# Patient Record
Sex: Female | Born: 1960 | Race: White | Hispanic: No | Marital: Married | Smoking: Never smoker
Health system: Southern US, Community
[De-identification: ages and names within clinical notes are randomized; demographics above are authoritative.]

## PROBLEM LIST (undated history)

## (undated) DIAGNOSIS — G43909 Migraine, unspecified, not intractable, without status migrainosus: Secondary | ICD-10-CM

## (undated) DIAGNOSIS — M1990 Unspecified osteoarthritis, unspecified site: Secondary | ICD-10-CM

## (undated) DIAGNOSIS — T8859XA Other complications of anesthesia, initial encounter: Secondary | ICD-10-CM

## (undated) DIAGNOSIS — E669 Obesity, unspecified: Secondary | ICD-10-CM

## (undated) DIAGNOSIS — Z8489 Family history of other specified conditions: Secondary | ICD-10-CM

## (undated) DIAGNOSIS — D649 Anemia, unspecified: Secondary | ICD-10-CM

## (undated) DIAGNOSIS — J40 Bronchitis, not specified as acute or chronic: Secondary | ICD-10-CM

## (undated) DIAGNOSIS — N83209 Unspecified ovarian cyst, unspecified side: Secondary | ICD-10-CM

## (undated) DIAGNOSIS — K449 Diaphragmatic hernia without obstruction or gangrene: Secondary | ICD-10-CM

## (undated) DIAGNOSIS — T4145XA Adverse effect of unspecified anesthetic, initial encounter: Secondary | ICD-10-CM

## (undated) HISTORY — PX: OTHER SURGICAL HISTORY: SHX169

## (undated) HISTORY — DX: Anemia, unspecified: D64.9

## (undated) HISTORY — DX: Diaphragmatic hernia without obstruction or gangrene: K44.9

## (undated) HISTORY — DX: Migraine, unspecified, not intractable, without status migrainosus: G43.909

## (undated) HISTORY — PX: ABDOMINOPLASTY/PANNICULECTOMY: SHX5578

## (undated) HISTORY — DX: Bronchitis, not specified as acute or chronic: J40

## (undated) HISTORY — DX: Unspecified osteoarthritis, unspecified site: M19.90

## (undated) HISTORY — DX: Unspecified ovarian cyst, unspecified side: N83.209

## (undated) HISTORY — DX: Obesity, unspecified: E66.9

---

## 1996-10-18 HISTORY — PX: BREAST SURGERY: SHX581

## 1996-10-18 HISTORY — PX: REDUCTION MAMMAPLASTY: SUR839

## 1997-10-18 HISTORY — PX: NASAL SINUS SURGERY: SHX719

## 2007-08-25 ENCOUNTER — Ambulatory Visit: Payer: Self-pay | Admitting: Obstetrics and Gynecology

## 2007-08-25 ENCOUNTER — Encounter: Payer: Self-pay | Admitting: Obstetrics and Gynecology

## 2007-08-31 ENCOUNTER — Encounter: Admission: RE | Admit: 2007-08-31 | Discharge: 2007-08-31 | Payer: Self-pay | Admitting: Obstetrics & Gynecology

## 2008-08-15 ENCOUNTER — Ambulatory Visit: Payer: Self-pay | Admitting: Family Medicine

## 2008-08-15 DIAGNOSIS — M069 Rheumatoid arthritis, unspecified: Secondary | ICD-10-CM | POA: Insufficient documentation

## 2009-01-20 ENCOUNTER — Ambulatory Visit: Payer: Self-pay | Admitting: Family Medicine

## 2009-01-20 LAB — CONVERTED CEMR LAB: Rapid Strep: POSITIVE

## 2009-06-27 ENCOUNTER — Ambulatory Visit: Payer: Self-pay | Admitting: Family Medicine

## 2009-06-27 DIAGNOSIS — J069 Acute upper respiratory infection, unspecified: Secondary | ICD-10-CM | POA: Insufficient documentation

## 2009-10-05 ENCOUNTER — Ambulatory Visit: Payer: Self-pay | Admitting: Internal Medicine

## 2009-10-05 DIAGNOSIS — H6690 Otitis media, unspecified, unspecified ear: Secondary | ICD-10-CM | POA: Insufficient documentation

## 2009-11-21 ENCOUNTER — Ambulatory Visit (HOSPITAL_COMMUNITY): Admission: RE | Admit: 2009-11-21 | Discharge: 2009-11-21 | Payer: Self-pay | Admitting: Surgery

## 2009-12-05 ENCOUNTER — Ambulatory Visit (HOSPITAL_COMMUNITY): Admission: RE | Admit: 2009-12-05 | Discharge: 2009-12-05 | Payer: Self-pay | Admitting: Surgery

## 2009-12-15 ENCOUNTER — Encounter: Admission: RE | Admit: 2009-12-15 | Discharge: 2010-03-15 | Payer: Self-pay | Admitting: Surgery

## 2010-02-15 HISTORY — PX: ROUX-EN-Y PROCEDURE: SUR1287

## 2010-02-16 ENCOUNTER — Inpatient Hospital Stay (HOSPITAL_COMMUNITY): Admission: RE | Admit: 2010-02-16 | Discharge: 2010-02-19 | Payer: Self-pay | Admitting: Surgery

## 2010-02-17 ENCOUNTER — Ambulatory Visit: Payer: Self-pay | Admitting: Vascular Surgery

## 2010-02-17 ENCOUNTER — Encounter (INDEPENDENT_AMBULATORY_CARE_PROVIDER_SITE_OTHER): Payer: Self-pay | Admitting: Surgery

## 2010-03-31 ENCOUNTER — Encounter: Admission: RE | Admit: 2010-03-31 | Discharge: 2010-06-29 | Payer: Self-pay | Admitting: Surgery

## 2010-08-13 ENCOUNTER — Encounter
Admission: RE | Admit: 2010-08-13 | Discharge: 2010-08-13 | Payer: Self-pay | Source: Home / Self Care | Attending: Surgery | Admitting: Surgery

## 2010-10-18 HISTORY — PX: OTHER SURGICAL HISTORY: SHX169

## 2011-01-05 LAB — DIFFERENTIAL
Basophils Absolute: 0 10*3/uL (ref 0.0–0.1)
Basophils Relative: 0 % (ref 0–1)
Basophils Relative: 0 % (ref 0–1)
Basophils Relative: 1 % (ref 0–1)
Eosinophils Absolute: 0 10*3/uL (ref 0.0–0.7)
Eosinophils Absolute: 0.1 10*3/uL (ref 0.0–0.7)
Eosinophils Relative: 0 % (ref 0–5)
Eosinophils Relative: 0 % (ref 0–5)
Lymphs Abs: 1.5 10*3/uL (ref 0.7–4.0)
Lymphs Abs: 2.5 10*3/uL (ref 0.7–4.0)
Monocytes Absolute: 0.9 10*3/uL (ref 0.1–1.0)
Monocytes Absolute: 0.5 10*3/uL (ref 0.1–1.0)
Monocytes Relative: 7 % (ref 3–12)
Neutrophils Relative %: 76 % (ref 43–77)

## 2011-01-05 LAB — CBC
HCT: 37 % (ref 36.0–46.0)
HCT: 37.7 % (ref 36.0–46.0)
Hemoglobin: 11.9 g/dL — ABNORMAL LOW (ref 12.0–15.0)
Hemoglobin: 12.3 g/dL (ref 12.0–15.0)
MCHC: 32.2 g/dL (ref 30.0–36.0)
MCV: 84.4 fL (ref 78.0–100.0)
Platelets: 275 10*3/uL (ref 150–400)
Platelets: 295 10*3/uL (ref 150–400)
RBC: 4.39 MIL/uL (ref 3.87–5.11)
RDW: 16.6 % — ABNORMAL HIGH (ref 11.5–15.5)
RDW: 17.5 % — ABNORMAL HIGH (ref 11.5–15.5)
WBC: 10.1 10*3/uL (ref 4.0–10.5)
WBC: 9.4 10*3/uL (ref 4.0–10.5)
WBC: 7.9 10*3/uL (ref 4.0–10.5)

## 2011-01-05 LAB — COMPREHENSIVE METABOLIC PANEL
ALT: 22 U/L (ref 0–35)
CO2: 30 mEq/L (ref 19–32)
Calcium: 9.2 mg/dL (ref 8.4–10.5)
Chloride: 104 mEq/L (ref 96–112)
Creatinine, Ser: 0.72 mg/dL (ref 0.4–1.2)
Glucose, Bld: 93 mg/dL (ref 70–99)
Potassium: 4.2 mEq/L (ref 3.5–5.1)
Total Bilirubin: 0.5 mg/dL (ref 0.3–1.2)

## 2011-01-05 LAB — PREGNANCY, URINE: Preg Test, Ur: NEGATIVE

## 2011-01-05 LAB — HEMOGLOBIN AND HEMATOCRIT, BLOOD
HCT: 37.6 % (ref 36.0–46.0)
Hemoglobin: 11.9 g/dL — ABNORMAL LOW (ref 12.0–15.0)

## 2011-01-05 LAB — BASIC METABOLIC PANEL
Calcium: 8.4 mg/dL (ref 8.4–10.5)
Sodium: 138 mEq/L (ref 135–145)

## 2011-02-23 ENCOUNTER — Other Ambulatory Visit: Payer: Self-pay | Admitting: Obstetrics & Gynecology

## 2011-02-23 ENCOUNTER — Ambulatory Visit (INDEPENDENT_AMBULATORY_CARE_PROVIDER_SITE_OTHER): Payer: 59 | Admitting: Obstetrics & Gynecology

## 2011-02-23 DIAGNOSIS — N949 Unspecified condition associated with female genital organs and menstrual cycle: Secondary | ICD-10-CM

## 2011-02-23 DIAGNOSIS — N920 Excessive and frequent menstruation with regular cycle: Secondary | ICD-10-CM

## 2011-02-23 DIAGNOSIS — N938 Other specified abnormal uterine and vaginal bleeding: Secondary | ICD-10-CM

## 2011-02-26 ENCOUNTER — Ambulatory Visit
Admission: RE | Admit: 2011-02-26 | Discharge: 2011-02-26 | Disposition: A | Discharge status: Home / Self Care | Payer: 59 | Source: Ambulatory Visit | Attending: Obstetrics & Gynecology | Admitting: Obstetrics & Gynecology

## 2011-02-26 ENCOUNTER — Other Ambulatory Visit: Payer: Self-pay | Admitting: Obstetrics & Gynecology

## 2011-02-26 DIAGNOSIS — N6320 Unspecified lump in the left breast, unspecified quadrant: Secondary | ICD-10-CM

## 2011-02-26 DIAGNOSIS — Z1231 Encounter for screening mammogram for malignant neoplasm of breast: Secondary | ICD-10-CM

## 2011-02-26 DIAGNOSIS — N920 Excessive and frequent menstruation with regular cycle: Secondary | ICD-10-CM

## 2011-02-26 DIAGNOSIS — N938 Other specified abnormal uterine and vaginal bleeding: Secondary | ICD-10-CM

## 2011-02-26 NOTE — Assessment & Plan Note (Signed)
NAME:  Rose Long, Rose Long NO.:  [id number]  MEDICAL RECORD NO.:  [id number]           PATIENT TYPE:  LOCATION:  [facility] [place]           FACILITY:  PHYSICIAN:  Allie Bossier, MD        DATE OF BIRTH:  [birth year]  DATE OF SERVICE:  02/23/2011                                 CLINIC NOTE  [name] is a 50 year old single white gravida 0 who comes here with the complaint of dysfunctional uterine bleeding/menorrhagia.  She says that in general her periods last 7 days.  They are so heavy that she has to wear a pad and tampon at the same time and has to change her pad and her tampon every 1 hour.  Her hemoglobin has consistently been low, most recently it was 8.8.  She says that for the last 6 weeks, however, her period has been nonstop.  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  She has a history of osteoarthritis and anemia as mentioned above.  REVIEW OF SYSTEMS:  She has had "night sweats" for years.  She is dating a man for the last 4 months that has had a vasectomy and they are certainly sexually active.  Her Pap smear was last done in November 2008 and was negative.  She has never had any abnormal Pap smears.  Her last mammogram was in 2008.  FAMILY HISTORY:  Negative for breast, GYN, and colon malignancies.  PAST SURGICAL HISTORY:  As above plus nasal surgery and bilateral breast reduction.  SOCIAL HISTORY:  She drinks alcohol on a very rare occasion.  ALLERGIES:  No latex allergies.  No drug allergies.  MEDICATIONS: 1. Iron 325 mg b.i.d. 2. Multivitamin with vitamin D. 3. Calcium citrate. 4. Vitamin B12. 5. Tylenol on a p.r.n. basis.  PHYSICAL EXAMINATION:  HEART:  Regular rate and rhythm. LUNGS:  Clear to auscultation bilaterally. ABDOMEN:  Benign. GENITOURINARY:  External genitalia, no lesions.  Cervix nulliparous. She is having her period now.  Bimanual exam  reveals an 8-week size mobile uterus consistent with fibroids.  Her adnexa are nontender and nonenlarged.  An endometrial biopsy was obtained and there was a large amount of tissue.  ASSESSMENT/PLAN:  Dysfunctional uterine bleeding and hot flashes.  I am going to check an [facility].  Please note, a TSH was recently normal.  I am going to order a GYN ultrasound to determine the position of the fibroids.  After a long discussion of options including Mirena as well as Depo-Provera, she would like the Essure tubal sterilization along with a NovaSure ablation.  In the interim before this can be done, I will give her Depo-Provera shot, schedule mammogram and GYN ultrasound and a Pap smear.     Allie Bossier, MD    MCD/MEDQ  D:  02/23/2011  T:  02/24/2011  Job:  [id number]

## 2011-03-01 ENCOUNTER — Encounter: Payer: 59 | Attending: Surgery | Admitting: *Deleted

## 2011-03-01 DIAGNOSIS — Z09 Encounter for follow-up examination after completed treatment for conditions other than malignant neoplasm: Secondary | ICD-10-CM | POA: Insufficient documentation

## 2011-03-01 DIAGNOSIS — Z9884 Bariatric surgery status: Secondary | ICD-10-CM | POA: Insufficient documentation

## 2011-03-01 DIAGNOSIS — Z713 Dietary counseling and surveillance: Secondary | ICD-10-CM | POA: Insufficient documentation

## 2011-03-02 NOTE — Assessment & Plan Note (Signed)
NAME:  Rose Long, Rose Long NO.:  [id number]   MEDICAL RECORD NO.:  [id number]          PATIENT TYPE:  POB   LOCATION:  [facility] [place]         FACILITY:  [facility]   PHYSICIAN:  Caren Griffins, CNM       DATE OF BIRTH:  Jun 21, 1961   DATE OF SERVICE:                                  CLINIC NOTE   REASON FOR VISIT:  Annual exam and Pap smear.   HISTORY:  This is a 50 year old nulliparous white female who has not had  a Pap smear for 10 years and would like one.  She has never had an  abnormal Pap. She is a self referral and does have a primary M.D. who is  Merrilyn Puma, family practice.  Her biggest health concern is her  obesity for which she has struggled with since childhood.  Has been to  different weight clinics and has used appetite suppressants and other  strategies for weight loss without much success.  She does not do  regular exercising.  She also is symptomatic especially in the lower  extremities with arthritis, stating that she was diagnosed with  rheumatoid arthritis in childhood.  However, copes with joint pain using  no medications.  She has never had a mammogram.   ALLERGIES:  None.   IMMUNIZATIONS:  The usual childhood, including influenza which she gets  at her work place.   MENSTRUAL HISTORY:  10 x 28 x 7, LMP was 10/19 and was normal, no  intermenstrual bleeding, states her flow is heavy and she has moderate  dysmenorrhea.   CONTRACEPTIVE HISTORY:  None. She is virginal.   GYN HISTORY:  As above. No STDs.  Does state she had a negative occult  blood test for the stool 1-2 years ago.   SURGERIES:  Significant for bilateral breast reduction in 1990.  Sinus  surgery in 1992.  Furuncles removed from left axilla in 2002.   FAMILY HISTORY:  Significant for both parents with hypertension.  MGF  heart attack.  PGM lung cancer.   PAST MEDICAL HISTORY:  Arthritis as above.  Denies other significant  health problems.   SOCIAL HISTORY:  Lives  alone.  Works full time in this bleeding with  [employer].  Non smoker. Drinks rarely.  Caffeine is also  occasional use.  Negative substance abuse or physical abuse.   REVIEW OF SYSTEMS:  Is currently positive for occasional hot flashes at  night which do not interfere much with her sleep.  She has had a recent  weight gain and some tension headaches recently.  Otherwise in the past  she feels like she has had easy bruising, she has been anemic and  fatigued in the past. Has lower extremity muscle aching and joint pains  which are chronic.  She has had vaginal itching, none at present.   PHYSICAL EXAMINATION:  GENERAL:  A pleasant obese white female in no  acute distress.  HEENT:  Good dentition.  NECK:  Thyroid smooth, not enlarged.  LUNGS:  Clear to auscultation bilaterally.  HEART:  RRR without murmur.  BREASTS:  Scars present from breast reduction surgery. Breasts  symmetric, no discreet masses. No lymphadenopathy. Nipples erect with  no  discharge.  ABDOMEN:  Protuberant, no organomegaly, masses or tenderness.  PELVIC:  NEFG. Vagina pink with diminished ruga. Cervix slightly  everted, no lesions, mucoid discharge.  Bimanual  - uterus NSP, adnexa  without tenderness or masses, ovaries non palpable.  RECTAL:  Tiny hemorrhoidal tag at 12 o'clock.  EXTREMITIES:  Significant for 1+ bilateral pitting edema.   ASSESSMENT:  1. Normal GYN exam.  2. Obesity.  3. Arthritis.  4. Probable anemia.   PLAN:  Health care maintenance - discussed vitamins including calcium  and D3.  Will hold off on iron supplementation as this has caused  constipation in the past.  Pap smear is sent. She is set up for her  first mammogram.  She will return fasting for blood work including lipid  panel, CBC, TSH. She will be getting a fasting blood sugar through the  [facility] health program.  Discussed at length strategies for weight  loss and urged to increase exercise, continue using seat  belt.           ______________________________  Caren Griffins, CNM     DP/MEDQ  D:  08/25/2007  T:  08/25/2007  Job:  [id number]

## 2011-03-09 ENCOUNTER — Ambulatory Visit: Payer: 59 | Admitting: Obstetrics & Gynecology

## 2011-03-16 ENCOUNTER — Ambulatory Visit (INDEPENDENT_AMBULATORY_CARE_PROVIDER_SITE_OTHER): Payer: 59 | Admitting: Obstetrics & Gynecology

## 2011-03-16 ENCOUNTER — Other Ambulatory Visit: Payer: Self-pay | Admitting: Obstetrics & Gynecology

## 2011-03-16 DIAGNOSIS — Z01419 Encounter for gynecological examination (general) (routine) without abnormal findings: Secondary | ICD-10-CM

## 2011-03-16 DIAGNOSIS — N841 Polyp of cervix uteri: Secondary | ICD-10-CM

## 2011-03-16 DIAGNOSIS — Z113 Encounter for screening for infections with a predominantly sexual mode of transmission: Secondary | ICD-10-CM

## 2011-03-16 DIAGNOSIS — Z1272 Encounter for screening for malignant neoplasm of vagina: Secondary | ICD-10-CM

## 2011-03-17 NOTE — Assessment & Plan Note (Signed)
NAME:  Rose Long, Rose Long NO.:  [id number]  MEDICAL RECORD NO.:  [id number]           PATIENT TYPE:  LOCATION:  [facility] [place]           FACILITY:  PHYSICIAN:  Elsie Lincoln, MD      DATE OF BIRTH:  06/15/1961  DATE OF SERVICE:  03/16/2011                                 CLINIC NOTE  The patient is a 50 year old nulliparous female, who presents for surgical assessment for bleeding.  The patient had been seen earlier and started on Depo and this has helped her bleeding, necessitated bridge to the permanent solution.  The patient also has history of severe iron deficiency anemia.  She is unable to absorb iron secondary to her gastric bypass.  The patient considered doing a tubal ligation; however, upon reconsidering, she would rather not do that at this time.  She is in a monogamous relationship with a man who has had a vasectomy and she is going to stick with condoms as she changes partners.  We did discuss the Essure.  The patient did have a transvaginal ultrasound recently, it showed a uterus that measures approximately 10 cm.  There was intramural fibroid in the right fundal region measuring 3.2 x 2.9 x 2.9 and a second intramural fibroid in the right posterior uterine body measuring 1 cm.  Her endometrium was normal.  Her ovaries were normal.  The right ovary had a simple ovarian cyst measuring 3.7    3.4.  The radiologist called the cyst likely physiologic, we can follow this up at a later ultrasound.  PAST MEDICAL HISTORY: 1. Gastric bypass for obesity. 2. Osteoarthritis. 3. Anemia.  PAST SURGICAL HISTORY: 1. Nasal surgery. 2. Bilateral breast reduction. 3. A gastric bypass.  FAMILY HISTORY:  Negative for breast, GYN, or colon malignancy.  SOCIAL HISTORY:  Rare alcoholic beverage.  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  Her sister is Dicky Doe.  ALLERGIES:  No latex and no drug allergies.  MEDICATIONS: 1. Iron  325 mg twice a day. 2. Multivitamin with D. 3. Calcium citrate. 4. B12. 5. Tylenol p.r.n. 6. Depo-Provera.  REVIEW OF SYSTEMS:  Negative.  PHYSICAL EXAMINATION:  VITAL SIGNS:  Pulse 64, blood pressure 119/69, weight 191, height 66.5 Inches. GENERAL:  Well nourished, well developed, apparent distress. HEENT:  Normocephalic, atraumatic. NECK:  No masses, no thyromegaly. LUNGS:  Clear to auscultation bilaterally. HEART:  Regular rate and rhythm. BREASTS:  There is a thickening of tissue about the left areola.  There is a similar on the right, but the left is more pronounced.  This could be to the hidradenitis suppurativa or a scar tissue from breast reduction.  However, the left being larger than the right and this is a new finding for the patient as she does breast exams and will get a diagnostic mammogram ultrasound of the left breast.  There is no nipple discharge, no lymphadenopathy.  There is evidence of hidradenitis suppurativa in the axilla. ABDOMEN:  Soft, nontender.  No organomegaly.  No hernia. GENITALIA:  Tanner V.  Vagina pink, normal rugae.  Cervix severely deviated to the left, very difficult to visualize the whole cervix. There is an endocervical polyp extruding from the os.  Uterus;  no masses, nontender.  Adnexa; no masses, nontender; however, I cannot feel the ovarian cyst that was seen on the right.  ASSESSMENT AND PLAN:  A 50 year old female for a well-woman examination to prepare for surgery. 1. Pap smear plus human papilloma virus. 2. Diagnostic mammogram of the left breast. 3. Endocervical polyp removed with the second procedure.  After     informed consent was obtained, the patient was placed in dorsal     lithotomy position.  Speculum was placed into the vagina.  The     cervix was brought into view with the help of the tenaculum.  90%     of the polyp was removed.  Due to the patient's discomfort, we did     not continue on with this procedure given that  we are going to the     operating room in less than a month.  The patient understands that     we will also remove this polyp at the time of the surgery. 4. The patient consented for hydrothermal ablation and a polypectomy.     Risks include, but not limited to bleeding, infection, damage to     the back of the uterus, and a burn to the vagina. 5. Tubal ligation will not be done at this time due to the patient's     choice. 6. Endometrial biopsy was negative. 7. The patient needs an iron transfusion.  We will schedule this at     [facility].  Her hematologist was at [facility], so she would like to     switch over to a hematologist at our hospital due to benefits.  We     will try to schedule this Thursday after 2 p.m. or next Friday. 8. Her surgery be scheduled at the end of a work week in the next     month.          ______________________________ Elsie Lincoln, MD    KL/MEDQ  D:  03/16/2011  T:  03/17/2011  Job:  [id number]

## 2011-03-18 ENCOUNTER — Encounter: Payer: 59 | Admitting: Oncology

## 2011-03-23 ENCOUNTER — Ambulatory Visit: Payer: 59

## 2011-03-24 ENCOUNTER — Other Ambulatory Visit: Payer: Self-pay | Admitting: Oncology

## 2011-03-24 ENCOUNTER — Encounter (HOSPITAL_BASED_OUTPATIENT_CLINIC_OR_DEPARTMENT_OTHER): Payer: 59 | Admitting: Oncology

## 2011-03-24 DIAGNOSIS — D509 Iron deficiency anemia, unspecified: Secondary | ICD-10-CM

## 2011-03-24 LAB — CBC WITH DIFFERENTIAL/PLATELET
BASO%: 0.9 % (ref 0.0–2.0)
Eosinophils Absolute: 0.1 10*3/uL (ref 0.0–0.5)
HCT: 31.9 % — ABNORMAL LOW (ref 34.8–46.6)
LYMPH%: 33.6 % (ref 14.0–49.7)
MCHC: 30.1 g/dL — ABNORMAL LOW (ref 31.5–36.0)
MONO#: 0.5 10*3/uL (ref 0.1–0.9)
NEUT#: 4.1 10*3/uL (ref 1.5–6.5)
Platelets: 371 10*3/uL (ref 145–400)
RBC: 4.71 10*6/uL (ref 3.70–5.45)
WBC: 7 10*3/uL (ref 3.9–10.3)
lymph#: 2.4 10*3/uL (ref 0.9–3.3)
nRBC: 0 % (ref 0–0)

## 2011-03-26 ENCOUNTER — Encounter (HOSPITAL_BASED_OUTPATIENT_CLINIC_OR_DEPARTMENT_OTHER): Payer: 59 | Admitting: Oncology

## 2011-03-26 ENCOUNTER — Ambulatory Visit
Admission: RE | Admit: 2011-03-26 | Discharge: 2011-03-26 | Disposition: A | Discharge status: Home / Self Care | Payer: 59 | Source: Ambulatory Visit | Attending: Obstetrics & Gynecology | Admitting: Obstetrics & Gynecology

## 2011-03-26 ENCOUNTER — Other Ambulatory Visit: Payer: Self-pay | Admitting: Obstetrics & Gynecology

## 2011-03-26 DIAGNOSIS — N6320 Unspecified lump in the left breast, unspecified quadrant: Secondary | ICD-10-CM

## 2011-03-26 DIAGNOSIS — D509 Iron deficiency anemia, unspecified: Secondary | ICD-10-CM

## 2011-03-26 LAB — IRON AND TIBC

## 2011-03-26 LAB — FERRITIN: Ferritin: 3 ng/mL — ABNORMAL LOW (ref 10–291)

## 2011-03-26 LAB — COMPREHENSIVE METABOLIC PANEL
ALT: 12 U/L (ref 0–35)
AST: 14 U/L (ref 0–37)
Alkaline Phosphatase: 57 U/L (ref 39–117)
Creatinine, Ser: 0.7 mg/dL (ref 0.50–1.10)
Sodium: 140 mEq/L (ref 135–145)
Total Bilirubin: 0.2 mg/dL — ABNORMAL LOW (ref 0.3–1.2)
Total Protein: 6.9 g/dL (ref 6.0–8.3)

## 2011-03-26 LAB — HEMOGLOBINOPATHY EVALUATION: Hgb A: 97.9 % — ABNORMAL HIGH (ref 96.8–97.8)

## 2011-04-02 ENCOUNTER — Encounter (HOSPITAL_BASED_OUTPATIENT_CLINIC_OR_DEPARTMENT_OTHER): Payer: 59 | Admitting: Oncology

## 2011-04-02 DIAGNOSIS — D509 Iron deficiency anemia, unspecified: Secondary | ICD-10-CM

## 2011-04-08 ENCOUNTER — Ambulatory Visit (HOSPITAL_COMMUNITY)
Admission: EM | Admit: 2011-04-08 | Discharge: 2011-04-08 | Disposition: A | Discharge status: Home / Self Care | Payer: 59 | Source: Ambulatory Visit | Attending: Obstetrics & Gynecology | Admitting: Obstetrics & Gynecology

## 2011-04-08 ENCOUNTER — Other Ambulatory Visit: Payer: Self-pay | Admitting: Obstetrics & Gynecology

## 2011-04-08 DIAGNOSIS — N840 Polyp of corpus uteri: Secondary | ICD-10-CM

## 2011-04-08 DIAGNOSIS — N841 Polyp of cervix uteri: Secondary | ICD-10-CM

## 2011-04-08 DIAGNOSIS — N920 Excessive and frequent menstruation with regular cycle: Secondary | ICD-10-CM

## 2011-04-08 LAB — TYPE AND SCREEN: Antibody Screen: NEGATIVE

## 2011-04-08 LAB — CBC
HCT: 34.2 % — ABNORMAL LOW (ref 36.0–46.0)
Hemoglobin: 10.4 g/dL — ABNORMAL LOW (ref 12.0–15.0)
MCH: 22.3 pg — ABNORMAL LOW (ref 26.0–34.0)
MCHC: 30.4 g/dL (ref 30.0–36.0)
MCV: 73.4 fL — ABNORMAL LOW (ref 78.0–100.0)
Platelets: 307 K/uL (ref 150–400)
RBC: 4.66 MIL/uL (ref 3.87–5.11)
RDW: 23.7 % — ABNORMAL HIGH (ref 11.5–15.5)
WBC: 6.4 K/uL (ref 4.0–10.5)

## 2011-04-08 LAB — PREGNANCY, URINE: Preg Test, Ur: NEGATIVE

## 2011-04-08 LAB — ABO/RH: ABO/RH(D): O POS

## 2011-04-25 NOTE — Op Note (Signed)
  NAME:  Rose Long, KROEGER NO.:  [id number]  MEDICAL RECORD NO.:  [id number]  LOCATION:  [facility]                          FACILITY:  [facility]  PHYSICIAN:  Lesly Dukes, M.D. DATE OF BIRTH:  Dec 12, 1960  DATE OF PROCEDURE:  04/08/2011 DATE OF DISCHARGE:                              OPERATIVE REPORT   PREOPERATIVE DIAGNOSIS:  A 50 year old female with menorrhagia and endocervical polyp.  POSTOPERATIVE DIAGNOSIS:  A 50 year old female with menorrhagia, hysteroscopic-confirmed endometrial polyp and endocervical polyp.  PROCEDURES: 1. D and C. 2. Polypectomy of the endometrium. 3. Hydrothermal ablation. 4. Endocervical polypectomy.  SURGEON:  Lesly Dukes, MD  ANESTHESIA:  General.  FINDINGS:  Normal-sized uterus, 2 endometrial polyps, 1 endocervical polyp.  SPECIMEN:  Endometrial curettings, endometrial polyps, and cervical polyp to Pathology.  COMPLICATIONS:  None.  DESCRIPTION OF PROCEDURE:  After informed consent was obtained, the patient was taken to the operating room where general anesthesia was induced.  The patient was placed in the dorsal lithotomy position and prepared with a normal sterile fashion.  A time-out was performed.  Her bladder was emptied with a catheter.  A bimanual was performed with the above findings.  A bivalve speculum was placed into the patient's vagina and the cervix brought into view.  The anterior lip of the cervix was grasped with single-tooth tenaculum.  The cervical os was gently dilated to 8.5 mm with half-size Hegar dilators.  The hysteroscope was introduced.  Hysteroscopy was performed with the above findings. Hydrothermal ablation was conducted.  The uterine integrity check was normal. There was no extravasation of fluid from the cervix.  The water was gently heated to 80 degrees Celsius and we proceeded with a 10-minute burn, there was a 1-minute cool down.  At the end of procedure, hysteroscope was removed.  D and  C was performed and all polyps were removed.  The hysteroscope was reintroduced and noted that all polyps had been successfully removed from the uterus.  All instruments were removed from the patient's vagina.  There was good hemostasis from the cervix.  The patient tolerated the procedure well. Sponge, lap, instrument, and needle counts were correct x2, and the patient went to recovery room in stable condition.     Lesly Dukes, M.D.     [name]  D:  04/08/2011  T:  04/09/2011  Job:  [id number]  Electronically Signed by Elsie Lincoln M.D. on 04/25/2011 09:06:14 PM

## 2011-04-27 ENCOUNTER — Encounter: Payer: Self-pay | Admitting: Obstetrics & Gynecology

## 2011-04-27 ENCOUNTER — Ambulatory Visit (INDEPENDENT_AMBULATORY_CARE_PROVIDER_SITE_OTHER): Payer: 59 | Admitting: Obstetrics & Gynecology

## 2011-04-27 DIAGNOSIS — N83209 Unspecified ovarian cyst, unspecified side: Secondary | ICD-10-CM

## 2011-04-27 DIAGNOSIS — L0390 Cellulitis, unspecified: Secondary | ICD-10-CM

## 2011-04-27 MED ORDER — SULFAMETHOXAZOLE-TRIMETHOPRIM 800-160 MG PO TABS: 1.0000 | ORAL_TABLET | Freq: Two times a day (BID) | ORAL | Status: AC

## 2011-04-27 NOTE — Progress Notes (Signed)
  S;  Pt presents for follow up 3 weeks after endometrial ablation.  Having mild vaginal d/c.  Pt also has some "bumps" on abdomen to be evaluated.  O: AFVSS Heent:  NCAT Chest:  nml mvmt Abd:  Soft, NT, ND;  Mild caruncle, resolving cellulitis just under umbilicus.  Lesion above pubic bone and hair line--?sebaceous cyst vs. Old scarred cellulitis. Vulvar:  Tanner 3 no lesion Vagin:  Pink, nml rugaue, minimal d/c Cervix:  Closed, no polyp seen  A/p:   1.  S/p ablation.  No problems.   2.  R/u rt ovarian cyst later this month with [place] 3.  Bactrim DS for cellulitis inferior to umbilicus. 4.  RTC prn

## 2011-04-27 NOTE — Patient Instructions (Signed)
Vaginal discharge would decrease over next few weeks.  You may have a period.  However, the ablation may cause you to not have monthly menses in the future and this is normal.  Need f/u US for the ovarian cyst.  Take antibiotics as prescribed.

## 2011-05-03 ENCOUNTER — Inpatient Hospital Stay (INDEPENDENT_AMBULATORY_CARE_PROVIDER_SITE_OTHER)
Admission: RE | Admit: 2011-05-03 | Discharge: 2011-05-03 | Disposition: A | Discharge status: Home / Self Care | Payer: 59 | Source: Ambulatory Visit | Attending: Emergency Medicine | Admitting: Emergency Medicine

## 2011-05-03 ENCOUNTER — Encounter: Payer: Self-pay | Admitting: Emergency Medicine

## 2011-05-03 DIAGNOSIS — R070 Pain in throat: Secondary | ICD-10-CM | POA: Insufficient documentation

## 2011-05-03 DIAGNOSIS — L259 Unspecified contact dermatitis, unspecified cause: Secondary | ICD-10-CM | POA: Insufficient documentation

## 2011-05-04 ENCOUNTER — Telehealth (INDEPENDENT_AMBULATORY_CARE_PROVIDER_SITE_OTHER): Payer: Self-pay | Admitting: Emergency Medicine

## 2011-05-04 ENCOUNTER — Encounter: Payer: Self-pay | Admitting: Emergency Medicine

## 2011-05-04 DIAGNOSIS — L259 Unspecified contact dermatitis, unspecified cause: Secondary | ICD-10-CM

## 2011-05-05 ENCOUNTER — Telehealth (INDEPENDENT_AMBULATORY_CARE_PROVIDER_SITE_OTHER): Payer: Self-pay | Admitting: Emergency Medicine

## 2011-05-21 ENCOUNTER — Other Ambulatory Visit: Payer: Self-pay | Admitting: Oncology

## 2011-05-21 ENCOUNTER — Encounter (HOSPITAL_BASED_OUTPATIENT_CLINIC_OR_DEPARTMENT_OTHER): Payer: 59 | Admitting: Oncology

## 2011-05-21 DIAGNOSIS — D509 Iron deficiency anemia, unspecified: Secondary | ICD-10-CM

## 2011-05-21 LAB — CBC WITH DIFFERENTIAL/PLATELET
BASO%: 0.7 % (ref 0.0–2.0)
EOS%: 1.5 % (ref 0.0–7.0)
Eosinophils Absolute: 0.2 10*3/uL (ref 0.0–0.5)
MCH: 26.7 pg (ref 25.1–34.0)
MCV: 79.8 fL (ref 79.5–101.0)
MONO%: 5.6 % (ref 0.0–14.0)
NEUT#: 7.8 10*3/uL — ABNORMAL HIGH (ref 1.5–6.5)
RBC: 4.58 10*6/uL (ref 3.70–5.45)
RDW: 28.8 % — ABNORMAL HIGH (ref 11.2–14.5)

## 2011-05-21 LAB — IRON AND TIBC: Iron: 24 ug/dL — ABNORMAL LOW (ref 42–145)

## 2011-05-21 LAB — FERRITIN: Ferritin: 73 ng/mL (ref 10–291)

## 2011-08-06 DIAGNOSIS — G4700 Insomnia, unspecified: Secondary | ICD-10-CM | POA: Insufficient documentation

## 2011-08-06 DIAGNOSIS — E669 Obesity, unspecified: Secondary | ICD-10-CM | POA: Insufficient documentation

## 2011-08-24 ENCOUNTER — Telehealth (INDEPENDENT_AMBULATORY_CARE_PROVIDER_SITE_OTHER): Payer: Self-pay | Admitting: Surgery

## 2011-08-24 NOTE — Telephone Encounter (Signed)
08/24/11 mailed recall notice to patient for bariatric surgery follow-up. Adv pt to call [facility] to schedule an appt. cef °

## 2011-09-20 NOTE — Progress Notes (Signed)
Vital Signs:  Patient profile:   50 year old female Temp:     100.3 degrees F oral  Vitals Entered By: Betti Cruz RN (May 04, 2011 9:29 AM) History of Present Illness History from: patient History of Present Illness: She returns today for worsening rash.  It has spread further onto her legs and face.  She is still itching a lot.  She has tried [employer] and started her Prednisone Rx this morning.  She says that she forgot to tell us yesterday that she is currently on an antibiotic (Bactrim?) for suspected MRSA abscess on abdomen.  She was also in a hot tub last week that she had to get out of because the chlorine was so strong.  No other sick contacts.    Plan New Medications/Changes: HYDROXYZINE HCL 25 MG TABS (HYDROXYZINE HCL) 1 by mouth q6 hrs as needed for itching  #24 x 0, 05/04/2011, Fidela Salisbury MD  New Orders: Solumedrol up to 125mg  [J2930] Admin of Therapeutic Inj  intramuscular or subcutaneous [96372] No Charge Patient Arrived (NCPA0) [NCPA0] Planning Comments:   Rx for Atarax.  Repeat the Solumedrol IM today and continue meds as directed.  Stop the Bactrim (allergic reaction is currently near the top of the differential) since the abscess site looks pretty good with no signs of active infection.  Also need to consider the hot tub chemicals as cause.  Cool compresses, cool showers, air conditioning.  Avoid any irritating substances.  Will call patient later today to see how she's doing.  If not improving, will need to get her to see dermatology.  Rapid strep yesterday was negative but can consider PCN-VK if new symptoms.  This doesn't appear to be erythema multiforme or erythema nodosum, SJS, TEN, or TSS.  However if continuing to worsen, need to consider a hypersensitivity syndrome.   The patient and/or caregiver has been counseled thoroughly with regard to medications prescribed including dosage, schedule, interactions, rationale for use, and possible side effects and  they verbalize understanding.  Diagnoses and expected course of recovery discussed and will return if not improved as expected or if the condition worsens. Patient and/or caregiver verbalized understanding.   Physical Exam General appearance: Mild distress from itching.  healthy-appearing and cooperative to examination.   Oral/Pharynx: no mucous membrane involvement Skin: Scattered red splotchy rash over her face (spares the eyes), abdomen (trunk and back), arms (spares the palms), and upper thighs.  So far has not reached the lower legs or feet.  No blisters or bullae, non palpable lesions, it is symmetric   Allergies: 1)  ! * Bee Stings  Physical Exam  General:  Mild distress from itching.  healthy-appearing and cooperative to examination.   Lungs:  Normal respiratory effort, chest expands symmetrically. Lungs are clear to auscultation, no crackles or wheezes. Heart:  Normal rate and regular rhythm. S1 and S2 normal without gallop, murmur, click, rub or other extra sounds. Skin:  Scattered red splotchy rash over her face (spares the eyes), abdomen (trunk and back), arms (spares the palms), and upper thighs.  So far has not reached the lower legs or feet. Psych:  Cognition and judgment appear intact. Alert and cooperative with normal attention span and concentration. No apparent delusions, illusions, hallucinations   Complete Medication List: 1)  Centrum Tabs (Multiple vitamins-minerals) 2)  Calcium Citrate 950 Mg Tabs (Calcium citrate) 3)  Vitamin B-12 100 Mcg Tabs (Cyanocobalamin) 4)  Calcium-vitamin D 250-125 Mg-unit Tabs (Calcium carbonate-vitamin d) 5)  Ambien 10  Mg Tabs (Zolpidem tartrate) 6)  Prednisone (pak) 10 Mg Tabs (Prednisone) .... Use as directed, 6 day pack, start on 04/04/11 7)  Hydroxyzine Hcl 25 Mg Tabs (Hydroxyzine hcl) .Marland Kitchen.. 1 by mouth q6 hrs as needed for itching  Other Orders: Solumedrol up to 125mg  (T5176) Admin of Therapeutic Inj  intramuscular or subcutaneous  (16073) No Charge Patient Arrived (NCPA0) (NCPA0) Prescriptions: HYDROXYZINE HCL 25 MG TABS (HYDROXYZINE HCL) 1 by mouth q6 hrs as needed for itching  #24 x 0   Entered and Authorized by:   Fidela Salisbury MD   Signed by:   Fidela Salisbury MD on 05/04/2011   Method used:   Print then Give to Patient   RxID:   [id number]    Medication Administration  Injection # 1:    Medication: Solumedrol up to 125mg     Diagnosis: CONTACT DERMATITIS&OTHER ECZEMA DUE UNSPEC CAUSE (ICD-692.9)    Route: IM    Site: LUOQ gluteus    Exp Date: 10/18/2013    Lot #: [id number]    Mfr: Pfizer    Given by: Georgiann Mccoy (May 04, 2011 9:40 AM)  Orders Added: 1)  Solumedrol up to 125mg  [J2930] 2)  Admin of Therapeutic Inj  intramuscular or subcutaneous [96372] 3)  No Charge Patient Arrived (NCPA0) [NCPA0]     Medication Administration  Injection # 1:    Medication: Solumedrol up to 125mg     Diagnosis: CONTACT DERMATITIS&OTHER ECZEMA DUE UNSPEC CAUSE (ICD-692.9)    Route: IM    Site: LUOQ gluteus    Exp Date: 10/18/2013    Lot #: [id number]    Mfr: Pfizer    Given by: Georgiann Mccoy (May 04, 2011 9:40 AM)  Orders Added: 1)  Solumedrol up to 125mg  [J2930] 2)  Admin of Therapeutic Inj  intramuscular or subcutaneous [96372] 3)  No Charge Patient Arrived (NCPA0) [NCPA0]

## 2011-09-20 NOTE — Progress Notes (Signed)
Summary: itching rm 2   Vital Signs:  Patient Profile:   50 Years Old Female CC:      itching x this AM Height:     66.5 inches Weight:      186 pounds O2 Sat:      100 % O2 treatment:    Room Air Temp:     99.3 degrees F oral Pulse rate:   72 / minute Resp:     18 per minute BP sitting:   127 / 74  (left arm) Cuff size:   regular  Vitals Entered By: Clemens Catholic LPN (May 03, 2011 1:24 PM)                  Updated Prior Medication List: CENTRUM  TABS (MULTIPLE VITAMINS-MINERALS)  CALCIUM CITRATE 950 MG TABS (CALCIUM CITRATE)  VITAMIN B-12 100 MCG TABS (CYANOCOBALAMIN)  CALCIUM-VITAMIN D 250-125 MG-UNIT TABS (CALCIUM CARBONATE-VITAMIN D)  AMBIEN 10 MG TABS (ZOLPIDEM TARTRATE)   Current Allergies (reviewed today): ! * BEE STINGSHistory of Present Illness History from: patient Chief Complaint: itching x this AM History of Present Illness: Itching since this morning and with a light rash on her upper extremities, face, and torso.  Lower body doesn't seem to be affected.  No new soaps, shampoos, detergents, clothes, pets, meds, strange foods.  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  She did have scabies in the past.  No F/C/N/V.  She does also c/o bilateral ear discomfort and mild URI symptoms this past weekend.    REVIEW OF SYSTEMS Constitutional Symptoms      Denies fever, chills, night sweats, weight loss, weight gain, and fatigue.  Eyes       Denies change in vision, eye pain, eye discharge, glasses, contact lenses, and eye surgery. Ear/Nose/Throat/Mouth       Denies hearing loss/aids, change in hearing, ear pain, ear discharge, dizziness, frequent runny nose, frequent nose bleeds, sinus problems, sore throat, hoarseness, and tooth pain or bleeding.  Respiratory       Denies dry cough, productive cough, wheezing, shortness of breath, asthma, bronchitis, and emphysema/COPD.  Cardiovascular       Denies murmurs, chest pain, and tires easily with exhertion.     Gastrointestinal       Denies stomach pain, nausea/vomiting, diarrhea, constipation, blood in bowel movements, and indigestion. Genitourniary       Denies painful urination, kidney stones, and loss of urinary control. Neurological       Denies paralysis, seizures, and fainting/blackouts. Musculoskeletal       Denies muscle pain, joint pain, joint stiffness, decreased range of motion, redness, swelling, muscle weakness, and gout.  Skin       Denies bruising, unusual mles/lumps or sores, and hair/skin or nail changes.  Psych       Denies mood changes, temper/anger issues, anxiety/stress, speech problems, depression, and sleep problems. Other Comments: pt c/o itching from her waist up x this AM. she also c/o having bumps in front of her ears bilaterally.   Past History:  Past Medical History: Rheumatoid arthritis chronic back problems Malabsorption problem  Past Surgical History: Bilateral Breast Reduction ablation rovxen inj  Family History: Reviewed history from 08/15/2008 and no changes required. Family History Hypertension -Mother Father Brother Family History of Cardiovascular disorder-Heart murmur sister Family History High cholesterol-Mother  Social History: Reviewed history from 08/15/2008 and no changes required. Single Never Smoked Alcohol use-yes Drug use-no Regular exercise-yes Physical Exam General appearance: well developed, well nourished,  she is scratching herself Ears: normal, no lesions or deformities, she may have very slight preauricular LAD Nasal: mucosa pink, nonedematous, no septal deviation, turbinates normal Oral/Pharynx: pharyngeal mild erythema with 1 left sided exudate, uvula midline without deviation, no swelling Chest/Lungs: no rales, wheezes, or rhonchi bilateral, breath sounds equal without effort Heart: regular rate and  rhythm, no murmur MSE: oriented to time, place, and person Scattered light excoritations and light rash on upper ext  and torso and face.   Assessment New Problems: THROAT PAIN (ICD-784.1) CONTACT DERMATITIS&OTHER ECZEMA DUE UNSPEC CAUSE (ICD-692.9)   Plan New Medications/Changes: PREDNISONE (PAK) 10 MG TABS (PREDNISONE) use as directed, 6 day pack, start on 04/04/11  #1 x 0, 05/03/2011, Hoyt Koch MD  New Orders: Admin of Therapeutic Inj  intramuscular or subcutaneous [96372] Solumedrol up to 125mg  [J2930] Est. Patient Level III [16109] Rapid Strep [60454] Planning Comments:   1) Likely is an atopic dermatitis from unknown irritant.  Solumedrol IM given and then to start prednisone taper tomorrow.  Mild non-scented hypoallergenic soaps, etc.  Cooler showers.  If worsening or new symptoms, should seek medical attention. 2) I don't see anything concerning with the ears.  LIkely is a viral  pharyngitis that is leading to some ear discomfort / eustachian tube dysfx.  Rapid strep is negative.  No further treatment unless worsening or LAD not improving.   The patient and/or caregiver has been counseled thoroughly with regard to medications prescribed including dosage, schedule, interactions, rationale for use, and possible side effects and they verbalize understanding.  Diagnoses and expected course of recovery discussed and will return if not improved as expected or if the condition worsens. Patient and/or caregiver verbalized understanding.  Prescriptions: PREDNISONE (PAK) 10 MG TABS (PREDNISONE) use as directed, 6 day pack, start on 04/04/11  #1 x 0   Entered and Authorized by:   Hoyt Koch MD   Signed by:   Hoyt Koch MD on 05/03/2011   Method used:   Print then Give to Patient   RxID:   [id number]   Medication Administration  Injection # 1:    Medication: Solumedrol up to 125mg     Diagnosis: CONTACT DERMATITIS&OTHER ECZEMA DUE UNSPEC CAUSE (ICD-692.9)    Route: IM    Site: RUOQ gluteus    Exp Date: 10/18/2013    Lot #: [id number]    Mfr: Pharmacia    Comments: 125mg   given    Patient tolerated injection without complications    Given by: Clemens Catholic LPN (May 03, 2011 2:00 PM)  Orders Added: 1)  Admin of Therapeutic Inj  intramuscular or subcutaneous [96372] 2)  Solumedrol up to 125mg  [J2930] 3)  Est. Patient Level III [30865] 4)  Rapid Strep [78469]     Laboratory Results  Date/Time Received: May 03, 2011 2:02 PM  Date/Time Reported: May 03, 2011 2:02 PM   Other Tests  Rapid Strep: negative  Kit Test Internal QC: Negative   (Normal Range: Negative)

## 2011-09-20 NOTE — Telephone Encounter (Signed)
  Phone Note Outgoing Call Call back at [facility] Phone [phone]   Call placed by: Lavell Islam RN,  May 05, 2011 6:15 PM Call placed to: Patient Action Taken: Phone Call Completed Summary of Call: Left message on voice mail requesting patient call and update [place] on how she is feeling/doing. Initial call taken by: Lavell Islam RN,  May 05, 2011 6:15 PM

## 2011-09-20 NOTE — Telephone Encounter (Signed)
  Phone Note Outgoing Call   Call placed by: Emilio Math,  May 04, 2011 6:27 PM Call placed to: Patient Summary of Call: Rayna Sexton she's itching less, but still has sore throat, neck hurts.  As per Dr Orson Aloe call in to [facility] on [address]: Amoxicillin 875mg s one tab by mouth two times a day X 1 week disp #14 no refills.

## 2011-09-29 ENCOUNTER — Encounter: Payer: Self-pay | Admitting: *Deleted

## 2011-10-01 ENCOUNTER — Other Ambulatory Visit: Payer: 59 | Admitting: Lab

## 2011-10-01 ENCOUNTER — Ambulatory Visit: Payer: 59 | Admitting: Oncology

## 2011-11-23 ENCOUNTER — Encounter: Payer: Self-pay | Admitting: Nurse Practitioner

## 2011-11-23 ENCOUNTER — Ambulatory Visit (INDEPENDENT_AMBULATORY_CARE_PROVIDER_SITE_OTHER): Payer: 59 | Admitting: Nurse Practitioner

## 2011-11-23 DIAGNOSIS — F32A Depression, unspecified: Secondary | ICD-10-CM

## 2011-11-23 DIAGNOSIS — G4700 Insomnia, unspecified: Secondary | ICD-10-CM

## 2011-11-23 DIAGNOSIS — R519 Headache, unspecified: Secondary | ICD-10-CM

## 2011-11-23 DIAGNOSIS — M62838 Other muscle spasm: Secondary | ICD-10-CM

## 2011-11-23 DIAGNOSIS — F329 Major depressive disorder, single episode, unspecified: Secondary | ICD-10-CM

## 2011-11-23 DIAGNOSIS — G43009 Migraine without aura, not intractable, without status migrainosus: Secondary | ICD-10-CM

## 2011-11-23 DIAGNOSIS — R51 Headache: Secondary | ICD-10-CM

## 2011-11-23 DIAGNOSIS — G43909 Migraine, unspecified, not intractable, without status migrainosus: Secondary | ICD-10-CM | POA: Insufficient documentation

## 2011-11-23 MED ORDER — CYCLOBENZAPRINE HCL 10 MG PO TABS: 10.0000 mg | ORAL_TABLET | Freq: Three times a day (TID) | ORAL | Status: DC | PRN

## 2011-11-23 MED ORDER — SUMATRIPTAN SUCCINATE 100 MG PO TABS: 100.0000 mg | ORAL_TABLET | Freq: Once | ORAL | Status: DC | PRN

## 2011-11-23 MED ORDER — VENLAFAXINE HCL ER 75 MG PO CP24: 75.0000 mg | ORAL_CAPSULE | Freq: Every day | ORAL | Status: DC

## 2011-11-23 NOTE — Progress Notes (Signed)
Diagnosis and History: Pt has had headache of some type since she was 51 years old.She has been seen by neurologist and been placed on topamax, imitrex and fiorcet.  In the past 2 years her migraines have lessened and her last one was in December. She has taken Imitrex in past with good relief. She now has a chronic daily headache. She used to take OTC NSAIDS and now takes tylenol several times daily. She had gastric bypass surgery and was told to avoid NSAIDS. Her pain is located in neck regions and she has been on muscle relaxer's in past. She has history of depression and insomnia. She takes Lunesta to sleep but wakes at 4:30 each morning and stays awake for 1 hour, falls back to sleep for several hours and awakes feeling unrefreshed. She feels sleepy most of day. She denies stress. Hx of anemia with 2 transfusions, well controlled now. Denies any cardiac history.    Location:neck  Number of Headache days/month: Severe:0 Moderate:30 Mild:0  Current Outpatient Prescriptions on File Prior to Visit  Medication Sig Dispense Refill  . calcium citrate-vitamin D 200-200 MG-UNIT TABS Take 1 tablet by mouth daily.        . Cholecalciferol (VITAMIN D3) 5000 UNITS TABS Take 1 tablet by mouth 2 (two) times daily.        . citalopram (CELEXA) 10 MG tablet Take 20 mg by mouth daily. Patient takes 5 mg      . Cyanocobalamin (VITAMIN B-12) 500 MCG SUBL Place 1 tablet under the tongue daily.        . Multiple Vitamin (MULTIVITAMIN) tablet Take 1 tablet by mouth daily.        . eszopiclone (LUNESTA) 2 MG TABS Take 2 mg by mouth at bedtime. Take immediately before bedtime        Acute prevention:tylenol  Past Medical History  Diagnosis Date  . Arthritis   . Obesity   . Anemia   . Simple ovarian cyst     Right   Past Surgical History  Procedure Date  . 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 Nose surgery   . Breast surgery     reduction   Family History  Problem Relation Age of Onset  . Hypertension Mother    . Hypertension Father   . Heart disease Maternal Grandfather   . Cancer Paternal Grandmother     lung   Social History:  reports that she has never smoked. She does not have any smokeless tobacco history on file. She reports that she drinks alcohol. She reports that she does not use illicit drugs. Allergies:  Allergies  Allergen Reactions  . Sulfa Antibiotics     Triggers: depression, insomnia  Birth control: ablation  ROS: chronic pain several sites, muscle spasm, insomnia, headache, depression  Exam:Well developed well nourished   General: 51 yo Caucasian female HEENT: negative Cardiac: negative Lungs:negative Neuro: negative Skin:negative  Impression: Chronic daily headache with occasional migraine   Plan We will switch her from Celexa to Effexor 75mg  daily. We will plan to go up to 150mg  after a titration phase. For her sleep will order a sleep study. She will continue Lunesta as needed. We will refer her to Physical Therapy for neck spasm and pain. In addition will start flexeril 10mg  as needed. She will be given Imitrex to take as needed for migraine. RTC 1 month  Time Spent: 

## 2011-11-23 NOTE — Patient Instructions (Addendum)
Migraine Headache A migraine headache is an intense, throbbing pain on one or both sides of your head. The exact cause of a migraine headache is not always known. A migraine may be caused when nerves in the brain become irritated and release chemicals that cause swelling within blood vessels, causing pain. Many migraine sufferers have a family history of migraines. Before you get a migraine you may or may not get an aura. An aura is a group of symptoms that can predict the beginning of a migraine. An aura may include:  Visual changes such as:   Flashing lights.   Bright spots or zig-zag lines.   Tunnel vision.   Feelings of numbness.   Trouble talking.   Muscle weakness.  SYMPTOMS  Pain on one or both sides of your head.   Pain that is pulsating or throbbing in nature.   Pain that is severe enough to prevent daily activities.   Pain that is aggravated by any daily physical activity.   Nausea (feeling sick to your stomach), vomiting, or both.   Pain with exposure to bright lights, loud noises, or activity.   General sensitivity to bright lights or loud noises.  MIGRAINE TRIGGERS Examples of triggers of migraine headaches include:   Alcohol.   Smoking.   Stress.   It may be related to menses (female menstruation).   Aged cheeses.   Foods or drinks that contain nitrates, glutamate, aspartame, or tyramine.   Lack of sleep.   Chocolate.   Caffeine.   Hunger.   Medications such as nitroglycerine (used to treat chest pain), birth control pills, estrogen, and some blood pressure medications.  DIAGNOSIS  A migraine headache is often diagnosed based on:  Symptoms.   Physical examination.   A computerized X-ray scan (computed tomography, CT) of your head.  TREATMENT  Medications can help prevent migraines if they are recurrent or should they become recurrent. Your caregiver can help you with a medication or treatment program that will be helpful to you.   Lying  down in a dark, quiet room may be helpful.   Keeping a headache diary may help you find a trend as to what may be triggering your headaches.  SEEK IMMEDIATE MEDICAL CARE IF:   You have confusion, personality changes or seizures.   You have headaches that wake you from sleep.   You have an increased frequency in your headaches.   You have a stiff neck.   You have a loss of vision.   You have muscle weakness.   You start losing your balance or have trouble walking.   You feel faint or pass out.  MAKE SURE YOU:   Understand these instructions.   Will watch your condition.   Will get help right away if you are not doing well or get worse.  Document Released: 10/04/2005 Document Revised: 06/16/2011 Document Reviewed: 05/20/2009 [facility] Patient Information 2012 [place], [place].Migraine Headache A migraine headache is an intense, throbbing pain on one or both sides of your head. The exact cause of a migraine headache is not always known. A migraine may be caused when nerves in the brain become irritated and release chemicals that cause swelling within blood vessels, causing pain. Many migraine sufferers have a family history of migraines. Before you get a migraine you may or may not get an aura. An aura is a group of symptoms that can predict the beginning of a migraine. An aura may include:  Visual changes such as:   Flashing  lights.   Bright spots or zig-zag lines.   Tunnel vision.   Feelings of numbness.   Trouble talking.   Muscle weakness.  SYMPTOMS  Pain on one or both sides of your head.   Pain that is pulsating or throbbing in nature.   Pain that is severe enough to prevent daily activities.   Pain that is aggravated by any daily physical activity.   Nausea (feeling sick to your stomach), vomiting, or both.   Pain with exposure to bright lights, loud noises, or activity.   General sensitivity to bright lights or loud noises.  MIGRAINE TRIGGERS Examples of  triggers of migraine headaches include:   Alcohol.   Smoking.   Stress.   It may be related to menses (female menstruation).   Aged cheeses.   Foods or drinks that contain nitrates, glutamate, aspartame, or tyramine.   Lack of sleep.   Chocolate.   Caffeine.   Hunger.   Medications such as nitroglycerine (used to treat chest pain), birth control pills, estrogen, and some blood pressure medications.  DIAGNOSIS  A migraine headache is often diagnosed based on:  Symptoms.   Physical examination.   A computerized X-ray scan (computed tomography, CT) of your head.  TREATMENT  Medications can help prevent migraines if they are recurrent or should they become recurrent. Your caregiver can help you with a medication or treatment program that will be helpful to you.   Lying down in a dark, quiet room may be helpful.   Keeping a headache diary may help you find a trend as to what may be triggering your headaches.  SEEK IMMEDIATE MEDICAL CARE IF:   You have confusion, personality changes or seizures.   You have headaches that wake you from sleep.   You have an increased frequency in your headaches.   You have a stiff neck.   You have a loss of vision.   You have muscle weakness.   You start losing your balance or have trouble walking.   You feel faint or pass out.  MAKE SURE YOU:   Understand these instructions.   Will watch your condition.   Will get help right away if you are not doing well or get worse.  Document Released: 10/04/2005 Document Revised: 06/16/2011 Document Reviewed: 05/20/2009 [facility] Patient Information 2012 [place], [place].

## 2011-11-30 ENCOUNTER — Institutional Professional Consult (permissible substitution): Payer: 59 | Admitting: Nurse Practitioner

## 2011-12-08 ENCOUNTER — Ambulatory Visit: Payer: 59 | Attending: Nurse Practitioner | Admitting: Physical Therapy

## 2011-12-08 DIAGNOSIS — M6281 Muscle weakness (generalized): Secondary | ICD-10-CM | POA: Insufficient documentation

## 2011-12-08 DIAGNOSIS — M542 Cervicalgia: Secondary | ICD-10-CM | POA: Insufficient documentation

## 2011-12-08 DIAGNOSIS — IMO0001 Reserved for inherently not codable concepts without codable children: Secondary | ICD-10-CM | POA: Insufficient documentation

## 2011-12-10 ENCOUNTER — Ambulatory Visit: Payer: 59 | Admitting: Physical Therapy

## 2011-12-13 ENCOUNTER — Ambulatory Visit: Payer: 59 | Admitting: Physical Therapy

## 2011-12-17 ENCOUNTER — Ambulatory Visit: Payer: 59 | Attending: Nurse Practitioner | Admitting: Physical Therapy

## 2011-12-17 DIAGNOSIS — M542 Cervicalgia: Secondary | ICD-10-CM | POA: Insufficient documentation

## 2011-12-17 DIAGNOSIS — IMO0001 Reserved for inherently not codable concepts without codable children: Secondary | ICD-10-CM | POA: Insufficient documentation

## 2011-12-17 DIAGNOSIS — M6281 Muscle weakness (generalized): Secondary | ICD-10-CM | POA: Insufficient documentation

## 2011-12-19 ENCOUNTER — Ambulatory Visit (HOSPITAL_BASED_OUTPATIENT_CLINIC_OR_DEPARTMENT_OTHER): Payer: 59 | Attending: Nurse Practitioner

## 2011-12-19 VITALS — Ht 66.5 in | Wt 190.0 lb

## 2011-12-19 DIAGNOSIS — G4700 Insomnia, unspecified: Secondary | ICD-10-CM | POA: Insufficient documentation

## 2011-12-19 DIAGNOSIS — G4733 Obstructive sleep apnea (adult) (pediatric): Secondary | ICD-10-CM

## 2011-12-19 DIAGNOSIS — G4730 Sleep apnea, unspecified: Secondary | ICD-10-CM | POA: Insufficient documentation

## 2011-12-21 ENCOUNTER — Ambulatory Visit: Payer: 59 | Admitting: Physical Therapy

## 2011-12-21 ENCOUNTER — Institutional Professional Consult (permissible substitution): Payer: 59 | Admitting: Nurse Practitioner

## 2011-12-24 ENCOUNTER — Ambulatory Visit: Payer: 59 | Admitting: Physical Therapy

## 2011-12-25 DIAGNOSIS — G4700 Insomnia, unspecified: Secondary | ICD-10-CM

## 2011-12-25 DIAGNOSIS — G4730 Sleep apnea, unspecified: Secondary | ICD-10-CM

## 2011-12-25 NOTE — Procedures (Signed)
NAME:  Rose Long, Rose Long NO.:  [id number]  MEDICAL RECORD NO.:  [id number]          PATIENT TYPE:  OUT  LOCATION:  SLEEP CENTER                 FACILITY:  [facility]  PHYSICIAN:  Alynn Ellithorpe D. Maple Hudson, MD, FCCP, FACPDATE OF BIRTH:  24-Jul-1962  DATE OF STUDY:  12/19/2011                           NOCTURNAL POLYSOMNOGRAM  REFERRING PHYSICIAN:  LINDA MILLER BAREFOOT  REFERRING PHYSICIAN:  Dr. Jannifer Rodney.  INDICATION FOR STUDY:  Insomnia with sleep apnea.  EPWORTH SLEEPINESS SCORE:  17/24.  BMI 30.2, weight 190 pounds, height 66.5 inches, neck 14 inches.  MEDICATIONS:  Home medications were charted and reviewed.  SLEEP ARCHITECTURE:  Total sleep time 342.5 minutes with sleep efficiency 93.3%.  Stage I was 5.4%, stage II 84.4%, stage III absent, REM 10.2% of total sleep time.  Sleep latency 9 minutes, REM latency 212 minutes, awake after sleep onset 15.5 minutes.  Arousal index of 4.  BEDTIME MEDICATION:  Lunesta 3 mg.  RESPIRATORY DATA:  Apnea-hypopnea index (AHI) 0.7 per hour.  A total of 4 events was scored including 2 central apneas and 2 hypopneas, all associated with nonsupine sleep position.  OXYGEN DATA:  Mild snoring with oxygen desaturation to a nadir of 91% and mean oxygen saturation through the study of 94.8% on room air.  CARDIAC DATA:  Sinus rhythm, occasional PAC and PVC.  MOVEMENT-PARASOMNIA:  No significant movement disturbance.  No bathroom trips.  IMPRESSIONS-RECOMMENDATIONS: 1. Sleep architecture was significant for a majority of time spent in     stage II reflecting the use of a hypnotic medication, Lunesta 3 mg.     There was insignificant waking during sleep. 2. Rare respiratory events with sleep disturbance, within normal     limits.  AHI 0.7 per hour (the normal range for adults is from 0-5     events per hour).  Mild snoring with nonsupine events and normal     oxygenation, nadir 91% on room air, and mean oxygen saturation     through  the study 94.8%.      Mick Tanguma D. Maple Hudson, MD, [facility], FACP Diplomate, American Board of Sleep Medicine    CDY/MEDQ  D:  12/25/2011 10:22:47  T:  12/25/2011 23:43:42  Job:  [id number]

## 2011-12-27 ENCOUNTER — Ambulatory Visit: Payer: 59 | Admitting: Physical Therapy

## 2011-12-28 ENCOUNTER — Ambulatory Visit: Payer: 59 | Admitting: Nurse Practitioner

## 2011-12-28 ENCOUNTER — Institutional Professional Consult (permissible substitution): Payer: 59 | Admitting: Nurse Practitioner

## 2011-12-29 ENCOUNTER — Other Ambulatory Visit: Payer: Self-pay | Admitting: Emergency Medicine

## 2011-12-29 MED ORDER — VENLAFAXINE HCL ER 150 MG PO CP24: 150.0000 mg | ORAL_CAPSULE | Freq: Every day | ORAL | Status: DC

## 2011-12-29 NOTE — Telephone Encounter (Signed)
Retail medication per VO Jannifer Rodney, NP

## 2011-12-30 ENCOUNTER — Ambulatory Visit: Payer: 59 | Admitting: Physical Therapy

## 2012-01-03 ENCOUNTER — Encounter: Payer: 59 | Admitting: Physical Therapy

## 2012-01-07 ENCOUNTER — Ambulatory Visit: Payer: 59 | Admitting: Physical Therapy

## 2012-01-10 ENCOUNTER — Ambulatory Visit: Payer: 59 | Admitting: Physical Therapy

## 2012-01-11 ENCOUNTER — Ambulatory Visit: Payer: 59 | Admitting: Physical Therapy

## 2012-01-18 ENCOUNTER — Ambulatory Visit (INDEPENDENT_AMBULATORY_CARE_PROVIDER_SITE_OTHER): Payer: 59 | Admitting: Nurse Practitioner

## 2012-01-18 ENCOUNTER — Encounter: Payer: Self-pay | Admitting: Nurse Practitioner

## 2012-01-18 VITALS — BP 111/72 | HR 70 | Resp 16 | Ht 66.5 in | Wt 191.0 lb

## 2012-01-18 DIAGNOSIS — M542 Cervicalgia: Secondary | ICD-10-CM

## 2012-01-18 DIAGNOSIS — M62838 Other muscle spasm: Secondary | ICD-10-CM

## 2012-01-18 DIAGNOSIS — M25569 Pain in unspecified knee: Secondary | ICD-10-CM

## 2012-01-18 DIAGNOSIS — M25562 Pain in left knee: Secondary | ICD-10-CM

## 2012-01-18 MED ORDER — LIDOCAINE 5 % EX PTCH: 1.0000 | MEDICATED_PATCH | CUTANEOUS | Status: AC

## 2012-01-18 MED ORDER — CYCLOBENZAPRINE HCL 10 MG PO TABS: 10.0000 mg | ORAL_TABLET | Freq: Three times a day (TID) | ORAL | Status: DC | PRN

## 2012-01-18 NOTE — Patient Instructions (Signed)

## 2012-01-18 NOTE — Progress Notes (Signed)
S: Pt returns today for follow up visit. She has gone up on Effexor to 150mg  following our telephone conversation. She feels that has been helpful in addition to the Physical Therapy. The traction on the physical therapy has been most helpful. She has never had an MRI of her neck and would like to do so.  She is having some issues with left knee and asks for physical therapy for left knee as well. She feels the flexeril helpful and would like to continue that medication. Her headaches have gone from daily to 2-3 per week. In fact she has gone for up to 5 days without a headache.   O: Well developed well nourished caucasian female in NAD. Neuro negative. Ortho left knee pain  A: Chronic daily headache improving.  Neck pain Left knee pain  P: Continue physical therapy on neck and add evaluation and treatment of left knee Schedule MRI of neck Refill  Flexeril

## 2012-01-22 ENCOUNTER — Ambulatory Visit (HOSPITAL_BASED_OUTPATIENT_CLINIC_OR_DEPARTMENT_OTHER)
Admission: RE | Admit: 2012-01-22 | Discharge: 2012-01-22 | Disposition: A | Discharge status: Home / Self Care | Payer: 59 | Source: Ambulatory Visit | Attending: Nurse Practitioner | Admitting: Nurse Practitioner

## 2012-01-22 DIAGNOSIS — R209 Unspecified disturbances of skin sensation: Secondary | ICD-10-CM

## 2012-01-22 DIAGNOSIS — M542 Cervicalgia: Secondary | ICD-10-CM | POA: Insufficient documentation

## 2012-01-22 DIAGNOSIS — M62838 Other muscle spasm: Secondary | ICD-10-CM

## 2012-01-22 DIAGNOSIS — R51 Headache: Secondary | ICD-10-CM | POA: Insufficient documentation

## 2012-02-22 ENCOUNTER — Encounter: Payer: Self-pay | Admitting: Nurse Practitioner

## 2012-02-22 ENCOUNTER — Ambulatory Visit (INDEPENDENT_AMBULATORY_CARE_PROVIDER_SITE_OTHER): Payer: 59 | Admitting: Nurse Practitioner

## 2012-02-22 VITALS — BP 130/81 | HR 76 | Ht 66.0 in | Wt 191.1 lb

## 2012-02-22 DIAGNOSIS — E669 Obesity, unspecified: Secondary | ICD-10-CM

## 2012-02-22 DIAGNOSIS — G43109 Migraine with aura, not intractable, without status migrainosus: Secondary | ICD-10-CM

## 2012-02-22 NOTE — Patient Instructions (Signed)

## 2012-02-22 NOTE — Progress Notes (Signed)
S: Follow up on migraine headaches. MRI of neck was negative other than some DDD. She has not done Physical Therapy yet due to her busy schedule. Her plan is to make those appointments now. She has been taking her flexeril daily and that helps. Her headaches have gone from daily to 2x week and she feels they are less intense. She has not taken Imitrex. She took it in the past  And felt it made her sleepy. She mostly takes tylenol for headache.  O: Alert, oriented, NAD Neuro: Negative Skin: warm and dry  A: Migraine  Muscle Spasm Neck Pain  P: We discussed pathophysiology of migraine and how Imitrex works. She is willing to take 1/2 tab and add an aleve. Tylenol is not very helpful in migraine. She will see if that helps to lessen migraines and if not will consider adding another preventive. RTC 3 months or prn

## 2012-02-28 ENCOUNTER — Ambulatory Visit: Payer: 59 | Attending: Nurse Practitioner | Admitting: Physical Therapy

## 2012-02-28 DIAGNOSIS — IMO0001 Reserved for inherently not codable concepts without codable children: Secondary | ICD-10-CM | POA: Insufficient documentation

## 2012-02-28 DIAGNOSIS — M542 Cervicalgia: Secondary | ICD-10-CM | POA: Insufficient documentation

## 2012-02-28 DIAGNOSIS — M6281 Muscle weakness (generalized): Secondary | ICD-10-CM | POA: Insufficient documentation

## 2012-03-03 ENCOUNTER — Ambulatory Visit: Payer: 59 | Admitting: Physical Therapy

## 2012-03-07 ENCOUNTER — Ambulatory Visit: Payer: 59 | Admitting: Physical Therapy

## 2012-03-14 ENCOUNTER — Encounter: Payer: 59 | Admitting: Physical Therapy

## 2012-03-20 ENCOUNTER — Encounter: Payer: 59 | Admitting: Physical Therapy

## 2012-03-23 ENCOUNTER — Ambulatory Visit: Payer: 59 | Attending: Nurse Practitioner | Admitting: Physical Therapy

## 2012-03-23 DIAGNOSIS — IMO0001 Reserved for inherently not codable concepts without codable children: Secondary | ICD-10-CM | POA: Insufficient documentation

## 2012-03-23 DIAGNOSIS — M542 Cervicalgia: Secondary | ICD-10-CM | POA: Insufficient documentation

## 2012-03-23 DIAGNOSIS — M6281 Muscle weakness (generalized): Secondary | ICD-10-CM | POA: Insufficient documentation

## 2012-07-12 ENCOUNTER — Other Ambulatory Visit: Payer: Self-pay | Admitting: *Deleted

## 2012-07-12 DIAGNOSIS — G4700 Insomnia, unspecified: Secondary | ICD-10-CM

## 2012-07-12 MED ORDER — ESZOPICLONE 3 MG PO TABS: 3.0000 mg | ORAL_TABLET | Freq: Every day | ORAL | Status: DC

## 2012-07-12 NOTE — Telephone Encounter (Signed)
Pt called requesting a RF on Lunesta 3mg .  She usually gets this from her PMD but since she is seeing Bonita Quin now for her migraines, she is requesting that [place] manage this.  RX sent to [facility].

## 2012-07-18 ENCOUNTER — Encounter: Payer: Self-pay | Admitting: Nurse Practitioner

## 2012-07-18 ENCOUNTER — Ambulatory Visit (INDEPENDENT_AMBULATORY_CARE_PROVIDER_SITE_OTHER): Payer: 59 | Admitting: Nurse Practitioner

## 2012-07-18 VITALS — BP 117/72 | HR 74 | Temp 97.7°F | Resp 16 | Ht 66.5 in | Wt 209.0 lb

## 2012-07-18 DIAGNOSIS — G43909 Migraine, unspecified, not intractable, without status migrainosus: Secondary | ICD-10-CM

## 2012-07-18 DIAGNOSIS — M62838 Other muscle spasm: Secondary | ICD-10-CM

## 2012-07-18 MED ORDER — SUMATRIPTAN SUCCINATE 100 MG PO TABS: 100.0000 mg | ORAL_TABLET | Freq: Once | ORAL | Status: DC | PRN

## 2012-07-18 MED ORDER — VENLAFAXINE HCL ER 150 MG PO CP24: 150.0000 mg | ORAL_CAPSULE | Freq: Every day | ORAL | Status: DC

## 2012-07-18 MED ORDER — TIZANIDINE HCL 4 MG PO TABS: 4.0000 mg | ORAL_TABLET | Freq: Every day | ORAL | Status: DC

## 2012-07-18 NOTE — Progress Notes (Signed)
S: Pt is doing well with her migraines. She is having about 2-3 per week and is now treating with Imitrex and Motrin. She is sleeping well with Lunesta. She is concerned today about the weight gain from Flexeril. She has been getting massage regularly and that has helped. She has also started a regular exercise program.   O: Alert,oriented, NAD Neuro: Negative Skin: Warm and dry  A: Migraine  Muscle spasm Insomnia Weight gain  P: Her weight gain is likely related to the Flexeril.We will switch that over to Zanaflex. Otherwise she is doing great. Will se her back in 4 weeks

## 2012-07-18 NOTE — Patient Instructions (Signed)
Migraine Headache A migraine headache is an intense, throbbing pain on one or both sides of your head. A migraine can last for 30 minutes to several hours. CAUSES  The exact cause of a migraine headache is not always known. However, a migraine may be caused when nerves in the brain become irritated and release chemicals that cause inflammation. This causes pain. SYMPTOMS  Pain on one or both sides of your head.  Pulsating or throbbing pain.  Severe pain that prevents daily activities.  Pain that is aggravated by any physical activity.  Nausea, vomiting, or both.  Dizziness.  Pain with exposure to bright lights, loud noises, or activity.  General sensitivity to bright lights, loud noises, or smells. Before you get a migraine, you may get warning signs that a migraine is coming (aura). An aura may include:  Seeing flashing lights.  Seeing bright spots, halos, or zig-zag lines.  Having tunnel vision or blurred vision.  Having feelings of numbness or tingling.  Having trouble talking.  Having muscle weakness. MIGRAINE TRIGGERS  Alcohol.  Smoking.  Stress.  Menstruation.  Aged cheeses.  Foods or drinks that contain nitrates, glutamate, aspartame, or tyramine.  Lack of sleep.  Chocolate.  Caffeine.  Hunger.  Physical exertion.  Fatigue.  Medicines used to treat chest pain (nitroglycerine), birth control pills, estrogen, and some blood pressure medicines. DIAGNOSIS  A migraine headache is often diagnosed based on:  Symptoms.  Physical examination.  A CT scan or MRI of your head. TREATMENT Medicines may be given for pain and nausea. Medicines can also be given to help prevent recurrent migraines.  HOME CARE INSTRUCTIONS  Only take over-the-counter or prescription medicines for pain or discomfort as directed by your caregiver. The use of long-term narcotics is not recommended.  Lie down in a dark, quiet room when you have a migraine.  Keep a journal  to find out what may trigger your migraine headaches. For example, write down:  What you eat and drink.  How much sleep you get.  Any change to your diet or medicines.  Limit alcohol consumption.  Quit smoking if you smoke.  Get 7 to 9 hours of sleep, or as recommended by your caregiver.  Limit stress.  Keep lights dim if bright lights bother you and make your migraines worse. SEEK IMMEDIATE MEDICAL CARE IF:   Your migraine becomes severe.  You have a fever.  You have a stiff neck.  You have vision loss.  You have muscular weakness or loss of muscle control.  You start losing your balance or have trouble walking.  You feel faint or pass out.  You have severe symptoms that are different from your first symptoms. MAKE SURE YOU:   Understand these instructions.  Will watch your condition.  Will get help right away if you are not doing well or get worse. Document Released: 10/04/2005 Document Revised: 12/27/2011 Document Reviewed: 09/24/2011 [employer] Patient Information 2013 [employer].  

## 2012-07-28 ENCOUNTER — Ambulatory Visit: Payer: 59 | Admitting: Family Medicine

## 2012-08-04 ENCOUNTER — Ambulatory Visit (INDEPENDENT_AMBULATORY_CARE_PROVIDER_SITE_OTHER): Payer: 59 | Admitting: Family Medicine

## 2012-08-04 ENCOUNTER — Ambulatory Visit (HOSPITAL_BASED_OUTPATIENT_CLINIC_OR_DEPARTMENT_OTHER)
Admission: RE | Admit: 2012-08-04 | Discharge: 2012-08-04 | Disposition: A | Discharge status: Home / Self Care | Payer: 59 | Source: Ambulatory Visit | Attending: Family Medicine | Admitting: Family Medicine

## 2012-08-04 ENCOUNTER — Encounter: Payer: Self-pay | Admitting: Family Medicine

## 2012-08-04 VITALS — BP 167/90 | HR 78 | Resp 16 | Ht 66.75 in | Wt 212.0 lb

## 2012-08-04 DIAGNOSIS — G43909 Migraine, unspecified, not intractable, without status migrainosus: Secondary | ICD-10-CM

## 2012-08-04 DIAGNOSIS — R110 Nausea: Secondary | ICD-10-CM

## 2012-08-04 DIAGNOSIS — R1011 Right upper quadrant pain: Secondary | ICD-10-CM | POA: Insufficient documentation

## 2012-08-04 DIAGNOSIS — R635 Abnormal weight gain: Secondary | ICD-10-CM

## 2012-08-04 LAB — COMPLETE METABOLIC PANEL WITH GFR
BUN: 8 mg/dL (ref 6–23)
CO2: 27 mEq/L (ref 19–32)
Calcium: 9.9 mg/dL (ref 8.4–10.5)
Chloride: 104 mEq/L (ref 96–112)
Creat: 0.67 mg/dL (ref 0.50–1.10)
GFR, Est African American: >89 mL/min
Glucose, Bld: 85 mg/dL (ref 70–99)

## 2012-08-04 LAB — CBC WITH DIFFERENTIAL/PLATELET
Basophils Absolute: 0 10*3/uL (ref 0.0–0.1)
Eosinophils Absolute: 0.2 10*3/uL (ref 0.0–0.7)
Lymphocytes Relative: 31 % (ref 12–46)
Lymphs Abs: 2.5 10*3/uL (ref 0.7–4.0)
MCH: 29.2 pg (ref 26.0–34.0)
Neutrophils Relative %: 60 % (ref 43–77)
Platelets: 297 10*3/uL (ref 150–400)
RBC: 4.73 MIL/uL (ref 3.87–5.11)
WBC: 8 10*3/uL (ref 4.0–10.5)

## 2012-08-04 LAB — AMYLASE: Amylase: 48 U/L (ref 0–105)

## 2012-08-04 MED ORDER — VENLAFAXINE HCL ER 37.5 MG PO CP24: ORAL_CAPSULE | ORAL | Status: DC

## 2012-08-04 MED ORDER — TOPIRAMATE 25 MG PO TABS: ORAL_TABLET | ORAL | Status: DC

## 2012-08-04 NOTE — Patient Instructions (Addendum)
Keep follow-up with Jannifer Rodney in one week.

## 2012-08-04 NOTE — Progress Notes (Signed)
Subjective:    Patient ID: Rose Long, female    DOB: 01-21-1961, 51 y.o.   MRN: [id number]  HPI  She's here to establish care today. She has a history of migraines and insomnia. She has an acute concern that of abdominal pain. Says has has epigastric pain for 6 weeks. Her pain is mostly in the epigastrium and radiates towards the right and left sides of her abdomen. Says has some chornic nausea, but does feel it has been worse lately.  No vomiting. She says her pain is worse with fatty foods.  No real alleviating symptoms. When asked if she had diarrhea she reported that she has a hx of explosive diarrhea since her bypass surgery.  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  She is somewhat frustrated because recently, she has gained some weight in the last 7 months.  Has been on Effexor since Feb for her migraines and feels that it may be causing some of the weight gain. She currently sees [facility] for her migraines. Effexor has been extremely helpful but she is worried about continuing it and continuing to gain weight.. She is currently working out 5 days per week for approximately an hour. Never had thyroid issues.  Has used topamax in the past, approximately 13 years ago. She wasn't sure if it worked well or not. She is really wanting to come off the Effexor. She has been under some more stress recently as her parents moved into her home in June. She does report that she had thyroid labs done not that long ago that were normal.   Review of Systems  Constitutional: Positive for fever, diaphoresis and unexpected weight change.  HENT: Negative for hearing loss, rhinorrhea and tinnitus.   Eyes: Positive for visual disturbance.  Respiratory: Negative for cough and wheezing.   Cardiovascular: Negative for chest pain and palpitations.  Gastrointestinal: Positive for nausea, diarrhea and blood in stool. Negative for vomiting.  Genitourinary: Negative for vaginal bleeding, vaginal  discharge and difficulty urinating.  Musculoskeletal: Positive for myalgias and arthralgias.  Skin: Negative for rash.  Neurological: Positive for headaches.  Hematological: Negative for adenopathy. Bruises/bleeds easily.  Psychiatric/Behavioral: Positive for disturbed wake/sleep cycle and dysphoric mood. The patient is nervous/anxious.        Objective:   Physical Exam  Constitutional: She is oriented to person, place, and time. She appears well-developed and well-nourished.  HENT:  Head: Normocephalic and atraumatic.  Neck: Neck supple. No thyromegaly present.  Cardiovascular: Normal rate, regular rhythm and normal heart sounds.   Pulmonary/Chest: Effort normal and breath sounds normal.  Abdominal: Soft. Bowel sounds are normal. She exhibits no distension and no mass. There is tenderness. There is no rebound and no guarding.       Tender in the RUQ and mildly in the epigastrum.   Lymphadenopathy:    She has no cervical adenopathy.  Neurological: She is alert and oriented to person, place, and time.  Skin: Skin is warm and dry.  Psychiatric: She has a normal mood and affect. Her behavior is normal.          Assessment & Plan:  Right upper quadrant pain-very suspicious for cholecystitis. Would like to get some blood work including a CBC to rule out acute infection. We will get her scheduled for an ultrasound this evening at all possible. In the meantime make sure eating a very plain low-fat diet. We will also check her electrolytes and she has been having a lot of  diarrhea. We'll also check liver enzymes as well as her pancreas. Certainly she is at risk for inflammation of these organs.  Obesity-status post gastric bypass. She follows with her surgeon as needed. She does currently take a multivitamin, vitamin D, calcium and iron and B12.  Migraines-we discussed different options. Amitriptyline nortriptyline can potentially cause weight gain so recommend staying away from that. We  could consider beta blocker such as propranolol. Normally her blood pressure looks fantastic. When I look back in the system this is really the only high blood pressure she's had. We certainly could consider this. She reports taking this before but couldn't remember it has worked or not. She's never had a diagnosis of high blood pressure. And she somewhat stressed and anxious as she was late today. After discussing options she opted to wean off her Effexor, especially since she's concerned about the weight gain especially with working out 5 days per week. Instructions given to taper off. Then she can start Topamax at 25 mg at bedtime for one week and then increase to twice a day with a goal of getting to 50 mg twice a day. I discussed that Topamax would be a good choice because it can help suppress her appetite and help with weight in addition to helping to control her migraines. She can followup with Bonita Quin barefoot to see how she's doing on the Topamax and to titrate her dose.

## 2012-08-10 ENCOUNTER — Other Ambulatory Visit: Payer: Self-pay | Admitting: Nurse Practitioner

## 2012-08-15 ENCOUNTER — Other Ambulatory Visit: Payer: Self-pay | Admitting: Family Medicine

## 2012-08-15 DIAGNOSIS — G4700 Insomnia, unspecified: Secondary | ICD-10-CM

## 2012-08-15 MED ORDER — ESZOPICLONE 3 MG PO TABS: 3.0000 mg | ORAL_TABLET | Freq: Every day | ORAL | Status: DC

## 2012-08-16 ENCOUNTER — Telehealth: Payer: Self-pay | Admitting: *Deleted

## 2012-08-16 NOTE — Telephone Encounter (Signed)
This prescription was sent yesterday with 4 refills. We may need to call the pharmacy to verify if they received it or not. Please also let patient know that we did take care of this yesterday.

## 2012-08-16 NOTE — Telephone Encounter (Signed)
Patient requesting Alfonso Patten and to be called to pharm

## 2012-08-17 ENCOUNTER — Telehealth: Payer: Self-pay | Admitting: *Deleted

## 2012-08-17 DIAGNOSIS — R1011 Right upper quadrant pain: Secondary | ICD-10-CM

## 2012-08-17 NOTE — Telephone Encounter (Signed)
Patient wants a referral to GI for abd pain and she will check with GYN also.

## 2012-08-17 NOTE — Telephone Encounter (Signed)
Referral placed.

## 2012-08-22 ENCOUNTER — Ambulatory Visit: Payer: 59 | Admitting: Nurse Practitioner

## 2012-08-29 ENCOUNTER — Telehealth: Payer: Self-pay | Admitting: *Deleted

## 2012-08-29 NOTE — Telephone Encounter (Signed)
Patient called asking for Estrogen- having Hot flashes or does she need an appointment

## 2012-08-29 NOTE — Telephone Encounter (Signed)
Has she been on tx before?  Really probably needs appt so we can discuss risk and benefitso f hormone replacement therapy.

## 2012-08-30 NOTE — Telephone Encounter (Signed)
Left message for patient to return call. Office visit

## 2012-08-31 ENCOUNTER — Other Ambulatory Visit: Payer: Self-pay | Admitting: Family Medicine

## 2012-09-19 ENCOUNTER — Encounter: Payer: Self-pay | Admitting: Nurse Practitioner

## 2012-09-19 ENCOUNTER — Ambulatory Visit (INDEPENDENT_AMBULATORY_CARE_PROVIDER_SITE_OTHER): Payer: 59 | Admitting: Nurse Practitioner

## 2012-09-19 VITALS — BP 122/79 | HR 97 | Temp 98.0°F | Resp 17 | Ht 66.5 in | Wt 215.0 lb

## 2012-09-19 DIAGNOSIS — G43909 Migraine, unspecified, not intractable, without status migrainosus: Secondary | ICD-10-CM

## 2012-09-19 MED ORDER — SUMATRIPTAN SUCCINATE 100 MG PO TABS: 100.0000 mg | ORAL_TABLET | Freq: Once | ORAL | Status: DC | PRN

## 2012-09-19 MED ORDER — ESZOPICLONE 3 MG PO TABS: 3.0000 mg | ORAL_TABLET | Freq: Every day | ORAL | Status: DC

## 2012-09-19 MED ORDER — TOPIRAMATE 50 MG PO TABS: 50.0000 mg | ORAL_TABLET | Freq: Two times a day (BID) | ORAL | Status: DC

## 2012-09-19 NOTE — Progress Notes (Signed)
S: Pt is here today to follow upon migraines. She was not happy with her weight gain on Effexor and went to see her PCP who tapered her off that and started her on Topamax. She has noticed less weight gain, more physical complaints, more irritability.She has been less able to exercise.  The dose now is 50 mg. She would like to go up and see if she feels better at a higher dose. She does have depression. She also needs Lunesta refilled.  A: Migraine Insomnia  P: OK to increase dose of Topamax to 50 mg BID Refill Lunesta 3mg  # 30 / 3 refills RTC PRN

## 2012-09-19 NOTE — Patient Instructions (Signed)
Migraine Headache A migraine headache is an intense, throbbing pain on one or both sides of your head. A migraine can last for 30 minutes to several hours. CAUSES  The exact cause of a migraine headache is not always known. However, a migraine may be caused when nerves in the brain become irritated and release chemicals that cause inflammation. This causes pain. SYMPTOMS  Pain on one or both sides of your head.  Pulsating or throbbing pain.  Severe pain that prevents daily activities.  Pain that is aggravated by any physical activity.  Nausea, vomiting, or both.  Dizziness.  Pain with exposure to bright lights, loud noises, or activity.  General sensitivity to bright lights, loud noises, or smells. Before you get a migraine, you may get warning signs that a migraine is coming (aura). An aura may include:  Seeing flashing lights.  Seeing bright spots, halos, or zig-zag lines.  Having tunnel vision or blurred vision.  Having feelings of numbness or tingling.  Having trouble talking.  Having muscle weakness. MIGRAINE TRIGGERS  Alcohol.  Smoking.  Stress.  Menstruation.  Aged cheeses.  Foods or drinks that contain nitrates, glutamate, aspartame, or tyramine.  Lack of sleep.  Chocolate.  Caffeine.  Hunger.  Physical exertion.  Fatigue.  Medicines used to treat chest pain (nitroglycerine), birth control pills, estrogen, and some blood pressure medicines. DIAGNOSIS  A migraine headache is often diagnosed based on:  Symptoms.  Physical examination.  A CT scan or MRI of your head. TREATMENT Medicines may be given for pain and nausea. Medicines can also be given to help prevent recurrent migraines.  HOME CARE INSTRUCTIONS  Only take over-the-counter or prescription medicines for pain or discomfort as directed by your caregiver. The use of long-term narcotics is not recommended.  Lie down in a dark, quiet room when you have a migraine.  Keep a journal  to find out what may trigger your migraine headaches. For example, write down:  What you eat and drink.  How much sleep you get.  Any change to your diet or medicines.  Limit alcohol consumption.  Quit smoking if you smoke.  Get 7 to 9 hours of sleep, or as recommended by your caregiver.  Limit stress.  Keep lights dim if bright lights bother you and make your migraines worse. SEEK IMMEDIATE MEDICAL CARE IF:   Your migraine becomes severe.  You have a fever.  You have a stiff neck.  You have vision loss.  You have muscular weakness or loss of muscle control.  You start losing your balance or have trouble walking.  You feel faint or pass out.  You have severe symptoms that are different from your first symptoms. MAKE SURE YOU:   Understand these instructions.  Will watch your condition.  Will get help right away if you are not doing well or get worse. Document Released: 10/04/2005 Document Revised: 12/27/2011 Document Reviewed: 09/24/2011 [employer] Patient Information 2013 [employer].  

## 2012-09-19 NOTE — Addendum Note (Signed)
Addended by: Delbert Phenix on: 09/19/2012 03:25 PM   Modules accepted: Orders

## 2012-09-27 IMAGING — US US TRANSVAGINAL NON-OB
1 series · 13 of 25 positions shown · non-contrast
Comparison: None.

CLINICAL DATA: Dysfunctional uterine bleeding.  Menometrorrhagia.
Dyspareunia.  LMP 01/10/2011.

TRANSABDOMINAL AND TRANSVAGINAL ULTRASOUND OF PELVIS
TECHNIQUE: Both transabdominal and transvaginal ultrasound
examinations of the pelvis were performed.  Transabdominal
technique was performed for global imaging of the pelvis including
uterus, ovaries, adnexal regions, and pelvic cul-de-sac.
It was necessary to proceed with endovaginal exam following the
transabdominal exam to visualize the individual fibroids,
endometrium, and ovaries.

[Series 1: us transvaginal non-ob · 0.26mm/px · 13 of 81 slices shown]
[im 1/81]
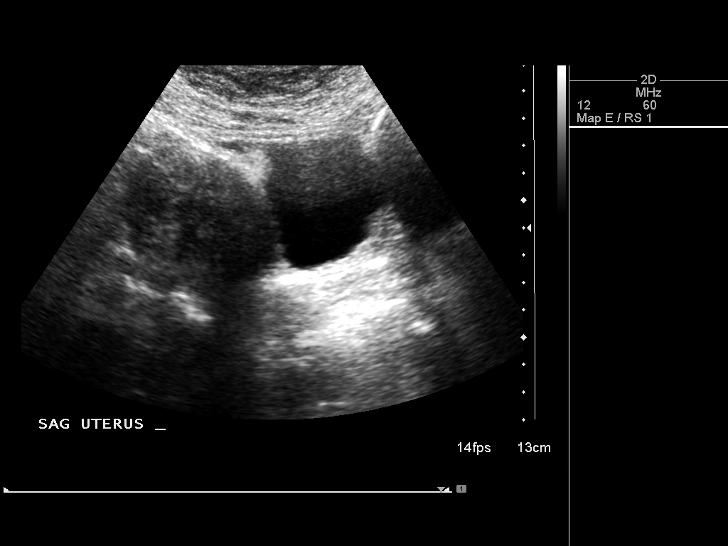
[im 7/81]
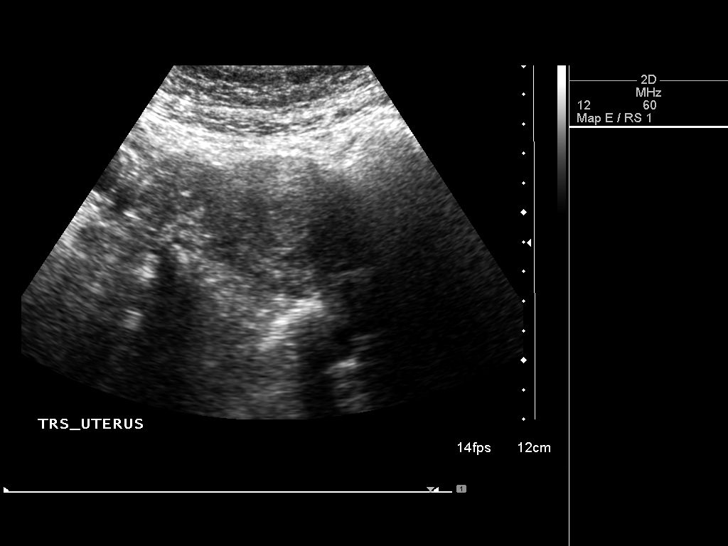
[im 14/81]
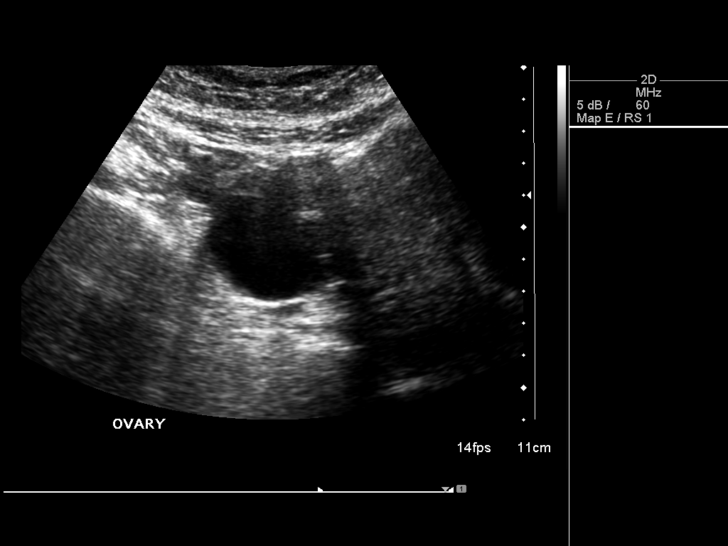
[im 21/81]
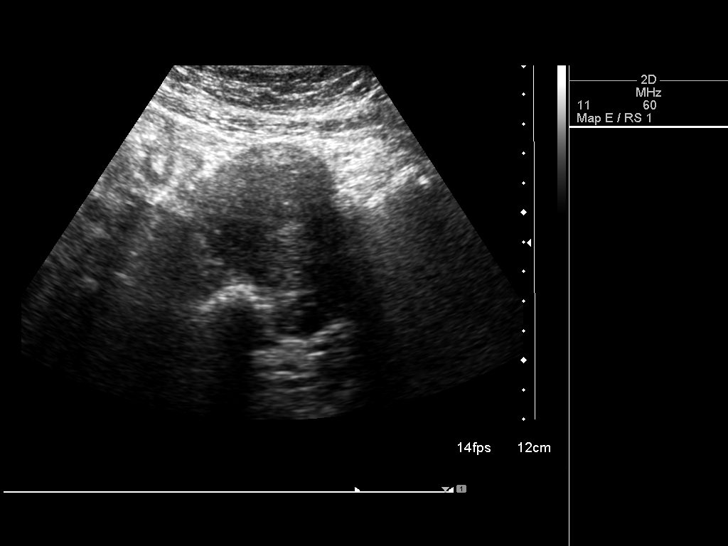
[im 27/81]
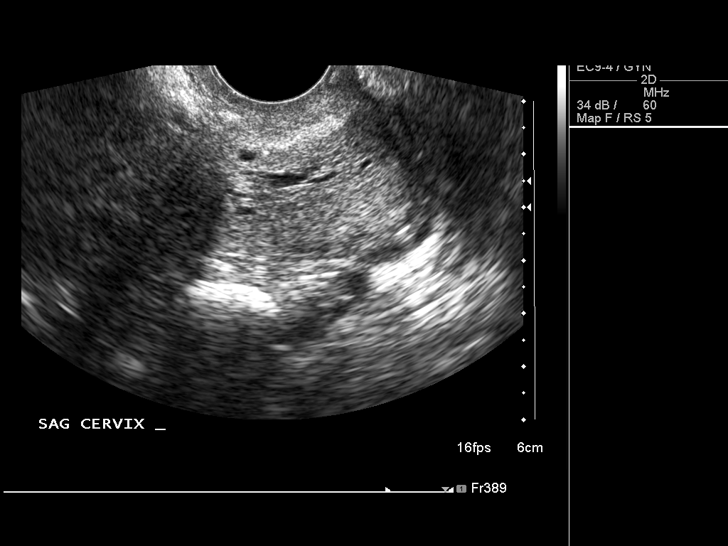
[im 34/81]
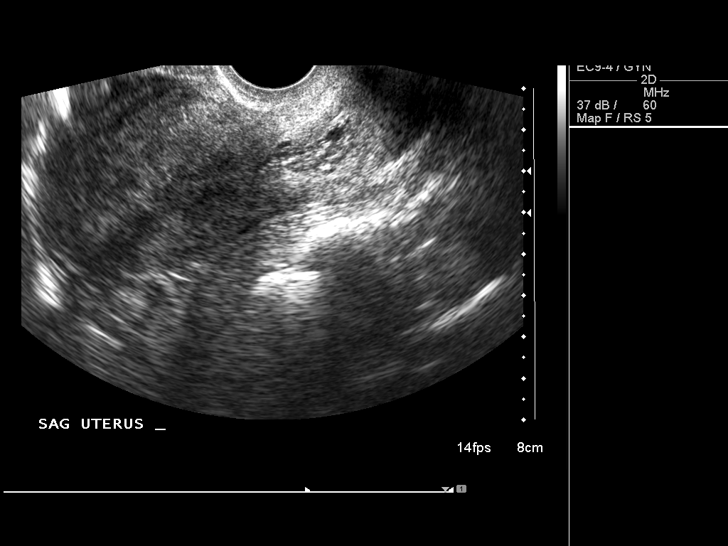
[im 41/81]
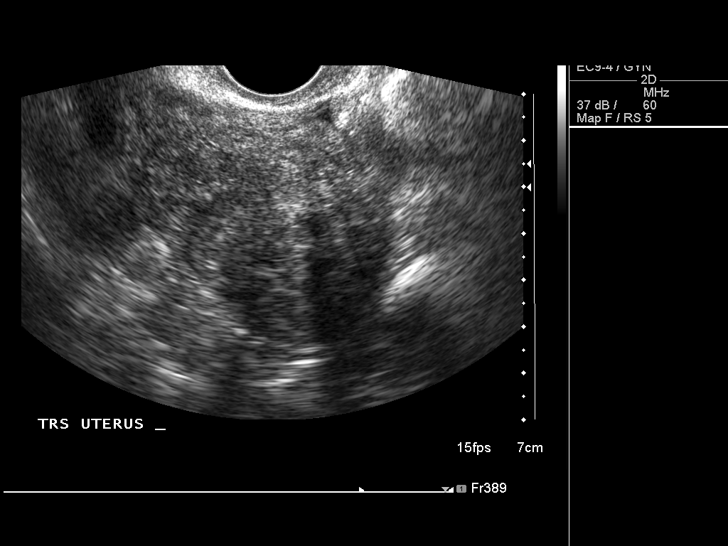
[im 47/81]
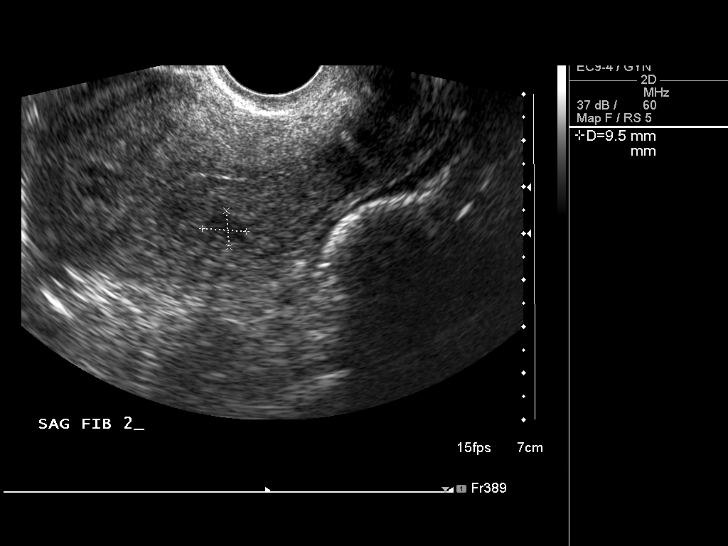
[im 54/81]
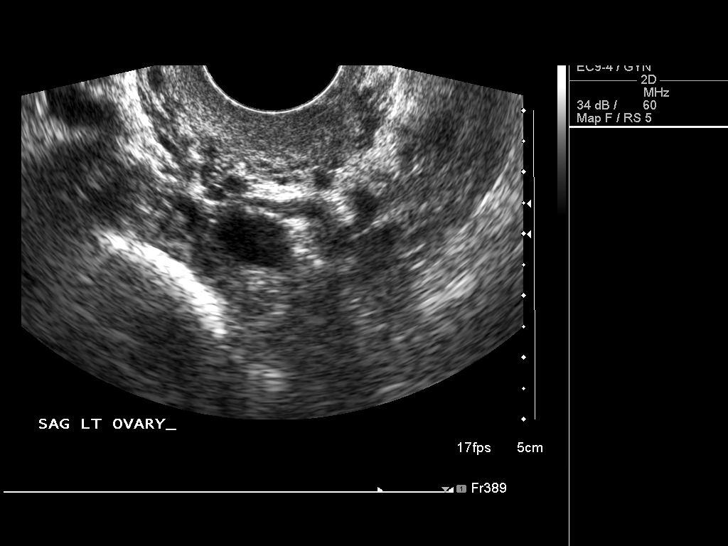
[im 61/81]
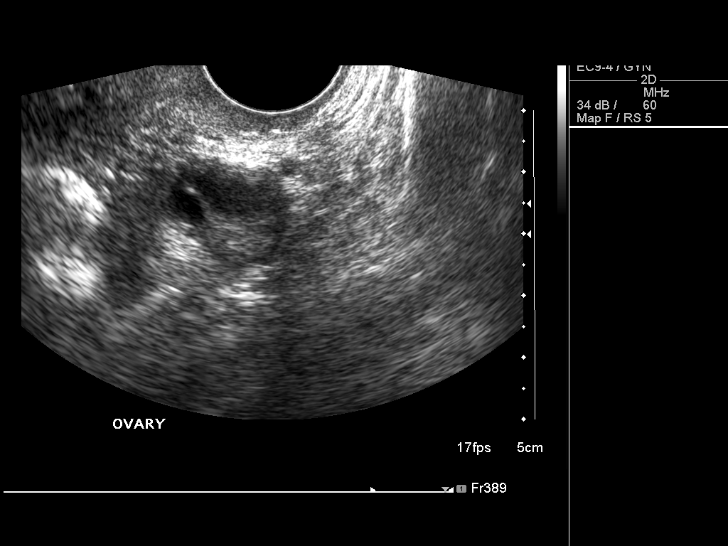
[im 67/81]
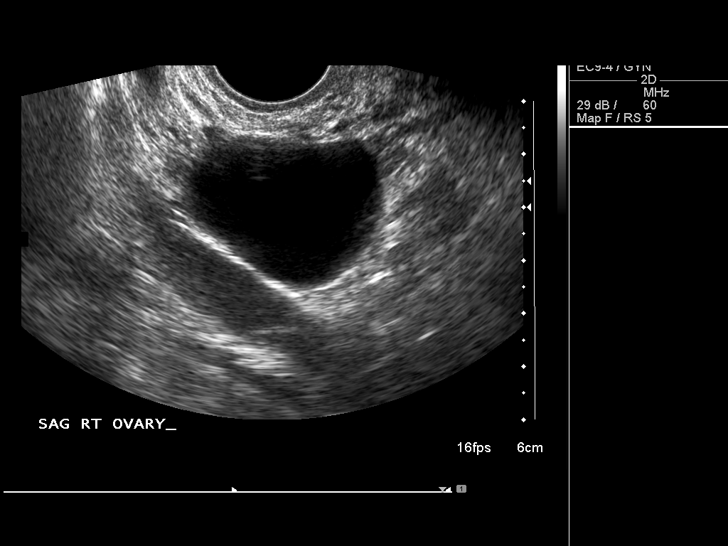
[im 74/81]
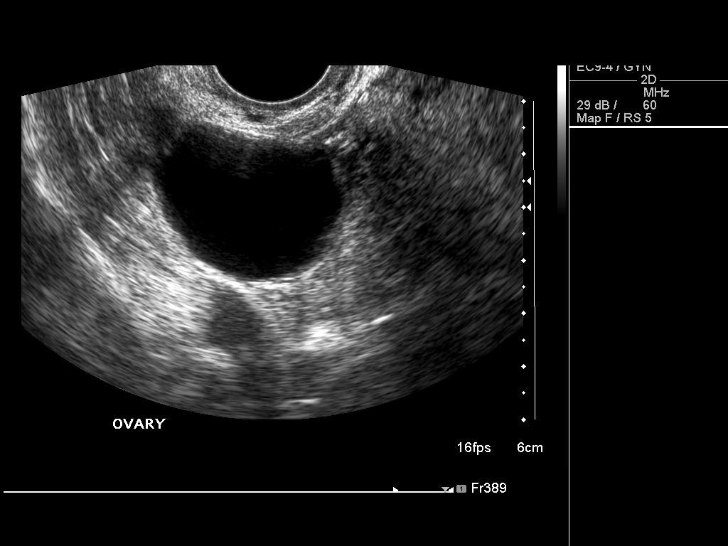
[im 81/81]
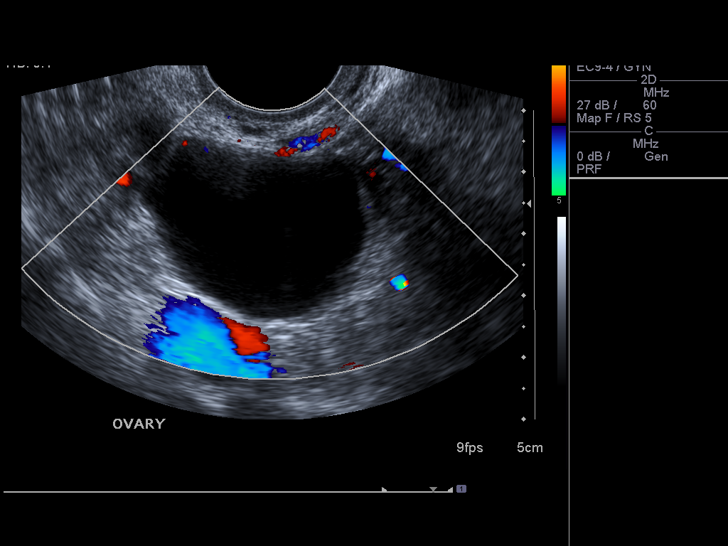

[13 of 25 positions shown; findings below may reference images not displayed]

FINDINGS: Uterus:  10.1 x 4.7 x 5.7 cm.  An intramural fibroid is seen in the
right fundal region which measures 3.2 x 2.9 x 2.9 cm.  A second
intramural fibroid is seen in the right posterior uterine body
measuring 1.0 cm in maximum diameter.

Endometrium:  4 mm double layer thickness measured transvaginally.

Right ovary:  4.6 x 3.5 x 3.8 cm.  Simple cyst measuring 3.7 x
x 3.4 cm, which has benign characteristics.

Left ovary:  2.6 x 2.6 x 2.1 cm.  Normal appearance/no adnexal
mass.

Other findings:  No free fluid.
IMPRESSION: 1.  Two uterine fibroids, largest measuring 3.2 cm.  Normal
endometrial thickness.
2.  3.7 cm simple right ovarian cyst, which has benign features and
is likely physiologic.
3.  Normal left ovary.

## 2012-10-09 ENCOUNTER — Telehealth (INDEPENDENT_AMBULATORY_CARE_PROVIDER_SITE_OTHER): Payer: Self-pay | Admitting: Surgery

## 2012-10-09 NOTE — Telephone Encounter (Signed)
I mailed 09/13/12 a recall letter for bariatric surgery annual follow-up to patient. I advised the patient to call [facility] @ [phone] to schedule an appointment. [facility]

## 2012-10-19 ENCOUNTER — Telehealth: Payer: Self-pay | Admitting: *Deleted

## 2012-10-19 MED ORDER — NYSTATIN 100000 UNIT/ML MT SUSP: 500000.0000 [IU] | Freq: Four times a day (QID) | OROMUCOSAL | Status: DC

## 2012-10-19 NOTE — Telephone Encounter (Signed)
Prescription over time as her [place]. Please call me if symptoms are not improving over the next week.

## 2012-10-19 NOTE — Telephone Encounter (Signed)
Has thrush in mouth and wants to know if can get a mouthwash for it. Used to use mouthwash in past when she would get thrush but she doesn't remember the name- states she used to swish it around and it would make mouth feel better.

## 2012-10-20 ENCOUNTER — Ambulatory Visit: Payer: 59 | Admitting: Sports Medicine

## 2012-10-31 ENCOUNTER — Telehealth: Payer: Self-pay | Admitting: *Deleted

## 2012-10-31 DIAGNOSIS — G43909 Migraine, unspecified, not intractable, without status migrainosus: Secondary | ICD-10-CM

## 2012-10-31 MED ORDER — TOPIRAMATE 25 MG PO TABS: 25.0000 mg | ORAL_TABLET | Freq: Three times a day (TID) | ORAL | Status: DC

## 2012-10-31 MED ORDER — TOPIRAMATE 50 MG PO TABS: 50.0000 mg | ORAL_TABLET | Freq: Three times a day (TID) | ORAL | Status: DC

## 2012-10-31 NOTE — Telephone Encounter (Signed)
Pt called wanting to see if her Topamax could be increase due to a daily headache and she is taking her Imitrex also.  Spoke with Remonia Richter, NP who ok'd  The RX to be changed to 25mg  TID.

## 2012-10-31 NOTE — Telephone Encounter (Signed)
Pt is to increase her Topamax to 50mg  TID instead of 25mg  TID.

## 2012-11-17 ENCOUNTER — Encounter: Payer: Self-pay | Admitting: Family Medicine

## 2012-11-17 ENCOUNTER — Ambulatory Visit (INDEPENDENT_AMBULATORY_CARE_PROVIDER_SITE_OTHER): Payer: 59 | Admitting: Family Medicine

## 2012-11-17 VITALS — BP 124/80 | HR 62 | Temp 97.7°F | Ht 66.6 in | Wt 206.0 lb

## 2012-11-17 DIAGNOSIS — Z0000 Encounter for general adult medical examination without abnormal findings: Secondary | ICD-10-CM

## 2012-11-17 DIAGNOSIS — R928 Other abnormal and inconclusive findings on diagnostic imaging of breast: Secondary | ICD-10-CM

## 2012-11-17 DIAGNOSIS — R202 Paresthesia of skin: Secondary | ICD-10-CM

## 2012-11-17 DIAGNOSIS — R209 Unspecified disturbances of skin sensation: Secondary | ICD-10-CM

## 2012-11-17 DIAGNOSIS — M2550 Pain in unspecified joint: Secondary | ICD-10-CM

## 2012-11-17 DIAGNOSIS — Z23 Encounter for immunization: Secondary | ICD-10-CM

## 2012-11-17 NOTE — Progress Notes (Signed)
Subjective:    Patient ID: Rose Long, female    DOB: [birth year], 52 y.o.   MRN: [id number]  HPI   Review of Systems     Objective:   Physical Exam        Assessment & Plan:   Subjective:     Rose Long is a 52 y.o. female and is here for a comprehensive physical exam. The patient reports no problems.  No regular exercise right now.   Has lost  9lbs in the last 6 weeks.    Also having burning in her feet. Has been coming and going on and off for several months. No known triggers. Doesn't seem to happen just at night or with exercise. She's never had problems with this before. No known injuries to her legs or her back. She does take a B12 supplement. No prior history of diabetes.  Rheumatoid arthritis-she says she was diagnosed with rheumatoid arthritis as a child. Didn't years ago she was told by her primary care physician that she didn't have it. She's not currently seeing a rheumatologist or specialist for this. She does have diffuse joint pain meds. She says they do swell at times. She is particularly having problems with her left knee. 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 She has already seeing the orthopedist a couple years ago and they had recommended possible knee replacement some point in time because of the valgus deformity.  History   Social History  . Marital Status: Single    Spouse Name: N/A    Number of Children: N/A  . Years of Education: Masters   Occupational History  . 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     Social History Main Topics  . Smoking status: Never Smoker   . Smokeless tobacco: Never Used  . Alcohol Use: 1.0 oz/week    2 drink(s) per week     Comment: socially  . Drug Use: No  . Sexually Active: Not Currently   Other Topics Concern  . Not on file   Social History Narrative   Parents moved into her home on June 2013.  2 caffeine drinks per day.  Works out 1 hour 5-6 days per week.    Health Maintenance   Topic Date Due  . Tetanus/tdap  06/19/1981  . Mammogram  06/19/2012  . Colonoscopy  06/19/2012  . Influenza Vaccine  06/18/2013  . Pap Smear  03/15/2014    The following portions of the patient's history were reviewed and updated as appropriate: allergies, current medications, past family history, past medical history, past social history, past surgical history and problem list.  Review of Systems A comprehensive review of systems was negative.   Objective:    BP 124/80  Pulse 62  Temp 97.7 F (36.5 C) (Oral)  Ht 5' 6.6" (1.692 m)  Wt 206 lb (93.441 kg)  BMI 32.65 kg/m2 General appearance: alert, cooperative, appears stated age and moderately obese Head: Normocephalic, without obvious abnormality, atraumatic Eyes: conj clear, EOMi, PEERLA Ears: normal TM's and external ear canals both ears Nose: Nares normal. Septum midline. Mucosa normal. No drainage or sinus tenderness. Throat: lips, mucosa, and tongue normal; teeth and gums normal Neck: no adenopathy, no carotid bruit, no JVD, supple, symmetrical, trachea midline and thyroid not enlarged, symmetric, no tenderness/mass/nodules Back: symmetric, no curvature. ROM normal. No CVA tenderness. Lungs: clear to auscultation bilaterally Breasts: Inspection negative, No nipple retraction or dimpling, No axillary or supraclavicular adenopathy, positive findings: approx 1 cm irregular  lump at the  10 o'clock position along the edge of the nipple.  Heart: regular rate and rhythm, S1, S2 normal, no murmur, click, rub or gallop Abdomen: soft, non-tender; bowel sounds normal; no masses,  no organomegaly Extremities: extremities normal, atraumatic, no cyanosis or edema Pulses: 2+ and symmetric Skin: Skin color, texture, turgor normal. No rashes or lesions Lymph nodes: Cervical, supraclavicular, and axillary nodes normal. Neurologic: Alert and oriented X 3, normal strength and tone. Normal symmetric reflexes. Normal coordination and gait     Assessment:    Healthy female exam.      Plan:     See After Visit Summary for Counseling Recommendations  Keep up a regular exercise program and make sure you are eating a healthy diet Try to eat 4 servings of dairy a day, or if you are lactose intolerant take a calcium with vitamin D daily.  Your vaccines are up to date.   Breast Lump - Will schedule for diagnostic mammogram.  She is changing her insurance tomorrow. She will come in to see who is in her network. She has undergone imaging location right across from her new workplace.  Tdap updated today.  Discussed need for screening colonoscopy. She would definitely like to avoid that but wants to find out who is covered in her network with her new insurance once the changes of her. She will call her office back.  Recommend followup with her OB/GYN for repeat Pap smear seen.  Possible rheumatoid arthritis - Will order CCP. If + then I would really like her to see a rheumatologist to prevent progression of disease.

## 2012-11-17 NOTE — Patient Instructions (Addendum)
Keep up a regular exercise program and make sure you are eating a healthy diet Try to eat 4 servings of dairy a day, or if you are lactose intolerant take a calcium with vitamin D daily.  Your vaccines are up to date.  Please come in when she's been noticing a network so we can schedule a diagnostic mammogram. Would like to better evaluate the area in your left breast. We will call you with your lab results. If you don't here from [place] in about a week then please give [place] a call at [phone].

## 2012-11-21 NOTE — Progress Notes (Signed)
Pt scheduled for Diagnostic mammogram with [facility] on 11/29/2012 @ 915. Pt notified and agreed to appt time and date order faxed to [phone].Laureen Ochs, Viann Shove

## 2012-11-21 NOTE — Addendum Note (Signed)
Addended by: Deno Etienne on: 11/21/2012 04:46 PM   Modules accepted: Orders

## 2012-11-29 ENCOUNTER — Telehealth: Payer: Self-pay | Admitting: Family Medicine

## 2012-11-29 ENCOUNTER — Other Ambulatory Visit: Payer: Self-pay | Admitting: *Deleted

## 2012-11-29 DIAGNOSIS — R928 Other abnormal and inconclusive findings on diagnostic imaging of breast: Secondary | ICD-10-CM

## 2012-11-29 LAB — CBC WITH DIFFERENTIAL/PLATELET
Basophils Absolute: 0 10*3/uL (ref 0.0–0.1)
Basophils Relative: 1 % (ref 0–1)
Eosinophils Absolute: 0.1 10*3/uL (ref 0.0–0.7)
Eosinophils Relative: 2 % (ref 0–5)
HCT: 40.3 % (ref 36.0–46.0)
MCHC: 33 g/dL (ref 30.0–36.0)
MCV: 87.2 fL (ref 78.0–100.0)
Monocytes Absolute: 0.3 10*3/uL (ref 0.1–1.0)
Neutro Abs: 3.6 10*3/uL (ref 1.7–7.7)
RDW: 13.7 % (ref 11.5–15.5)

## 2012-11-29 LAB — FOLATE: Folate: >20 ng/mL

## 2012-11-29 LAB — COMPLETE METABOLIC PANEL WITH GFR
Albumin: 4.2 g/dL (ref 3.5–5.2)
Alkaline Phosphatase: 56 U/L (ref 39–117)
CO2: 26 mEq/L (ref 19–32)
Chloride: 107 mEq/L (ref 96–112)
GFR, Est Non African American: >89 mL/min
Glucose, Bld: 85 mg/dL (ref 70–99)
Potassium: 4.5 mEq/L (ref 3.5–5.3)
Sodium: 140 mEq/L (ref 135–145)
Total Protein: 7 g/dL (ref 6.0–8.3)

## 2012-11-29 LAB — LIPID PANEL
HDL: 53 mg/dL (ref >39)
LDL Cholesterol: 125 mg/dL — ABNORMAL HIGH (ref 0–99)
Triglycerides: 71 mg/dL (ref <150)

## 2012-11-29 NOTE — Telephone Encounter (Signed)
Rose Long called from [facility] and states that we sent this pt for disgnostics and the Dr. There is recommending a U/S be faxed to their dept at [facility] [phone]. Thanks

## 2012-12-01 ENCOUNTER — Other Ambulatory Visit: Payer: Self-pay | Admitting: Family Medicine

## 2012-12-01 LAB — CYCLIC CITRUL PEPTIDE ANTIBODY, IGG: Cyclic Citrullin Peptide Ab: <2 U/mL (ref 0.0–5.0)

## 2012-12-15 ENCOUNTER — Telehealth: Payer: Self-pay | Admitting: *Deleted

## 2012-12-15 NOTE — Telephone Encounter (Signed)
Pt called in to get lab results.  We sent her a letter telling her that we were unable to reach her so she called in & is now notified of results & suggestions

## 2013-02-16 ENCOUNTER — Other Ambulatory Visit: Payer: Self-pay | Admitting: Family Medicine

## 2013-04-26 ENCOUNTER — Encounter: Payer: Self-pay | Admitting: *Deleted

## 2013-05-04 ENCOUNTER — Ambulatory Visit: Payer: Managed Care, Other (non HMO) | Admitting: Sports Medicine

## 2013-05-04 ENCOUNTER — Ambulatory Visit (INDEPENDENT_AMBULATORY_CARE_PROVIDER_SITE_OTHER): Payer: Managed Care, Other (non HMO) | Admitting: Sports Medicine

## 2013-05-04 ENCOUNTER — Encounter: Payer: Self-pay | Admitting: Sports Medicine

## 2013-05-04 VITALS — BP 124/70 | HR 67 | Wt 208.0 lb

## 2013-05-04 DIAGNOSIS — M1712 Unilateral primary osteoarthritis, left knee: Secondary | ICD-10-CM

## 2013-05-04 DIAGNOSIS — M7750 Other enthesopathy of unspecified foot: Secondary | ICD-10-CM

## 2013-05-04 DIAGNOSIS — M1711 Unilateral primary osteoarthritis, right knee: Secondary | ICD-10-CM | POA: Insufficient documentation

## 2013-05-04 DIAGNOSIS — M7742 Metatarsalgia, left foot: Secondary | ICD-10-CM | POA: Insufficient documentation

## 2013-05-04 DIAGNOSIS — M1710 Unilateral primary osteoarthritis, unspecified knee: Secondary | ICD-10-CM

## 2013-05-04 DIAGNOSIS — IMO0002 Reserved for concepts with insufficient information to code with codable children: Secondary | ICD-10-CM

## 2013-05-04 MED ORDER — MELOXICAM 15 MG PO TABS: ORAL_TABLET | ORAL | Status: DC

## 2013-05-04 NOTE — Progress Notes (Signed)
   Subjective:    I'm seeing this patient as a consultation for:  Dr. Linford Arnold  CC: Left knee pain  HPI: This is a very pleasant 52 year old female with what sounds to be seronegative rheumatoid arthritis, who comes in with a long history of left knee and left foot pain. 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 She has had oral medications, but no therapy, and no injections. Multiple serologies have been negative for rheumatoid arthritis. Pain is localized, doesn't radiate, moderate. She also has pain she localizes over the metatarsal head of her left foot. It's worse with ambulation, better with rest.  Past medical history, Surgical history, Family history not pertinant except as noted below, Social history, Allergies, and medications have been entered into the medical record, reviewed, and no changes needed.   Review of Systems: No headache, visual changes, nausea, vomiting, diarrhea, constipation, dizziness, abdominal pain, skin rash, fevers, chills, night sweats, weight loss, swollen lymph nodes, body aches, joint swelling, muscle aches, chest pain, shortness of breath, mood changes, visual or auditory hallucinations.   Objective:   General: Well Developed, well nourished, and in no acute distress.  Neuro/Psych: Alert and oriented x3, extra-ocular muscles intact, able to move all 4 extremities, sensation grossly intact. Skin: Warm and dry, no rashes noted.  Respiratory: Not using accessory muscles, speaking in full sentences, trachea midline.  Cardiovascular: Pulses palpable, no extremity edema. Abdomen: Does not appear distended. Left Knee: Valgus deformity, tender to palpation at the lateral joint line. ROM full in flexion and extension and lower leg rotation. Ligaments with solid consistent endpoints including ACL, PCL, LCL, MCL. Negative Mcmurray's, Apley's, and Thessalonian tests. Non painful patellar  compression. Patellar glide without crepitus. Patellar and quadriceps tendons unremarkable. Hamstring and quadriceps strength is normal.  Left Foot: No visible erythema or swelling. Range of motion is full in all directions. Strength is 5/5 in all directions. No hallux valgus. Abnormal callus noted under the third metatarsal head. Breakdown of the transverse arch was seen, she does have bilateral pes cavus. No pain over the navicular prominence, or base of fifth metatarsal. No tenderness to palpation of the calcaneal insertion of plantar fascia. No pain at the Achilles insertion. No pain over the calcaneal bursa. No pain of the retrocalcaneal bursa. No tenderness to palpation over the tarsals, metatarsals, or phalanges. No hallux rigidus or limitus. No tenderness palpation over interphalangeal joints. No pain with compression of the metatarsal heads. Neurovascularly intact distally.  Impression and Recommendations:   This case required medical decision making of moderate complexity.

## 2013-05-04 NOTE — Assessment & Plan Note (Signed)
Come by for custom orthotics likely with metatarsal pads.

## 2013-05-04 NOTE — Assessment & Plan Note (Signed)
Start conservatively with home exercises, meloxicam. Return in 4 weeks for this, if no better will consider intra-articular treatment.

## 2013-05-21 ENCOUNTER — Other Ambulatory Visit: Payer: Self-pay | Admitting: Nurse Practitioner

## 2013-05-21 ENCOUNTER — Encounter: Payer: Self-pay | Admitting: Sports Medicine

## 2013-05-21 ENCOUNTER — Ambulatory Visit (INDEPENDENT_AMBULATORY_CARE_PROVIDER_SITE_OTHER): Payer: Managed Care, Other (non HMO) | Admitting: Sports Medicine

## 2013-05-21 VITALS — BP 118/78 | HR 76 | Wt 204.0 lb

## 2013-05-21 DIAGNOSIS — M545 Low back pain, unspecified: Secondary | ICD-10-CM

## 2013-05-21 DIAGNOSIS — M1710 Unilateral primary osteoarthritis, unspecified knee: Secondary | ICD-10-CM

## 2013-05-21 DIAGNOSIS — M1712 Unilateral primary osteoarthritis, left knee: Secondary | ICD-10-CM

## 2013-05-21 DIAGNOSIS — M7750 Other enthesopathy of unspecified foot: Secondary | ICD-10-CM

## 2013-05-21 DIAGNOSIS — IMO0002 Reserved for concepts with insufficient information to code with codable children: Secondary | ICD-10-CM

## 2013-05-21 DIAGNOSIS — M7742 Metatarsalgia, left foot: Secondary | ICD-10-CM

## 2013-05-21 MED ORDER — PREDNISONE 50 MG PO TABS: ORAL_TABLET | ORAL | Status: DC

## 2013-05-21 NOTE — Assessment & Plan Note (Signed)
Rose Long has not yet gotten her Mobic. Like to see her back in approximately 4 weeks, we can consider an injection if no better.

## 2013-05-21 NOTE — Assessment & Plan Note (Signed)
Symptoms likely represent classic lumbar spinal stenosis. Formal physical therapy, Mobic, prednisone. She will get x-rays from her chiropractor, but these are not useful I can get actual x-rays.

## 2013-05-21 NOTE — Progress Notes (Signed)
   Patient was fitted for a : standard, cushioned, semi-rigid orthotic. The orthotic was heated and afterward the patient stood on the orthotic blank positioned on the orthotic stand. The patient was positioned in subtalar neutral position and 10 degrees of ankle dorsiflexion in a weight bearing stance. After completion of molding, a stable base was applied to the orthotic blank. The blank was ground to a stable position for weight bearing. Size: 8 Base: Blue EVA Additional Posting and Padding: left sided Metatarsal pad. The patient ambulated these, and they were very comfortable.  I spent 40 minutes with this patient, greater than 50% was face-to-face time counseling regarding the diagnosis of metatarsalgia.  Impression and Recommendations:

## 2013-05-21 NOTE — Assessment & Plan Note (Signed)
Custom orthotics as above. Metatarsal pad placed. Return as needed for this.

## 2013-05-24 ENCOUNTER — Ambulatory Visit (INDEPENDENT_AMBULATORY_CARE_PROVIDER_SITE_OTHER): Payer: Managed Care, Other (non HMO) | Admitting: Family Medicine

## 2013-05-24 ENCOUNTER — Encounter: Payer: Self-pay | Admitting: Family Medicine

## 2013-05-24 VITALS — BP 124/82 | HR 67 | Ht 66.6 in | Wt 203.0 lb

## 2013-05-24 DIAGNOSIS — L0292 Furuncle, unspecified: Secondary | ICD-10-CM

## 2013-05-24 DIAGNOSIS — G4700 Insomnia, unspecified: Secondary | ICD-10-CM

## 2013-05-24 DIAGNOSIS — Z113 Encounter for screening for infections with a predominantly sexual mode of transmission: Secondary | ICD-10-CM

## 2013-05-24 DIAGNOSIS — L0293 Carbuncle, unspecified: Secondary | ICD-10-CM

## 2013-05-24 DIAGNOSIS — N951 Menopausal and female climacteric states: Secondary | ICD-10-CM

## 2013-05-24 DIAGNOSIS — R232 Flushing: Secondary | ICD-10-CM

## 2013-05-24 DIAGNOSIS — L680 Hirsutism: Secondary | ICD-10-CM

## 2013-05-24 MED ORDER — ZOLPIDEM TARTRATE 5 MG PO TABS: 5.0000 mg | ORAL_TABLET | Freq: Every evening | ORAL | Status: DC | PRN

## 2013-05-24 MED ORDER — SPIRONOLACTONE 50 MG PO TABS: ORAL_TABLET | ORAL | Status: DC

## 2013-05-24 NOTE — Progress Notes (Addendum)
Subjective:    Patient ID: Rose Long, female    DOB: 07/10/1961, 52 y.o.   MRN: [id number]  HPI 1) wants STd testing. Had unprotected intercourse. Noticed some bumps about a week after interncourse.  She denies any pelvic pain or discharge. The bumps have resolved.  2) Wans a recommendation for someone to do a tummy tuck?  Blepharoplasty?   3)Chronic insomnia - Now on lunesta 3mg . she has tried to avoid addictive medications for sleep. But reports that she has had problems with insomnia since being a child. Using tizanidine at bedtime as well for her migraines. If she takes a muscle relaxer and the Lunesta together she only gets about 3 solid hours.  If she takes Lunesta by itself, she only gets about 2 hours.  4) Hyradenitis suppuritiva - will get boils in underarms and groin area. Wants to know what new tx on the market. She is this on and off for years.  5)Hot flashes - have been getting wrose. What are options for tx?  Hasn't tried anything yet. Constantly feels hot.    6) Hair growth on her chin. She says she has been several thousand dollars on electrolytes is in laser hair therapy with only minimal results. She wants and if they're any other possible treatments.   Review of Systems     Objective:   Physical Exam  Constitutional: She is oriented to person, place, and time. She appears well-developed and well-nourished.  HENT:  Head: Normocephalic and atraumatic.  Neurological: She is alert and oriented to person, place, and time.  Skin: Skin is warm and dry.  Psychiatric: She has a normal mood and affect. Her behavior is normal.          Assessment & Plan:  1-STD screen. No active lesions ot eval today. If any new lesions occur then please come in and we can evaluate them. We'll check her HIV, syphilis, acute hepatitis and herpes simplex especially since she noticed lesions after intercourse about a week later. Certainly if she develops any new lesions she is to come  in and have been examined and possibly culture.  2-Recommendation for plastic surgery.   3-Chronic insomnia- .the Alfonso Patten has not been affected. She's had chronic insomnia since she was a child. She has never tried Ambien. She is headed to stay away from medications are habit-forming. Discussed trial of ambein. I do think it can be helpful for her but can definitely cause dependency especially after taking it consistently for a month. Has never tried it.  Will start with 5g based on new dosing guidelines for females.. Can call if need to inc to 10mg  or wants to try the CR.  4- hiradenitis suppurativa - I will look into the most up-to-date treatments include her back with additional information.  5-Hotflashes - we discussed different treatment options. Over-the-counter she could certainly start with black cohosh her evening primrose oil. We could also consider hormone replacement therapy. She has not had a hysterectomy so would need to be on combination therapy. We did discuss increased risk of breast cancer and heart disease on medication and that we need to be used for a fairly short period of time. We also discussed Effexor. Actually hasn't been evidence for treating hot flashes. She said she was on this years ago and actually gained about 30 pounds on it and so is not interested in taking it again.  6- Hirsutism - we can certainly opt to treat with spironolactone. Explained her that  may take several months to see results. He can also lower blood pressure we will need to monitor her electrolytes.  Time spent 30 min, >50% spent counselng about STD screening, chronic insomnia, hiradenitis suppurativa, hot flashes, hirsutism

## 2013-05-24 NOTE — Patient Instructions (Signed)
Can try Rose Long

## 2013-05-25 ENCOUNTER — Encounter: Payer: Self-pay | Admitting: Family Medicine

## 2013-05-25 LAB — HSV(HERPES SIMPLEX VRS) I + II AB-IGG
HSV 1 Glycoprotein G Ab, IgG: 0.11 IV
HSV 2 Glycoprotein G Ab, IgG: <0.1 IV

## 2013-05-25 LAB — HEPATITIS PANEL, ACUTE
HCV Ab: NEGATIVE
Hep A IgM: NEGATIVE
Hep B C IgM: NEGATIVE

## 2013-05-28 ENCOUNTER — Other Ambulatory Visit: Payer: Self-pay | Admitting: *Deleted

## 2013-05-28 DIAGNOSIS — M62838 Other muscle spasm: Secondary | ICD-10-CM

## 2013-05-28 DIAGNOSIS — G43909 Migraine, unspecified, not intractable, without status migrainosus: Secondary | ICD-10-CM

## 2013-05-28 MED ORDER — TIZANIDINE HCL 4 MG PO TABS: 4.0000 mg | ORAL_TABLET | Freq: Every day | ORAL | Status: DC

## 2013-05-28 NOTE — Telephone Encounter (Signed)
Refill request authorized for Zanaflex 4 mg sent to [facility] per Jannifer Rodney NP

## 2013-05-30 ENCOUNTER — Ambulatory Visit: Payer: Managed Care, Other (non HMO) | Admitting: Physical Therapy

## 2013-06-01 ENCOUNTER — Telehealth: Payer: Self-pay | Admitting: Family Medicine

## 2013-06-01 NOTE — Telephone Encounter (Signed)
Please call patient cell phone. I did check into treatment for her hidradenitis suppurativa.  Currently main treatment are topical clindamycin. This is actually a topical antibiotic. It is helpful for mild inflammatory lesions. Also sometimes systemic antibiotics such as doxycycline or minocycline can be used. This is similar to what we use and some acne patients. They can be used for brief period, such as 7-10 days a time to quiet acute flares in more mild disease. Sometime steroid injections into the lesions and surgery can be done as well for more severe disease.

## 2013-06-04 NOTE — Telephone Encounter (Signed)
Pt informed about treatments. she wanted to know if you were going to call this in for her, or what should now be done Also pt wanted to let you know the Ambien is not currently working well for her and would like to be switch to the Ambien CR.

## 2013-06-05 MED ORDER — ZOLPIDEM TARTRATE ER 12.5 MG PO TBCR: 12.5000 mg | EXTENDED_RELEASE_TABLET | Freq: Every evening | ORAL | Status: DC | PRN

## 2013-06-05 MED ORDER — CLINDAMYCIN PHOSPHATE 1 % EX GEL: CUTANEOUS | Status: AC

## 2013-06-05 NOTE — Telephone Encounter (Signed)
Pt informed.Rose Long  

## 2013-06-05 NOTE — Telephone Encounter (Signed)
Will send in topical clndamycin since she is ok with it and will fax over [place] [facility]

## 2013-06-12 ENCOUNTER — Encounter: Payer: Self-pay | Admitting: Sports Medicine

## 2013-06-12 ENCOUNTER — Ambulatory Visit (INDEPENDENT_AMBULATORY_CARE_PROVIDER_SITE_OTHER): Payer: Managed Care, Other (non HMO) | Admitting: Sports Medicine

## 2013-06-12 VITALS — BP 114/76 | HR 63 | Wt 202.0 lb

## 2013-06-12 DIAGNOSIS — M1710 Unilateral primary osteoarthritis, unspecified knee: Secondary | ICD-10-CM

## 2013-06-12 DIAGNOSIS — M7061 Trochanteric bursitis, right hip: Secondary | ICD-10-CM | POA: Insufficient documentation

## 2013-06-12 DIAGNOSIS — IMO0002 Reserved for concepts with insufficient information to code with codable children: Secondary | ICD-10-CM

## 2013-06-12 DIAGNOSIS — M76899 Other specified enthesopathies of unspecified lower limb, excluding foot: Secondary | ICD-10-CM

## 2013-06-12 DIAGNOSIS — M1712 Unilateral primary osteoarthritis, left knee: Secondary | ICD-10-CM

## 2013-06-12 NOTE — Assessment & Plan Note (Signed)
Continue NSAIDs, home exercises, injection if no better.

## 2013-06-12 NOTE — Progress Notes (Signed)
  Subjective:    CC: Followup  HPI: Left knee pain: Osteoarthritis, we have tried custom orthotics, oral medications, and rehabilitation, none none have helped.  She still has pain at the joint lines, worse with trying to extend the knee, persistent, moderate.  Bilateral hip pain: Localized over the greater trochanters.  Past medical history, Surgical history, Family history not pertinant except as noted below, Social history, Allergies, and medications have been entered into the medical record, reviewed, and no changes needed.   Review of Systems: No fevers, chills, night sweats, weight loss, chest pain, or shortness of breath.   Objective:    General: Well Developed, well nourished, and in no acute distress.  Neuro: Alert and oriented x3, extra-ocular muscles intact, sensation grossly intact.  HEENT: Normocephalic, atraumatic, pupils equal round reactive to light, neck supple, no masses, no lymphadenopathy, thyroid nonpalpable.  Skin: Warm and dry, no rashes. Cardiac: Regular rate and rhythm, no murmurs rubs or gallops, no lower extremity edema.  Respiratory: Clear to auscultation bilaterally. Not using accessory muscles, speaking in full sentences.  Procedure: Real-time Ultrasound Guided Injection of left knee Device: GE Logiq E  Verbal informed consent obtained.  Time-out conducted.  Noted no overlying erythema, induration, or other signs of local infection.  Skin prepped in a sterile fashion.  Local anesthesia: Topical Ethyl chloride.  With sterile technique and under real time ultrasound guidance:  25-gauge needle advanced into the suprapatellar recess, 2 cc Kenalog 40, 4 cc lidocaine injected easily. Completed without difficulty  Pain immediately resolved suggesting accurate placement of the medication.  Advised to call if fevers/chills, erythema, induration, drainage, or persistent bleeding.  Images permanently stored and available for review in the ultrasound unit.    Impression: Technically successful ultrasound guided injection.  Impression and Recommendations:

## 2013-06-12 NOTE — Assessment & Plan Note (Signed)
No improvement with NSAIDs or custom orthotics. Ultrasound guided injection as above. Return in 4 weeks to see how things are going.

## 2013-06-20 ENCOUNTER — Ambulatory Visit (INDEPENDENT_AMBULATORY_CARE_PROVIDER_SITE_OTHER): Payer: Managed Care, Other (non HMO) | Admitting: Physical Therapy

## 2013-06-20 DIAGNOSIS — M545 Low back pain: Secondary | ICD-10-CM

## 2013-06-20 DIAGNOSIS — M2550 Pain in unspecified joint: Secondary | ICD-10-CM

## 2013-06-20 DIAGNOSIS — M6281 Muscle weakness (generalized): Secondary | ICD-10-CM

## 2013-06-20 DIAGNOSIS — M2560 Stiffness of unspecified joint, not elsewhere classified: Secondary | ICD-10-CM

## 2013-06-20 DIAGNOSIS — M1710 Unilateral primary osteoarthritis, unspecified knee: Secondary | ICD-10-CM

## 2013-07-02 ENCOUNTER — Encounter: Payer: Managed Care, Other (non HMO) | Admitting: Physical Therapy

## 2013-07-02 DIAGNOSIS — M1710 Unilateral primary osteoarthritis, unspecified knee: Secondary | ICD-10-CM

## 2013-07-02 DIAGNOSIS — M6281 Muscle weakness (generalized): Secondary | ICD-10-CM

## 2013-07-02 DIAGNOSIS — M2560 Stiffness of unspecified joint, not elsewhere classified: Secondary | ICD-10-CM

## 2013-07-02 DIAGNOSIS — M545 Low back pain: Secondary | ICD-10-CM

## 2013-07-02 DIAGNOSIS — M2550 Pain in unspecified joint: Secondary | ICD-10-CM

## 2013-07-05 ENCOUNTER — Encounter: Payer: Managed Care, Other (non HMO) | Admitting: Physical Therapy

## 2013-07-09 ENCOUNTER — Encounter (INDEPENDENT_AMBULATORY_CARE_PROVIDER_SITE_OTHER): Payer: Managed Care, Other (non HMO) | Admitting: Physical Therapy

## 2013-07-09 ENCOUNTER — Other Ambulatory Visit: Payer: Self-pay | Admitting: Family Medicine

## 2013-07-09 DIAGNOSIS — M1710 Unilateral primary osteoarthritis, unspecified knee: Secondary | ICD-10-CM

## 2013-07-09 DIAGNOSIS — M2560 Stiffness of unspecified joint, not elsewhere classified: Secondary | ICD-10-CM

## 2013-07-09 DIAGNOSIS — M2550 Pain in unspecified joint: Secondary | ICD-10-CM

## 2013-07-09 DIAGNOSIS — M545 Low back pain: Secondary | ICD-10-CM

## 2013-07-09 DIAGNOSIS — M6281 Muscle weakness (generalized): Secondary | ICD-10-CM

## 2013-07-10 ENCOUNTER — Ambulatory Visit (INDEPENDENT_AMBULATORY_CARE_PROVIDER_SITE_OTHER): Payer: Managed Care, Other (non HMO) | Admitting: Sports Medicine

## 2013-07-10 ENCOUNTER — Encounter: Payer: Self-pay | Admitting: Sports Medicine

## 2013-07-10 VITALS — BP 132/80 | HR 84 | Wt 198.0 lb

## 2013-07-10 DIAGNOSIS — M7061 Trochanteric bursitis, right hip: Secondary | ICD-10-CM

## 2013-07-10 DIAGNOSIS — M76899 Other specified enthesopathies of unspecified lower limb, excluding foot: Secondary | ICD-10-CM

## 2013-07-10 DIAGNOSIS — M1712 Unilateral primary osteoarthritis, left knee: Secondary | ICD-10-CM

## 2013-07-10 DIAGNOSIS — M1710 Unilateral primary osteoarthritis, unspecified knee: Secondary | ICD-10-CM

## 2013-07-10 DIAGNOSIS — IMO0002 Reserved for concepts with insufficient information to code with codable children: Secondary | ICD-10-CM

## 2013-07-10 NOTE — Assessment & Plan Note (Signed)
Knee pain is now completely resolved one month after injection. Continue therapy.

## 2013-07-10 NOTE — Assessment & Plan Note (Signed)
Still has bilateral trochanteric bursitis. Unfortunately has not been working on this with physical therapy, tweaked the PT orders to include aggressive hip abductor rehabilitation, and topical modalities as needed for her trochanteric bursitis. Return to see me, after PT if no better weakness or consider bilateral trochanteric bursa injections.

## 2013-07-10 NOTE — Progress Notes (Signed)
  Subjective:    CC: Followup  HPI: Left knee osteoarthritis: Pain is completely resolved after injection and with physical therapy.  Bilateral trochanteric bursitis: PT has not been working on her hip abductors, as such pain is the same.  Past medical history, Surgical history, Family history not pertinant except as noted below, Social history, Allergies, and medications have been entered into the medical record, reviewed, and no changes needed.   Review of Systems: No fevers, chills, night sweats, weight loss, chest pain, or shortness of breath.   Objective:    General: Well Developed, well nourished, and in no acute distress.  Neuro: Alert and oriented x3, extra-ocular muscles intact, sensation grossly intact.  HEENT: Normocephalic, atraumatic, pupils equal round reactive to light, neck supple, no masses, no lymphadenopathy, thyroid nonpalpable.  Skin: Warm and dry, no rashes. Cardiac: Regular rate and rhythm, no murmurs rubs or gallops, no lower extremity edema.  Respiratory: Clear to auscultation bilaterally. Not using accessory muscles, speaking in full sentences. Impression and Recommendations:

## 2013-07-12 ENCOUNTER — Encounter: Payer: Managed Care, Other (non HMO) | Admitting: Physical Therapy

## 2013-07-16 ENCOUNTER — Encounter (INDEPENDENT_AMBULATORY_CARE_PROVIDER_SITE_OTHER): Payer: Managed Care, Other (non HMO) | Admitting: Physical Therapy

## 2013-07-16 DIAGNOSIS — M2550 Pain in unspecified joint: Secondary | ICD-10-CM

## 2013-07-16 DIAGNOSIS — M1710 Unilateral primary osteoarthritis, unspecified knee: Secondary | ICD-10-CM

## 2013-07-16 DIAGNOSIS — M2560 Stiffness of unspecified joint, not elsewhere classified: Secondary | ICD-10-CM

## 2013-07-16 DIAGNOSIS — M545 Low back pain: Secondary | ICD-10-CM

## 2013-07-16 DIAGNOSIS — M6281 Muscle weakness (generalized): Secondary | ICD-10-CM

## 2013-07-19 ENCOUNTER — Encounter: Payer: Managed Care, Other (non HMO) | Admitting: Physical Therapy

## 2013-07-20 ENCOUNTER — Encounter (INDEPENDENT_AMBULATORY_CARE_PROVIDER_SITE_OTHER): Payer: Managed Care, Other (non HMO) | Admitting: Physical Therapy

## 2013-07-20 DIAGNOSIS — M6281 Muscle weakness (generalized): Secondary | ICD-10-CM

## 2013-07-20 DIAGNOSIS — M545 Low back pain: Secondary | ICD-10-CM

## 2013-07-20 DIAGNOSIS — M1710 Unilateral primary osteoarthritis, unspecified knee: Secondary | ICD-10-CM

## 2013-07-20 DIAGNOSIS — M2560 Stiffness of unspecified joint, not elsewhere classified: Secondary | ICD-10-CM

## 2013-07-20 DIAGNOSIS — M2550 Pain in unspecified joint: Secondary | ICD-10-CM

## 2013-07-23 ENCOUNTER — Other Ambulatory Visit: Payer: Self-pay | Admitting: Family Medicine

## 2013-07-23 ENCOUNTER — Encounter: Payer: Managed Care, Other (non HMO) | Admitting: Physical Therapy

## 2013-07-23 DIAGNOSIS — M545 Low back pain, unspecified: Secondary | ICD-10-CM

## 2013-07-23 DIAGNOSIS — M6281 Muscle weakness (generalized): Secondary | ICD-10-CM

## 2013-07-23 DIAGNOSIS — M2560 Stiffness of unspecified joint, not elsewhere classified: Secondary | ICD-10-CM

## 2013-07-23 DIAGNOSIS — IMO0002 Reserved for concepts with insufficient information to code with codable children: Secondary | ICD-10-CM

## 2013-07-23 DIAGNOSIS — M2550 Pain in unspecified joint: Secondary | ICD-10-CM

## 2013-07-23 DIAGNOSIS — M1710 Unilateral primary osteoarthritis, unspecified knee: Secondary | ICD-10-CM

## 2013-07-26 ENCOUNTER — Encounter: Payer: Managed Care, Other (non HMO) | Admitting: Physical Therapy

## 2013-07-26 DIAGNOSIS — M2560 Stiffness of unspecified joint, not elsewhere classified: Secondary | ICD-10-CM

## 2013-07-26 DIAGNOSIS — M1710 Unilateral primary osteoarthritis, unspecified knee: Secondary | ICD-10-CM

## 2013-07-26 DIAGNOSIS — M6281 Muscle weakness (generalized): Secondary | ICD-10-CM

## 2013-07-26 DIAGNOSIS — M545 Low back pain: Secondary | ICD-10-CM

## 2013-07-26 DIAGNOSIS — M2550 Pain in unspecified joint: Secondary | ICD-10-CM

## 2013-07-27 ENCOUNTER — Encounter: Payer: Managed Care, Other (non HMO) | Admitting: Family Medicine

## 2013-07-30 ENCOUNTER — Encounter: Payer: Managed Care, Other (non HMO) | Admitting: Physical Therapy

## 2013-07-31 ENCOUNTER — Other Ambulatory Visit: Payer: Self-pay | Admitting: Family Medicine

## 2013-07-31 MED ORDER — ZOLPIDEM TARTRATE ER 12.5 MG PO TBCR: EXTENDED_RELEASE_TABLET | ORAL | Status: DC

## 2013-08-01 ENCOUNTER — Encounter: Payer: Managed Care, Other (non HMO) | Admitting: Physical Therapy

## 2013-08-03 ENCOUNTER — Ambulatory Visit (INDEPENDENT_AMBULATORY_CARE_PROVIDER_SITE_OTHER): Payer: Managed Care, Other (non HMO) | Admitting: Family Medicine

## 2013-08-03 ENCOUNTER — Encounter: Payer: Self-pay | Admitting: Family Medicine

## 2013-08-03 ENCOUNTER — Encounter (INDEPENDENT_AMBULATORY_CARE_PROVIDER_SITE_OTHER): Payer: Managed Care, Other (non HMO) | Admitting: Physical Therapy

## 2013-08-03 VITALS — BP 121/74 | HR 73 | Ht 66.6 in | Wt 200.0 lb

## 2013-08-03 DIAGNOSIS — M2550 Pain in unspecified joint: Secondary | ICD-10-CM

## 2013-08-03 DIAGNOSIS — Z0000 Encounter for general adult medical examination without abnormal findings: Secondary | ICD-10-CM

## 2013-08-03 DIAGNOSIS — Z1211 Encounter for screening for malignant neoplasm of colon: Secondary | ICD-10-CM

## 2013-08-03 DIAGNOSIS — M545 Low back pain: Secondary | ICD-10-CM

## 2013-08-03 DIAGNOSIS — L680 Hirsutism: Secondary | ICD-10-CM

## 2013-08-03 DIAGNOSIS — M1710 Unilateral primary osteoarthritis, unspecified knee: Secondary | ICD-10-CM

## 2013-08-03 DIAGNOSIS — M6281 Muscle weakness (generalized): Secondary | ICD-10-CM

## 2013-08-03 DIAGNOSIS — M2560 Stiffness of unspecified joint, not elsewhere classified: Secondary | ICD-10-CM

## 2013-08-03 DIAGNOSIS — L678 Other hair color and hair shaft abnormalities: Secondary | ICD-10-CM

## 2013-08-03 MED ORDER — ZOLPIDEM TARTRATE ER 12.5 MG PO TBCR: EXTENDED_RELEASE_TABLET | ORAL | Status: DC

## 2013-08-03 MED ORDER — SPIRONOLACTONE 100 MG PO TABS: 100.0000 mg | ORAL_TABLET | Freq: Every day | ORAL | Status: DC

## 2013-08-03 NOTE — Progress Notes (Signed)
  Subjective:     Rose Long is a 52 y.o. female and is here for a comprehensive physical exam. The patient reports no problems.  History   Social History  . Marital Status: Single    Spouse Name: N/A    Number of Children: N/A  . Years of Education: Masters   Occupational History  . 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     Social History Main Topics  . Smoking status: Never Smoker   . Smokeless tobacco: Never Used  . Alcohol Use: 1.0 oz/week    2 drink(s) per week     Comment: socially  . Drug Use: No  . Sexual Activity: Not Currently   Other Topics Concern  . Not on file   Social History Narrative   Parents moved into her home on June 2013.  2 caffeine drinks per day.  Works out 1 hour 5-6 days per week.    Health Maintenance  Topic Date Due  . Colonoscopy  06/19/2012  . Influenza Vaccine  05/18/2013  . Pap Smear  03/15/2014  . Mammogram  11/29/2014  . Tetanus/tdap  11/17/2022    The following portions of the patient's history were reviewed and updated as appropriate: allergies, current medications, past medical history, past social history, past surgical history and problem list.  Review of Systems A comprehensive review of systems was negative.   Objective:    BP 121/74  Pulse 73  Ht 5' 6.6" (1.692 m)  Wt 200 lb (90.719 kg)  BMI 31.69 kg/m2 General appearance: alert, cooperative and appears stated age Head: Normocephalic, without obvious abnormality, atraumatic Eyes: conj clear, EOMi, PEERLA Ears: normal TM's and external ear canals both ears Nose: Nares normal. Septum midline. Mucosa normal. No drainage or sinus tenderness. Throat: lips, mucosa, and tongue normal; teeth and gums normal Neck: no adenopathy, no carotid bruit, no JVD, supple, symmetrical, trachea midline and thyroid not enlarged, symmetric, no tenderness/mass/nodules Back: symmetric, no curvature. ROM normal. No CVA tenderness. Lungs: clear to auscultation  bilaterally Breasts: normal appearance, no masses or tenderness Heart: regular rate and rhythm, S1, S2 normal, no murmur, click, rub or gallop Abdomen: soft, non-tender; bowel sounds normal; no masses,  no organomegaly Extremities: extremities normal, atraumatic, no cyanosis or edema Pulses: 2+ and symmetric Skin: Skin color, texture, turgor normal. No rashes or lesions Lymph nodes: Cervical, supraclavicular, and axillary nodes normal. Neurologic: Alert and oriented X 3, normal strength and tone. Normal symmetric reflexes. Normal coordination and gait    Assessment:    Healthy female exam.     Plan:     See After Visit Summary for Counseling Recommendations  Keep up a regular exercise program and make sure you are eating a healthy diet Try to eat 4 servings of dairy a day, or if you are lactose intolerant take a calcium with vitamin D daily.  Your vaccines are up to date.   Abnormal facial hair-she's on 59 g his prolactin has been for about 2 and half months. She has not noticed a significant response. She was like to try higher dose her last couple months to see if she is a better response. Will increase her 100 mg. Will monitor carefully for signs of dehydration since it is a diuretic. We will also check her potassium today.  Due for colonoscopy. Will schedule.  She plans to get her flu shot at work next week.

## 2013-08-03 NOTE — Patient Instructions (Signed)
complete physical examination  

## 2013-08-06 ENCOUNTER — Encounter: Payer: Managed Care, Other (non HMO) | Admitting: Physical Therapy

## 2013-08-06 DIAGNOSIS — M2560 Stiffness of unspecified joint, not elsewhere classified: Secondary | ICD-10-CM

## 2013-08-06 DIAGNOSIS — M6281 Muscle weakness (generalized): Secondary | ICD-10-CM

## 2013-08-06 DIAGNOSIS — M545 Low back pain: Secondary | ICD-10-CM

## 2013-08-06 DIAGNOSIS — M1710 Unilateral primary osteoarthritis, unspecified knee: Secondary | ICD-10-CM

## 2013-08-06 DIAGNOSIS — M2550 Pain in unspecified joint: Secondary | ICD-10-CM

## 2013-08-06 LAB — COMPLETE METABOLIC PANEL WITH GFR
AST: 15 U/L (ref 0–37)
Alkaline Phosphatase: 55 U/L (ref 39–117)
BUN: 9 mg/dL (ref 6–23)
Calcium: 9.8 mg/dL (ref 8.4–10.5)
Chloride: 105 mEq/L (ref 96–112)
Creat: 0.85 mg/dL (ref 0.50–1.10)
GFR, Est African American: >89 mL/min
GFR, Est Non African American: 80 mL/min
Glucose, Bld: 76 mg/dL (ref 70–99)

## 2013-08-06 LAB — LIPID PANEL
Cholesterol: 177 mg/dL (ref 0–200)
HDL: 56 mg/dL (ref >39)
Triglycerides: 71 mg/dL (ref <150)

## 2013-08-06 LAB — CBC
Hemoglobin: 13.9 g/dL (ref 12.0–15.0)
MCV: 83.3 fL (ref 78.0–100.0)
Platelets: 303 10*3/uL (ref 150–400)
RBC: 4.85 MIL/uL (ref 3.87–5.11)
WBC: 6.1 10*3/uL (ref 4.0–10.5)

## 2013-08-06 LAB — FERRITIN: Ferritin: 10 ng/mL (ref 10–291)

## 2013-08-07 ENCOUNTER — Other Ambulatory Visit: Payer: Self-pay | Admitting: *Deleted

## 2013-08-07 DIAGNOSIS — R790 Abnormal level of blood mineral: Secondary | ICD-10-CM

## 2013-09-18 ENCOUNTER — Other Ambulatory Visit: Payer: Self-pay | Admitting: Nurse Practitioner

## 2013-09-18 DIAGNOSIS — G43909 Migraine, unspecified, not intractable, without status migrainosus: Secondary | ICD-10-CM

## 2013-09-21 NOTE — Telephone Encounter (Signed)
Rcvd fax from pharm for refill of Sumatriptan - will refill only once and adv pt to make follow up appt with Jannifer Rodney before any further refills can be authorized.  Sent e-script with refills of 11 but called pharm and spoke to [facility] and she changed refills to only 1 and will adv pt to make appt before any more refills can be complete.

## 2013-10-10 ENCOUNTER — Encounter: Payer: Self-pay | Admitting: Family Medicine

## 2013-10-16 ENCOUNTER — Ambulatory Visit (INDEPENDENT_AMBULATORY_CARE_PROVIDER_SITE_OTHER): Payer: Managed Care, Other (non HMO) | Admitting: Family Medicine

## 2013-10-16 ENCOUNTER — Encounter: Payer: Self-pay | Admitting: *Deleted

## 2013-10-16 VITALS — BP 105/64 | HR 66 | Wt 198.0 lb

## 2013-10-16 DIAGNOSIS — Z01818 Encounter for other preprocedural examination: Secondary | ICD-10-CM

## 2013-10-16 NOTE — Progress Notes (Signed)
Patient was in office to get EKG per Dr. Linford Arnold for pre op. Allaina Brotzman,CMA

## 2013-10-17 NOTE — Progress Notes (Signed)
   Subjective:    Patient ID: Rose Long, female    DOB: [birth year], 52 y.o.   MRN: [id number]  HPI    Review of Systems     Objective:   Physical Exam        Assessment & Plan:  EKG shows rate of 52 beats per minute, sinus rhythm, bradycardia, no acute ST-T wave changes. No Q waves.  Copy faxed to plastic surgeons office.

## 2013-10-18 HISTORY — PX: BREAST SURGERY: SHX581

## 2013-10-23 ENCOUNTER — Other Ambulatory Visit: Payer: Self-pay | Admitting: Nurse Practitioner

## 2013-10-24 ENCOUNTER — Encounter: Payer: Self-pay | Admitting: *Deleted

## 2013-10-25 ENCOUNTER — Other Ambulatory Visit: Payer: Self-pay | Admitting: *Deleted

## 2013-10-25 DIAGNOSIS — G43909 Migraine, unspecified, not intractable, without status migrainosus: Secondary | ICD-10-CM

## 2013-11-07 ENCOUNTER — Ambulatory Visit (INDEPENDENT_AMBULATORY_CARE_PROVIDER_SITE_OTHER): Payer: Managed Care, Other (non HMO)

## 2013-11-07 ENCOUNTER — Ambulatory Visit (INDEPENDENT_AMBULATORY_CARE_PROVIDER_SITE_OTHER): Payer: Managed Care, Other (non HMO) | Admitting: Family Medicine

## 2013-11-07 ENCOUNTER — Encounter: Payer: Self-pay | Admitting: Family Medicine

## 2013-11-07 VITALS — BP 115/56 | HR 55 | Temp 98.1°F | Ht 66.6 in | Wt 196.0 lb

## 2013-11-07 DIAGNOSIS — G5600 Carpal tunnel syndrome, unspecified upper limb: Secondary | ICD-10-CM

## 2013-11-07 DIAGNOSIS — Z8739 Personal history of other diseases of the musculoskeletal system and connective tissue: Secondary | ICD-10-CM

## 2013-11-07 DIAGNOSIS — M545 Low back pain, unspecified: Secondary | ICD-10-CM

## 2013-11-07 DIAGNOSIS — M2550 Pain in unspecified joint: Secondary | ICD-10-CM

## 2013-11-07 DIAGNOSIS — R5381 Other malaise: Secondary | ICD-10-CM

## 2013-11-07 DIAGNOSIS — R5383 Other fatigue: Secondary | ICD-10-CM

## 2013-11-07 DIAGNOSIS — M47817 Spondylosis without myelopathy or radiculopathy, lumbosacral region: Secondary | ICD-10-CM

## 2013-11-07 DIAGNOSIS — M79609 Pain in unspecified limb: Secondary | ICD-10-CM

## 2013-11-07 DIAGNOSIS — M5137 Other intervertebral disc degeneration, lumbosacral region: Secondary | ICD-10-CM

## 2013-11-07 LAB — CBC
HCT: 39.7 % (ref 36.0–46.0)
Hemoglobin: 13.2 g/dL (ref 12.0–15.0)
MCH: 27.9 pg (ref 26.0–34.0)
MCHC: 33.2 g/dL (ref 30.0–36.0)
MCV: 83.9 fL (ref 78.0–100.0)
Platelets: 358 10*3/uL (ref 150–400)
RBC: 4.73 MIL/uL (ref 3.87–5.11)
RDW: 13.9 % (ref 11.5–15.5)
WBC: 6.5 10*3/uL (ref 4.0–10.5)

## 2013-11-07 LAB — RHEUMATOID FACTOR: Rhuematoid fact SerPl-aCnc: 12 IU/mL (ref <=14)

## 2013-11-07 LAB — FERRITIN: Ferritin: 5 ng/mL — ABNORMAL LOW (ref 10–291)

## 2013-11-07 LAB — C-REACTIVE PROTEIN: CRP: <0.5 mg/dL (ref <0.60)

## 2013-11-07 LAB — URIC ACID: URIC ACID, SERUM: 6 mg/dL (ref 2.4–7.0)

## 2013-11-07 NOTE — Progress Notes (Signed)
   Subjective:    Patient ID: Rose Long, female    DOB: 1961-03-22, 53 y.o.   MRN: [id number]  HPI hx of arthritis pt reports that the pain has gotten worse over the past few weeks it has gotten worse over the weekend and she has also experiencing fatigue. Pain mostly in knees, low back and hands.  Feels fatigued all the time.  Sees her chiropractor once a week for maintenance.  Has constant mid back pain and occ radiates sometimes when she takes a deep breath. Gets numbness and tingling in her hands and fingers.  She has also been experiencing a burning sensation on the top of her left foot that started a few weeks ago. It comes and goes. No known triggers.  Gets a pressure feeling in her arms.  No sig sig choulder pain.  Feet painful as well. Getting burning sensation in her left foot. Comes and goes.  No sig joint swelling.  Hx of childhood rheumatoid.  Father has gout.  Mother has OA.      Review of Systems     Objective:   Physical Exam  Musculoskeletal:  No significant deformity or swelling over the joints in either hand. Radial pulses 2+ bilaterally. Mildly tender over the wrists. Neck with normal flexion extension rotation right and left and side bending. Positive Tinel's and Phalen's sign in the right and left hands. Left foot is nontender. Good capillary refill. Dorsal pedal pulse 2+. Ankle strength is 5 out of 5.   Skin:  No erythema or rashes over the joints.          Assessment & Plan:  Polyarthralgia-with a prior history of juvenile rheumatoid arthritis I think it's important to evaluate for autoimmune disorder. She says she was tested and was 20 years ago and was seronegative. She does not have significant inflammation of the joints which makes me think this could be more osteoarthritis. I would like to get plain film x-rays of the hand in the lumbar spine today. We will also do additional blood work.  Carpal tunnel syndrome, bilateral-treat with nighttime splints.  Handout provided. If not improving over 3-4 weeks and please let me know. Can use an anti-inflammatory as needed for pain or discomfort. Consider that this could be coming from her neck as she has had prior history of neck problems but right now she's not having any significant neck discomfort.  Left foot paresthesia-she's been experiencing a burning sensation on the top of her left foot. Could be related to nerve compression at the lumbar spine (?L5) versus an actual peripheral neuropathy. It is asymmetric. She takes B12 a regular basis and has had normal B12 levels in the past. She has had low iron stores in the past we could certainly recheck that and make sure that it's normal. No history of diabetes.

## 2013-11-07 NOTE — Patient Instructions (Signed)
Carpal Tunnel Syndrome The carpal tunnel is an area under the skin of the palm of your hand. Nerves, blood vessels, and strong tissues (tendons) pass through the tunnel. The tunnel can become puffy (swollen). If this happens, a nerve can be pinched in the wrist. This causes carpal tunnel syndrome.  HOME CARE  Take all medicine as told by your doctor.  If you were given a splint, wear it as told. Wear it at night or at times when your doctor told you to.  Rest your wrist from the activity that causes your pain.  Put ice on your wrist after long periods of wrist activity.  Put ice in a plastic bag.  Place a towel between your skin and the bag.  Leave the ice on for 15-20 minutes, 03-04 times a day.  Keep all doctor visits as told. GET HELP RIGHT AWAY IF:  You have new problems you cannot explain.  Your problems get worse and medicine does not help. MAKE SURE YOU:   Understand these instructions.  Will watch your condition.  Will get help right away if you are not doing well or get worse. Document Released: 09/23/2011 Document Revised: 12/27/2011 Document Reviewed: 09/23/2011 [facility] Patient Information 2014 [place], [place]. Carpal Tunnel Syndrome Carpal tunnel syndrome is a disorder of the nervous system in the wrist that causes pain, hand weakness, and/or loss of feeling. Carpal tunnel syndrome is caused by the compression, stretching, or irritation of the median nerve at the wrist joint. Athletes who experience carpal tunnel syndrome may notice a decrease in their performance to the condition, especially for sports that require strong hand or wrist action.  SYMPTOMS   Tingling, numbness, or burning pain in the hand or fingers.  Inability to sleep due to pain in the hand.  Sharp pains that shoot from the wrist up the arm or to the fingers, especially at night.  Morning stiffness or cramping of the hand.  Thumb weakness, resulting in difficulty holding objects or making a  fist.  Shiny, dry skin on the hand.  Reduced performance in any sport requiring a strong grip. CAUSES   Median nerve damage at the wrist is caused by pressure due to swelling, inflammation, or scarred tissue.  Sources of pressure include:  Repetitive gripping or squeezing that causes inflammation of the tendon sheaths.  Scarring or shortening of the ligament that covers the median nerve.  Traumatic injury to the wrist or forearm such as fracture, sprain, or dislocation.  Prolonged hyperextension (wrist bent backward) or hyperflexion (wrist bent downward) of the wrist. RISK INCREASES WITH:  Diabetes mellitus.  Menopause or amenorrhea.  Rheumatoid arthritis.  Raynaud's disease.  Pregnancy.  Gout.  Kidney disease.  Ganglion cyst.  Repetitive hand or wrist action.  Hypothyroidism (underactive thyroid gland).  Repetitive jolting or shaking of the hands or wrist.  Prolonged forceful weight-bearing on the hands. PREVENTION  Bracing the hand and wrist straight during activities that involve repetitive grasping.  For activities that require prolonged extension of the wrist (bending towards the top of the forearm) periodically change the position of your wrists.  Learn and use proper technique in activities that result in the wrist position in neutral to slight extension.  Avoid bending the wrist into full extension or flexion (up or down)  Keep the wrist in a straight (neutral) position. To keep the wrist in this position, wear a splint.  Avoid repetitive hand and wrist motions.  When possible avoid prolonged grasping of items (steering wheel of  a car, a pen, a vacuum cleaner, or a rake).  Loosen your grip for activities that require prolonged grasping of items.  Place keyboards and writing surfaces at the correct height as to decrease strain on the wrist and hand.  Alternate work tasks to avoid prolonged wrist flexion.  Avoid pinching activities (needlework  and writing) as they may irritate your carpal tunnel syndrome.  If these activities are necessary, complete them for shorter periods of time.  When writing, use a felt tip or roller ball pen and/or build up the grip on a pen to decrease the forces required for writing. PROGNOSIS  Carpal tunnel syndrome is usually curable with appropriate conservative treatment and sometimes resolves spontaneously. For some cases, surgery is necessary, especially if muscle wasting or nerve changes have developed.  RELATED COMPLICATIONS   Permanent numbness and a weak thumb or fingers in the affected hand.  Permanent paralysis of a portion of the hand and fingers. TREATMENT  Treatment initially consists of stopping activities that aggravate the symptoms as well as medication and ice to reduce inflammation. A wrist splint is often recommended for wear during activities of repetitive motion as well as at night. It is also important to learn and use proper technique when performing activities that typically cause pain. On occasion, a corticosteroid injection may be given. If symptoms persist despite conservative treatment, surgery may be an option. Surgical techniques free the pinched or compressed nerve. Carpal tunnel surgery is usually performed on an outpatient basis, meaning you go home the same day as surgery. These procedures provide almost complete relief of all symptoms in 95% of patients. Expect at least 2 weeks for healing after surgery. For cases that are the result of repeated jolting or shaking of the hand or wrist or prolonged hyperextension, surgery is not usually recommended, because stretching of the median nerve and not compression are usually the cause of carpal tunnel syndrome in these cases. MEDICATION   If pain medication is necessary, nonsteroidal anti-inflammatory medications, such as aspirin and ibuprofen, or other minor pain relievers, such as acetaminophen, are often recommended.  Do not take  pain medication for 7 days before surgery.  Prescription pain relievers are usually only prescribed after surgery. Use only as directed and only as much as you need.  Corticosteroid injections may be given to reduce inflammation. However, they are not always recommended.  Vitamin B6 (pyridoxine) may reduce symptoms; use only if prescribed for your disorder. SEEK MEDICAL CARE IF:   Symptoms get worse or do not improve in 2 weeks despite treatment.  You also have a current or recent history of neck or shoulder injury that has resulted in pain or tingling elsewhere in your arm. Document Released: 10/04/2005 Document Revised: 01/29/2013 Document Reviewed: 01/16/2009 [facility] Patient Information 2014 [place], [place].

## 2013-11-08 ENCOUNTER — Encounter: Payer: Self-pay | Admitting: Family Medicine

## 2013-11-08 LAB — CYCLIC CITRUL PEPTIDE ANTIBODY, IGG: Cyclic Citrullin Peptide Ab: <2 U/mL (ref 0.0–5.0)

## 2013-11-08 LAB — ANA: Anti Nuclear Antibody(ANA): NEGATIVE

## 2013-11-10 LAB — VITAMIN B1: VITAMIN B1 (THIAMINE): 13 nmol/L (ref 8–30)

## 2013-11-20 ENCOUNTER — Telehealth: Payer: Self-pay | Admitting: *Deleted

## 2013-11-20 ENCOUNTER — Other Ambulatory Visit: Payer: Self-pay | Admitting: *Deleted

## 2013-11-20 DIAGNOSIS — G43909 Migraine, unspecified, not intractable, without status migrainosus: Secondary | ICD-10-CM

## 2013-11-20 DIAGNOSIS — M62838 Other muscle spasm: Secondary | ICD-10-CM

## 2013-11-20 MED ORDER — SPIRONOLACTONE 100 MG PO TABS: 100.0000 mg | ORAL_TABLET | Freq: Every day | ORAL | Status: DC

## 2013-11-20 MED ORDER — TIZANIDINE HCL 4 MG PO TABS: 4.0000 mg | ORAL_TABLET | Freq: Every day | ORAL | Status: DC

## 2013-11-20 MED ORDER — SUMATRIPTAN SUCCINATE 100 MG PO TABS: ORAL_TABLET | ORAL | Status: DC

## 2013-11-20 NOTE — Telephone Encounter (Signed)
Pt called requesting RF on Imitrex and Tizanidine.  Per Monna Fam, NP may Rf x one but pt must make appt before any further RF's.  LM on pt's voicemail with those instructions.

## 2013-11-20 NOTE — Telephone Encounter (Signed)
Pt would like to [facility] [place] and she needed a refill to be sent.Rose Long [employer]

## 2013-12-18 ENCOUNTER — Other Ambulatory Visit: Payer: Self-pay | Admitting: *Deleted

## 2013-12-18 DIAGNOSIS — G43909 Migraine, unspecified, not intractable, without status migrainosus: Secondary | ICD-10-CM

## 2013-12-18 MED ORDER — SUMATRIPTAN SUCCINATE 100 MG PO TABS: ORAL_TABLET | ORAL | Status: DC

## 2013-12-18 NOTE — Telephone Encounter (Signed)
Pt called and requested a Rf on Imitrex.  She does have a scheduled appt with Monna Fam, NP 01/22/14.  RF granted and sent to [facility] per [place], NP

## 2014-01-14 ENCOUNTER — Other Ambulatory Visit: Payer: Self-pay | Admitting: Family Medicine

## 2014-01-14 ENCOUNTER — Telehealth: Payer: Self-pay | Admitting: *Deleted

## 2014-01-14 NOTE — Telephone Encounter (Signed)
lvm for return call.Rose Long Rose Long  

## 2014-01-15 ENCOUNTER — Other Ambulatory Visit: Payer: Self-pay | Admitting: Family Medicine

## 2014-01-15 NOTE — Telephone Encounter (Signed)
Questions about taking something for irregularity. She is currently taking dulcolax, and miralax increased her water intake and is still struggling with BM. I told her to increase her fiber in her diet and I will speak with Dr. Madilyn Fireman about the Iron supplement also.Marland Kitchen Marland KitchenTeddy Long

## 2014-01-22 ENCOUNTER — Ambulatory Visit (INDEPENDENT_AMBULATORY_CARE_PROVIDER_SITE_OTHER): Payer: Managed Care, Other (non HMO) | Admitting: Nurse Practitioner

## 2014-01-22 ENCOUNTER — Encounter: Payer: Self-pay | Admitting: Nurse Practitioner

## 2014-01-22 VITALS — BP 108/77 | HR 69 | Resp 16 | Ht 67.0 in | Wt 192.0 lb

## 2014-01-22 DIAGNOSIS — G4700 Insomnia, unspecified: Secondary | ICD-10-CM

## 2014-01-22 DIAGNOSIS — G43909 Migraine, unspecified, not intractable, without status migrainosus: Secondary | ICD-10-CM

## 2014-01-22 DIAGNOSIS — G43009 Migraine without aura, not intractable, without status migrainosus: Secondary | ICD-10-CM

## 2014-01-22 DIAGNOSIS — M62838 Other muscle spasm: Secondary | ICD-10-CM

## 2014-01-22 DIAGNOSIS — L680 Hirsutism: Secondary | ICD-10-CM

## 2014-01-22 DIAGNOSIS — L678 Other hair color and hair shaft abnormalities: Secondary | ICD-10-CM

## 2014-01-22 MED ORDER — TIZANIDINE HCL 6 MG PO CAPS: 6.0000 mg | ORAL_CAPSULE | Freq: Three times a day (TID) | ORAL | Status: DC

## 2014-01-22 MED ORDER — ESZOPICLONE 3 MG PO TABS: 3.0000 mg | ORAL_TABLET | Freq: Every day | ORAL | Status: DC

## 2014-01-22 NOTE — Progress Notes (Signed)
History:  Rose Long is a 53 y.o. G0P0000 who presents to [facility] clinic today for follow up on migraines. She was last seen 2 years ago. Since our last visit she has gotten off Effexor and Flexeril. She is taking Ambien CR at bedtime and worries about it causing early morning headaches. She is willing to switch over to [place]. She mostly has daily tension and mild migraine headache with neck and shoulder spasm. She takes 1/2 Imitrex and it gives her relief. She has been seeing the Chiropractor until her surgery. 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 She has changed job and feels less stressed.  The following portions of the patient's history were reviewed and updated as appropriate: allergies, current medications, past family history, past medical history, past social history, past surgical history and problem list.  Review of Systems:  Pertinent items are noted in HPI.  Objective:  Physical Exam BP 108/77  Pulse 69  Resp 16  Ht 5\' 7"  (1.702 m)  Wt 192 lb (87.091 kg)  BMI 30.06 kg/m2 GENERAL: Well-developed, well-nourished female in no acute distress.  HEENT: Normocephalic, atraumatic.  NECK: Supple. Normal thyroid.  EXTREMITIES: No cyanosis, clubbing, or edema, 2+ distal pulses.   Labs and Imaging   Assessment & Plan:  Assessment:  Migraine Headache Chronic tension headaches Insomnia Muscle spasm  Plans:  We discussed Botox for muscle spasm and migraine/ we will seek approval She will switch over to Lunesta 3 mg q hs She will add Zanaflex as needed for muscle spasm Total time 30 minutes   Olegario Messier, NP 01/22/2014 4:04 PM

## 2014-01-22 NOTE — Patient Instructions (Signed)
Migraine Headache A migraine headache is an intense, throbbing pain on one or both sides of your head. A migraine can last for 30 minutes to several hours. CAUSES  The exact cause of a migraine headache is not always known. However, a migraine may be caused when nerves in the brain become irritated and release chemicals that cause inflammation. This causes pain. Certain things may also trigger migraines, such as:  Alcohol.  Smoking.  Stress.  Menstruation.  Aged cheeses.  Foods or drinks that contain nitrates, glutamate, aspartame, or tyramine.  Lack of sleep.  Chocolate.  Caffeine.  Hunger.  Physical exertion.  Fatigue.  Medicines used to treat chest pain (nitroglycerine), birth control pills, estrogen, and some blood pressure medicines. SIGNS AND SYMPTOMS  Pain on one or both sides of your head.  Pulsating or throbbing pain.  Severe pain that prevents daily activities.  Pain that is aggravated by any physical activity.  Nausea, vomiting, or both.  Dizziness.  Pain with exposure to bright lights, loud noises, or activity.  General sensitivity to bright lights, loud noises, or smells. Before you get a migraine, you may get warning signs that a migraine is coming (aura). An aura may include:  Seeing flashing lights.  Seeing bright spots, halos, or zig-zag lines.  Having tunnel vision or blurred vision.  Having feelings of numbness or tingling.  Having trouble talking.  Having muscle weakness. DIAGNOSIS  A migraine headache is often diagnosed based on:  Symptoms.  Physical exam.  A CT scan or MRI of your head. These imaging tests cannot diagnose migraines, but they can help rule out other causes of headaches. TREATMENT Medicines may be given for pain and nausea. Medicines can also be given to help prevent recurrent migraines.  HOME CARE INSTRUCTIONS  Only take over-the-counter or prescription medicines for pain or discomfort as directed by your  health care provider. The use of long-term narcotics is not recommended.  Lie down in a dark, quiet room when you have a migraine.  Keep a journal to find out what may trigger your migraine headaches. For example, write down:  What you eat and drink.  How much sleep you get.  Any change to your diet or medicines.  Limit alcohol consumption.  Quit smoking if you smoke.  Get 7 9 hours of sleep, or as recommended by your health care provider.  Limit stress.  Keep lights dim if bright lights bother you and make your migraines worse. SEEK IMMEDIATE MEDICAL CARE IF:   Your migraine becomes severe.  You have a fever.  You have a stiff neck.  You have vision loss.  You have muscular weakness or loss of muscle control.  You start losing your balance or have trouble walking.  You feel faint or pass out.  You have severe symptoms that are different from your first symptoms. MAKE SURE YOU:   Understand these instructions.  Will watch your condition.  Will get help right away if you are not doing well or get worse. Document Released: 10/04/2005 Document Revised: 07/25/2013 Document Reviewed: 06/11/2013 ExitCare Patient Information 2014 [employer].  

## 2014-01-23 ENCOUNTER — Encounter: Payer: Self-pay | Admitting: *Deleted

## 2014-01-23 ENCOUNTER — Telehealth: Payer: Self-pay | Admitting: *Deleted

## 2014-01-23 NOTE — Telephone Encounter (Signed)
Initial prior authorization started with Botox Reimbursement.  Forms filled out and waiting authorization notification.

## 2014-01-28 ENCOUNTER — Other Ambulatory Visit: Payer: Self-pay | Admitting: Nurse Practitioner

## 2014-01-30 ENCOUNTER — Other Ambulatory Visit: Payer: Self-pay | Admitting: *Deleted

## 2014-01-30 DIAGNOSIS — G43909 Migraine, unspecified, not intractable, without status migrainosus: Secondary | ICD-10-CM

## 2014-01-30 MED ORDER — SUMATRIPTAN SUCCINATE 100 MG PO TABS: ORAL_TABLET | ORAL | Status: DC

## 2014-01-30 NOTE — Telephone Encounter (Signed)
RF request for Imitrex sent to [place] on [facility]

## 2014-02-15 ENCOUNTER — Other Ambulatory Visit: Payer: Self-pay | Admitting: *Deleted

## 2014-02-15 MED ORDER — ZOLPIDEM TARTRATE ER 12.5 MG PO TBCR: EXTENDED_RELEASE_TABLET | ORAL | Status: DC

## 2014-03-16 ENCOUNTER — Other Ambulatory Visit: Payer: Self-pay | Admitting: Family Medicine

## 2014-04-16 ENCOUNTER — Other Ambulatory Visit: Payer: Self-pay | Admitting: Family Medicine

## 2014-05-10 ENCOUNTER — Telehealth: Payer: Self-pay | Admitting: *Deleted

## 2014-05-10 ENCOUNTER — Encounter: Payer: Self-pay | Admitting: *Deleted

## 2014-05-10 NOTE — Telephone Encounter (Signed)
Pt called and wanted to know if Dr.Metheny would write letter stating that she requires the following OTC medications so that her flexible spending account will pay for them.Rose Long, [name]   Powder or liquid protein Calcium citrate Vitamin D B-12 Multivitamin Iron supplement

## 2014-05-10 NOTE — Telephone Encounter (Signed)
[email or web address] .Audelia Hives [place]

## 2014-05-10 NOTE — Telephone Encounter (Signed)
Yes, ok for these.

## 2014-05-15 ENCOUNTER — Other Ambulatory Visit: Payer: Self-pay | Admitting: Physician Assistant

## 2014-05-16 ENCOUNTER — Other Ambulatory Visit (INDEPENDENT_AMBULATORY_CARE_PROVIDER_SITE_OTHER): Payer: 59

## 2014-05-16 VITALS — Ht 67.0 in

## 2014-05-16 DIAGNOSIS — R319 Hematuria, unspecified: Secondary | ICD-10-CM

## 2014-05-16 LAB — POCT URINALYSIS DIPSTICK
Bilirubin, UA: NEGATIVE
Glucose, UA: NEGATIVE
KETONES UA: NEGATIVE
LEUKOCYTES UA: NEGATIVE
Nitrite, UA: NEGATIVE
PH UA: 7
Protein, UA: NEGATIVE
Spec Grav, UA: >=1.03
UROBILINOGEN UA: NEGATIVE

## 2014-05-16 NOTE — Progress Notes (Signed)
Pt called stating that she had endometrial ablation 2012.  She has not had any bleeding since but is noticing some light bleeding when she wipes.  She is unable to tell if it is from urine or vaginal.  She denies any cramping or pain.  She is here for urine and C&S. Pt will make appt with Dr Gala Romney

## 2014-05-18 LAB — CULTURE, URINE COMPREHENSIVE
Colony Count: NO GROWTH
ORGANISM ID, BACTERIA: NO GROWTH

## 2014-05-23 ENCOUNTER — Telehealth: Payer: Self-pay | Admitting: *Deleted

## 2014-05-23 NOTE — Telephone Encounter (Signed)
PA approved for Ambien CR through [facility].  Approved until 05/24/15. Pharmacy notified.

## 2014-06-04 ENCOUNTER — Telehealth: Payer: Self-pay | Admitting: *Deleted

## 2014-06-11 ENCOUNTER — Encounter: Payer: Self-pay | Admitting: Obstetrics & Gynecology

## 2014-06-11 ENCOUNTER — Ambulatory Visit (INDEPENDENT_AMBULATORY_CARE_PROVIDER_SITE_OTHER): Payer: 59 | Admitting: Obstetrics & Gynecology

## 2014-06-11 VITALS — BP 142/79 | HR 80 | Resp 16 | Ht 66.5 in | Wt 174.0 lb

## 2014-06-11 DIAGNOSIS — Z1151 Encounter for screening for human papillomavirus (HPV): Secondary | ICD-10-CM

## 2014-06-11 DIAGNOSIS — Z01419 Encounter for gynecological examination (general) (routine) without abnormal findings: Secondary | ICD-10-CM

## 2014-06-11 DIAGNOSIS — Z124 Encounter for screening for malignant neoplasm of cervix: Secondary | ICD-10-CM

## 2014-06-12 ENCOUNTER — Telehealth: Payer: Self-pay | Admitting: *Deleted

## 2014-06-12 DIAGNOSIS — K649 Unspecified hemorrhoids: Secondary | ICD-10-CM | POA: Insufficient documentation

## 2014-06-12 DIAGNOSIS — D649 Anemia, unspecified: Secondary | ICD-10-CM | POA: Insufficient documentation

## 2014-06-12 LAB — FOLLICLE STIMULATING HORMONE: FSH: 60.3 m[IU]/mL

## 2014-06-12 MED ORDER — HYDROCORTISONE ACE-PRAMOXINE 1-1 % RE FOAM: 1.0000 | Freq: Two times a day (BID) | RECTAL | Status: DC

## 2014-06-12 NOTE — Progress Notes (Signed)
  Subjective:     Rose Long is a 53 y.o. female here for a routine exam.  Current complaints: Pt had ablation 3 years ago and removal of endometrial and cervical polyp.  Pt had amenorrhea until 3 days in July 2015 and had some spotting.  Unsure if pt is in menopause.  Pt also c/o hemorrhoid pain for past week.  Slightly better now.  Personal health questionnaire reviewed: yes.   Gynecologic History No LMP recorded. Patient has had an ablation. Contraception: abstinence Last Pap: 2012. Results were: normal Last mammogram: 2013. Results were: normal per patient--done at [facility]  Obstetric History OB History  Gravida Para Term Preterm AB SAB TAB Ectopic Multiple Living  0 0 0 0 0 0 0 0 0 0          The following portions of the patient's history were reviewed and updated as appropriate: allergies, current medications, past family history, past medical history, past social history, past surgical history and problem list.  Review of Systems Pertinent items are noted in HPI.    Objective:      Filed Vitals:   06/11/14 1435  BP: 142/79  Pulse: 80  Resp: 16  Height: 5' 6.5" (1.689 m)  Weight: 174 lb (78.926 kg)   Vitals:  WNL General appearance: alert, cooperative and no distress Head: Normocephalic, without obvious abnormality, atraumatic Eyes: negative Throat: lips, mucosa, and tongue normal; teeth and gums normal Lungs: clear to auscultation bilaterally Breasts: normal appearance, no masses or tenderness, No nipple retraction or dimpling, No nipple discharge or bleeding Heart: regular rate and rhythm Abdomen: soft, non-tender; bowel sounds normal; no masses,  no organomegaly.  Recent scars from abdominoplasty.  Well Healed  Pelvic:  External Genitalia:  Tanner V, no lesion Urethra:  No prolapse Vagina:  Pale pink, normal rugae, no blood or discharge Cervix:  No CMT, no lesion; cervix friable with pap smear Uterus:  Normal size and contour, non tender Adnexa:   Normal, no masses, non tender Rectovaginal Septum:  Non tender, no masses;  2 moderate size hemorrhoids with questionable small clot in anterior hemorrhoid.   Extremities: no edema, redness or tenderness in the calves or thighs Skin: no lesions or rash Lymph nodes: Axillary adenopathy: none        Assessment:    Healthy female exam.  Spotting x1 s/p ablation Hemorroids   Plan:    Education reviewed: self breast exams. Contraception: abstinence. Mammogram ordered. Follow up in: 1 year. Pap with cotesting.  Next pap is due in 5 years [place] to see if patient is in menopause TVUS---Patient has a history of 3 1/2 cm ovarian cyst that she did not follow up 2 years ago.   Topical medicine for hemorrhoids.  If not better will refer to general surgeon.  Should not use longer than 1 week for risk of mucosal thinning. F/U with PCP for anemia and other medical problems.

## 2014-06-12 NOTE — Telephone Encounter (Signed)
Pt notified of [place] results and aware that her RX should be at her pharmacy for hemorrhoids

## 2014-06-13 LAB — CYTOLOGY - PAP

## 2014-06-19 ENCOUNTER — Other Ambulatory Visit: Payer: Self-pay | Admitting: Family Medicine

## 2014-06-21 LAB — HM MAMMOGRAPHY

## 2014-07-02 ENCOUNTER — Encounter: Payer: Self-pay | Admitting: Obstetrics & Gynecology

## 2014-07-09 ENCOUNTER — Ambulatory Visit (INDEPENDENT_AMBULATORY_CARE_PROVIDER_SITE_OTHER): Payer: 59 | Admitting: Sports Medicine

## 2014-07-09 ENCOUNTER — Encounter: Payer: Self-pay | Admitting: Sports Medicine

## 2014-07-09 VITALS — BP 118/68 | HR 78 | Ht 66.0 in | Wt 182.0 lb

## 2014-07-09 DIAGNOSIS — M1712 Unilateral primary osteoarthritis, left knee: Secondary | ICD-10-CM

## 2014-07-09 DIAGNOSIS — M1710 Unilateral primary osteoarthritis, unspecified knee: Secondary | ICD-10-CM

## 2014-07-09 NOTE — Assessment & Plan Note (Signed)
Newly one year response to the last injection. Repeat injection. Return as needed. Information given on Visco supplementation.

## 2014-07-09 NOTE — Progress Notes (Signed)
  Subjective:    CC: Followup  HPI: Left knee osteoarthritis: One-year response to the previous injection, desires repeat, pain is moderate, persistent, localized at the joint lines.  Past medical history, Surgical history, Family history not pertinant except as noted below, Social history, Allergies, and medications have been entered into the medical record, reviewed, and no changes needed.   Review of Systems: No fevers, chills, night sweats, weight loss, chest pain, or shortness of breath.   Objective:    General: Well Developed, well nourished, and in no acute distress.  Neuro: Alert and oriented x3, extra-ocular muscles intact, sensation grossly intact.  HEENT: Normocephalic, atraumatic, pupils equal round reactive to light, neck supple, no masses, no lymphadenopathy, thyroid nonpalpable.  Skin: Warm and dry, no rashes. Cardiac: Regular rate and rhythm, no murmurs rubs or gallops, no lower extremity edema.  Respiratory: Clear to auscultation bilaterally. Not using accessory muscles, speaking in full sentences.  Procedure: Real-time Ultrasound Guided Injection of left knee Device: GE Logiq E  Verbal informed consent obtained.  Time-out conducted.  Noted no overlying erythema, induration, or other signs of local infection.  Skin prepped in a sterile fashion.  Local anesthesia: Topical Ethyl chloride.  With sterile technique and under real time ultrasound guidance:  2 cc Kenalog 40, 4 cc lidocaine injected easily. Completed without difficulty  Pain immediately resolved suggesting accurate placement of the medication.  Advised to call if fevers/chills, erythema, induration, drainage, or persistent bleeding.  Images permanently stored and available for review in the ultrasound unit.  Impression: Technically successful ultrasound guided injection.  Impression and Recommendations:

## 2014-07-15 ENCOUNTER — Other Ambulatory Visit: Payer: Self-pay | Admitting: Family Medicine

## 2014-08-05 ENCOUNTER — Encounter: Payer: Self-pay | Admitting: Family Medicine

## 2014-08-05 ENCOUNTER — Ambulatory Visit (INDEPENDENT_AMBULATORY_CARE_PROVIDER_SITE_OTHER): Payer: 59 | Admitting: Family Medicine

## 2014-08-05 VITALS — BP 109/69 | HR 86 | Temp 98.6°F | Wt 181.0 lb

## 2014-08-05 DIAGNOSIS — Z0000 Encounter for general adult medical examination without abnormal findings: Secondary | ICD-10-CM

## 2014-08-05 DIAGNOSIS — Z0189 Encounter for other specified special examinations: Secondary | ICD-10-CM

## 2014-08-05 DIAGNOSIS — D509 Iron deficiency anemia, unspecified: Secondary | ICD-10-CM

## 2014-08-05 MED ORDER — ZOLPIDEM TARTRATE ER 12.5 MG PO TBCR: EXTENDED_RELEASE_TABLET | ORAL | Status: DC

## 2014-08-05 MED ORDER — ZOLPIDEM TARTRATE ER 6.25 MG PO TBCR: EXTENDED_RELEASE_TABLET | ORAL | Status: DC

## 2014-08-05 MED ORDER — PHENTERMINE HCL 37.5 MG PO CAPS: 37.5000 mg | ORAL_CAPSULE | ORAL | Status: DC

## 2014-08-05 NOTE — Progress Notes (Signed)
Subjective:    Patient ID: Rose Long, female    DOB: 02/06/1961, 53 y.o.   MRN: [id number]  HPI 5 day started with ST.  She has mild fever, congestion, cough.  She is feeling some better today. Getting flu shot at work.   She hasn't seen Dr. Dianah Field for her left knee. She did have her OB/GYN visit in August and is up-to-date. She does have a form for work that needs to be completed. She will fax it to our office.  She has been doing [employer] with hCG injections and has done well and continued to lose weight. Though has been quite expensive.She would like to discuss other options.  She was hoping that we'll be able to prescribe hCG shots .  She is using magnesium citrate for constipatoin.  6 tabs per night.    Review of Systems Comprehensive ROS is negative.    BP 109/69  Pulse 86  Temp(Src) 98.6 F (37 C)  Wt 181 lb (82.101 kg)    Allergies  Allergen Reactions  . Sulfa Antibiotics     Past Medical History  Diagnosis Date  . Arthritis   . Obesity   . Anemia   . Simple ovarian cyst     Right    Past Surgical History  Procedure Laterality Date  . 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 Nasal sinus surgery  1999  . Breast surgery  1998    reduction  . Uterine ablation  2012  . Under arm skin removed    . Breast surgery  10/2013    Lift  . Abdominoplasty/panniculectomy      History   Social History  . Marital Status: Single    Spouse Name: N/A    Number of Children: N/A  . Years of Education: Masters   Occupational History  . 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     Social History Main Topics  . Smoking status: Never Smoker   . Smokeless tobacco: Never Used  . Alcohol Use: 1.0 oz/week    2 drink(s) per week     Comment: socially  . Drug Use: No  . Sexual Activity: Not Currently   Other Topics Concern  . Not on file   Social History Narrative   Parents moved into her home on June 2013.  2 caffeine drinks per day.  Works out 1 hour 5-6  days per week.     Family History  Problem Relation Age of Onset  . Hypertension Mother   . Hypertension Father   . Heart disease Maternal Grandfather   . Lung cancer Paternal Grandmother     lung  . Alcohol abuse Paternal Uncle   . Diabetes Cousin   . Bladder Cancer Mother   . Hyperlipidemia Mother     Outpatient Encounter Prescriptions as of 08/05/2014  Medication Sig  . AMBULATORY NON FORMULARY MEDICATION Medication Name:  hcg/methyl/inositol  . AMBULATORY NON FORMULARY MEDICATION Medication Name: vitamin B3 complex  . calcium citrate-vitamin D 200-200 MG-UNIT TABS Take 1 tablet by mouth daily.    . Cholecalciferol (VITAMIN D3) 5000 UNITS TABS Take 1 tablet by mouth 2 (two) times daily.    . Cyanocobalamin (VITAMIN B-12) 500 MCG SUBL Place 1 tablet under the tongue daily.    . hydrocortisone-pramoxine (PROCTOFOAM HC) rectal foam Place 1 applicator rectally 2 (two) times daily.  Marland Kitchen MAGNESIUM CITRATE PO Take by mouth.  . Multiple Vitamin (MULTIVITAMIN) tablet Take 1 tablet by mouth daily.    Marland Kitchen  SUMAtriptan (IMITREX) 100 MG tablet TAKE 1 TABLET (100 MG TOTAL) BY MOUTH ONCE AS NEEDED FOR MIGRAINE.  Marland Kitchen tizanidine (ZANAFLEX) 6 MG capsule Take 1 capsule (6 mg total) by mouth 3 (three) times daily.  Marland Kitchen zolpidem (AMBIEN CR) 6.25 MG CR tablet TAKE ONE TABLET BY MOUTH AT BEDTIME AS NEEDED FOR SLEEP  . [DISCONTINUED] zolpidem (AMBIEN CR) 12.5 MG CR tablet TAKE ONE TABLET BY MOUTH AT BEDTIME AS NEEDED FOR SLEEP  . [DISCONTINUED] zolpidem (AMBIEN CR) 12.5 MG CR tablet TAKE ONE TABLET BY MOUTH AT BEDTIME AS NEEDED FOR SLEEP  . phentermine 37.5 MG capsule Take 1 capsule (37.5 mg total) by mouth every morning.          Objective:   Physical Exam  Constitutional: She is oriented to person, place, and time. She appears well-developed and well-nourished.  HENT:  Head: Normocephalic and atraumatic.  Cardiovascular: Normal rate, regular rhythm and normal heart sounds.   Pulmonary/Chest: Effort  normal and breath sounds normal.  Abdominal: Soft. Bowel sounds are normal. She exhibits no distension and no mass. There is no tenderness. There is no rebound and no guarding.  Musculoskeletal: She exhibits no edema.  Neurological: She is alert and oriented to person, place, and time. She has normal reflexes. No cranial nerve deficit.  Skin: Skin is warm and dry.  Psychiatric: She has a normal mood and affect. Her behavior is normal.        Assessment & Plan:  CPE- Keep up a regular exercise program and make sure you are eating a healthy diet Try to eat 4 servings of dairy a day, or if you are lactose intolerant take a calcium with vitamin D daily.  Your vaccines are up to date.   Abnormal weight gain - She has really done well overall.  Her highest weight was 215 pounds. She's down to 181 pounds. Has more than a 30 pound weight loss. We discussed phentermine as an option. She has no prior problems with cardiac issues. No chest pain or short of breath. Discussed the stimulant medication and the potential side effects of the medication.  Followup in one month for blood pressure weight check with the nurse.  Fatigue-she has been more fatigued than usual. The she has been sick. Would like to be tested for iron deficiency anemia again. She has been off of her IM while on current diet. Next paragraph upper respiratory infection-likely viral. If not better in the next 2-3 days then please call the office back and I will call an antibiotic for acute sinusitis. Chest is clear on exam today

## 2014-08-05 NOTE — Patient Instructions (Signed)
Keep up a regular exercise program and make sure you are eating a healthy diet Try to eat 4 servings of dairy a day, or if you are lactose intolerant take a calcium with vitamin D daily.  Your vaccines are up to date.   

## 2014-08-07 LAB — CBC WITH DIFFERENTIAL/PLATELET
BASOS ABS: 0.1 10*3/uL (ref 0.0–0.1)
BASOS PCT: 1 % (ref 0–1)
Eosinophils Absolute: 0.2 10*3/uL (ref 0.0–0.7)
Eosinophils Relative: 4 % (ref 0–5)
HCT: 33 % — ABNORMAL LOW (ref 36.0–46.0)
Hemoglobin: 10.3 g/dL — ABNORMAL LOW (ref 12.0–15.0)
LYMPHS PCT: 46 % (ref 12–46)
Lymphs Abs: 2.5 10*3/uL (ref 0.7–4.0)
MCH: 22.5 pg — ABNORMAL LOW (ref 26.0–34.0)
MCHC: 31.2 g/dL (ref 30.0–36.0)
MCV: 72.2 fL — ABNORMAL LOW (ref 78.0–100.0)
Monocytes Absolute: 0.6 10*3/uL (ref 0.1–1.0)
Monocytes Relative: 11 % (ref 3–12)
NEUTROS ABS: 2.1 10*3/uL (ref 1.7–7.7)
Neutrophils Relative %: 38 % — ABNORMAL LOW (ref 43–77)
PLATELETS: 398 10*3/uL (ref 150–400)
RBC: 4.57 MIL/uL (ref 3.87–5.11)
RDW: 18.4 % — AB (ref 11.5–15.5)
WBC: 5.5 10*3/uL (ref 4.0–10.5)

## 2014-08-08 ENCOUNTER — Other Ambulatory Visit: Payer: Self-pay | Admitting: *Deleted

## 2014-08-08 DIAGNOSIS — D649 Anemia, unspecified: Secondary | ICD-10-CM

## 2014-08-08 LAB — FERRITIN: Ferritin: 7 ng/mL — ABNORMAL LOW (ref 10–291)

## 2014-08-08 LAB — COMPLETE METABOLIC PANEL WITH GFR
ALK PHOS: 65 U/L (ref 39–117)
ALT: 12 U/L (ref 0–35)
AST: 14 U/L (ref 0–37)
Albumin: 3.4 g/dL — ABNORMAL LOW (ref 3.5–5.2)
BILIRUBIN TOTAL: 0.3 mg/dL (ref 0.2–1.2)
BUN: 12 mg/dL (ref 6–23)
CO2: 28 meq/L (ref 19–32)
CREATININE: 0.64 mg/dL (ref 0.50–1.10)
Calcium: 9.3 mg/dL (ref 8.4–10.5)
Chloride: 106 mEq/L (ref 96–112)
GFR, Est African American: >89 mL/min
GFR, Est Non African American: >89 mL/min
Glucose, Bld: 82 mg/dL (ref 70–99)
Potassium: 4.3 mEq/L (ref 3.5–5.3)
SODIUM: 142 meq/L (ref 135–145)
TOTAL PROTEIN: 6.5 g/dL (ref 6.0–8.3)

## 2014-08-08 LAB — LIPID PANEL
CHOL/HDL RATIO: 2.7 ratio
Cholesterol: 161 mg/dL (ref 0–200)
HDL: 60 mg/dL (ref >39)
LDL CALC: 86 mg/dL (ref 0–99)
TRIGLYCERIDES: 76 mg/dL (ref <150)
VLDL: 15 mg/dL (ref 0–40)

## 2014-08-12 ENCOUNTER — Other Ambulatory Visit: Payer: Self-pay | Admitting: Family Medicine

## 2014-08-12 ENCOUNTER — Telehealth: Payer: Self-pay | Admitting: *Deleted

## 2014-08-12 MED ORDER — FUSION PLUS PO CAPS: 1.0000 | ORAL_CAPSULE | Freq: Every day | ORAL | Status: DC

## 2014-08-12 NOTE — Telephone Encounter (Signed)
rx sent to [facility]

## 2014-08-12 NOTE — Telephone Encounter (Signed)
Pt stated that she would like Dr. Madilyn Fireman to send in Rx for iron supplement for her to take.Rose Long

## 2014-08-14 NOTE — Telephone Encounter (Signed)
Error

## 2014-08-15 ENCOUNTER — Other Ambulatory Visit: Payer: Self-pay | Admitting: Obstetrics & Gynecology

## 2014-08-15 ENCOUNTER — Telehealth: Payer: Self-pay | Admitting: *Deleted

## 2014-08-15 ENCOUNTER — Ambulatory Visit (INDEPENDENT_AMBULATORY_CARE_PROVIDER_SITE_OTHER): Payer: 59 | Admitting: Family Medicine

## 2014-08-15 DIAGNOSIS — N83209 Unspecified ovarian cyst, unspecified side: Secondary | ICD-10-CM

## 2014-08-15 DIAGNOSIS — Z23 Encounter for immunization: Secondary | ICD-10-CM

## 2014-08-15 NOTE — Progress Notes (Signed)
   Subjective:    Patient ID: Rose Long, female    DOB: May 08, 1961, 53 y.o.   MRN: [id number]  HPI   Pt here for a flu shot  Review of Systems     Objective:   Physical Exam        Assessment & Plan:

## 2014-08-15 NOTE — Telephone Encounter (Signed)
Pt states her insurance will not cover the fusion. She would like to know if there is anything comparable.

## 2014-08-15 NOTE — Telephone Encounter (Signed)
[place] Statistician and see if anything similar thea tmight be covered by insurance.

## 2014-08-16 ENCOUNTER — Ambulatory Visit (INDEPENDENT_AMBULATORY_CARE_PROVIDER_SITE_OTHER): Payer: 59

## 2014-08-16 DIAGNOSIS — N83209 Unspecified ovarian cyst, unspecified side: Secondary | ICD-10-CM

## 2014-08-16 DIAGNOSIS — D251 Intramural leiomyoma of uterus: Secondary | ICD-10-CM

## 2014-08-19 ENCOUNTER — Telehealth: Payer: Self-pay | Admitting: *Deleted

## 2014-08-19 NOTE — Telephone Encounter (Signed)
LM on voicemail of normal U/S  Cyst has resolved per Dr Gala Romney.

## 2014-08-19 NOTE — Telephone Encounter (Signed)
-----   Message from Guss Bunde, MD sent at 08/19/2014  9:56 AM EST ----- Nml pelvic US.  No cyst.  RN to call.

## 2014-08-21 NOTE — Telephone Encounter (Signed)
Called pharm in there is nothing comparable that will be covered by insurance. Called and let pt know. Her out of pocket expense with fusion is about $30

## 2014-09-02 ENCOUNTER — Ambulatory Visit: Payer: 59

## 2014-09-03 ENCOUNTER — Ambulatory Visit (INDEPENDENT_AMBULATORY_CARE_PROVIDER_SITE_OTHER): Payer: 59 | Admitting: Sports Medicine

## 2014-09-03 ENCOUNTER — Encounter: Payer: Self-pay | Admitting: Sports Medicine

## 2014-09-03 DIAGNOSIS — M170 Bilateral primary osteoarthritis of knee: Secondary | ICD-10-CM

## 2014-09-03 MED ORDER — TRAMADOL HCL 50 MG PO TABS: ORAL_TABLET | ORAL | Status: DC

## 2014-09-03 NOTE — Progress Notes (Signed)
  Subjective:    CC: left knee pain  HPI: This is a pleasant 53 year old female, she has x-ray confirmed bilateral knee osteoarthritis, pain is moderate, persistent, her most recent injection did not provide much of a response. At this point she is amenable to proceeding to the next step which would be Visco supplementation. Oral analgesics are also ineffective. Pain is not radiating.  Past medical history, Surgical history, Family history not pertinant except as noted below, Social history, Allergies, and medications have been entered into the medical record, reviewed, and no changes needed.   Review of Systems: No fevers, chills, night sweats, weight loss, chest pain, or shortness of breath.   Objective:    General: Well Developed, well nourished, and in no acute distress.  Neuro: Alert and oriented x3, extra-ocular muscles intact, sensation grossly intact.  HEENT: Normocephalic, atraumatic, pupils equal round reactive to light, neck supple, no masses, no lymphadenopathy, thyroid nonpalpable.  Skin: Warm and dry, no rashes. Cardiac: Regular rate and rhythm, no murmurs rubs or gallops, no lower extremity edema.  Respiratory: Clear to auscultation bilaterally. Not using accessory muscles, speaking in full sentences. Bilateral Knee: Left knee is more swollen than the right, there is medial joint line pain on both knees. ROM normal in flexion and extension and lower leg rotation. Ligaments with solid consistent endpoints including ACL, PCL, LCL, MCL. Negative Mcmurray's and provocative meniscal tests. Non painful patellar compression. Patellar and quadriceps tendons unremarkable. Hamstring and quadriceps strength is normal.  Impression and Recommendations:

## 2014-09-03 NOTE — Assessment & Plan Note (Signed)
Failure of NSAIDs and steroid injection. Knee brace on the left knee. We will get Visco supplementation/Orthovisc approved, she will can return to see me for her first injection. Okay to double book for injection if needed.

## 2014-09-06 ENCOUNTER — Telehealth: Payer: Self-pay | Admitting: *Deleted

## 2014-09-06 NOTE — Telephone Encounter (Signed)
Cannot do Buy&Bill for Orthovisc and has to be ordered thru specialty pharmacy. I sent this on to [facility]. Jeralynn will be notified and when we receive med she will be scheduled for injection. Lulamae verbalized understanding and would contact us once she hears from specialty pharmacy. Margette Fast, CMA

## 2014-09-06 NOTE — Telephone Encounter (Signed)
Yes, the order was for bilateral injections. It was forwarded to [facility] today.

## 2014-09-06 NOTE — Telephone Encounter (Signed)
Thank you, just to check you did send for 8 syringes so that we can do both knees correct?

## 2014-09-07 ENCOUNTER — Other Ambulatory Visit: Payer: Self-pay | Admitting: Nurse Practitioner

## 2014-09-30 ENCOUNTER — Telehealth: Payer: Self-pay

## 2014-09-30 NOTE — Telephone Encounter (Signed)
Patient called stated that she needs a plan of care with her knees. Rhonda Cunningham,CMA

## 2014-09-30 NOTE — Telephone Encounter (Signed)
Plan is the same as we discussed before in the office visit, Orthovisc injections, and if no response then likely surgery.

## 2014-10-01 NOTE — Telephone Encounter (Signed)
Left detailed message on patient mvm with instructions as noted below. Anysia Choi,CMA  

## 2014-10-23 ENCOUNTER — Other Ambulatory Visit: Payer: Self-pay | Admitting: *Deleted

## 2014-10-23 DIAGNOSIS — G43011 Migraine without aura, intractable, with status migrainosus: Secondary | ICD-10-CM

## 2014-10-23 MED ORDER — SUMATRIPTAN SUCCINATE 100 MG PO TABS: ORAL_TABLET | ORAL | Status: DC

## 2014-10-23 NOTE — Telephone Encounter (Signed)
RX sent to [facility] for RF on Imitrex per Macon Large, NP

## 2014-11-28 ENCOUNTER — Encounter (INDEPENDENT_AMBULATORY_CARE_PROVIDER_SITE_OTHER): Payer: Self-pay

## 2014-11-28 ENCOUNTER — Ambulatory Visit (INDEPENDENT_AMBULATORY_CARE_PROVIDER_SITE_OTHER): Payer: 59 | Admitting: Family Medicine

## 2014-11-28 ENCOUNTER — Encounter: Payer: Self-pay | Admitting: Family Medicine

## 2014-11-28 VITALS — BP 126/73 | HR 78 | Temp 98.5°F | Wt 190.0 lb

## 2014-11-28 DIAGNOSIS — J0100 Acute maxillary sinusitis, unspecified: Secondary | ICD-10-CM

## 2014-11-28 DIAGNOSIS — H9201 Otalgia, right ear: Secondary | ICD-10-CM

## 2014-11-28 DIAGNOSIS — D509 Iron deficiency anemia, unspecified: Secondary | ICD-10-CM

## 2014-11-28 MED ORDER — AMOXICILLIN-POT CLAVULANATE 875-125 MG PO TABS: 1.0000 | ORAL_TABLET | Freq: Two times a day (BID) | ORAL | Status: DC

## 2014-11-28 MED ORDER — PREDNISONE 20 MG PO TABS: 20.0000 mg | ORAL_TABLET | Freq: Every day | ORAL | Status: DC

## 2014-11-28 NOTE — Progress Notes (Signed)
   Subjective:    Patient ID: Rose Long, female    DOB: 10-Oct-1961, 54 y.o.   MRN: [id number]  HPI right ear and R jaw pain x 4 days she ahs been taking OTC meds not helping. yas really feels like the whole side of right face hurts.   Has had nasal congestion + HA.  Tender along the neck bilat as well.  No fever.    Review of Systems     Objective:   Physical Exam  Constitutional: She is oriented to person, place, and time. She appears well-developed and well-nourished.  HENT:  Head: Normocephalic and atraumatic.  Right Ear: External ear normal.  Left Ear: External ear normal.  Nose: Nose normal.  Mouth/Throat: Oropharynx is clear and moist.  TMs and canals are clear.   Eyes: Conjunctivae and EOM are normal. Pupils are equal, round, and reactive to light.  Neck: Neck supple. No thyromegaly present.  Cardiovascular: Normal rate, regular rhythm and normal heart sounds.   Pulmonary/Chest: Effort normal and breath sounds normal. She has no wheezes.  Lymphadenopathy:    She has no cervical adenopathy.  Neurological: She is alert and oriented to person, place, and time.  Skin: Skin is warm and dry.  Psychiatric: She has a normal mood and affect.          Assessment & Plan:  Acute right maxillary sinusitis - will tx with augmentin. Call if not better in one week She will call if not better or if worse.

## 2014-11-28 NOTE — Patient Instructions (Signed)

## 2014-11-29 LAB — IRON AND TIBC
%SAT: 7 % — ABNORMAL LOW (ref 20–55)
IRON: 33 ug/dL — AB (ref 42–145)
TIBC: 489 ug/dL — ABNORMAL HIGH (ref 250–470)
UIBC: 456 ug/dL — ABNORMAL HIGH (ref 125–400)

## 2014-11-29 LAB — FERRITIN: FERRITIN: 10 ng/mL (ref 10–291)

## 2014-11-29 LAB — HEMOGLOBIN: Hemoglobin: 12.3 g/dL (ref 12.0–15.0)

## 2014-12-09 ENCOUNTER — Other Ambulatory Visit: Payer: Self-pay | Admitting: Family Medicine

## 2014-12-10 ENCOUNTER — Telehealth: Payer: Self-pay | Admitting: *Deleted

## 2014-12-10 MED ORDER — AZITHROMYCIN 250 MG PO TABS: ORAL_TABLET | ORAL | Status: DC

## 2014-12-10 MED ORDER — SUVOREXANT 10 MG PO TABS: 1.0000 | ORAL_TABLET | Freq: Every evening | ORAL | Status: DC | PRN

## 2014-12-10 NOTE — Telephone Encounter (Signed)
Pt called and lvm stating that she is not feeling any better and would like a z pack to be sent to her pharmacy. I called her back and informed her that we would send rx for abx and she is also asking for stronger sleep medication to be sent. Pt informed that she can either take 5 mg of melatonin or we could send in new rx for belsomra for her to try. She stated that she would like to try new med. Left savings card up front for her to p/u .Audelia Hives [place]

## 2015-01-21 ENCOUNTER — Other Ambulatory Visit: Payer: Self-pay | Admitting: Family Medicine

## 2015-01-21 NOTE — Telephone Encounter (Signed)
Refilled the Belsomra. She states this medication is helping her sleep for 4-5 hours when she takes the medication.

## 2015-01-22 MED ORDER — SUVOREXANT 15 MG PO TABS: 15.0000 mg | ORAL_TABLET | Freq: Every day | ORAL | Status: DC

## 2015-01-22 NOTE — Telephone Encounter (Signed)
Let try bumping to 15mg . i will send a new rx.

## 2015-01-22 NOTE — Addendum Note (Signed)
Addended by: Beatrice Lecher D on: 01/22/2015 12:12 PM   Modules accepted: Orders

## 2015-01-23 ENCOUNTER — Telehealth: Payer: Self-pay | Admitting: Family Medicine

## 2015-01-23 NOTE — Telephone Encounter (Signed)
Received fax from [place] has been approved from 01/23/2015 - 01/23/2016 auth # [id number] - CF

## 2015-01-23 NOTE — Telephone Encounter (Signed)
Received fax on pa for belsomra sent through cover my meds waiting on auth. - CF

## 2015-05-21 ENCOUNTER — Encounter: Payer: Self-pay | Admitting: *Deleted

## 2015-05-23 ENCOUNTER — Telehealth: Payer: Self-pay | Admitting: *Deleted

## 2015-05-23 NOTE — Telephone Encounter (Signed)
Called and lvm informing pt that we received a letter from her Insurance company that [place] is not covered. I asked that if her insurance has changed that she either fax a copy of her card to [place] or bring it by to be scanned in so that we could process this medication PA for her. Advised to rtn call if any questions.Audelia Hives [place]

## 2015-05-26 ENCOUNTER — Telehealth: Payer: Self-pay | Admitting: Family Medicine

## 2015-05-26 NOTE — Telephone Encounter (Signed)
Received fax for prior authorization on Belsomra filled out form and faxed back to [facility] waiting on authorization. - CF

## 2015-06-02 ENCOUNTER — Encounter: Payer: Self-pay | Admitting: Family Medicine

## 2015-06-02 ENCOUNTER — Other Ambulatory Visit: Payer: Self-pay | Admitting: Family Medicine

## 2015-06-02 ENCOUNTER — Ambulatory Visit (INDEPENDENT_AMBULATORY_CARE_PROVIDER_SITE_OTHER): Payer: Managed Care, Other (non HMO) | Admitting: Family Medicine

## 2015-06-02 VITALS — BP 125/80 | HR 58 | Wt 205.0 lb

## 2015-06-02 DIAGNOSIS — G4700 Insomnia, unspecified: Secondary | ICD-10-CM

## 2015-06-02 DIAGNOSIS — R197 Diarrhea, unspecified: Secondary | ICD-10-CM | POA: Diagnosis not present

## 2015-06-02 LAB — LIPASE: Lipase: 32 U/L (ref 7–60)

## 2015-06-02 LAB — COMPLETE METABOLIC PANEL WITH GFR
ALT: 24 U/L (ref 6–29)
AST: 23 U/L (ref 10–35)
Albumin: 3.6 g/dL (ref 3.6–5.1)
Alkaline Phosphatase: 70 U/L (ref 33–130)
BILIRUBIN TOTAL: 0.4 mg/dL (ref 0.2–1.2)
BUN: 6 mg/dL — AB (ref 7–25)
CO2: 28 mmol/L (ref 20–31)
CREATININE: 0.67 mg/dL (ref 0.50–1.05)
Calcium: 9.3 mg/dL (ref 8.6–10.4)
Chloride: 105 mmol/L (ref 98–110)
GFR, Est African American: >89 mL/min (ref >=60)
GLUCOSE: 87 mg/dL (ref 65–99)
Potassium: 4.1 mmol/L (ref 3.5–5.3)
SODIUM: 142 mmol/L (ref 135–146)
TOTAL PROTEIN: 6.7 g/dL (ref 6.1–8.1)

## 2015-06-02 LAB — CBC WITH DIFFERENTIAL/PLATELET
Basophils Absolute: 0 10*3/uL (ref 0.0–0.1)
Basophils Relative: 1 % (ref 0–1)
Eosinophils Absolute: 0.1 10*3/uL (ref 0.0–0.7)
Eosinophils Relative: 2 % (ref 0–5)
HCT: 36.4 % (ref 36.0–46.0)
HEMOGLOBIN: 11.9 g/dL — AB (ref 12.0–15.0)
Lymphocytes Relative: 35 % (ref 12–46)
Lymphs Abs: 1.7 10*3/uL (ref 0.7–4.0)
MCH: 25.6 pg — ABNORMAL LOW (ref 26.0–34.0)
MCHC: 32.7 g/dL (ref 30.0–36.0)
MCV: 78.3 fL (ref 78.0–100.0)
MONO ABS: 0.4 10*3/uL (ref 0.1–1.0)
MPV: 10.4 fL (ref 8.6–12.4)
Monocytes Relative: 8 % (ref 3–12)
NEUTROS PCT: 54 % (ref 43–77)
Neutro Abs: 2.6 10*3/uL (ref 1.7–7.7)
Platelets: 332 10*3/uL (ref 150–400)
RBC: 4.65 MIL/uL (ref 3.87–5.11)
RDW: 15.9 % — ABNORMAL HIGH (ref 11.5–15.5)
WBC: 4.9 10*3/uL (ref 4.0–10.5)

## 2015-06-02 MED ORDER — CIPROFLOXACIN HCL 500 MG PO TABS: 500.0000 mg | ORAL_TABLET | Freq: Two times a day (BID) | ORAL | Status: AC

## 2015-06-02 MED ORDER — SUVOREXANT 15 MG PO TABS: 15.0000 mg | ORAL_TABLET | Freq: Every day | ORAL | Status: DC

## 2015-06-02 NOTE — Progress Notes (Signed)
   Subjective:    Patient ID: Rose Long, female    DOB: [birth year], 54 y.o.   MRN: [id number]  HPI 3 weeks of abdominal pain after eating and drinking. Getting some diarrhea with more mucous.  More gas and urgency.  Foul smelling stool.  No fever, N/V.  Says the discomfort is mild. No travel outside the country.  No known exposure to someone with c diff.  Father was in hospital in May for CHF.  Trace amount of blood in teh stool. Having 4 BMs per day.    Insomnia- needs refill on Belsomra.  She wants to know if she can use Benadryl until gets a refill on medication   Review of Systems     Objective:   Physical Exam  Constitutional: She is oriented to person, place, and time. She appears well-developed and well-nourished.  HENT:  Head: Normocephalic and atraumatic.  Cardiovascular: Normal rate, regular rhythm and normal heart sounds.   Pulmonary/Chest: Effort normal and breath sounds normal.  Abdominal: Soft. Bowel sounds are normal. She exhibits no distension and no mass. There is tenderness. There is no rebound and no guarding.  Tender to tap palpation around the umbilicus.  Neurological: She is alert and oriented to person, place, and time.  Skin: Skin is warm and dry.  Psychiatric: She has a normal mood and affect. Her behavior is normal.          Assessment & Plan:  Diarrhea-possibly viral or bacterial. Will do a stool culture as well as check for C. difficile. She has been visiting people including her father and her friend out of a in the hospital over the last several months. She has not been on any recent antibioti. After she gets blood work and symmetric the stool culture then can fill the prescription for Cipro.  Insomnia-we'll refill the Belsomra. Now that she has new insurance she will likely need a new prior authorization. Okay to use up to 50 mg of Benadryl at bedtime. This is safe to use on a daily basis if needed.

## 2015-06-03 LAB — FERRITIN: FERRITIN: 8 ng/mL — AB (ref 10–291)

## 2015-06-03 LAB — VITAMIN B12: Vitamin B-12: 481 pg/mL (ref 211–911)

## 2015-06-05 ENCOUNTER — Telehealth: Payer: Self-pay | Admitting: *Deleted

## 2015-06-05 LAB — CLOSTRIDIUM DIFFICILE BY PCR

## 2015-06-05 NOTE — Telephone Encounter (Signed)
[facility] called to let us know that the pt's c diff culture has been discontinued due to formed stools.

## 2015-06-05 NOTE — Telephone Encounter (Signed)
OK, that's fine. Please call pt and let her know too. Thank you.

## 2015-06-06 NOTE — Telephone Encounter (Signed)
LMOM notifying pt.

## 2015-06-07 LAB — STOOL CULTURE

## 2015-06-13 ENCOUNTER — Ambulatory Visit: Payer: Self-pay | Admitting: Sports Medicine

## 2015-06-16 ENCOUNTER — Ambulatory Visit (INDEPENDENT_AMBULATORY_CARE_PROVIDER_SITE_OTHER): Payer: Managed Care, Other (non HMO) | Admitting: Sports Medicine

## 2015-06-16 ENCOUNTER — Ambulatory Visit (INDEPENDENT_AMBULATORY_CARE_PROVIDER_SITE_OTHER): Payer: Managed Care, Other (non HMO)

## 2015-06-16 ENCOUNTER — Encounter: Payer: Self-pay | Admitting: Sports Medicine

## 2015-06-16 ENCOUNTER — Telehealth: Payer: Self-pay | Admitting: Family Medicine

## 2015-06-16 VITALS — BP 124/69 | HR 58 | Wt 205.0 lb

## 2015-06-16 DIAGNOSIS — M170 Bilateral primary osteoarthritis of knee: Secondary | ICD-10-CM

## 2015-06-16 DIAGNOSIS — M19071 Primary osteoarthritis, right ankle and foot: Secondary | ICD-10-CM | POA: Diagnosis not present

## 2015-06-16 DIAGNOSIS — M19072 Primary osteoarthritis, left ankle and foot: Secondary | ICD-10-CM | POA: Diagnosis not present

## 2015-06-16 DIAGNOSIS — M7021 Olecranon bursitis, right elbow: Secondary | ICD-10-CM

## 2015-06-16 NOTE — Assessment & Plan Note (Signed)
This appears to be simple traumatic olecranon bursitis. Elbow sleeve, return to see me in one month for this, if still present and still symptomatically we'll consider an aspiration and injection procedure.

## 2015-06-16 NOTE — Progress Notes (Signed)
  Subjective:    CC: left knee pain  HPI: This is a pleasant 54 year old female with known osteoarthritis of the knee,we had done an injection in the past that provided a moderate response, unfortunately she was unable to afford viscous supplementation. At this point she is ready to try again, pain is moderate, persistent, localized at the joint lines on both knees, no radiation. No mechanical symptoms.  Right elbow swelling: After bumping it on an object, swelling is over the olecranon, nontender, minimal, no erythema, no constitutional symptoms.  Past medical history, Surgical history, Family history not pertinant except as noted below, Social history, Allergies, and medications have been entered into the medical record, reviewed, and no changes needed.   Review of Systems: No fevers, chills, night sweats, weight loss, chest pain, or shortness of breath.   Objective:    General: Well Developed, well nourished, and in no acute distress.  Neuro: Alert and oriented x3, extra-ocular muscles intact, sensation grossly intact.  HEENT: Normocephalic, atraumatic, pupils equal round reactive to light, neck supple, no masses, no lymphadenopathy, thyroid nonpalpable.  Skin: Warm and dry, no rashes. Cardiac: Regular rate and rhythm, no murmurs rubs or gallops, no lower extremity edema.  Respiratory: Clear to auscultation bilaterally. Not using accessory muscles, speaking in full sentences. Left Knee: Normal to inspection with no erythema or effusion or obvious bony abnormalities. Tender to palpation at the joint lines ROM normal in flexion and extension and lower leg rotation. Ligaments with solid consistent endpoints including ACL, PCL, LCL, MCL. Negative Mcmurray's and provocative meniscal tests. Non painful patellar compression. Patellar and quadriceps tendons unremarkable. Hamstring and quadriceps strength is normal. Right Elbow: Minimal olecranon bursitis without erythema, or  tenderness. Range of motion full pronation, supination, flexion, extension. Strength is full to all of the above directions Stable to varus, valgus stress. Negative moving valgus stress test. No discrete areas of tenderness to palpation. Ulnar nerve does not sublux. Negative cubital tunnel Tinel's.  Impression and Recommendations:

## 2015-06-16 NOTE — Telephone Encounter (Signed)
Submitted for approval on Orthovisc. Awaiting confirmation.  

## 2015-06-16 NOTE — Assessment & Plan Note (Signed)
Bilateral knee osteoarthritis, failed steroids injection. We are going to start viscous supplementation, we do need x-rays. Return to start viscous supplementation once syringes are obtained.

## 2015-06-16 NOTE — Telephone Encounter (Signed)
-----   Message from Silverio Decamp, MD sent at 06/16/2015 10:37 AM EDT ----- Reubin Milan, this is x-ray confirmed bilateral osteoarthritis that has failed steroid injection last year as well as oral medications, lets get her approved for Visco supplementation. ___________________________________________ Gwen Her. Dianah Field, M.D., ABFM., CAQSM. Primary Care and [place] [employer] of [place] [facility]  PS Fayrene Fearing Boogs

## 2015-06-19 NOTE — Telephone Encounter (Signed)
I called [employer] to check on status of Belsomra they stated they did not received the prior authorization request so I spoke with [facility] and did the authorization over the phone. Belsomra 15 mg is approved indefinitely reference #[id number]

## 2015-06-27 NOTE — Telephone Encounter (Signed)
Received information from OV line:  This is an open access plus plan. The effective date is 04/18/2015. Patient has an Hamler! Do not collect until claim has been processed. No PCP referral required. Precertification is required and can be obtained by calling [phone]. B-8485 is covered at 80% of the contracted rate when administered in an office setting. 20610 is covered at 80% of the contracted rate when performed in an office setting. The reference number is [id number]  Spoke with Pt, would like to proceed with Pre-cert. Will begin and notify Pt when we get information back.

## 2015-06-30 NOTE — Telephone Encounter (Signed)
[facility], spoke with Seth Bake. Submitted information for pre-certification. Confirmation #: [id number]. Will get decision via fax in 5-7 business days.

## 2015-07-08 NOTE — Telephone Encounter (Signed)
[facility], spoke with Cher Nakai, was informed OV is authorized. Auth #: [id number]. Valid dates: 07/02/15-08/18/15.  Called Pt, states she has been in communication with insurance company regarding setting up a payment plan. When the Pt gave me the information (estimated OOP of around $3000) this did not include the injection administration fee. Gave Pt proper CPT and J-Codes and advised to contact her insurance again to make sure her payment plan would not change. Verbalized understanding. Pt will contact me when ready to schedule and if she is going to get the injections from [place] or the speciality pharmacy.

## 2015-07-14 ENCOUNTER — Encounter: Payer: Self-pay | Admitting: Sports Medicine

## 2015-07-14 ENCOUNTER — Ambulatory Visit (INDEPENDENT_AMBULATORY_CARE_PROVIDER_SITE_OTHER): Payer: Managed Care, Other (non HMO) | Admitting: Sports Medicine

## 2015-07-14 VITALS — BP 121/70 | HR 60 | Ht 66.5 in | Wt 205.0 lb

## 2015-07-14 DIAGNOSIS — M7061 Trochanteric bursitis, right hip: Secondary | ICD-10-CM

## 2015-07-14 DIAGNOSIS — M7021 Olecranon bursitis, right elbow: Secondary | ICD-10-CM | POA: Diagnosis not present

## 2015-07-14 DIAGNOSIS — M7062 Trochanteric bursitis, left hip: Secondary | ICD-10-CM | POA: Diagnosis not present

## 2015-07-14 DIAGNOSIS — M170 Bilateral primary osteoarthritis of knee: Secondary | ICD-10-CM

## 2015-07-14 NOTE — Assessment & Plan Note (Signed)
There is a slight prominence of the olecranon bursa on the right side however this is asymptomatic, no further treatment needed.

## 2015-07-14 NOTE — Assessment & Plan Note (Addendum)
Patient will return start Orthovisc, she does desire that we do the left knee, and hold onto the right knee Orthovisc syringes until she is ready. Supposedly these will be mailed to [place].

## 2015-07-14 NOTE — Progress Notes (Signed)
  Subjective:    CC: Follow-up  HPI:  Right for medical echo bursitis: Resolved.  Bilateral knee osteoarthritis: Orthovisc is on its way, she simply wants to do the left knee first and save vials for later to the right knee.  Past medical history, Surgical history, Family history not pertinant except as noted below, Social history, Allergies, and medications have been entered into the medical record, reviewed, and no changes needed.   Review of Systems: No fevers, chills, night sweats, weight loss, chest pain, or shortness of breath.   Objective:    General: Well Developed, well nourished, and in no acute distress.  Neuro: Alert and oriented x3, extra-ocular muscles intact, sensation grossly intact.  HEENT: Normocephalic, atraumatic, pupils equal round reactive to light, neck supple, no masses, no lymphadenopathy, thyroid nonpalpable.  Skin: Warm and dry, no rashes. Cardiac: Regular rate and rhythm, no murmurs rubs or gallops, no lower extremity edema.  Respiratory: Clear to auscultation bilaterally. Not using accessory muscles, speaking in full sentences.  Impression and Recommendations:   I spent 25 minutes with this patient, greater than 50% was face-to-face time counseling regarding the above diagnoses

## 2015-07-14 NOTE — Assessment & Plan Note (Signed)
Rehabilitation exercises given, having a recurrence of left-sided trochanteric bursitis.

## 2015-07-18 ENCOUNTER — Telehealth: Payer: Self-pay | Admitting: Family Medicine

## 2015-07-18 NOTE — Telephone Encounter (Signed)
Will need to come in for office visits so that we can document that area of concern and then sent for a diagnostic mammogram which is a little bit different than a routine screening mammogram.

## 2015-07-18 NOTE — Telephone Encounter (Signed)
Pt called to state she noticed a lump on her left breast about two weeks ago and wants to know if she needs to come in for a visit or if an order for a mammogram can just be placed. Will route to PCP for review.

## 2015-07-21 ENCOUNTER — Ambulatory Visit: Payer: Managed Care, Other (non HMO) | Admitting: Sports Medicine

## 2015-07-21 ENCOUNTER — Encounter: Payer: Self-pay | Admitting: Sports Medicine

## 2015-07-21 ENCOUNTER — Ambulatory Visit (INDEPENDENT_AMBULATORY_CARE_PROVIDER_SITE_OTHER): Payer: Managed Care, Other (non HMO) | Admitting: Sports Medicine

## 2015-07-21 VITALS — BP 147/75 | HR 70 | Ht 66.5 in | Wt 207.0 lb

## 2015-07-21 DIAGNOSIS — M545 Low back pain, unspecified: Secondary | ICD-10-CM

## 2015-07-21 DIAGNOSIS — M170 Bilateral primary osteoarthritis of knee: Secondary | ICD-10-CM | POA: Diagnosis not present

## 2015-07-21 MED ORDER — CYCLOBENZAPRINE HCL 10 MG PO TABS: ORAL_TABLET | ORAL | Status: DC

## 2015-07-21 MED ORDER — PREDNISONE 50 MG PO TABS: ORAL_TABLET | ORAL | Status: DC

## 2015-07-21 NOTE — Assessment & Plan Note (Signed)
X-rays in the past did show moderate L4-L5 degenerative changes, we are going to start formal physical therapy, prednisone, Flexeril at bedtime. Neck slight return to see me in one month, MRI for interventional injection planning if no better.

## 2015-07-21 NOTE — Telephone Encounter (Signed)
Pt has been scheduled for in office follow up.

## 2015-07-21 NOTE — Progress Notes (Signed)
  Subjective:    CC: Start viscous supplementation  HPI: Bilateral knee arthritis: Here for Orthovisc injection #1 of 4  Back pain: With radiation to the left lateral hip, worse with sitting, flexion, driving, moderate, persistent, no bowel or bladder dysfunction or saddle numbness.  Past medical history, Surgical history, Family history not pertinant except as noted below, Social history, Allergies, and medications have been entered into the medical record, reviewed, and no changes needed.   Review of Systems: No fevers, chills, night sweats, weight loss, chest pain, or shortness of breath.   Objective:    General: Well Developed, well nourished, and in no acute distress.  Neuro: Alert and oriented x3, extra-ocular muscles intact, sensation grossly intact.  HEENT: Normocephalic, atraumatic, pupils equal round reactive to light, neck supple, no masses, no lymphadenopathy, thyroid nonpalpable.  Skin: Warm and dry, no rashes. Cardiac: Regular rate and rhythm, no murmurs rubs or gallops, no lower extremity edema.  Respiratory: Clear to auscultation bilaterally. Not using accessory muscles, speaking in full sentences. Back Exam:  Inspection: Unremarkable  Motion: Flexion 45 deg, Extension 45 deg, Side Bending to 45 deg bilaterally,  Rotation to 45 deg bilaterally  SLR laying: Negative  XSLR laying: Negative  Palpable tenderness: Left quadratus lumborum. FABER: negative. Sensory change: Gross sensation intact to all lumbar and sacral dermatomes.  Reflexes: 2+ at both patellar tendons, 2+ at achilles tendons, Babinski's downgoing.  Strength at foot  Plantar-flexion: 5/5 Dorsi-flexion: 5/5 Eversion: 5/5 Inversion: 5/5  Leg strength  Quad: 5/5 Hamstring: 5/5 Hip flexor: 5/5 Hip abductors: 5/5  Gait unremarkable.  Procedure: Real-time Ultrasound Guided Injection of left knee Device: GE Logiq E  Verbal informed consent obtained.  Time-out conducted.  Noted no overlying erythema,  induration, or other signs of local infection.  Skin prepped in a sterile fashion.  Local anesthesia: Topical Ethyl chloride.  With sterile technique and under real time ultrasound guidance:   30 mg/2 mL of OrthoVisc (sodium hyaluronate) in a prefilled syringe was injected easily into the knee through a 22-gauge needle. Completed without difficulty  Pain immediately resolved suggesting accurate placement of the medication.  Advised to call if fevers/chills, erythema, induration, drainage, or persistent bleeding.  Images permanently stored and available for review in the ultrasound unit.  Impression: Technically successful ultrasound guided injection.  Procedure: Real-time Ultrasound Guided Injection of right knee Device: GE Logiq E  Verbal informed consent obtained.  Time-out conducted.  Noted no overlying erythema, induration, or other signs of local infection.  Skin prepped in a sterile fashion.  Local anesthesia: Topical Ethyl chloride.  With sterile technique and under real time ultrasound guidance:   30 mg/2 mL of OrthoVisc (sodium hyaluronate) in a prefilled syringe was injected easily into the knee through a 22-gauge needle. Completed without difficulty  Pain immediately resolved suggesting accurate placement of the medication.  Advised to call if fevers/chills, erythema, induration, drainage, or persistent bleeding.  Images permanently stored and available for review in the ultrasound unit.  Impression: Technically successful ultrasound guided injection.  Impression and Recommendations:

## 2015-07-21 NOTE — Assessment & Plan Note (Signed)
Orthovisc injection #1 of 4 into both knees, return in one week for #2

## 2015-07-22 ENCOUNTER — Ambulatory Visit: Payer: Managed Care, Other (non HMO) | Admitting: Sports Medicine

## 2015-07-22 ENCOUNTER — Encounter: Payer: Self-pay | Admitting: Family Medicine

## 2015-07-22 ENCOUNTER — Ambulatory Visit (INDEPENDENT_AMBULATORY_CARE_PROVIDER_SITE_OTHER): Payer: Managed Care, Other (non HMO) | Admitting: Family Medicine

## 2015-07-22 VITALS — BP 123/59 | HR 84 | Wt 205.0 lb

## 2015-07-22 DIAGNOSIS — N6311 Unspecified lump in the right breast, upper outer quadrant: Secondary | ICD-10-CM

## 2015-07-22 DIAGNOSIS — N63 Unspecified lump in breast: Secondary | ICD-10-CM | POA: Diagnosis not present

## 2015-07-22 NOTE — Progress Notes (Signed)
   Subjective:    Patient ID: Rose Long, female    DOB: [birth year], 54 y.o.   MRN: [id number]  HPI   Patient came in today as she had noticed a lump on her left breast. It was along the medial border of the areola. She just found it about a week ago. She does remember it being present before that. It is not bothersome. Not painful or itchy or irritated. She denies any trauma to the area. Her last mammogram was about a year and half ago and was normal. The she did have one in 2014 require diagnostic. It was normal though. Prior history of breast reduction in 1998 and then a breast lift in January 2010.   Review of Systems     Objective:   Physical Exam  Constitutional: She is oriented to person, place, and time. She appears well-developed and well-nourished.  HENT:  Head: Normocephalic and atraumatic.  Eyes: Conjunctivae and EOM are normal.  Cardiovascular: Normal rate.   Pulmonary/Chest: Effort normal.    Neurological: She is alert and oriented to person, place, and time.  Skin: Skin is dry. No pallor.  Psychiatric: She has a normal mood and affect. Her behavior is normal.          Assessment & Plan:   left breast lump-recommend referral for diagnostic mammogram and ultrasound. We'll call with results once available. Consider that the lesion could also be an epidermal cyst.

## 2015-07-28 ENCOUNTER — Encounter: Payer: Self-pay | Admitting: Rehabilitative and Restorative Service Providers"

## 2015-07-28 ENCOUNTER — Encounter: Payer: Self-pay | Admitting: Sports Medicine

## 2015-07-28 ENCOUNTER — Ambulatory Visit (INDEPENDENT_AMBULATORY_CARE_PROVIDER_SITE_OTHER): Payer: Managed Care, Other (non HMO) | Admitting: Sports Medicine

## 2015-07-28 ENCOUNTER — Ambulatory Visit (INDEPENDENT_AMBULATORY_CARE_PROVIDER_SITE_OTHER): Payer: Managed Care, Other (non HMO) | Admitting: Rehabilitative and Restorative Service Providers"

## 2015-07-28 VITALS — BP 130/79 | HR 67

## 2015-07-28 DIAGNOSIS — R29898 Other symptoms and signs involving the musculoskeletal system: Secondary | ICD-10-CM | POA: Diagnosis not present

## 2015-07-28 DIAGNOSIS — M623 Immobility syndrome (paraplegic): Secondary | ICD-10-CM | POA: Diagnosis not present

## 2015-07-28 DIAGNOSIS — M19032 Primary osteoarthritis, left wrist: Secondary | ICD-10-CM | POA: Diagnosis not present

## 2015-07-28 DIAGNOSIS — R531 Weakness: Secondary | ICD-10-CM

## 2015-07-28 DIAGNOSIS — Z7409 Other reduced mobility: Secondary | ICD-10-CM

## 2015-07-28 DIAGNOSIS — M2560 Stiffness of unspecified joint, not elsewhere classified: Secondary | ICD-10-CM

## 2015-07-28 DIAGNOSIS — R6889 Other general symptoms and signs: Secondary | ICD-10-CM

## 2015-07-28 DIAGNOSIS — M170 Bilateral primary osteoarthritis of knee: Secondary | ICD-10-CM | POA: Diagnosis not present

## 2015-07-28 DIAGNOSIS — M545 Low back pain, unspecified: Secondary | ICD-10-CM

## 2015-07-28 DIAGNOSIS — R293 Abnormal posture: Secondary | ICD-10-CM | POA: Diagnosis not present

## 2015-07-28 NOTE — Therapy (Signed)
[place], [place], [address] Phone: [phone]   Fax:  [phone]  Physical Therapy Evaluation  Patient Details  Name: Rose Long MRN: [id number] Date of Birth: 03/09/1961 Referring Provider:  Silverio Decamp,*  Encounter Date: 07/28/2015      PT End of Session - 07/28/15 1652    Visit Number 1   Number of Visits 12   Date for PT Re-Evaluation 09/08/15   PT Start Time 1700   PT Stop Time 1703   PT Time Calculation (min) 58 min      Past Medical History  Diagnosis Date  . Arthritis   . Obesity   . Anemia   . Simple ovarian cyst     Right    Past Surgical History  Procedure Laterality Date  . 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 Nasal sinus surgery  1999  . Breast surgery  1998    reduction  . Uterine ablation  2012  . Under arm skin removed    . Breast surgery  10/2013    Lift  . Abdominoplasty/panniculectomy      There were no vitals filed for this visit.  Visit Diagnosis:  Left-sided low back pain without sciatica - Plan: PT plan of care cert/re-cert  Abnormal posture - Plan: PT plan of care cert/re-cert  Stiffness due to immobility - Plan: PT plan of care cert/re-cert  Weakness of left leg - Plan: PT plan of care cert/re-cert  Decreased strength, endurance, and mobility - Plan: PT plan of care cert/re-cert      Subjective Assessment - 07/28/15 1609    Subjective [name] reports that she has chronic orthopedic conditions including knee/hip/back/neck problems. She c/o  pain into the Lt pelvis. Lt knee has been more painful in past 18 months. Lt pelvic pain has been present for the past 2 months.    Pertinent History Hip dysplasia; valgus knee deformity; morbid obesity; chronic knee sprains/ankle spraind/LBP over the course of the past 30 years; breast reduction. follow up for brest lump scheduled next week   How long can you sit comfortably? 30 min    How long can  you stand comfortably? 30 min   How long can you walk comfortably? 30 min    Diagnostic tests xray/MRI ~ 18 months ago   Patient Stated Goals pain reduction - wants to be able to exercise with less pain    Currently in Pain? Yes   Pain Score 4    Pain Location Back   Pain Orientation Left   Pain Descriptors / Indicators Nagging  pinching   Pain Radiating Towards along the Lt pelvic crest    Pain Onset More than a month ago   Pain Frequency Intermittent   Aggravating Factors  sitting > 30 min; lying down on either side Lt > Rt; ascending/descending stairs   Pain Relieving Factors shifting; moving; meds   Effect of Pain on Daily Activities limited ADL's            [facility] PT Assessment - 07/28/15 0001    Assessment   Medical Diagnosis Lt LBP   Onset Date/Surgical Date 06/03/15   Hand Dominance Left   Next MD Visit 08/04/15   Prior Therapy not for back - some for neck and knee; chriopractic care for LB   Precautions   Precautions None   Balance Screen   Has the patient fallen in the past 6 months No   Has the patient  had a decrease in activity level because of a fear of falling?  No   Is the patient reluctant to leave their home because of a fear of falling?  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    Leisure caring for aging parents; household chores; exercise 3-4 x/wk - walking 35 min neighborhood and zumba 1 x wk    Observation/Other Assessments   Focus on Therapeutic Outcomes (FOTO)  44% limitation    Sensation   Additional Comments --   Posture/Postural Control   Posture Comments head forward; shoulders rounded; increaed thoracic kyphosis; decresed lumbar lordosis; flexed  forward at hips; valgus knees - Lt >> Rt with Lt hip anteriorly rotated and internally rotated; decreased wt bearing Lt LE; shortened through Lt trunk   AROM   Right Hip Extension 10   Right Hip Flexion 95   Left Hip Extension -10   Left Hip Flexion 80   Left Knee Extension -8   Left Knee Flexion 87   Lumbar Flexion 70%  pulling and painful Lt LB   Lumbar Extension 50%  pain Lt LB   Lumbar - Right Side Bend 60%   Lumbar - Left Side Bend 65%  painful Lt LB   Lumbar - Right Rotation 30%  pulling LB   Lumbar - Left Rotation 25%  pulling LB   Strength   Right/Left Hip --  Rt hip 5-/5    Left Hip Flexion 4+/5   Left Hip Extension 4/5   Left Hip ABduction 4/5   Left Hip ADduction 4+/5   Right/Left Knee --  Rt knee 5-/5   Left Knee Flexion 4+/5   Left Knee Extension 4/5   Flexibility   Hamstrings Rt 65 deg; Lt 62 deg   Quadriceps Rt 88 deg; Lt 84 deg    ITB tight bilat   Piriformis tight Rt > Lt w/ pulling into the Lt LB   Palpation   Spinal mobility pain with spring testing L5 - L1    SI assessment  tender and tight Lt > Rt   Palpation comment muscular tightness through the hip flexors; abs; piriformis and hip abductors; QL; lats Lt > Rt    Ambulation/Gait   Gait Comments ambulates with posture as noted above with decreased wt bearing Lt LE weight shifted to the Rt                    [facility] Adult PT Treatment/Exercise - 07/28/15 0001    Self-Care   Self-Care Posture   Posture Reviewed supine to/from sit via log roll.  Educated pt to engage core with sit to/from stand. Pt returned demo 2 x.    Exercises   Exercises Knee/Hip;Lumbar   Lumbar Exercises: Supine   Ab Set 5 reps;5 seconds  pt educated on 3 part core   AB Set Limitations required multiple cues to engage proper muscles    Clam 5 reps;1 second  each leg, with ab set   Bent Knee Raise 15 reps  with abd set   Knee/Hip Exercises: Stretches   Quad Stretch Left;3 reps;30 seconds   Quad Stretch  Limitations supine with strap   Other Knee/Hip Stretches  seated lateral trunk flexion - changed to modified childs pose with lateral trunk flexion (seated in chair/ arms on counter) x 30 sec x 2 reps)                PT Education - 07/28/15 1702    Education provided Yes   Education Details HEP; postural infulences on musculoskeletal conditions; need for modification of walking program to decrese stress through LE chain and LB    Person(s) Educated Patient   Methods Explanation;Handout;Verbal cues   Comprehension Verbalized understanding;Returned demonstration;Verbal cues required;Tactile cues required             PT Long Term Goals - 07/28/15 1705    PT LONG TERM GOAL #1   Title Patient I in HEP 09/08/15    Time 6   Period Weeks   Status New   PT LONG TERM GOAL #2   Title Patient to identify modification for aerobic exercise program to identify the least stressful mode of exercise for orthopedic conditions 09/08/15   Time 6   Period Weeks   Status New   PT LONG TERM GOAL #3   Title Improve flexibility through bilat hips and knees by 5-7 degrees 09/08/15   Time 6   Period Weeks   Status New   PT LONG TERM GOAL #4   Title Decrease intensity of pain with functional activities to 3/10 - 4/10 50-70% of day 09/08/15   Time 6   Period Weeks   Status New   PT LONG TERM GOAL #5   Title Improve FOTO to </= 31% limitation 09/08/15   Time 6   Period Weeks   Status New               Plan - 07/28/15 1653    Clinical Impression Statement [name] presents with Lt LB pain which is chronic in nature and related to postural abnormality from childhood orthopedic problems. She has poor posture and alignment; decresaed weigh bearing Lt LE; shortened myofacila tissues through Lt side and LE; decreased ROM/mobility; decreased strength Lt LE; limited endurance and functional activity level; pain on a daily/constant basis. She will benefit from PT to address problems identified  and instruct patient in appropriate HEP; improving function. and decreasing pain to more manageable level.    Pt will benefit from skilled therapeutic intervention in order to improve on the following deficits Postural dysfunction;Improper body mechanics;Pain;Decreased range of motion;Decreased mobility;Decreased strength;Decreased endurance;Decreased activity tolerance;Increased fascial restricitons   Rehab Potential Good   PT Frequency 2x / week   PT Duration 6 weeks   PT Treatment/Interventions Patient/family education;ADLs/Self Care Home Management;Therapeutic exercise;Therapeutic activities;Manual techniques;Dry needling;Cryotherapy;Electrical Stimulation;Moist Heat;Ultrasound   PT Next Visit Plan review HEP; progress with stretching and core stabilization as indicated - working to identify least stressful exercise program considering structural changes and postural/wt bearing limitations   PT Home Exercise Plan discussion of need for less stressful HEP(consider water exercise program); ergonomic suggestions/recommendations; HEP stretching and core stabilization; body mechanics   Consulted and Agree with Plan of Care Patient         Problem List Patient Active Problem List   Diagnosis Date Noted  . Primary osteoarthritis of left wrist 07/28/2015  . Olecranon bursitis of right elbow 06/16/2015  . Hemorrhoid 06/12/2014  . Absolute anemia 06/12/2014  . Abnormal facial hair 08/03/2013  . Trochanteric bursitis of both hips 06/12/2013  . Low back pain 05/21/2013  . Osteoarthritis of both knees 05/04/2013  . Metatarsalgia of left foot 05/04/2013  .  Obesity 02/22/2012  . Migraine without aura 11/23/2011  . Chronic daily headache 11/23/2011  . Muscle spasm 11/23/2011  . Insomnia 11/23/2011  . RHEUMATOID ARTHRITIS 08/15/2008    Ismar Yabut Nilda Simmer  PT, MPH  07/28/2015, 5:16 PM  [facility] [place], [place],  [address] Phone: [phone]   Fax:  [phone]

## 2015-07-28 NOTE — Patient Instructions (Signed)
  Abdominal Bracing With Pelvic Floor (Hook-Lying)   With neutral spine, tighten pelvic floor and abdominals. Hold 5-10 seconds. Repeat __10_ times. Do _1__ times a day.   Knee to Chest: Transverse Plane Stability   Bring one knee up, then return. Be sure pelvis does not roll side to side. Keep pelvis still. Lift knee __10_ times each leg. Restabilize pelvis. Repeat with other leg. Do _1-2__ sets, _1__ times per day.             Hip External Rotation With Pillow: Transverse Plane Stability   One knee bent, one leg straight, on pillow. Slowly roll bent knee out. Be sure pelvis does not rotate. Do _10__ times. Restabilize pelvis. Repeat with other leg. Do _1-2__ sets, _1__ times per day.  Heel Slide: 4-10 Inches - Transverse Plane Stability   Slide heel 4 inches down. Be sure pelvis does not rotate. Do _10__ times. Restabilize pelvis. Repeat with other leg. Do __1_ sets, _1__ times per day.  HIP: Hamstrings - Supine   Place strap around foot. Raise leg up, keeping knee straight.  Bend opposite knee to protect back if indicated. Hold 30 seconds. 3 reps per set, 2-3 sets per day     Outer Hip Stretch: Reclined IT Band Stretch (Strap)   Strap around one foot, pull leg across body until you feel a pull or stretch, with shoulders on mat. Hold for 30 seconds. Repeat 3 times each leg. 2-3 times/day.  KNEE: Quadriceps - Prone    Place strap around ankle. Bring ankle toward buttocks. Press hip into surface. Hold _30__ seconds. _2__ reps per set, _1__ sets per day, _5-7__ days per week   [place] [facility] [place] [address], [place] [address]  [phone] (office) [phone] (fax)

## 2015-07-28 NOTE — Assessment & Plan Note (Signed)
Visible on ultrasound at the radiocarpal as well as the mid carpal joint. We will start conservatively with topical Pennsaid and wrist brace immobilization. If insufficient improvement in one week, I am happy to inject her radiocarpal joint.

## 2015-07-28 NOTE — Assessment & Plan Note (Signed)
Orthovisc injection #2 of 4 into both knees, already starting to feel a benefit.

## 2015-07-28 NOTE — Progress Notes (Signed)
  Subjective:    CC: follow-up  HPI: Left wrist pain: History of distal radial fracture, pain is at the radiocarpal joint, swollen, moderate, persistent without radiation.  Bilateral knee osteoarthritis: Here for Orthovisc injection number 4 into both knees, already feeling good response.  Past medical history, Surgical history, Family history not pertinant except as noted below, Social history, Allergies, and medications have been entered into the medical record, reviewed, and no changes needed.   Review of Systems: No fevers, chills, night sweats, weight loss, chest pain, or shortness of breath.   Objective:    General: Well Developed, well nourished, and in no acute distress.  Neuro: Alert and oriented x3, extra-ocular muscles intact, sensation grossly intact.  HEENT: Normocephalic, atraumatic, pupils equal round reactive to light, neck supple, no masses, no lymphadenopathy, thyroid nonpalpable.  Skin: Warm and dry, no rashes. Cardiac: Regular rate and rhythm, no murmurs rubs or gallops, no lower extremity edema.  Respiratory: Clear to auscultation bilaterally. Not using accessory muscles, speaking in full sentences. Left Wrist: Inspection normal with no visible erythema or swelling. ROM smooth and normal with good flexion and extension and ulnar/radial deviation that is symmetrical with opposite wrist. Tender to palpation at the radiocarpal joint as well as over the  Sixth extensor compartment No snuffbox tenderness. No tenderness over Canal of [place]. Strength 5/5 in all directions without pain. Negative Finkelstein, tinel's and phalens. Negative Watson's test.  Procedure: Real-time Ultrasound Guided Injection of left knee Device: GE Logiq E  Verbal informed consent obtained.  Time-out conducted.  Noted no overlying erythema, induration, or other signs of local infection.  Skin prepped in a sterile fashion.  Local anesthesia: Topical Ethyl chloride.  With sterile technique  and under real time ultrasound guidance:   30 mg/2 mL of OrthoVisc (sodium hyaluronate) in a prefilled syringe was injected easily into the knee through a 22-gauge needle. Completed without difficulty  Pain immediately resolved suggesting accurate placement of the medication.  Advised to call if fevers/chills, erythema, induration, drainage, or persistent bleeding.  Images permanently stored and available for review in the ultrasound unit.  Impression: Technically successful ultrasound guided injection.  Procedure: Real-time Ultrasound Guided Injection of right knee Device: GE Logiq E  Verbal informed consent obtained.  Time-out conducted.  Noted no overlying erythema, induration, or other signs of local infection.  Skin prepped in a sterile fashion.  Local anesthesia: Topical Ethyl chloride.  With sterile technique and under real time ultrasound guidance:   30 mg/2 mL of OrthoVisc (sodium hyaluronate) in a prefilled syringe was injected easily into the knee through a 22-gauge needle. Completed without difficulty  Pain immediately resolved suggesting accurate placement of the medication.  Advised to call if fevers/chills, erythema, induration, drainage, or persistent bleeding.  Images permanently stored and available for review in the ultrasound unit.  Impression: Technically successful ultrasound guided injection.  Impression and Recommendations:

## 2015-07-31 ENCOUNTER — Encounter: Payer: Managed Care, Other (non HMO) | Admitting: Physical Therapy

## 2015-08-01 ENCOUNTER — Encounter: Payer: Managed Care, Other (non HMO) | Admitting: Physical Therapy

## 2015-08-04 ENCOUNTER — Ambulatory Visit (INDEPENDENT_AMBULATORY_CARE_PROVIDER_SITE_OTHER): Payer: Managed Care, Other (non HMO) | Admitting: Sports Medicine

## 2015-08-04 ENCOUNTER — Ambulatory Visit (INDEPENDENT_AMBULATORY_CARE_PROVIDER_SITE_OTHER): Payer: Managed Care, Other (non HMO) | Admitting: Physical Therapy

## 2015-08-04 ENCOUNTER — Encounter: Payer: Self-pay | Admitting: Sports Medicine

## 2015-08-04 VITALS — BP 132/84 | HR 93 | Wt 206.0 lb

## 2015-08-04 DIAGNOSIS — M623 Immobility syndrome (paraplegic): Secondary | ICD-10-CM | POA: Diagnosis not present

## 2015-08-04 DIAGNOSIS — G43011 Migraine without aura, intractable, with status migrainosus: Secondary | ICD-10-CM

## 2015-08-04 DIAGNOSIS — R293 Abnormal posture: Secondary | ICD-10-CM | POA: Diagnosis not present

## 2015-08-04 DIAGNOSIS — R6889 Other general symptoms and signs: Secondary | ICD-10-CM

## 2015-08-04 DIAGNOSIS — R531 Weakness: Secondary | ICD-10-CM

## 2015-08-04 DIAGNOSIS — M545 Low back pain, unspecified: Secondary | ICD-10-CM

## 2015-08-04 DIAGNOSIS — R29898 Other symptoms and signs involving the musculoskeletal system: Secondary | ICD-10-CM

## 2015-08-04 DIAGNOSIS — M2560 Stiffness of unspecified joint, not elsewhere classified: Secondary | ICD-10-CM

## 2015-08-04 DIAGNOSIS — M170 Bilateral primary osteoarthritis of knee: Secondary | ICD-10-CM | POA: Diagnosis not present

## 2015-08-04 DIAGNOSIS — Z7409 Other reduced mobility: Secondary | ICD-10-CM

## 2015-08-04 MED ORDER — SUMATRIPTAN SUCCINATE 100 MG PO TABS: ORAL_TABLET | ORAL | Status: DC

## 2015-08-04 NOTE — Progress Notes (Signed)
  Procedure: Real-time Ultrasound Guided Injection of right knee Device: GE Logiq E  Verbal informed consent obtained.  Time-out conducted.  Noted no overlying erythema, induration, or other signs of local infection.  Skin prepped in a sterile fashion.  Local anesthesia: Topical Ethyl chloride.  With sterile technique and under real time ultrasound guidance: 30 mg/2 mL of OrthoVisc (sodium hyaluronate) in a prefilled syringe was injected easily into the knee through a 22-gauge needle. Completed without difficulty  Pain immediately resolved suggesting accurate placement of the medication.  Advised to call if fevers/chills, erythema, induration, drainage, or persistent bleeding.  Images permanently stored and available for review in the ultrasound unit.  Impression: Technically successful ultrasound guided injection.  Procedure: Real-time Ultrasound Guided Injection of left knee Device: GE Logiq E  Verbal informed consent obtained.  Time-out conducted.  Noted no overlying erythema, induration, or other signs of local infection.  Skin prepped in a sterile fashion.  Local anesthesia: Topical Ethyl chloride.  With sterile technique and under real time ultrasound guidance: 30 mg/2 mL of OrthoVisc (sodium hyaluronate) in a prefilled syringe was injected easily into the knee through a 22-gauge needle. Completed without difficulty  Pain immediately resolved suggesting accurate placement of the medication.  Advised to call if fevers/chills, erythema, induration, drainage, or persistent bleeding.  Images permanently stored and available for review in the ultrasound unit.  Impression: Technically successful ultrasound guided injection.  

## 2015-08-04 NOTE — Assessment & Plan Note (Signed)
Orthovisc injection #3 of 4 to both knees. Return in one week for #4.

## 2015-08-04 NOTE — Patient Instructions (Signed)
  Abdominal Bracing With Pelvic Floor (Hook-Lying)   With neutral spine, tighten pelvic floor and abdominals. Hold 10 seconds. Repeat __10_ times. Do _1__ times a day.   Knee to Chest: Transverse Plane Stability  Tighten abdominals.  Bring one knee up, then return. Be sure pelvis does not roll side to side. Keep pelvis still. Lift knee __10_ times each leg. Restabilize pelvis. Repeat with other leg. Do _1-2__ sets, _1__ times per day.   Tighten abdominals Hip External Rotation With Pillow: Transverse Plane Stability  (KEEP BOTH KNEES BENT) Slowly roll bent knee out. Be sure pelvis does not rotate. Do _10__ times. Restabilize pelvis. Repeat with other leg. Do _1-2__ sets, _1__ times per day.  Heel Slide: 4-10 Inches - Transverse Plane Stability  Tighten abdominals.  Slide heel 4 inches down. Be sure pelvis does not rotate. Do _10__ times. Restabilize pelvis. Repeat with other leg. Do __1_ sets, _1__ times per day.   STRETCHES HIP: Hamstrings - Supine   Place strap around foot. Raise leg up, keeping knee straight.  Bend opposite knee to protect back if indicated. Hold 30 seconds. 3 reps per set, 2-3 sets per day  Outer Hip Stretch: Reclined IT Band Stretch (Strap)   Strap around one foot, pull leg across body until you feel a pull or stretch, with shoulders on mat. Hold for 30 seconds. Repeat 3 times each leg. 2-3 times/day. KNEE: Quadriceps - Prone    Place strap around ankle. Bring ankle toward buttocks. Press hip into surface. Hold _30 seconds. _2_ reps per set, _1_ sets per day, _5-7days per week   [place] [facility] [place], [place] [address]  [phone] (office) [phone] (fax)

## 2015-08-04 NOTE — Therapy (Signed)
[place], [place], [address] Phone: [phone]   Fax:  [phone]  Physical Therapy Treatment  Patient Details  Name: Rose Long MRN: [id number] Date of Birth: 01-07-1961 No Data Recorded  Encounter Date: 08/04/2015      PT End of Session - 08/04/15 1609    Visit Number 2   Number of Visits 12   PT Start Time 1602   PT Stop Time 1650   PT Time Calculation (min) 48 min   Activity Tolerance Patient tolerated treatment well      Past Medical History  Diagnosis Date  . Arthritis   . Obesity   . Anemia   . Simple ovarian cyst     Right    Past Surgical History  Procedure Laterality Date  . 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 Nasal sinus surgery  1999  . Breast surgery  1998    reduction  . Uterine ablation  2012  . Under arm skin removed    . Breast surgery  10/2013    Lift  . Abdominoplasty/panniculectomy      There were no vitals filed for this visit.  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  Pt just had injections in knees prior to therapy sessions.    Currently in Pain? Yes   Pain Score 4    Pain Location Back   Pain Orientation Left;Right   Pain Descriptors / Indicators Nagging   Aggravating Factors  sitting >30 min; stairs   Pain Relieving Factors medication, shifting, moving .            [facility] PT Assessment - 08/04/15 0001    Assessment   Medical Diagnosis Lt LBP   Onset Date/Surgical Date 06/03/15   Hand Dominance Left           [facility] Adult PT Treatment/Exercise - 08/04/15 0001    Lumbar Exercises: Stretches   Passive Hamstring  Stretch 2 reps;30 seconds  supine with strap   Double Knee to Chest Stretch 2 reps;30 seconds   Lower Trunk Rotation 4 reps;20 seconds   Quad Stretch 2 reps;30 seconds  each leg, with strap   ITB Stretch 2 reps;30 seconds  supine with strap   ITB Stretch Limitations (also adductor stretch with strap)   Lumbar Exercises: Aerobic   Stationary Bike L2: 5 min    Lumbar Exercises: Supine   Clam 10 reps  with ab set, each leg   Heel Slides 10 reps  each leg with ab set   Heel Slides Limitations LLE limited due to increased pain in back with knee extended.    Bent Knee Raise 15 reps  with abd set   Modalities   Modalities Moist Heat;Electrical Stimulation   Moist Heat Therapy   Number Minutes Moist Heat 15 Minutes   Moist Heat Location Lumbar Spine   Electrical Stimulation   Electrical Stimulation Location bilateral QL   Electrical Stimulation Action IFC   Electrical Stimulation Parameters to tolerance   Electrical Stimulation Goals Pain                PT Education -  08/04/15 1642    Education provided Yes   Education Details re-issued HEP from last session and reviewed it.    Person(s) Educated Patient   Methods Handout;Explanation   Comprehension Verbalized understanding;Returned demonstration             PT Long Term Goals - 07/28/15 1705    PT LONG TERM GOAL #1   Title Patient I in HEP 09/08/15    Time 6   Period Weeks   Status New   PT LONG TERM GOAL #2   Title Patient to identify modification for aerobic exercise program to identify the least stressful mode of exercise for orthopedic conditions 09/08/15   Time 6   Period Weeks   Status New   PT LONG TERM GOAL #3   Title Improve flexibility through bilat hips and knees by 5-7 degrees 09/08/15   Time 6   Period Weeks   Status New   PT LONG TERM GOAL #4   Title Decrease intensity of pain with functional activities to 3/10 - 4/10 50-70% of day 09/08/15   Time 6   Period Weeks   Status New   PT  LONG TERM GOAL #5   Title Improve FOTO to </= 31% limitation 09/08/15   Time 6   Period Weeks   Status New               Plan - 08/04/15 1640    Clinical Impression Statement Pt tolerated all exercises without increase in LBP; required some cues to slow pace of exercises and for form. Pt reported reduction of LBP with MHP and estim, however requested to lay prone next time as she didn't tolerate hooklying well.  Progressing towards goals.    Pt will benefit from skilled therapeutic intervention in order to improve on the following deficits Postural dysfunction;Improper body mechanics;Pain;Decreased range of motion;Decreased mobility;Decreased strength;Decreased endurance;Decreased activity tolerance;Increased fascial restricitons   Rehab Potential Good   PT Frequency 2x / week   PT Duration 6 weeks   PT Treatment/Interventions Patient/family education;ADLs/Self Care Home Management;Therapeutic exercise;Therapeutic activities;Manual techniques;Dry needling;Cryotherapy;Electrical Stimulation;Moist Heat;Ultrasound   PT Next Visit Plan review HEP; progress with stretching and core stabilization as indicated - working to identify least stressful exercise program considering structural changes and postural/wt bearing limitations   Consulted and Agree with Plan of Care Patient        Problem List Patient Active Problem List   Diagnosis Date Noted  . Primary osteoarthritis of left wrist 07/28/2015  . Olecranon bursitis of right elbow 06/16/2015  . Hemorrhoid 06/12/2014  . Absolute anemia 06/12/2014  . Abnormal facial hair 08/03/2013  . Trochanteric bursitis of both hips 06/12/2013  . Low back pain 05/21/2013  . Osteoarthritis of both knees 05/04/2013  . Metatarsalgia of left foot 05/04/2013  . Obesity 02/22/2012  . Migraine without aura 11/23/2011  . Chronic daily headache 11/23/2011  . Muscle spasm 11/23/2011  . Insomnia 11/23/2011  . RHEUMATOID ARTHRITIS 08/15/2008    Kerin Perna, PTA 08/04/2015 4:54 PM  [place], [place], [address] Phone: [phone]   Fax:  [phone]  Name: ROSEZELLA KRONICK MRN: [id number] Date of Birth: [birth year]

## 2015-08-05 ENCOUNTER — Ambulatory Visit
Admission: RE | Admit: 2015-08-05 | Discharge: 2015-08-05 | Disposition: A | Discharge status: Home / Self Care | Payer: Managed Care, Other (non HMO) | Source: Ambulatory Visit | Attending: Family Medicine | Admitting: Family Medicine

## 2015-08-05 ENCOUNTER — Other Ambulatory Visit: Payer: Self-pay | Admitting: Family Medicine

## 2015-08-05 DIAGNOSIS — N6311 Unspecified lump in the right breast, upper outer quadrant: Secondary | ICD-10-CM

## 2015-08-05 DIAGNOSIS — N6322 Unspecified lump in the left breast, upper inner quadrant: Secondary | ICD-10-CM

## 2015-08-08 ENCOUNTER — Ambulatory Visit (INDEPENDENT_AMBULATORY_CARE_PROVIDER_SITE_OTHER): Payer: Managed Care, Other (non HMO) | Admitting: Physical Therapy

## 2015-08-08 DIAGNOSIS — R29898 Other symptoms and signs involving the musculoskeletal system: Secondary | ICD-10-CM

## 2015-08-08 DIAGNOSIS — M545 Low back pain, unspecified: Secondary | ICD-10-CM

## 2015-08-08 DIAGNOSIS — Z7409 Other reduced mobility: Secondary | ICD-10-CM

## 2015-08-08 DIAGNOSIS — R293 Abnormal posture: Secondary | ICD-10-CM | POA: Diagnosis not present

## 2015-08-08 DIAGNOSIS — R531 Weakness: Secondary | ICD-10-CM

## 2015-08-08 DIAGNOSIS — R6889 Other general symptoms and signs: Secondary | ICD-10-CM

## 2015-08-08 DIAGNOSIS — M623 Immobility syndrome (paraplegic): Secondary | ICD-10-CM

## 2015-08-08 DIAGNOSIS — M2560 Stiffness of unspecified joint, not elsewhere classified: Secondary | ICD-10-CM

## 2015-08-08 NOTE — Therapy (Signed)
[place], [place], [address] Phone: [phone]   Fax:  [phone]  Physical Therapy Treatment  Patient Details  Name: Rose Long MRN: [id number] Date of Birth: 10/16/1961 No Data Recorded  Encounter Date: 08/08/2015      PT End of Session - 08/08/15 1450    Visit Number 3   Number of Visits 12   Date for PT Re-Evaluation 09/08/15   PT Start Time 3662   PT Stop Time 1552   PT Time Calculation (min) 65 min   Activity Tolerance Patient tolerated treatment well      Past Medical History  Diagnosis Date  . Arthritis   . Obesity   . Anemia   . Simple ovarian cyst     Right    Past Surgical History  Procedure Laterality Date  . 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 Nasal sinus surgery  1999  . Breast surgery  1998    reduction  . Uterine ablation  2012  . Under arm skin removed    . Breast surgery  10/2013    Lift  . Abdominoplasty/panniculectomy      There were no vitals filed for this visit.  Visit Diagnosis:  Left-sided low back pain without sciatica  Abnormal posture  Stiffness due to immobility  Weakness of left leg  Decreased strength, endurance, and mobility      Subjective Assessment - 08/08/15 1450    Subjective Pt reports no changes since last visit.  Pt reports her back was painful remainder of night after therapy, but calmed down by next day.    Currently in Pain? Yes   Pain Score 4    Pain Location Back   Pain Orientation Left   Pain Descriptors / Indicators Nagging   Aggravating Factors  sitting any length of time, stairs    Pain Relieving Factors moving around, shifting, medication             [facility] PT Assessment - 08/08/15 0001    Assessment   Medical Diagnosis Lt LBP   Onset Date/Surgical Date 06/03/15   Hand Dominance Left   Next MD Visit not scheduled.            [facility] Adult PT Treatment/Exercise - 08/08/15 0001    Self-Care   Self-Care Other Self-Care Comments   Other Self-Care Comments  Pt instructed in techniques for self MFR to neck, and with ball to bottom of foot and lateral Lt thigh. Pt able to return demo of each technique. Some tactile cues required.    Exercises   Exercises Lumbar;Knee/Hip   Lumbar Exercises: Stretches   Passive Hamstring Stretch 2 reps;30 seconds  supine with strap, LLE   Double Knee to Chest Stretch 2 reps;20 seconds   Lower Trunk Rotation 5 reps   ITB Stretch 2 reps;30 seconds  supine with strap, LLE   Lumbar Exercises: Supine   Clam 10 reps  with ab set, each leg   Heel Slides 10 reps  each leg with ab set   Heel Slides Limitations (no pain in LLE)   Bent Knee Raise 15 reps  with abd set   Lumbar Exercises: Sidelying   Other Sidelying Lumbar Exercises Rt sidelying stretch to QL over bolster x 2 min  (arm over head)    Other Sidelying Lumbar Exercises MFR with yellow ball to tender areas along lateral Lt thigh/hip    Knee/Hip Exercises: Stretches   Gastroc Stretch Left;2 reps;60  seconds   Knee/Hip Exercises: Aerobic   Stationary Bike L2: 6 min    Knee/Hip Exercises: Seated   Other Seated Knee/Hip Exercises Ball roll to Lt foot plantar fascia to decrease restrictions in Lt LE connective tissue. 2 min    Modalities   Modalities Cryotherapy;Electrical Stimulation   Cryotherapy   Number Minutes Cryotherapy 15 Minutes   Cryotherapy Location Hip  Lt   Type of Cryotherapy Ice pack   Electrical Stimulation   Electrical Stimulation Location Lt lateral / posterior hip    Electrical Stimulation Action IFC   Electrical Stimulation Parameters to tolerance    Electrical Stimulation Goals Pain   Manual Therapy   Manual Therapy Soft tissue mobilization;Myofascial release   Soft tissue mobilization to Lt ITB    Myofascial Release to Rt/Lt psoas/ TFL on Lt.                 PT Education - 08/08/15 1606    Education provided Yes   Education Details Information on TENS  unit; HEP - pt to add MFR with ball to Lt hip, foot and use ice pack to affected area to reduce pain.    Person(s) Educated Patient   Methods Explanation;Handout   Comprehension Verbalized understanding             PT Long Term Goals - 07/28/15 1705    PT LONG TERM GOAL #1   Title Patient I in HEP 09/08/15    Time 6   Period Weeks   Status New   PT LONG TERM GOAL #2   Title Patient to identify modification for aerobic exercise program to identify the least stressful mode of exercise for orthopedic conditions 09/08/15   Time 6   Period Weeks   Status New   PT LONG TERM GOAL #3   Title Improve flexibility through bilat hips and knees by 5-7 degrees 09/08/15   Time 6   Period Weeks   Status New   PT LONG TERM GOAL #4   Title Decrease intensity of pain with functional activities to 3/10 - 4/10 50-70% of day 09/08/15   Time 6   Period Weeks   Status New   PT LONG TERM GOAL #5   Title Improve FOTO to </= 31% limitation 09/08/15   Time 6   Period Weeks   Status New               Plan - 08/08/15 1603    Clinical Impression Statement Pt demonstrated improved transverse abdominal engagement with improved pace with exercise this session.  Pt point tender along lateral border of proximal Lt thigh/hip with manual therapy; some improvement in pain afterwards.  Pt reported elimination of pain in Lt hip/ LB after icepack/estim in prone.  Progressing towards goals.    Pt will benefit from skilled therapeutic intervention in order to improve on the following deficits Postural dysfunction;Improper body mechanics;Pain;Decreased range of motion;Decreased mobility;Decreased strength;Decreased endurance;Decreased activity tolerance;Increased fascial restricitons   Rehab Potential Good   PT Frequency 2x / week   PT Duration 6 weeks   PT Treatment/Interventions Patient/family education;ADLs/Self Care Home Management;Therapeutic exercise;Therapeutic activities;Manual techniques;Dry  needling;Cryotherapy;Electrical Stimulation;Moist Heat;Ultrasound   PT Next Visit Plan Continue progressive core strengthening/  LE stretches.  Ice/estim for pain relief as needed.         Problem List Patient Active Problem List   Diagnosis Date Noted  . Primary osteoarthritis of left wrist 07/28/2015  . Olecranon bursitis of right elbow 06/16/2015  .  Hemorrhoid 06/12/2014  . Absolute anemia 06/12/2014  . Abnormal facial hair 08/03/2013  . Trochanteric bursitis of both hips 06/12/2013  . Low back pain 05/21/2013  . Osteoarthritis of both knees 05/04/2013  . Metatarsalgia of left foot 05/04/2013  . Obesity 02/22/2012  . Migraine without aura 11/23/2011  . Chronic daily headache 11/23/2011  . Muscle spasm 11/23/2011  . Insomnia 11/23/2011  . RHEUMATOID ARTHRITIS 08/15/2008   Kerin Perna, PTA 08/08/2015 4:15 PM  [facility] [place], [place], [address] Phone: [phone]   Fax:  [phone]  Name: Rose Long MRN: [id number] Date of Birth: 01-08-1961

## 2015-08-11 ENCOUNTER — Ambulatory Visit (INDEPENDENT_AMBULATORY_CARE_PROVIDER_SITE_OTHER): Payer: Managed Care, Other (non HMO) | Admitting: Sports Medicine

## 2015-08-11 ENCOUNTER — Encounter: Payer: Self-pay | Admitting: Sports Medicine

## 2015-08-11 VITALS — BP 119/71 | HR 61 | Wt 209.0 lb

## 2015-08-11 DIAGNOSIS — M170 Bilateral primary osteoarthritis of knee: Secondary | ICD-10-CM

## 2015-08-11 DIAGNOSIS — G43011 Migraine without aura, intractable, with status migrainosus: Secondary | ICD-10-CM

## 2015-08-11 NOTE — Progress Notes (Signed)
    Procedure: Real-time Ultrasound Guided Injection of left knee Device: GE Logiq E  Verbal informed consent obtained.  Time-out conducted.  Noted no overlying erythema, induration, or other signs of local infection.  Skin prepped in a sterile fashion.  Local anesthesia: Topical Ethyl chloride.  With sterile technique and under real time ultrasound guidance:   60 mg/4 mL of OrthoVisc (sodium hyaluronate) in a prefilled syringe was injected easily into the knee through a 22-gauge needle. Completed without difficulty  Pain immediately resolved suggesting accurate placement of the medication.  Advised to call if fevers/chills, erythema, induration, drainage, or persistent bleeding.  Images permanently stored and available for review in the ultrasound unit.  Impression: Technically successful ultrasound guided injection.

## 2015-08-11 NOTE — Assessment & Plan Note (Addendum)
Right knee is doing well after 3 Orthovisc injections, we placed the last 2 injections into the left knee, a double dose, return in one month. She is bone-on-bone on the left side, particularly lateral compartment so I do not anticipate any significant improvement. I am going to go ahead and do a referral to orthopedic surgery, she will most likely need a total knee arthroplasty on the left.

## 2015-08-11 NOTE — Assessment & Plan Note (Signed)
Refilling Imitrex, #9.

## 2015-08-13 ENCOUNTER — Ambulatory Visit (INDEPENDENT_AMBULATORY_CARE_PROVIDER_SITE_OTHER): Payer: Managed Care, Other (non HMO) | Admitting: Physical Therapy

## 2015-08-13 DIAGNOSIS — R6889 Other general symptoms and signs: Secondary | ICD-10-CM

## 2015-08-13 DIAGNOSIS — R29898 Other symptoms and signs involving the musculoskeletal system: Secondary | ICD-10-CM

## 2015-08-13 DIAGNOSIS — M623 Immobility syndrome (paraplegic): Secondary | ICD-10-CM

## 2015-08-13 DIAGNOSIS — M545 Low back pain, unspecified: Secondary | ICD-10-CM

## 2015-08-13 DIAGNOSIS — R531 Weakness: Secondary | ICD-10-CM

## 2015-08-13 DIAGNOSIS — Z7409 Other reduced mobility: Secondary | ICD-10-CM

## 2015-08-13 DIAGNOSIS — R293 Abnormal posture: Secondary | ICD-10-CM

## 2015-08-13 DIAGNOSIS — M2560 Stiffness of unspecified joint, not elsewhere classified: Secondary | ICD-10-CM

## 2015-08-13 NOTE — Therapy (Signed)
[place], [place], [address] Phone: [phone]   Fax:  [phone]  Physical Therapy Treatment  Patient Details  Name: Rose Long MRN: [id number] Date of Birth: 1961/08/14 No Data Recorded  Encounter Date: 08/13/2015      PT End of Session - 08/13/15 1657    Visit Number 4   Number of Visits 12   Date for PT Re-Evaluation 09/08/15   PT Start Time 8341   PT Stop Time 1708   PT Time Calculation (min) 64 min      Past Medical History  Diagnosis Date  . Arthritis   . Obesity   . Anemia   . Simple ovarian cyst     Right    Past Surgical History  Procedure Laterality Date  . 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 Nasal sinus surgery  1999  . Breast surgery  1998    reduction  . Uterine ablation  2012  . Under arm skin removed    . Breast surgery  10/2013    Lift  . Abdominoplasty/panniculectomy      There were no vitals filed for this visit.  Visit Diagnosis:  Left-sided low back pain without sciatica  Abnormal posture  Stiffness due to immobility  Weakness of left leg  Decreased strength, endurance, and mobility      Subjective Assessment - 08/13/15 1611    Subjective Pt reports relief Lt hip/ back from last session lasted remainder of day. Has body aches today, but otherwise feeling "ok"    Currently in Pain? No/denies            [facility] PT Assessment - 08/13/15 0001    Assessment   Medical Diagnosis Lt LBP   Onset Date/Surgical Date 06/03/15   Hand Dominance Left   Strength   Right/Left Hip Left   Left Hip Flexion --  5-/5, with pain in knee   Left Hip Extension 4/5   Left Hip ABduction 4+/5            [facility] Adult PT Treatment/Exercise - 08/13/15 0001    Lumbar Exercises: Stretches   Double Knee to Chest Stretch 1 rep;30 seconds   Lower Trunk Rotation 4 reps;20 seconds   Lumbar Exercises: Aerobic   Stationary Bike NuStep L5: 5 min    Lumbar  Exercises: Supine   Other Supine Lumbar Exercises MFR with ball to Lt piriformis   Lumbar Exercises: Prone   Straight Leg Raise 10 reps  end of reps, c/o back pain.    Other Prone Lumbar Exercises Tried pelvic presses; unable to engage without glutes.  Increased back pain with attempts; stopped.    Other Prone Lumbar Exercises MFR to Lt psoas 1 min to each tender area.    Knee/Hip Exercises: Stretches   Gastroc Stretch Right;Left;2 reps;30 seconds   Modalities   Modalities Cryotherapy;Electrical Stimulation   Cryotherapy   Number Minutes Cryotherapy 15 Minutes   Cryotherapy Location Hip  Lt   Type of Cryotherapy Ice pack   Electrical Stimulation   Electrical Stimulation Location Lt lateral / posterior hip    Electrical Stimulation Action IFC   Electrical Stimulation Parameters to tolerance   Electrical Stimulation Goals Pain   Manual Therapy   Manual Therapy Soft tissue mobilization;Myofascial release   Soft tissue mobilization Edge tool assistance to Lt ITB and midto lateral quad.    Myofascial Release to Lt psoas, Lt ITB, TFL, Lt piriformis (very point tender  PT Education - 08/13/15 1658    Education provided Yes   Education Details Pt to add MFR ball work to piriformis and ant hip.    Person(s) Educated Patient   Methods Explanation   Comprehension Verbalized understanding             PT Long Term Goals - 07/28/15 1705    PT LONG TERM GOAL #1   Title Patient I in HEP 09/08/15    Time 6   Period Weeks   Status New   PT LONG TERM GOAL #2   Title Patient to identify modification for aerobic exercise program to identify the least stressful mode of exercise for orthopedic conditions 09/08/15   Time 6   Period Weeks   Status New   PT LONG TERM GOAL #3   Title Improve flexibility through bilat hips and knees by 5-7 degrees 09/08/15   Time 6   Period Weeks   Status New   PT LONG TERM GOAL #4   Title Decrease intensity of pain with  functional activities to 3/10 - 4/10 50-70% of day 09/08/15   Time 6   Period Weeks   Status New   PT LONG TERM GOAL #5   Title Improve FOTO to </= 31% limitation 09/08/15   Time 6   Period Weeks   Status New               Plan - 08/13/15 1658    Clinical Impression Statement Pt demonstrated improved Lt hip strength. Pt point tender along Lt lateral hip, psoas with manual therapy. Overall, symptoms decreasing. Progressing towards goals.    Pt will benefit from skilled therapeutic intervention in order to improve on the following deficits Postural dysfunction;Improper body mechanics;Pain;Decreased range of motion;Decreased mobility;Decreased strength;Decreased endurance;Decreased activity tolerance;Increased fascial restricitons   Rehab Potential Good   PT Frequency 2x / week   PT Duration 6 weeks   PT Treatment/Interventions Patient/family education;ADLs/Self Care Home Management;Therapeutic exercise;Therapeutic activities;Manual techniques;Dry needling;Cryotherapy;Electrical Stimulation;Moist Heat;Ultrasound   PT Next Visit Plan Continue progressive core strengthening/  LE stretches.  Ice/estim for pain relief as needed.    Consulted and Agree with Plan of Care Patient        Problem List Patient Active Problem List   Diagnosis Date Noted  . Primary osteoarthritis of left wrist 07/28/2015  . Olecranon bursitis of right elbow 06/16/2015  . Hemorrhoid 06/12/2014  . Absolute anemia 06/12/2014  . Abnormal facial hair 08/03/2013  . Trochanteric bursitis of both hips 06/12/2013  . Low back pain 05/21/2013  . Osteoarthritis of both knees 05/04/2013  . Metatarsalgia of left foot 05/04/2013  . Obesity 02/22/2012  . Migraine without aura 11/23/2011  . Chronic daily headache 11/23/2011  . Muscle spasm 11/23/2011  . Insomnia 11/23/2011  . RHEUMATOID ARTHRITIS 08/15/2008    Kerin Perna, PTA 08/13/2015 5:08 PM  [place], [place], [address] Phone: [phone]   Fax:  [phone]  Name: LUCYNDA ROSANO MRN: [id number] Date of Birth: 1961/07/24

## 2015-08-18 ENCOUNTER — Ambulatory Visit (INDEPENDENT_AMBULATORY_CARE_PROVIDER_SITE_OTHER): Payer: Managed Care, Other (non HMO) | Admitting: Physical Therapy

## 2015-08-18 DIAGNOSIS — M545 Low back pain, unspecified: Secondary | ICD-10-CM

## 2015-08-18 DIAGNOSIS — R29898 Other symptoms and signs involving the musculoskeletal system: Secondary | ICD-10-CM | POA: Diagnosis not present

## 2015-08-18 DIAGNOSIS — Z7409 Other reduced mobility: Secondary | ICD-10-CM

## 2015-08-18 DIAGNOSIS — R293 Abnormal posture: Secondary | ICD-10-CM

## 2015-08-18 DIAGNOSIS — M623 Immobility syndrome (paraplegic): Secondary | ICD-10-CM

## 2015-08-18 DIAGNOSIS — M2560 Stiffness of unspecified joint, not elsewhere classified: Secondary | ICD-10-CM

## 2015-08-18 DIAGNOSIS — R531 Weakness: Secondary | ICD-10-CM

## 2015-08-18 NOTE — Therapy (Signed)
[facility] [place], [place], [address] Phone: [phone]   Fax:  [phone]  Physical Therapy Treatment  Patient Details  Name: Rose Long MRN: [id number] Date of Birth: 1960-12-10 No Data Recorded  Encounter Date: 08/18/2015      PT End of Session - 08/18/15 1436    Visit Number 5   Number of Visits 12   Date for PT Re-Evaluation 09/08/15   PT Start Time 5176   PT Stop Time 1525   PT Time Calculation (min) 54 min      Past Medical History  Diagnosis Date  . Arthritis   . Obesity   . Anemia   . Simple ovarian cyst     Right    Past Surgical History  Procedure Laterality Date  . 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 Nasal sinus surgery  1999  . Breast surgery  1998    reduction  . Uterine ablation  2012  . Under arm skin removed    . Breast surgery  10/2013    Lift  . Abdominoplasty/panniculectomy      There were no vitals filed for this visit.  Visit Diagnosis:  Left-sided low back pain without sciatica  Abnormal posture  Stiffness due to immobility  Weakness of left leg  Decreased strength, endurance, and mobility      Subjective Assessment - 08/18/15 1437    Subjective Pt reports she had increased Lt hip/back pain 4 days ago, but believes it was from over exertion with stairs and cleaning house. Was able to reduce pain with rest and stretches.    Currently in Pain? No/denies            [facility] PT Assessment - 08/18/15 0001    Assessment   Medical Diagnosis Lt LBP   Onset Date/Surgical Date 06/03/15   Hand Dominance Left          [facility] Adult PT Treatment/Exercise - 08/18/15 0001    Lumbar Exercises: Stretches   Passive Hamstring Stretch 2 reps;30 seconds  seated, upright posture.    Single Knee to Chest Stretch 2 reps;30 seconds  each side    Single Knee to Chest Stretch Limitations stopped due to increased LBP.    Lower Trunk Rotation 20 seconds;3 reps   ITB  Stretch 2 reps;20 seconds   ITB Stretch Limitations supine with strap, VC for form   Piriformis Stretch 1 rep;60 seconds  each side, seated   Lumbar Exercises: Aerobic   Stationary Bike NuStep L5: 5 min    Lumbar Exercises: Seated   Hip Flexion on Ball 10 reps  then 10 reps with opp arm   Hip Flexion on Ball Limitations on blue disc, then red ball.  Mirror for visual feedback.  Noted shortening of Rt lumbar/waist with Lt hip flexion; reduced when pt stabilized with LUE.   Pelvic tilts side/side, front/back while sitting on blue disc.    Lumbar Exercises: Prone   Opposite Arm/Leg Raise 5 reps;Right arm/Left leg;Left arm/Right leg;2 seconds   Other Prone Lumbar Exercises Pelvic presses with 3 sec hold x 5 reps   Other Prone Lumbar Exercises MFR with purple ball to Lt psoas 1 min to each tender area.    Knee/Hip Exercises: Seated   Other Seated Knee/Hip Exercises seated, modified childs pose with arms on Red therapy ball, rolling out to Lt, then Rt.  Pt reported pain in Rt thoracic with Rt trunk flexion- reduced with Left lateral trunk flexion.  Modalities   Modalities Cryotherapy;Electrical Stimulation   Cryotherapy   Number Minutes Cryotherapy 15 Minutes   Cryotherapy Location Lumbar Spine  thoracic    Type of Cryotherapy Ice pack   Electrical Stimulation   Electrical Stimulation Location bilat rhomboid and bilat SI joint   Electrical Stimulation Action IFC   Electrical Stimulation Parameters to tolerance   Electrical Stimulation Goals Pain                     PT Long Term Goals - 07/28/15 1705    PT LONG TERM GOAL #1   Title Patient I in HEP 09/08/15    Time 6   Period Weeks   Status New   PT LONG TERM GOAL #2   Title Patient to identify modification for aerobic exercise program to identify the least stressful mode of exercise for orthopedic conditions 09/08/15   Time 6   Period Weeks   Status New   PT LONG TERM GOAL #3   Title Improve flexibility through  bilat hips and knees by 5-7 degrees 09/08/15   Time 6   Period Weeks   Status New   PT LONG TERM GOAL #4   Title Decrease intensity of pain with functional activities to 3/10 - 4/10 50-70% of day 09/08/15   Time 6   Period Weeks   Status New   PT LONG TERM GOAL #5   Title Improve FOTO to </= 31% limitation 09/08/15   Time 6   Period Weeks   Status New               Plan - 08/18/15 1517    Clinical Impression Statement Pt reported increased LBP with any supine ther ex.  Substitution noted in Lt LB with Lt hip flexion in seated position. Pt tolerated prone and seated exercises without increased pain.  Pt reported reduction in symptoms with use of estim and ice at end of session.    Pt will benefit from skilled therapeutic intervention in order to improve on the following deficits Postural dysfunction;Improper body mechanics;Pain;Decreased range of motion;Decreased mobility;Decreased strength;Decreased endurance;Decreased activity tolerance;Increased fascial restricitons   Rehab Potential Good   PT Frequency 2x / week   PT Duration 6 weeks   PT Treatment/Interventions Patient/family education;ADLs/Self Care Home Management;Therapeutic exercise;Therapeutic activities;Manual techniques;Dry needling;Cryotherapy;Electrical Stimulation;Moist Heat;Ultrasound   PT Next Visit Plan Continue progressive core strengthening/  LE stretches.  Ice/estim for pain relief as needed.    Consulted and Agree with Plan of Care Patient        Problem List Patient Active Problem List   Diagnosis Date Noted  . Primary osteoarthritis of left wrist 07/28/2015  . Olecranon bursitis of right elbow 06/16/2015  . Hemorrhoid 06/12/2014  . Absolute anemia 06/12/2014  . Abnormal facial hair 08/03/2013  . Trochanteric bursitis of both hips 06/12/2013  . Low back pain 05/21/2013  . Osteoarthritis of both knees 05/04/2013  . Metatarsalgia of left foot 05/04/2013  . Obesity 02/22/2012  . Migraine without  aura 11/23/2011  . Chronic daily headache 11/23/2011  . Muscle spasm 11/23/2011  . Insomnia 11/23/2011  . RHEUMATOID ARTHRITIS 08/15/2008    Kerin Perna, PTA 08/18/2015 3:37 PM  [facility] [place], [place], [address] Phone: [phone]   Fax:  [phone]  Name: Rose Long MRN: [id number] Date of Birth: 1960/10/22

## 2015-08-22 ENCOUNTER — Ambulatory Visit (INDEPENDENT_AMBULATORY_CARE_PROVIDER_SITE_OTHER): Payer: Managed Care, Other (non HMO) | Admitting: Rehabilitative and Restorative Service Providers"

## 2015-08-22 DIAGNOSIS — M623 Immobility syndrome (paraplegic): Secondary | ICD-10-CM | POA: Diagnosis not present

## 2015-08-22 DIAGNOSIS — R293 Abnormal posture: Secondary | ICD-10-CM | POA: Diagnosis not present

## 2015-08-22 DIAGNOSIS — Z7409 Other reduced mobility: Secondary | ICD-10-CM

## 2015-08-22 DIAGNOSIS — R531 Weakness: Secondary | ICD-10-CM

## 2015-08-22 DIAGNOSIS — M2560 Stiffness of unspecified joint, not elsewhere classified: Secondary | ICD-10-CM

## 2015-08-22 DIAGNOSIS — M545 Low back pain, unspecified: Secondary | ICD-10-CM

## 2015-08-22 DIAGNOSIS — R29898 Other symptoms and signs involving the musculoskeletal system: Secondary | ICD-10-CM

## 2015-08-22 NOTE — Patient Instructions (Signed)
Quads / HF, Supine    Lie near edge of bed, pull right knee to chest and hold, lower left leg over edge, relaxed. Bend hanging knee backward keeping thigh in contact with bed.  Hold _30__ seconds.  Repeat __3_ times per session. Do _2-3__ sessions per day.

## 2015-08-22 NOTE — Therapy (Signed)
[place], [place], [address] Phone: [phone]   Fax:  [phone]  Physical Therapy Treatment  Patient Details  Name: Rose Long MRN: [id number] Date of Birth: November 14, 1960 No Data Recorded  Encounter Date: 08/22/2015      PT End of Session - 08/22/15 1618    Visit Number 6   Number of Visits 12   Date for PT Re-Evaluation 09/08/15   PT Start Time 2482   PT Stop Time 1628   PT Time Calculation (min) 55 min   Activity Tolerance Patient tolerated treatment well      Past Medical History  Diagnosis Date  . Arthritis   . Obesity   . Anemia   . Simple ovarian cyst     Right    Past Surgical History  Procedure Laterality Date  . 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 Nasal sinus surgery  1999  . Breast surgery  1998    reduction  . Uterine ablation  2012  . Under arm skin removed    . Breast surgery  10/2013    Lift  . Abdominoplasty/panniculectomy      There were no vitals filed for this visit.  Visit Diagnosis:  Left-sided low back pain without sciatica  Abnormal posture  Stiffness due to immobility  Weakness of left leg  Decreased strength, endurance, and mobility      Subjective Assessment - 08/22/15 1530    Subjective Some relief with treatment and HEP = continues to have have pain in the Lt back and hip, more in the hip. Sees orthopedic surgeon Monday 08/25/15.   Currently in Pain? Yes   Pain Score 3    Pain Location Hip   Pain Orientation Left   Pain Descriptors / Indicators Nagging   Pain Type Chronic pain   Pain Radiating Towards into the Lt lateral thigh knee    Pain Onset More than a month ago   Pain Frequency Intermittent                         [facility] Adult PT Treatment/Exercise - 08/22/15 0001    Lumbar Exercises: Stretches   Passive Hamstring Stretch 2 reps;30 seconds  seated, upright posture.    ITB Stretch 2 reps;20 seconds   ITB  Stretch Limitations supine with strap, VC for form   Piriformis Stretch 1 rep;60 seconds  each side, seated   Lumbar Exercises: Aerobic   Stationary Bike NuStep L5: 5 min    Lumbar Exercises: Prone   Other Prone Lumbar Exercises Pelvic presses with 3 sec hold x 5 reps   Knee/Hip Exercises: Stretches   Hip Flexor Stretch Limitations Lt supine 60 sec hold x1   Modalities   Modalities Cryotherapy;Electrical Stimulation   Cryotherapy   Number Minutes Cryotherapy 15 Minutes   Cryotherapy Location Lumbar Spine  thoracic    Type of Cryotherapy Ice pack   Electrical Stimulation   Electrical Stimulation Location bilat rhomboid and bilat SI joint   Electrical Stimulation Action IFC   Electrical Stimulation Parameters to tolerance   Electrical Stimulation Goals Pain   Manual Therapy   Manual Therapy Soft tissue mobilization;Myofascial release   Soft tissue mobilization working through the Lt hip - posterior/lateral/anterior - Psoas; IT band; piriformis/hip abductors; SI; QL                 PT Education - 08/22/15 1613    Education provided Yes  Education Details cont myofacila ball release work adding psoas in prone; cont HEP added hip flexor stretch; add TENS unit    Person(s) Educated Patient   Methods Explanation;Demonstration;Tactile cues;Verbal cues;Handout   Comprehension Verbalized understanding;Returned demonstration;Verbal cues required;Tactile cues required             PT Long Term Goals - 08/22/15 1623    PT LONG TERM GOAL #1   Title Patient I in HEP 09/08/15    Time 6   Period Weeks   Status On-going   PT LONG TERM GOAL #2   Title Patient to identify modification for aerobic exercise program to identify the least stressful mode of exercise for orthopedic conditions 09/08/15   Time 6   Period Weeks   Status On-going   PT LONG TERM GOAL #3   Title Improve flexibility through bilat hips and knees by 5-7 degrees 09/08/15   Time 6   Period Weeks   Status  On-going   PT LONG TERM GOAL #4   Title Decrease intensity of pain with functional activities to 3/10 - 4/10 50-70% of day 09/08/15   Time 6   Period Weeks   Status On-going   PT LONG TERM GOAL #5   Title Improve FOTO to </= 31% limitation 09/08/15   Time 6   Period Weeks   Status On-going               Plan - 08/22/15 1619    Clinical Impression Statement Some improvement with manual work/myofacial ball release work and HEP, Discussed trial of dry needling and pt would like to try work through the hip area front and back. Very tight through psoas/hip flexors/sartorisis/piriformis/IT band/ hip abductors.    Pt will benefit from skilled therapeutic intervention in order to improve on the following deficits Postural dysfunction;Improper body mechanics;Pain;Decreased range of motion;Decreased mobility;Decreased strength;Decreased endurance;Decreased activity tolerance;Increased fascial restricitons   Rehab Potential Good   PT Frequency 2x / week   PT Duration 6 weeks   PT Treatment/Interventions Patient/family education;ADLs/Self Care Home Management;Therapeutic exercise;Therapeutic activities;Manual techniques;Dry needling;Cryotherapy;Electrical Stimulation;Moist Heat;Ultrasound   PT Next Visit Plan Continue progressive core strengthening/  LE stretches.  Ice/estim for pain relief as needed.    PT Home Exercise Plan discussion of need for less stressful HEP(consider water exercise program); ergonomic suggestions/recommendations; HEP stretching and core stabilization; body mechanics   Consulted and Agree with Plan of Care Patient        Problem List Patient Active Problem List   Diagnosis Date Noted  . Primary osteoarthritis of left wrist 07/28/2015  . Olecranon bursitis of right elbow 06/16/2015  . Hemorrhoid 06/12/2014  . Absolute anemia 06/12/2014  . Abnormal facial hair 08/03/2013  . Trochanteric bursitis of both hips 06/12/2013  . Low back pain 05/21/2013  .  Osteoarthritis of both knees 05/04/2013  . Metatarsalgia of left foot 05/04/2013  . Obesity 02/22/2012  . Migraine without aura 11/23/2011  . Chronic daily headache 11/23/2011  . Muscle spasm 11/23/2011  . Insomnia 11/23/2011  . RHEUMATOID ARTHRITIS 08/15/2008    Kapono Luhn Nilda Simmer PT,MPH 08/22/2015, 4:24 PM  [facility] [place], [place], [address] Phone: [phone]   Fax:  [phone]  Name: Rose Long MRN: [id number] Date of Birth: 1961-08-31

## 2015-08-25 ENCOUNTER — Encounter: Payer: Managed Care, Other (non HMO) | Admitting: Rehabilitative and Restorative Service Providers"

## 2015-08-26 ENCOUNTER — Encounter: Payer: Managed Care, Other (non HMO) | Admitting: Physical Therapy

## 2015-08-28 ENCOUNTER — Ambulatory Visit (INDEPENDENT_AMBULATORY_CARE_PROVIDER_SITE_OTHER): Payer: Managed Care, Other (non HMO) | Admitting: Physical Therapy

## 2015-08-28 DIAGNOSIS — M623 Immobility syndrome (paraplegic): Secondary | ICD-10-CM

## 2015-08-28 DIAGNOSIS — R293 Abnormal posture: Secondary | ICD-10-CM | POA: Diagnosis not present

## 2015-08-28 DIAGNOSIS — R29898 Other symptoms and signs involving the musculoskeletal system: Secondary | ICD-10-CM

## 2015-08-28 DIAGNOSIS — M545 Low back pain, unspecified: Secondary | ICD-10-CM

## 2015-08-28 DIAGNOSIS — M2560 Stiffness of unspecified joint, not elsewhere classified: Secondary | ICD-10-CM

## 2015-08-28 DIAGNOSIS — Z7409 Other reduced mobility: Secondary | ICD-10-CM

## 2015-08-28 DIAGNOSIS — R531 Weakness: Secondary | ICD-10-CM

## 2015-08-28 NOTE — Therapy (Signed)
[place], [place], [address] Phone: [phone]   Fax:  [phone]  Physical Therapy Treatment  Patient Details  Name: Rose Long MRN: [id number] Date of Birth: [birth year] No Data Recorded  Encounter Date: 08/28/2015      PT End of Session - 08/28/15 1623    Visit Number 7   Number of Visits 12   Date for PT Re-Evaluation 09/08/15   PT Start Time 9373   PT Stop Time 1652   PT Time Calculation (min) 45 min   Activity Tolerance Patient tolerated treatment well;No increased pain      Past Medical History  Diagnosis Date  . Arthritis   . Obesity   . Anemia   . Simple ovarian cyst     Right    Past Surgical History  Procedure Laterality Date  . 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 Nasal sinus surgery  1999  . Breast surgery  1998    reduction  . Uterine ablation  2012  . Under arm skin removed    . Breast surgery  10/2013    Lift  . Abdominoplasty/panniculectomy      There were no vitals filed for this visit.  Visit Diagnosis:  Left-sided low back pain without sciatica  Abnormal posture  Stiffness due to immobility  Weakness of left leg  Decreased strength, endurance, and mobility      Subjective Assessment - 08/28/15 1623    Subjective Pt reports she has started walking program every other day (2-3 miles).  Lt hip and back have calmed down.     Currently in Pain? Yes   Pain Score 3   up to 8/10 with use of NuStep   Pain Location Knee   Pain Orientation Left   Pain Descriptors / Indicators Aching;Nagging            [facility] PT Assessment - 08/28/15 0001    Assessment   Medical Diagnosis Lt LBP   Onset Date/Surgical Date 06/03/15   Hand Dominance Left   Observation/Other Assessments   Focus on Therapeutic Outcomes (FOTO)  24% limited   AROM   Right Hip Flexion 111   Left Hip Extension 5  sidelying   Left Hip Flexion 110  supine   Right/Left Knee  Right;Left   Right Knee Extension -1   Right Knee Flexion 125   Left Knee Extension -6   Left Knee Flexion 105  with pain           [facility] Adult PT Treatment/Exercise - 08/28/15 0001    Lumbar Exercises: Aerobic   Stationary Bike NuStep L4: 10 min    Lumbar Exercises: Prone   Other Prone Lumbar Exercises MFR with purple ball to Lt psoas 1 min to each tender area.    Knee/Hip Exercises: Stretches   Passive Hamstring Stretch 3 reps;Right;Left;30 seconds   Passive Hamstring Stretch Limitations with strap supine, then adductor stretch and ITB stretch; each leg   Modalities   Modalities --  declined due to 0/10 pain in LB at end of session   Manual Therapy   Manual Therapy Myofascial release   Myofascial Release to QL and paraspinals bilat (move time spent on Lt), piriformis Rt/Lt            PT Long Term Goals - 08/28/15 1656    PT LONG TERM GOAL #1   Title Patient I in HEP 09/08/15    Time 6   Period Weeks  Status Achieved   PT LONG TERM GOAL #2   Title Patient to identify modification for aerobic exercise program to identify the least stressful mode of exercise for orthopedic conditions 09/08/15   Time 6   Period Weeks   Status Achieved   PT LONG TERM GOAL #3   Title Improve flexibility through bilat hips and knees by 5-7 degrees 09/08/15   Time 6   Period Weeks   Status Partially Met   PT LONG TERM GOAL #4   Title Decrease intensity of pain with functional activities to 3/10 - 4/10 50-70% of day 09/08/15   Time 6   Period Weeks   Status Achieved   PT LONG TERM GOAL #5   Title Improve FOTO to </= 31% limitation 09/08/15   Time 6   Period Weeks   Status Achieved               Plan - 08/28/15 1656    Clinical Impression Statement Pt demonstrated improved hip and knee ROM.  Pt has not had back pain in over a wk and has started exercise program. Pt has partially met goals but feels satisfied with current level of function; requests to d/c at this time.     Pt will benefit from skilled therapeutic intervention in order to improve on the following deficits Postural dysfunction;Improper body mechanics;Pain;Decreased range of motion;Decreased mobility;Decreased strength;Decreased endurance;Decreased activity tolerance;Increased fascial restricitons   Rehab Potential Good   PT Frequency 2x / week   PT Duration 6 weeks   PT Treatment/Interventions Patient/family education;ADLs/Self Care Home Management;Therapeutic exercise;Therapeutic activities;Manual techniques;Dry needling;Cryotherapy;Electrical Stimulation;Moist Heat;Ultrasound   PT Next Visit Plan Spoke to supervising PT, will d/c to HEP.         Problem List Patient Active Problem List   Diagnosis Date Noted  . Primary osteoarthritis of left wrist 07/28/2015  . Olecranon bursitis of right elbow 06/16/2015  . Hemorrhoid 06/12/2014  . Absolute anemia 06/12/2014  . Abnormal facial hair 08/03/2013  . Trochanteric bursitis of both hips 06/12/2013  . Low back pain 05/21/2013  . Osteoarthritis of both knees 05/04/2013  . Metatarsalgia of left foot 05/04/2013  . Obesity 02/22/2012  . Migraine without aura 11/23/2011  . Chronic daily headache 11/23/2011  . Muscle spasm 11/23/2011  . Insomnia 11/23/2011  . RHEUMATOID ARTHRITIS 08/15/2008    Kerin Perna, PTA 08/28/2015 6:02 PM  [facility] [place], [place], [address] Phone: [phone]   Fax:  [phone]  Name: Rose Long MRN: [id number] Date of Birth: 1961-03-01

## 2015-08-28 NOTE — Therapy (Addendum)
[place], [place], [address] Phone: [phone]   Fax:  [phone]  Physical Therapy Treatment  Patient Details  Name: Rose Long MRN: [id number] Date of Birth: 1961/03/24 No Data Recorded  Encounter Date: 08/28/2015      PT End of Session - 08/28/15 1623    Visit Number 7   Number of Visits 12   Date for PT Re-Evaluation 09/08/15   PT Start Time 9379   PT Stop Time 1652   PT Time Calculation (min) 45 min   Activity Tolerance Patient tolerated treatment well;No increased pain      Past Medical History  Diagnosis Date  . Arthritis   . Obesity   . Anemia   . Simple ovarian cyst     Right    Past Surgical History  Procedure Laterality Date  . 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 Nasal sinus surgery  1999  . Breast surgery  1998    reduction  . Uterine ablation  2012  . Under arm skin removed    . Breast surgery  10/2013    Lift  . Abdominoplasty/panniculectomy      There were no vitals filed for this visit.  Visit Diagnosis:  Left-sided low back pain without sciatica  Abnormal posture  Stiffness due to immobility  Weakness of left leg  Decreased strength, endurance, and mobility      Subjective Assessment - 08/28/15 1623    Subjective Pt reports she has started walking program every other day (2-3 miles).  Lt hip and back have calmed down.     Currently in Pain? Yes   Pain Score 3   up to 8/10 with use of NuStep   Pain Location Knee   Pain Orientation Left   Pain Descriptors / Indicators Aching;Nagging             [facility] PT Assessment - 08/28/15 0001    Assessment   Medical Diagnosis Lt LBP   Onset Date/Surgical Date 06/03/15   Hand Dominance Left   Observation/Other Assessments   Focus on Therapeutic Outcomes (FOTO)  24% limited   AROM   Right Hip Flexion 111   Left Hip Extension 5  sidelying   Left Hip Flexion 110  supine   Right/Left Knee  Right;Left   Right Knee Extension -1   Right Knee Flexion 125   Left Knee Extension -6   Left Knee Flexion 105  with pain                     [facility] Adult PT Treatment/Exercise - 08/28/15 0001    Lumbar Exercises: Aerobic   Stationary Bike NuStep L4: 10 min    Lumbar Exercises: Prone   Other Prone Lumbar Exercises MFR with purple ball to Lt psoas 1 min to each tender area.    Knee/Hip Exercises: Stretches   Passive Hamstring Stretch 3 reps;Right;Left;30 seconds   Passive Hamstring Stretch Limitations with strap supine, then adductor stretch and ITB stretch; each leg   Modalities   Modalities --  declined due to 0/10 pain in LB at end of session   Manual Therapy   Manual Therapy Myofascial release   Myofascial Release to QL and paraspinals bilat (move time spent on Lt), piriformis Rt/Lt                      PT Long Term Goals - 08/28/15 1656    PT LONG  TERM GOAL #1   Title Patient I in HEP 09/08/15    Time 6   Period Weeks   Status Achieved   PT LONG TERM GOAL #2   Title Patient to identify modification for aerobic exercise program to identify the least stressful mode of exercise for orthopedic conditions 09/08/15   Time 6   Period Weeks   Status Achieved   PT LONG TERM GOAL #3   Title Improve flexibility through bilat hips and knees by 5-7 degrees 09/08/15   Time 6   Period Weeks   Status Partially Met   PT LONG TERM GOAL #4   Title Decrease intensity of pain with functional activities to 3/10 - 4/10 50-70% of day 09/08/15   Time 6   Period Weeks   Status Achieved   PT LONG TERM GOAL #5   Title Improve FOTO to </= 31% limitation 09/08/15   Time 6   Period Weeks   Status Achieved               Plan - 08/28/15 1656    Clinical Impression Statement Pt demonstrated improved hip and knee ROM.  Pt has not had back pain in over a wk and has started exercise program. Pt has partially met goals but feels satisfied with current level of  function; requests to d/c at this time.    Pt will benefit from skilled therapeutic intervention in order to improve on the following deficits Postural dysfunction;Improper body mechanics;Pain;Decreased range of motion;Decreased mobility;Decreased strength;Decreased endurance;Decreased activity tolerance;Increased fascial restricitons   Rehab Potential Good   PT Frequency 2x / week   PT Duration 6 weeks   PT Treatment/Interventions Patient/family education;ADLs/Self Care Home Management;Therapeutic exercise;Therapeutic activities;Manual techniques;Dry needling;Cryotherapy;Electrical Stimulation;Moist Heat;Ultrasound   PT Next Visit Plan Spoke to supervising PT, will d/c to HEP.         Problem List Patient Active Problem List   Diagnosis Date Noted  . Primary osteoarthritis of left wrist 07/28/2015  . Olecranon bursitis of right elbow 06/16/2015  . Hemorrhoid 06/12/2014  . Absolute anemia 06/12/2014  . Abnormal facial hair 08/03/2013  . Trochanteric bursitis of both hips 06/12/2013  . Low back pain 05/21/2013  . Osteoarthritis of both knees 05/04/2013  . Metatarsalgia of left foot 05/04/2013  . Obesity 02/22/2012  . Migraine without aura 11/23/2011  . Chronic daily headache 11/23/2011  . Muscle spasm 11/23/2011  . Insomnia 11/23/2011  . RHEUMATOID ARTHRITIS 08/15/2008    Shelbie Hutching 08/28/2015, 6:07 PM  [facility] [place], [place], [address] Phone: [phone]   Fax:  [phone]  Name: BROOKELIN FELBER MRN: [id number] Date of Birth: 1961-01-11    PHYSICAL THERAPY DISCHARGE SUMMARY  Visits from Start of Care: 7  Current functional level related to goals / functional outcomes: Returned to functional activities including a walking program. Confident in continuing with I HEP    Remaining deficits: Needs to continue with HEP   Education / Equipment: HEP Plan: Patient agrees to  discharge.  Patient goals were partially met. Patient is being discharged due to being pleased with the current functional level.  ?????     Celyn P. Helene Kelp PT, MPH 09/26/2015 8:44 AM

## 2015-09-02 ENCOUNTER — Other Ambulatory Visit: Payer: Self-pay | Admitting: Orthopaedic Surgery

## 2015-09-09 ENCOUNTER — Ambulatory Visit (INDEPENDENT_AMBULATORY_CARE_PROVIDER_SITE_OTHER): Payer: Managed Care, Other (non HMO) | Admitting: Sports Medicine

## 2015-09-09 ENCOUNTER — Encounter: Payer: Self-pay | Admitting: Sports Medicine

## 2015-09-09 VITALS — BP 115/75 | HR 74

## 2015-09-09 DIAGNOSIS — M7742 Metatarsalgia, left foot: Secondary | ICD-10-CM | POA: Diagnosis not present

## 2015-09-09 DIAGNOSIS — M170 Bilateral primary osteoarthritis of knee: Secondary | ICD-10-CM

## 2015-09-09 NOTE — Progress Notes (Signed)
  Subjective:    CC: Follow-up  HPI: Bilateral knee osteoarthritis: Right knee is doing okay after Orthovisc, left knee is scheduled for total arthroplasty.  Foot pain: History of metatarsalgia, now having a recurrence. She has not been wearing orthotics and is wearing flats.  Past medical history, Surgical history, Family history not pertinant except as noted below, Social history, Allergies, and medications have been entered into the medical record, reviewed, and no changes needed.   Review of Systems: No fevers, chills, night sweats, weight loss, chest pain, or shortness of breath.   Objective:    General: Well Developed, well nourished, and in no acute distress.  Neuro: Alert and oriented x3, extra-ocular muscles intact, sensation grossly intact.  HEENT: Normocephalic, atraumatic, pupils equal round reactive to light, neck supple, no masses, no lymphadenopathy, thyroid nonpalpable.  Skin: Warm and dry, no rashes. Cardiac: Regular rate and rhythm, no murmurs rubs or gallops, no lower extremity edema.  Respiratory: Clear to auscultation bilaterally. Not using accessory muscles, speaking in full sentences. Right foot:  Foot inspection and palpation reveals breakdown of the transverse arch and a drop of MT heads. Abnormal callus is present between the second, third, and fourth metatarsal heads. Hammer toes are present. TTP under the metatarsal heads.  Impression and Recommendations:   I spent 25 minutes with this patient, greater than 50% was face-to-face time counseling regarding the above diagnoses

## 2015-09-09 NOTE — Assessment & Plan Note (Signed)
We do need to build her a new set of custom orthotics after her total knee arthroplasty, with metatarsal pads.

## 2015-09-09 NOTE — Assessment & Plan Note (Signed)
Right knee is doing okay after Orthovisc, left knee scheduled for arthroplasty.

## 2015-09-19 ENCOUNTER — Encounter (HOSPITAL_COMMUNITY): Payer: Self-pay

## 2015-09-19 ENCOUNTER — Encounter (HOSPITAL_COMMUNITY)
Admission: RE | Admit: 2015-09-19 | Discharge: 2015-09-19 | Disposition: A | Discharge status: Home / Self Care | Payer: Managed Care, Other (non HMO) | Source: Ambulatory Visit | Attending: Orthopaedic Surgery | Admitting: Orthopaedic Surgery

## 2015-09-19 ENCOUNTER — Ambulatory Visit (HOSPITAL_COMMUNITY)
Admission: RE | Admit: 2015-09-19 | Discharge: 2015-09-19 | Disposition: A | Discharge status: Home / Self Care | Payer: Managed Care, Other (non HMO) | Source: Ambulatory Visit | Attending: Orthopaedic Surgery | Admitting: Orthopaedic Surgery

## 2015-09-19 DIAGNOSIS — Z01818 Encounter for other preprocedural examination: Secondary | ICD-10-CM | POA: Insufficient documentation

## 2015-09-19 DIAGNOSIS — Z01812 Encounter for preprocedural laboratory examination: Secondary | ICD-10-CM | POA: Insufficient documentation

## 2015-09-19 LAB — BASIC METABOLIC PANEL
Anion gap: 9 (ref 5–15)
BUN: 9 mg/dL (ref 6–20)
CALCIUM: 9 mg/dL (ref 8.9–10.3)
CO2: 23 mmol/L (ref 22–32)
CREATININE: 0.66 mg/dL (ref 0.44–1.00)
Chloride: 104 mmol/L (ref 101–111)
GFR calc Af Amer: >60 mL/min (ref >60)
GFR calc non Af Amer: >60 mL/min (ref >60)
GLUCOSE: 69 mg/dL (ref 65–99)
Potassium: 4 mmol/L (ref 3.5–5.1)
Sodium: 136 mmol/L (ref 135–145)

## 2015-09-19 LAB — TYPE AND SCREEN
ABO/RH(D): O POS
Antibody Screen: NEGATIVE

## 2015-09-19 LAB — URINALYSIS, ROUTINE W REFLEX MICROSCOPIC
Bilirubin Urine: NEGATIVE
GLUCOSE, UA: NEGATIVE mg/dL
Hgb urine dipstick: NEGATIVE
Ketones, ur: NEGATIVE mg/dL
Nitrite: NEGATIVE
PROTEIN: NEGATIVE mg/dL
Specific Gravity, Urine: 1.02 (ref 1.005–1.030)
pH: 6 (ref 5.0–8.0)

## 2015-09-19 LAB — CBC WITH DIFFERENTIAL/PLATELET
BASOS PCT: 1 %
Basophils Absolute: 0 10*3/uL (ref 0.0–0.1)
EOS ABS: 0.1 10*3/uL (ref 0.0–0.7)
EOS PCT: 1 %
HEMATOCRIT: 39.2 % (ref 36.0–46.0)
Hemoglobin: 12.9 g/dL (ref 12.0–15.0)
Lymphocytes Relative: 29 %
Lymphs Abs: 2.4 10*3/uL (ref 0.7–4.0)
MCH: 27.7 pg (ref 26.0–34.0)
MCHC: 32.9 g/dL (ref 30.0–36.0)
MCV: 84.3 fL (ref 78.0–100.0)
MONO ABS: 0.7 10*3/uL (ref 0.1–1.0)
MONOS PCT: 8 %
NEUTROS ABS: 5.1 10*3/uL (ref 1.7–7.7)
Neutrophils Relative %: 61 %
PLATELETS: 324 10*3/uL (ref 150–400)
RBC: 4.65 MIL/uL (ref 3.87–5.11)
RDW: 14.4 % (ref 11.5–15.5)
WBC: 8.3 10*3/uL (ref 4.0–10.5)

## 2015-09-19 LAB — SURGICAL PCR SCREEN
MRSA, PCR: NEGATIVE
Staphylococcus aureus: NEGATIVE

## 2015-09-19 LAB — URINE MICROSCOPIC-ADD ON

## 2015-09-19 LAB — PROTIME-INR
INR: 1.02 (ref 0.00–1.49)
Prothrombin Time: 13.6 seconds (ref 11.6–15.2)

## 2015-09-19 LAB — APTT: APTT: 36 s (ref 24–37)

## 2015-09-19 LAB — ABO/RH: ABO/RH(D): O POS

## 2015-09-19 MED ORDER — CHLORHEXIDINE GLUCONATE 4 % EX LIQD: 60.0000 mL | Freq: Once | CUTANEOUS | Status: DC

## 2015-09-19 NOTE — Pre-Procedure Instructions (Signed)
    Rose Long  09/19/2015      [facility] - [place], [place] [address] [place] [address] Phone: [phone] Fax: [phone]    Your procedure is scheduled on September 30, 2015.  Report to [facility] Admitting at 8 A.M.  Call this number if you have problems the morning of surgery:  [phone]   Remember:  Do not eat food or drink liquids after midnight.  Take these medicines the morning of surgery with A SIP OF WATER : If needed- cyclobenzaprine (FLEXERIL), SUMAtriptan (IMITREX)   STOP ASPIRIN, HERBAL MEDICATIONS, NSAID'S (ADVIL, ALEVE, IBUPROFEN) ONE WEEK PRIOR TO SURGERY   Do not wear jewelry, make-up or nail polish.  Do not wear lotions, powders, or perfumes.  You may wear deodorant.  Do not shave 48 hours prior to surgery.  Men may shave face and neck.  Do not bring valuables to the hospital.  [facility] is not responsible for any belongings or valuables.  Contacts, dentures or bridgework may not be worn into surgery.  Leave your suitcase in the car.  After surgery it may be brought to your room.  For patients admitted to the hospital, discharge time will be determined by your treatment team.  Patients discharged the day of surgery will not be allowed to drive home.   Name and phone number of your driver:    Special instructions:  "PREPARING FOR SURGERY"  Please read over the following fact sheets that you were given. Pain Booklet, Coughing and Deep Breathing, Blood Transfusion Information, Total Joint Packet and Surgical Site Infection Prevention

## 2015-09-25 ENCOUNTER — Encounter: Payer: Self-pay | Admitting: Sports Medicine

## 2015-09-26 NOTE — H&P (Signed)
TOTAL KNEE ADMISSION H&P  Patient is being admitted for left total knee arthroplasty.  Subjective:  Chief Complaint:left knee pain.  HPI: Rose Long, 54 y.o. female, has a history of pain and functional disability in the left knee due to arthritis and has failed non-surgical conservative treatments for greater than 12 weeks to includeNSAID's and/or analgesics, corticosteriod injections, viscosupplementation injections, flexibility and strengthening excercises, supervised PT with diminished ADL's post treatment, weight reduction as appropriate and activity modification.  Onset of symptoms was gradual, starting 5 years ago with gradually worsening course since that time. The patient noted no past surgery on the left knee(s).  Patient currently rates pain in the left knee(s) at 10 out of 10 with activity. Patient has night pain, worsening of pain with activity and weight bearing, pain that interferes with activities of daily living, crepitus and joint swelling.  Patient has evidence of subchondral cysts, subchondral sclerosis, periarticular osteophytes and joint space narrowing by imaging studies. There is no active infection.  Patient Active Problem List   Diagnosis Date Noted  . Primary osteoarthritis of left wrist 07/28/2015  . Olecranon bursitis of right elbow 06/16/2015  . Hemorrhoid 06/12/2014  . Absolute anemia 06/12/2014  . Abnormal facial hair 08/03/2013  . Trochanteric bursitis of both hips 06/12/2013  . Low back pain 05/21/2013  . Osteoarthritis of both knees 05/04/2013  . Metatarsalgia of left foot 05/04/2013  . Obesity 02/22/2012  . Migraine without aura 11/23/2011  . Chronic daily headache 11/23/2011  . Muscle spasm 11/23/2011  . Insomnia 11/23/2011  . RHEUMATOID ARTHRITIS 08/15/2008   Past Medical History  Diagnosis Date  . Arthritis   . Obesity   . Anemia   . Simple ovarian cyst     Right    Past Surgical History  Procedure Laterality Date  . 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 Nasal sinus surgery  1999  . Breast surgery  1998    reduction  . Uterine ablation  2012  . Under arm skin removed    . Breast surgery  10/2013    Lift  . Abdominoplasty/panniculectomy      No prescriptions prior to admission   Allergies  Allergen Reactions  . Sulfamethoxazole-Trimethoprim Anaphylaxis, Swelling and Rash    Redness and swelling  . Sulfa Antibiotics     Social History  Substance Use Topics  . Smoking status: Never Smoker   . Smokeless tobacco: Never Used  . Alcohol Use: 1.0 oz/week    2 Standard drinks or equivalent per week     Comment: socially    Family History  Problem Relation Age of Onset  . Hypertension Mother   . Hypertension Father   . Heart disease Maternal Grandfather   . Lung cancer Paternal Grandmother     lung  . Alcohol abuse Paternal Uncle   . Diabetes Cousin   . Bladder Cancer Mother   . Hyperlipidemia Mother      Review of Systems  Musculoskeletal: Positive for joint pain.       Left knee  All other systems reviewed and are negative.   Objective:  Physical Exam  Constitutional: She is oriented to person, place, and time. She appears well-developed and well-nourished.  HENT:  Head: Normocephalic and atraumatic.  Eyes: Conjunctivae are normal. Pupils are equal, round, and reactive to light.  Neck: Normal range of motion.  Cardiovascular: Normal rate and regular rhythm.   Respiratory: Effort normal.  GI: Soft.  Musculoskeletal:  Left knee has moderate valgus  deformity. Her motion is from about 10-100. She has lateral joint line pain and crepitation with trace effusion.  Hip motion is full and pain free and SLR is negative on both sides.  There is no palpable LAD behind either knee.  Sensation and motor function are intact on both sides and there are palpable pulses on both sides.  Neurological: She is alert and oriented to person, place, and time.  Skin: Skin is warm and dry.  Psychiatric: She has a normal  mood and affect. Her behavior is normal. Judgment and thought content normal.    Vital signs in last 24 hours:    Labs:   Estimated body mass index is 33.23 kg/(m^2) as calculated from the following:   Height as of 07/21/15: 5' 6.5" (1.689 m).   Weight as of 08/11/15: 94.802 kg (209 lb).   Imaging Review Plain radiographs demonstrate severe degenerative joint disease of the left knee(s). The overall alignment issignificant valgus. The bone quality appears to be good for age and reported activity level.  Assessment/Plan:  End stage primary arthritis, left knee   The patient history, physical examination, clinical judgment of the provider and imaging studies are consistent with end stage degenerative joint disease of the left knee(s) and total knee arthroplasty is deemed medically necessary. The treatment options including medical management, injection therapy arthroscopy and arthroplasty were discussed at length. The risks and benefits of total knee arthroplasty were presented and reviewed. The risks due to aseptic loosening, infection, stiffness, patella tracking problems, thromboembolic complications and other imponderables were discussed. The patient acknowledged the explanation, agreed to proceed with the plan and consent was signed. Patient is being admitted for inpatient treatment for surgery, pain control, PT, OT, prophylactic antibiotics, VTE prophylaxis, progressive ambulation and ADL's and discharge planning. The patient is planning to be discharged to skilled nursing facility

## 2015-09-29 MED ORDER — CEFAZOLIN SODIUM-DEXTROSE 2-3 GM-% IV SOLR
2.0000 g | INTRAVENOUS | Status: AC
  Administered 2015-09-30: 2 g via INTRAVENOUS
  Filled 2015-09-29: qty 50

## 2015-09-30 ENCOUNTER — Encounter (HOSPITAL_COMMUNITY)
Admission: AD | Disposition: A | Discharge status: Skilled Nursing Facility | Payer: Self-pay | Source: Ambulatory Visit | Attending: Orthopaedic Surgery

## 2015-09-30 ENCOUNTER — Encounter (HOSPITAL_COMMUNITY): Payer: Self-pay | Admitting: Certified Registered"

## 2015-09-30 ENCOUNTER — Inpatient Hospital Stay (HOSPITAL_COMMUNITY)
Admission: AD | Admit: 2015-09-30 | Discharge: 2015-10-02 | DRG: 470 | Disposition: A | Discharge status: Skilled Nursing Facility | Payer: Managed Care, Other (non HMO) | Source: Ambulatory Visit | Attending: Orthopaedic Surgery | Admitting: Orthopaedic Surgery

## 2015-09-30 ENCOUNTER — Inpatient Hospital Stay (HOSPITAL_COMMUNITY): Payer: Managed Care, Other (non HMO) | Admitting: Anesthesiology

## 2015-09-30 DIAGNOSIS — M1712 Unilateral primary osteoarthritis, left knee: Secondary | ICD-10-CM | POA: Diagnosis present

## 2015-09-30 DIAGNOSIS — M25562 Pain in left knee: Secondary | ICD-10-CM | POA: Diagnosis present

## 2015-09-30 DIAGNOSIS — M1710 Unilateral primary osteoarthritis, unspecified knee: Secondary | ICD-10-CM | POA: Diagnosis present

## 2015-09-30 DIAGNOSIS — Z9884 Bariatric surgery status: Secondary | ICD-10-CM | POA: Diagnosis not present

## 2015-09-30 DIAGNOSIS — M170 Bilateral primary osteoarthritis of knee: Principal | ICD-10-CM | POA: Diagnosis present

## 2015-09-30 HISTORY — DX: Adverse effect of unspecified anesthetic, initial encounter: T41.45XA

## 2015-09-30 HISTORY — DX: Family history of other specified conditions: Z84.89

## 2015-09-30 HISTORY — DX: Other complications of anesthesia, initial encounter: T88.59XA

## 2015-09-30 HISTORY — PX: TOTAL KNEE ARTHROPLASTY: SHX125

## 2015-09-30 SURGERY — ARTHROPLASTY, KNEE, TOTAL
Anesthesia: Monitor Anesthesia Care | Laterality: Left

## 2015-09-30 MED ORDER — HYDROMORPHONE HCL 1 MG/ML IJ SOLN
0.2500 mg | INTRAMUSCULAR | Status: DC | PRN
  Administered 2015-09-30 (×4): 0.5 mg via INTRAVENOUS

## 2015-09-30 MED ORDER — PHENOL 1.4 % MT LIQD: 1.0000 | OROMUCOSAL | Status: DC | PRN

## 2015-09-30 MED ORDER — ASPIRIN EC 325 MG PO TBEC: 325.0000 mg | DELAYED_RELEASE_TABLET | Freq: Two times a day (BID) | ORAL | Status: DC

## 2015-09-30 MED ORDER — METOCLOPRAMIDE HCL 5 MG/ML IJ SOLN
5.0000 mg | Freq: Three times a day (TID) | INTRAMUSCULAR | Status: DC | PRN
  Administered 2015-10-01: 10 mg via INTRAVENOUS
  Filled 2015-09-30: qty 2

## 2015-09-30 MED ORDER — HYDROMORPHONE HCL 1 MG/ML IJ SOLN
INTRAMUSCULAR | Status: AC
  Administered 2015-09-30: 1 mg
  Filled 2015-09-30: qty 1

## 2015-09-30 MED ORDER — BISACODYL 5 MG PO TBEC: 5.0000 mg | DELAYED_RELEASE_TABLET | Freq: Every day | ORAL | Status: DC | PRN

## 2015-09-30 MED ORDER — BUPIVACAINE LIPOSOME 1.3 % IJ SUSP
INTRAMUSCULAR | Status: DC | PRN
  Administered 2015-09-30: 40 mL

## 2015-09-30 MED ORDER — PROMETHAZINE HCL 25 MG/ML IJ SOLN: 6.2500 mg | INTRAMUSCULAR | Status: DC | PRN

## 2015-09-30 MED ORDER — PROPOFOL 500 MG/50ML IV EMUL
INTRAVENOUS | Status: DC | PRN
  Administered 2015-09-30: 100 ug/kg/min via INTRAVENOUS

## 2015-09-30 MED ORDER — METHOCARBAMOL 500 MG PO TABS
500.0000 mg | ORAL_TABLET | Freq: Four times a day (QID) | ORAL | Status: DC | PRN
  Administered 2015-09-30 – 2015-10-02 (×8): 500 mg via ORAL
  Filled 2015-09-30 (×9): qty 1

## 2015-09-30 MED ORDER — ONDANSETRON HCL 4 MG PO TABS
4.0000 mg | ORAL_TABLET | Freq: Four times a day (QID) | ORAL | Status: DC | PRN
  Administered 2015-10-01 – 2015-10-02 (×3): 4 mg via ORAL
  Filled 2015-09-30 (×3): qty 1

## 2015-09-30 MED ORDER — HYDROMORPHONE HCL 1 MG/ML IJ SOLN
INTRAMUSCULAR | Status: AC
  Administered 2015-09-30: 0.5 mg
  Filled 2015-09-30: qty 1

## 2015-09-30 MED ORDER — MIDAZOLAM HCL 2 MG/2ML IJ SOLN
0.5000 mg | Freq: Once | INTRAMUSCULAR | Status: AC
  Administered 2015-09-30: 0.5 mg via INTRAVENOUS

## 2015-09-30 MED ORDER — PNEUMOCOCCAL VAC POLYVALENT 25 MCG/0.5ML IJ INJ
0.5000 mL | INJECTION | INTRAMUSCULAR | Status: AC
  Administered 2015-10-01: 0.5 mL via INTRAMUSCULAR
  Filled 2015-09-30: qty 0.5

## 2015-09-30 MED ORDER — ACETAMINOPHEN 650 MG RE SUPP: 650.0000 mg | Freq: Four times a day (QID) | RECTAL | Status: DC | PRN

## 2015-09-30 MED ORDER — SUMATRIPTAN SUCCINATE 100 MG PO TABS
100.0000 mg | ORAL_TABLET | ORAL | Status: DC | PRN
  Administered 2015-10-01: 50 mg via ORAL
  Administered 2015-10-01 – 2015-10-02 (×2): 100 mg via ORAL
  Filled 2015-09-30 (×8): qty 1

## 2015-09-30 MED ORDER — OXYCODONE HCL 5 MG PO TABS
5.0000 mg | ORAL_TABLET | Freq: Once | ORAL | Status: AC
Start: 2015-09-30 — End: 2015-09-30
  Administered 2015-09-30: 5 mg via ORAL

## 2015-09-30 MED ORDER — ALUM & MAG HYDROXIDE-SIMETH 200-200-20 MG/5ML PO SUSP: 30.0000 mL | ORAL | Status: DC | PRN

## 2015-09-30 MED ORDER — METHOCARBAMOL 500 MG PO TABS
ORAL_TABLET | ORAL | Status: AC
  Administered 2015-09-30: 500 mg
  Filled 2015-09-30: qty 1

## 2015-09-30 MED ORDER — FENTANYL CITRATE (PF) 250 MCG/5ML IJ SOLN
INTRAMUSCULAR | Status: DC | PRN
  Administered 2015-09-30 (×3): 50 ug via INTRAVENOUS

## 2015-09-30 MED ORDER — MIDAZOLAM HCL 5 MG/5ML IJ SOLN
INTRAMUSCULAR | Status: DC | PRN
  Administered 2015-09-30: 2 mg via INTRAVENOUS

## 2015-09-30 MED ORDER — 0.9 % SODIUM CHLORIDE (POUR BTL) OPTIME
TOPICAL | Status: DC | PRN
  Administered 2015-09-30: 1000 mL

## 2015-09-30 MED ORDER — HYDROMORPHONE HCL 1 MG/ML IJ SOLN
0.5000 mg | INTRAMUSCULAR | Status: DC | PRN
  Administered 2015-09-30 – 2015-10-02 (×9): 1 mg via INTRAVENOUS
  Filled 2015-09-30 (×9): qty 1

## 2015-09-30 MED ORDER — GLYCOPYRROLATE 0.2 MG/ML IJ SOLN
INTRAMUSCULAR | Status: AC
  Filled 2015-09-30: qty 1

## 2015-09-30 MED ORDER — METOCLOPRAMIDE HCL 5 MG PO TABS
5.0000 mg | ORAL_TABLET | Freq: Three times a day (TID) | ORAL | Status: DC | PRN
  Administered 2015-10-01: 5 mg via ORAL

## 2015-09-30 MED ORDER — ONDANSETRON HCL 4 MG/2ML IJ SOLN
4.0000 mg | Freq: Four times a day (QID) | INTRAMUSCULAR | Status: DC | PRN
  Administered 2015-09-30 – 2015-10-01 (×2): 4 mg via INTRAVENOUS
  Filled 2015-09-30 (×3): qty 2

## 2015-09-30 MED ORDER — METHOCARBAMOL 1000 MG/10ML IJ SOLN
500.0000 mg | Freq: Four times a day (QID) | INTRAMUSCULAR | Status: DC | PRN
  Filled 2015-09-30: qty 5

## 2015-09-30 MED ORDER — HYDROCODONE-ACETAMINOPHEN 5-325 MG PO TABS
1.0000 | ORAL_TABLET | ORAL | Status: DC | PRN
  Administered 2015-09-30 – 2015-10-01 (×3): 2 via ORAL
  Filled 2015-09-30 (×3): qty 2

## 2015-09-30 MED ORDER — CEFAZOLIN SODIUM-DEXTROSE 2-3 GM-% IV SOLR
2.0000 g | Freq: Four times a day (QID) | INTRAVENOUS | Status: AC
  Administered 2015-09-30 – 2015-10-01 (×2): 2 g via INTRAVENOUS
  Filled 2015-09-30 (×2): qty 50

## 2015-09-30 MED ORDER — BUPIVACAINE LIPOSOME 1.3 % IJ SUSP
20.0000 mL | INTRAMUSCULAR | Status: DC
  Filled 2015-09-30: qty 20

## 2015-09-30 MED ORDER — HYDROMORPHONE HCL 1 MG/ML IJ SOLN: 0.5000 mg | Freq: Once | INTRAMUSCULAR | Status: DC

## 2015-09-30 MED ORDER — LACTATED RINGERS IV SOLN
INTRAVENOUS | Status: DC
  Administered 2015-10-01: 03:00:00 via INTRAVENOUS

## 2015-09-30 MED ORDER — BUPIVACAINE-EPINEPHRINE (PF) 0.5% -1:200000 IJ SOLN
INTRAMUSCULAR | Status: AC
  Filled 2015-09-30: qty 30

## 2015-09-30 MED ORDER — OXYCODONE HCL 5 MG PO TABS
ORAL_TABLET | ORAL | Status: AC
  Filled 2015-09-30: qty 1

## 2015-09-30 MED ORDER — LACTATED RINGERS IV SOLN
INTRAVENOUS | Status: DC
  Administered 2015-09-30 (×2): via INTRAVENOUS

## 2015-09-30 MED ORDER — MIDAZOLAM HCL 2 MG/2ML IJ SOLN
INTRAMUSCULAR | Status: AC
  Filled 2015-09-30: qty 2

## 2015-09-30 MED ORDER — PROMETHAZINE HCL 25 MG PO TABS
25.0000 mg | ORAL_TABLET | Freq: Four times a day (QID) | ORAL | Status: DC | PRN
  Administered 2015-10-01: 25 mg via ORAL
  Filled 2015-09-30: qty 1

## 2015-09-30 MED ORDER — SODIUM CHLORIDE 0.9 % IR SOLN
Status: DC | PRN
  Administered 2015-09-30: 3000 mL

## 2015-09-30 MED ORDER — MENTHOL 3 MG MT LOZG
1.0000 | LOZENGE | OROMUCOSAL | Status: DC | PRN
Start: 2015-09-30 — End: 2015-10-02

## 2015-09-30 MED ORDER — BUPIVACAINE IN DEXTROSE 0.75-8.25 % IT SOLN
INTRATHECAL | Status: DC | PRN
  Administered 2015-09-30: 15 mg via INTRATHECAL

## 2015-09-30 MED ORDER — DOCUSATE SODIUM 100 MG PO CAPS
100.0000 mg | ORAL_CAPSULE | Freq: Two times a day (BID) | ORAL | Status: DC
  Administered 2015-09-30 – 2015-10-02 (×4): 100 mg via ORAL
  Filled 2015-09-30 (×4): qty 1

## 2015-09-30 MED ORDER — TRANEXAMIC ACID 1000 MG/10ML IV SOLN
1000.0000 mg | INTRAVENOUS | Status: AC
  Administered 2015-09-30: 1000 mg via INTRAVENOUS
  Filled 2015-09-30: qty 10

## 2015-09-30 MED ORDER — ACETAMINOPHEN 325 MG PO TABS
650.0000 mg | ORAL_TABLET | Freq: Four times a day (QID) | ORAL | Status: DC | PRN
  Administered 2015-10-01: 650 mg via ORAL
  Filled 2015-09-30 (×2): qty 2

## 2015-09-30 MED ORDER — DIPHENHYDRAMINE HCL 12.5 MG/5ML PO ELIX
12.5000 mg | ORAL_SOLUTION | ORAL | Status: DC | PRN
  Administered 2015-10-01 – 2015-10-02 (×3): 25 mg via ORAL
  Filled 2015-09-30 (×3): qty 10

## 2015-09-30 MED ORDER — FENTANYL CITRATE (PF) 250 MCG/5ML IJ SOLN
INTRAMUSCULAR | Status: AC
  Filled 2015-09-30: qty 5

## 2015-09-30 SURGICAL SUPPLY — 69 items
BAG DECANTER FOR FLEXI CONT (MISCELLANEOUS) IMPLANT
BANDAGE ELASTIC 4 VELCRO ST LF (GAUZE/BANDAGES/DRESSINGS) ×3 IMPLANT
BANDAGE ELASTIC 6 VELCRO ST LF (GAUZE/BANDAGES/DRESSINGS) ×3 IMPLANT
BANDAGE ESMARK 6X9 LF (GAUZE/BANDAGES/DRESSINGS) ×1 IMPLANT
BENZOIN TINCTURE PRP APPL 2/3 (GAUZE/BANDAGES/DRESSINGS) ×3 IMPLANT
BLADE SAGITTAL 25.0X1.19X90 (BLADE) IMPLANT
BLADE SAGITTAL 25.0X1.19X90MM (BLADE)
BLADE SAW SGTL 13.0X1.19X90.0M (BLADE) IMPLANT
BLADE SURG ROTATE 9660 (MISCELLANEOUS) IMPLANT
BNDG ELASTIC 6X10 VLCR STRL LF (GAUZE/BANDAGES/DRESSINGS) ×3 IMPLANT
BNDG ESMARK 6X9 LF (GAUZE/BANDAGES/DRESSINGS) ×3
BNDG GAUZE ELAST 4 BULKY (GAUZE/BANDAGES/DRESSINGS) ×3 IMPLANT
BOWL SMART MIX CTS (DISPOSABLE) ×3 IMPLANT
CAP KNEE TOTAL 3 SIGMA ×3 IMPLANT
CEMENT HV SMART SET (Cement) ×6 IMPLANT
CLOSURE STERI-STRIP 1/2X4 (GAUZE/BANDAGES/DRESSINGS) ×1
CLOSURE WOUND 1/2 X4 (GAUZE/BANDAGES/DRESSINGS) ×1
CLSR STERI-STRIP ANTIMIC 1/2X4 (GAUZE/BANDAGES/DRESSINGS) ×2 IMPLANT
COVER SURGICAL LIGHT HANDLE (MISCELLANEOUS) ×3 IMPLANT
CUFF TOURNIQUET SINGLE 34IN LL (TOURNIQUET CUFF) ×3 IMPLANT
CUFF TOURNIQUET SINGLE 44IN (TOURNIQUET CUFF) IMPLANT
DECANTER SPIKE VIAL GLASS SM (MISCELLANEOUS) ×3 IMPLANT
DRAPE EXTREMITY T 121X128X90 (DRAPE) ×3 IMPLANT
DRAPE PROXIMA HALF (DRAPES) ×3 IMPLANT
DRAPE U-SHAPE 47X51 STRL (DRAPES) ×3 IMPLANT
DRSG ADAPTIC 3X8 NADH LF (GAUZE/BANDAGES/DRESSINGS) ×3 IMPLANT
DRSG PAD ABDOMINAL 8X10 ST (GAUZE/BANDAGES/DRESSINGS) ×3 IMPLANT
DURAPREP 26ML APPLICATOR (WOUND CARE) ×3 IMPLANT
ELECT REM PT RETURN 9FT ADLT (ELECTROSURGICAL) ×3
ELECTRODE REM PT RTRN 9FT ADLT (ELECTROSURGICAL) ×1 IMPLANT
GAUZE SPONGE 4X4 12PLY STRL (GAUZE/BANDAGES/DRESSINGS) ×3 IMPLANT
GLOVE BIO SURGEON STRL SZ8 (GLOVE) ×6 IMPLANT
GLOVE BIOGEL PI IND STRL 8 (GLOVE) ×2 IMPLANT
GLOVE BIOGEL PI INDICATOR 8 (GLOVE) ×4
GOWN STRL REUS W/ TWL LRG LVL3 (GOWN DISPOSABLE) ×1 IMPLANT
GOWN STRL REUS W/ TWL XL LVL3 (GOWN DISPOSABLE) ×2 IMPLANT
GOWN STRL REUS W/TWL LRG LVL3 (GOWN DISPOSABLE) ×2
GOWN STRL REUS W/TWL XL LVL3 (GOWN DISPOSABLE) ×4
HANDPIECE INTERPULSE COAX TIP (DISPOSABLE) ×2
HOOD PEEL AWAY FACE SHEILD DIS (HOOD) ×6 IMPLANT
IMMOBILIZER KNEE 22 UNIV (SOFTGOODS) ×3 IMPLANT
KIT BASIN OR (CUSTOM PROCEDURE TRAY) ×3 IMPLANT
KIT ROOM TURNOVER OR (KITS) ×3 IMPLANT
MANIFOLD NEPTUNE II (INSTRUMENTS) ×3 IMPLANT
NEEDLE 22X1 1/2 (OR ONLY) (NEEDLE) ×6 IMPLANT
NEEDLE HYPO 21X1 ECLIPSE (NEEDLE) ×3 IMPLANT
NS IRRIG 1000ML POUR BTL (IV SOLUTION) ×3 IMPLANT
PACK TOTAL JOINT (CUSTOM PROCEDURE TRAY) ×3 IMPLANT
PACK UNIVERSAL I (CUSTOM PROCEDURE TRAY) ×3 IMPLANT
PAD ABD 8X10 STRL (GAUZE/BANDAGES/DRESSINGS) ×3 IMPLANT
PAD ARMBOARD 7.5X6 YLW CONV (MISCELLANEOUS) ×6 IMPLANT
SET HNDPC FAN SPRY TIP SCT (DISPOSABLE) ×1 IMPLANT
SPONGE GAUZE 4X4 12PLY STER LF (GAUZE/BANDAGES/DRESSINGS) ×3 IMPLANT
STAPLER VISISTAT 35W (STAPLE) IMPLANT
STRIP CLOSURE SKIN 1/2X4 (GAUZE/BANDAGES/DRESSINGS) ×2 IMPLANT
SUCTION FRAZIER TIP 10 FR DISP (SUCTIONS) IMPLANT
SUT MNCRL AB 3-0 PS2 18 (SUTURE) IMPLANT
SUT VIC AB 0 CT1 27 (SUTURE) ×4
SUT VIC AB 0 CT1 27XBRD ANBCTR (SUTURE) ×2 IMPLANT
SUT VIC AB 2-0 CT1 27 (SUTURE) ×4
SUT VIC AB 2-0 CT1 TAPERPNT 27 (SUTURE) ×2 IMPLANT
SUT VLOC 180 0 24IN GS25 (SUTURE) ×3 IMPLANT
SYR 50ML LL SCALE MARK (SYRINGE) ×3 IMPLANT
SYRINGE 20CC LL (MISCELLANEOUS) ×6 IMPLANT
TOWEL OR 17X24 6PK STRL BLUE (TOWEL DISPOSABLE) ×3 IMPLANT
TOWEL OR 17X26 10 PK STRL BLUE (TOWEL DISPOSABLE) ×3 IMPLANT
TRAY FOLEY CATH 14FR (SET/KITS/TRAYS/PACK) IMPLANT
UPCHARGE REV TRAY MBT KNEE ×3 IMPLANT
WATER STERILE IRR 1000ML POUR (IV SOLUTION) ×6 IMPLANT

## 2015-09-30 NOTE — Transfer of Care (Signed)
Immediate Anesthesia Transfer of Care Note  Patient: Rose Long  Procedure(s) Performed: Procedure(s): TOTAL KNEE ARTHROPLASTY (Left)  Patient Location: PACU  Anesthesia Type:MAC and Spinal  Level of Consciousness: awake, alert , oriented and patient cooperative  Airway & Oxygen Therapy: Patient Spontanous Breathing and Patient connected to nasal cannula oxygen  Post-op Assessment: Report given to RN, Post -op Vital signs reviewed and stable and Patient moving all extremities  Post vital signs: Reviewed and stable  Last Vitals:  Filed Vitals:   09/30/15 0820  BP: 127/80  Pulse: 58  Temp: 36.2 C  Resp: 16    Complications: No apparent anesthesia complications

## 2015-09-30 NOTE — Progress Notes (Signed)
Lunch relief by S. Gregson RN 

## 2015-09-30 NOTE — Anesthesia Postprocedure Evaluation (Signed)
Anesthesia Post Note  Patient: Rose Long  Procedure(s) Performed: Procedure(s) (LRB): TOTAL KNEE ARTHROPLASTY (Left)  Patient location during evaluation: PACU Anesthesia Type: Spinal and MAC Level of consciousness: awake and alert Pain management: pain level controlled Vital Signs Assessment: post-procedure vital signs reviewed and stable Respiratory status: spontaneous breathing and respiratory function stable Cardiovascular status: blood pressure returned to baseline and stable Postop Assessment: spinal receding Anesthetic complications: no    Last Vitals:  Filed Vitals:   09/30/15 1300 09/30/15 1315  BP:    Pulse: 42 46  Temp:    Resp: 14 12    Last Pain:  Filed Vitals:   09/30/15 1329  PainSc: Asleep                 Melessa Cowell DANIEL

## 2015-09-30 NOTE — Progress Notes (Signed)
Pt complaining of unbearable pain after spinal wore off. Maxed out on Narcotics. Plan: Adductor canal block in PACU. Pt agrees. I performed a left Adductor canal block with great response. Marcaine 0.5% with Epi 25cc injected without problem.  Lillia Abed MD

## 2015-09-30 NOTE — Anesthesia Procedure Notes (Addendum)
Spinal Patient location during procedure: OR Start time: 09/30/2015 9:43 AM End time: 09/30/2015 9:53 AM Staffing Anesthesiologist: Duane Boston Performed by: anesthesiologist  Preanesthetic Checklist Completed: patient identified, surgical consent, pre-op evaluation, timeout performed, IV checked, risks and benefits discussed and monitors and equipment checked Spinal Block Patient position: sitting Prep: DuraPrep Patient monitoring: cardiac monitor, continuous pulse ox and blood pressure Approach: midline Location: L2-3 Injection technique: single-shot Needle Needle type: Pencan  Needle gauge: 24 G Needle length: 9 cm Additional Notes Functioning IV was confirmed and monitors were applied. Sterile prep and drape, including hand hygiene and sterile gloves were used. The patient was positioned and the spine was prepped. The skin was anesthetized with lidocaine.  Free flow of clear CSF was obtained prior to injecting local anesthetic into the CSF.  The spinal needle aspirated freely following injection.  The needle was carefully withdrawn.  The patient tolerated the procedure well.   Anesthesia Regional Block:  Adductor canal block  Pre-Anesthetic Checklist: ,, timeout performed, Correct Patient, Correct Site, Correct Laterality, Correct Procedure, Correct Position, site marked, Risks and benefits discussed,  Surgical consent,  Pre-op evaluation,  At surgeon's request and post-op pain management  Laterality: Left  Prep: chloraprep       Needles:  Injection technique: Single-shot  Needle Type: Echogenic Stimulator Needle     Needle Length: 9cm 9 cm Needle Gauge: 21 and 21 G    Additional Needles:  Procedures: ultrasound guided (picture in chart) Adductor canal block Narrative:  Start time: 09/30/2015 4:45 PM End time: 09/30/2015 4:55 PM Injection made incrementally with aspirations every 5 mL.  Performed by: Personally  Anesthesiologist: Lillia Abed  Additional  Notes: Pt complaining of unbearable pain after spinal wore off. Maxed out on Narcotics. Plan: Adductor canal block in PACU. Pt agrees.  Monitors applied. Patient sedated. Sterile prep and drape,hand hygiene and sterile gloves were used. Relevant anatomy identified.Needle position confirmed.Local anesthetic injected incrementally after negative aspiration. Local anesthetic spread visualized around nerve(s). Vascular puncture avoided. No complications. Image printed for medical record.The patient tolerated the procedure well.    Lillia Abed MD

## 2015-09-30 NOTE — Interval H&P Note (Signed)
OK for surgery PD 

## 2015-09-30 NOTE — Progress Notes (Signed)
Spinal has worn off, pt experiencing 8-9/10 pain after 2mg  IV Dilaudid & po Robaxin. Dr Conrad [place] fully updated-new order for additional 1mg  IV Dilaudid & po OXY IR. Will continue to monitor closely.

## 2015-09-30 NOTE — Op Note (Signed)
PREOP DIAGNOSIS: DJD LEFT KNEE POSTOP DIAGNOSIS:  same PROCEDURE: LEFT TKR ANESTHESIA: Spinal and MAC ATTENDING SURGEON: Burnette Valenti G ASSISTANTLoni Dolly PA  INDICATIONS FOR PROCEDURE: Rose Long is a 54 y.o. female who has struggled for a long time with pain due to degenerative arthritis of the left knee.  The patient has failed many conservative non-operative measures and at this point has pain which limits the ability to sleep and walk.  The patient is offered total knee replacement.  Informed operative consent was obtained after discussion of possible risks of anesthesia, infection, neurovascular injury, DVT, and death.  The importance of the post-operative rehabilitation protocol to optimize result was stressed extensively with the patient.  SUMMARY OF FINDINGS AND PROCEDURE:  Rose Long was taken to the operative suite where under the above anesthesia a left knee replacement was performed.  There were advanced degenerative changes and the bone quality was good.  We used the DePuyLCS system and placed size standard plus femur, 4 MBT revision tibia, 38 mm all polyethylene patella, and a size 12.5 mm spacer.  Loni Dolly PA-C assisted throughout and was invaluable to the completion of the case in that he helped retract and maintain exposure while I placed the components.  He also helped close thereby minimizing OR time.  The patient was admitted for appropriate post-op care to include perioperative antibiotics and mechanical and pharmacologic measures for DVT prophylaxis.  DESCRIPTION OF PROCEDURE:  Rose Long was taken to the operative suite where the above anesthesia was applied.  The patient was positioned supine and prepped and draped in normal sterile fashion.  An appropriate time out was performed.  After the administration of kefzol pre-op antibiotic the leg was elevated and exsanguinated and a tourniquet inflated.  A standard longitudinal incision was made on the  anterior knee.  Dissection was carried down to the extensor mechanism.  All appropriate anti-infective measures were used including the pre-operative antibiotic, betadine impregnated drape, and closed hooded exhaust systems for each member of the surgical team.  A medial parapatellar incision was made in the extensor mechanism and the knee cap flipped and the knee flexed.  Some residual meniscal tissues were removed along with any remaining ACL/PCL tissue.  A guide was placed on the tibia and a flat cut was made on it's superior surface.  An intramedullary guide was placed in the femur and was utilized to make anterior and posterior cuts creating an appropriate flexion gap.  A second intramedullary guide was placed in the femur to make a distal cut properly balancing the knee with an extension gap equal to the flexion gap.  The three bones sized to the above mentioned sizes and the appropriate guides were placed and utilized.  A trial reduction was done and the knee easily came to full extension and the patella tracked well on flexion.  The trial components were removed and all bones were cleaned with pulsatile lavage and then dried thoroughly.  Cement was mixed and was pressurized onto the bones followed by placement of the aforementioned components.  Excess cement was trimmed and pressure was held on the components until the cement had hardened.  The tourniquet was deflated and a small amount of bleeding was controlled with cautery and pressure.  The knee was irrigated thoroughly.  The extensor mechanism was re-approximated with V-loc suture in running fashion.  The knee was flexed and the repair was solid.  The subcutaneous tissues were re-approximated with #0 and #2-0 vicryl and  the skin closed with a subcuticular stitch and steristrips.  A sterile dressing was applied.  Intraoperative fluids, EBL, and tourniquet time can be obtained from anesthesia records.  DISPOSITION:  The patient was taken to recovery  room in stable condition and admitted for appropriate post-op care to include peri-operative antibiotic and DVT prophylaxis with mechanical and pharmacologic measures.  Rose Long G 09/30/2015, 11:41 AM

## 2015-09-30 NOTE — Progress Notes (Signed)
Pt unable to tolerate CPM at 0-90, decreased to 0-70 and that is better for her at this time. She still c/o 6/10 pain after additional 1mg  IV Dilaudid and po OXY IR. Dr Conrad [place] fully updated-he will be over to see pt, maybe do a block.

## 2015-09-30 NOTE — Progress Notes (Signed)
Orthopedic Tech Progress Note Patient Details:  Rose Long [birth year] [id number]  CPM Left Knee CPM Left Knee: On Left Knee Flexion (Degrees): 90 Left Knee Extension (Degrees): 0   [name] 09/30/2015, 12:50 PM

## 2015-09-30 NOTE — Addendum Note (Signed)
Addendum  created 09/30/15 1742 by Lillia Abed, MD   Modules edited: Anesthesia Blocks and Procedures, Clinical Notes   Clinical Notes:  File: [id number]; File: [id number]

## 2015-09-30 NOTE — Anesthesia Preprocedure Evaluation (Addendum)
Anesthesia Evaluation  Patient identified by MRN, date of birth, ID band Patient awake    Reviewed: Allergy & Precautions, NPO status , Patient's Chart, lab work & pertinent test results  History of Anesthesia Complications Negative for: history of anesthetic complications  Airway Mallampati: II  TM Distance: >3 FB Neck ROM: Full    Dental  (+) Teeth Intact, Dental Advisory Given   Pulmonary neg pulmonary ROS,    Pulmonary exam normal        Cardiovascular negative cardio ROS Normal cardiovascular exam     Neuro/Psych  Headaches, negative psych ROS   GI/Hepatic negative GI ROS, Neg liver ROS,   Endo/Other  Morbid obesity  Renal/GU negative Renal ROS     Musculoskeletal   Abdominal   Peds  Hematology   Anesthesia Other Findings   Reproductive/Obstetrics                            Anesthesia Physical Anesthesia Plan  ASA: III  Anesthesia Plan: MAC and Spinal   Post-op Pain Management:    Induction:   Airway Management Planned: Simple Face Mask  Additional Equipment:   Intra-op Plan:   Post-operative Plan:   Informed Consent: I have reviewed the patients History and Physical, chart, labs and discussed the procedure including the risks, benefits and alternatives for the proposed anesthesia with the patient or authorized representative who has indicated his/her understanding and acceptance.   Dental advisory given  Plan Discussed with: CRNA, Anesthesiologist and Surgeon  Anesthesia Plan Comments:        Anesthesia Quick Evaluation

## 2015-09-30 NOTE — Progress Notes (Signed)
Dr Conrad  here at bedside-time out done per protocol and he did adductor nerve block on pt. Pt tol. procedure very well and feels much more comfortable.

## 2015-10-01 MED ORDER — APIXABAN 2.5 MG PO TABS
2.5000 mg | ORAL_TABLET | Freq: Two times a day (BID) | ORAL | Status: DC
  Administered 2015-10-01 – 2015-10-02 (×3): 2.5 mg via ORAL
  Filled 2015-10-01 (×3): qty 1

## 2015-10-01 MED ORDER — OXYCODONE-ACETAMINOPHEN 5-325 MG PO TABS
1.0000 | ORAL_TABLET | ORAL | Status: DC | PRN
  Administered 2015-10-01 – 2015-10-02 (×7): 2 via ORAL
  Filled 2015-10-01 (×8): qty 2

## 2015-10-01 NOTE — Discharge Instructions (Signed)
Information on my medicine - ELIQUIS (apixaban)  This medication education was reviewed with me or my healthcare representative as part of my discharge preparation.  The pharmacist that spoke with me during my hospital stay was:  Duayne Cal, [facility]  Why was Eliquis prescribed for you? Eliquis was prescribed for you to reduce the risk of blood clots forming after orthopedic surgery.    What do You need to know about Eliquis? Take your Eliquis TWICE DAILY - one tablet in the morning and one tablet in the evening with or without food.  It would be best to take the dose about the same time each day for 12 days.  If you have difficulty swallowing the tablet whole please discuss with your pharmacist how to take the medication safely.  Take Eliquis exactly as prescribed by your doctor and DO NOT stop taking Eliquis without talking to the doctor who prescribed the medication.  Stopping without other medication to take the place of Eliquis may increase your risk of developing a clot.  After discharge, you should have regular check-up appointments with your healthcare provider that is prescribing your Eliquis.  What do you do if you miss a dose? If a dose of ELIQUIS is not taken at the scheduled time, take it as soon as possible on the same day and twice-daily administration should be resumed.  The dose should not be doubled to make up for a missed dose.  Do not take more than one tablet of ELIQUIS at the same time.  Important Safety Information A possible side effect of Eliquis is bleeding. You should call your healthcare provider right away if you experience any of the following: ? Bleeding from an injury or your nose that does not stop. ? Unusual colored urine (red or dark brown) or unusual colored stools (red or black). ? Unusual bruising for unknown reasons. ? A serious fall or if you hit your head (even if there is no bleeding).  Some medicines may interact with Eliquis and might  increase your risk of bleeding or clotting while on Eliquis. To help avoid this, consult your healthcare provider or pharmacist prior to using any new prescription or non-prescription medications, including herbals, vitamins, non-steroidal anti-inflammatory drugs (NSAIDs) and supplements.  This website has more information on Eliquis (apixaban): http://www.eliquis.com/eliquis/home

## 2015-10-01 NOTE — Clinical Social Work Placement (Addendum)
   CLINICAL SOCIAL WORK PLACEMENT  NOTE  Date:  10/01/2015  Patient Details  Name: Rose Long MRN: [id number] Date of Birth: 1961/01/25  Clinical Social Work is seeking post-discharge placement for this patient at the [place] level of care (*CSW will initial, date and re-position this form in  chart as items are completed):      Patient/family provided with [facility] Work Department's list of facilities offering this level of care within the geographic area requested by the patient (or if unable, by the patient's family).  Yes   Patient/family informed of their freedom to choose among providers that offer the needed level of care, that participate in Medicare, Medicaid or managed care program needed by the patient, have an available bed and are willing to accept the patient.  Yes   Patient/family informed of [facility]'s ownership interest in [facility] and [facility], as well as of the fact that they are under no obligation to receive care at these facilities.  PASRR submitted to EDS on 10/01/15     PASRR number received on 10/01/15     Existing PASRR number confirmed on       FL2 transmitted to all facilities in geographic area requested by pt/family on 10/01/15     FL2 transmitted to all facilities within larger geographic area on       Patient informed that his/her managed care company has contracts with or will negotiate with certain facilities, including the following:            Patient/family informed of bed offers received.  Patient chooses bed at       Physician recommends and patient chooses bed at      Patient to be transferred to   on  .  Patient to be transferred to facility by       Patient family notified on   of transfer.  Name of family member notified:        PHYSICIAN       Additional Comment:    _______________________________________________ Dulcy Fanny, LCSW 10/01/2015, 2:46 PM

## 2015-10-01 NOTE — Progress Notes (Signed)
Utilization review completed.  

## 2015-10-01 NOTE — Clinical Social Work Note (Addendum)
Clinical Social Work Assessment  Patient Details  Name: Rose Long MRN: [id number] Date of Birth: 1961-01-13  Date of referral:  10/01/15               Reason for consult:  Facility Placement                Permission sought to share information with:    Permission granted to share information::  Yes, Verbal Permission Granted  Name::        Agency::   [name])  Relationship::     Contact Information:     Housing/Transportation Living arrangements for the past 2 months:  [place] of Information:  Patient Patient Interpreter Needed:  None Criminal Activity/Legal Involvement Pertinent to Current Situation/Hospitalization:  No - Comment as needed Significant Relationships:  Parents Lives with:    Do you feel safe going back to the place where you live?  No (Skilled facility needs) Need for family participation in patient care:  No ([facility])  Care giving concerns:  No caregivers present at time of assessment   Social Worker assessment / plan:  CSW was consulted for SNF placement per PT evaluation.  Patient states she has pre-registered with [name] prior to admission.  CSW will coordinate care with SNF.  Patient states she is unsure regarding transportation at time of discharge pending family support vs. Insurance payment for [employer].  CSW will re-address at time of discharge.  Employment status:    Insurance information:  Other (Comment Required) Psychologist, counselling) PT Recommendations:  [place] / Referral to community resources:  House  Patient/Family's Response to care:  Patient is agreeable to SNF.  Patient/Family's Understanding of and Emotional Response to Diagnosis, Current Treatment, and Prognosis:  Patient is realistic regarding level of care needed at time of discharge.  Emotional Assessment Appearance:  Appears stated age Attitude/Demeanor/Rapport:   (appropriate) Affect (typically observed):   Accepting, Adaptable Orientation:  Oriented to Self, Oriented to Place, Oriented to  Time, Oriented to Situation Alcohol / Substance use:  Not Applicable Psych involvement (Current and /or in the community):  No (Comment)  Discharge Needs  Concerns to be addressed:  No discharge needs identified Readmission within the last 30 days:  No Current discharge risk:  None Barriers to Discharge:  No Barriers Identified   Dulcy Fanny, LCSW 10/01/2015, 2:44 PM

## 2015-10-01 NOTE — Progress Notes (Signed)
Orthopedic Tech Progress Note Patient Details:  [name] 1961-01-10 [id number]  Patient ID: Rose Long, female   DOB: [birth year], 54 y.o.   MRN: [id number] Placed pt's lle on cpm @ 0-60 degrees @1405 ; will increase as pt tolerates; RN notified  Hildred Priest 10/01/2015, 2:05 PM

## 2015-10-01 NOTE — Evaluation (Signed)
Physical Therapy Evaluation Patient Details Name: SUMAKO STOCKLIN MRN: [id number] DOB: [birth year] Today's Date: 10/01/2015   History of Present Illness  54 yo female with new TKA on LLE, with immobilizer until 10 SLR's.    Clinical Impression  Pt was seen for evaluation of her gait and mobility after receiving L TKA, very motivated and walking in hip flexion contracture.  Her plan was SNF with MD per pt, and will be going to [name] to stay with her mother.  Has significant pain and did ice her L knee after the visit.    Follow Up Recommendations SNF    Equipment Recommendations  Rolling walker with 5" wheels    Recommendations for Other Services Rehab consult     Precautions / Restrictions Precautions Precautions: Fall;Knee Precaution Booklet Issued: Yes (comment) Required Braces or Orthoses: Knee Immobilizer - Left Knee Immobilizer - Left: On when out of bed or walking Restrictions Weight Bearing Restrictions: Yes LLE Weight Bearing: Weight bearing as tolerated      Mobility  Bed Mobility Overal bed mobility: Needs Assistance Bed Mobility: Supine to Sit     Supine to sit: Mod assist     General bed mobility comments: HOB elevated and assisted under trunk and to move LLE  Transfers Overall transfer level: Needs assistance Equipment used: Rolling walker (2 wheeled);1 person hand held assist Transfers: Sit to/from [facility] Sit to Stand: Min assist;Mod assist Stand pivot transfers: Min assist       General transfer comment: reminders for hand placement and set up  Ambulation/Gait Ambulation/Gait assistance: Min assist Ambulation Distance (Feet): 35 Feet Assistive device: Rolling walker (2 wheeled);1 person hand held assist Gait Pattern/deviations: Step-to pattern;Shuffle;Decreased weight shift to left;Decreased dorsiflexion - right;Decreased dorsiflexion - left Gait velocity: reduced Gait velocity interpretation: Below normal speed  for age/gender    Stairs            Wheelchair Mobility    Modified Rankin (Stroke Patients Only)       Balance Overall balance assessment: Needs assistance Sitting-balance support: Feet supported Sitting balance-Leahy Scale: Fair   Postural control: Posterior lean Standing balance support: Bilateral upper extremity supported Standing balance-Leahy Scale: Poor                               Pertinent Vitals/Pain Pain Assessment: 0-10 Pain Score: 10-Worst pain ever Pain Location: L knee Pain Descriptors / Indicators: Aching;Operative site guarding Pain Intervention(s): Monitored during session;Premedicated before session;Repositioned;Ice applied    Home Living Family/patient expects to be discharged to:: Skilled nursing facility Living Arrangements: Spouse/significant other Available Help at Discharge: Family;Available PRN/intermittently Type of Home: House         Home Equipment: Walker - 2 wheels;Cane - single point Additional Comments: will go to SNF with her mother    Prior Function Level of Independence: Independent               Hand Dominance        Extremity/Trunk Assessment   Upper Extremity Assessment: Overall WFL for tasks assessed           Lower Extremity Assessment: LLE deficits/detail   LLE Deficits / Details: new TKA with severe ROM losses  Cervical / Trunk Assessment: Normal  Communication   Communication: No difficulties  Cognition Arousal/Alertness: Awake/alert Behavior During Therapy: WFL for tasks assessed/performed Overall Cognitive Status: Within Functional Limits for tasks assessed  General Comments General comments (skin integrity, edema, etc.): Pt is getting a great deal of edema on L knee with ice applied after stroke, has improved control of gait after using BR and returning to chair in room    Exercises Total Joint Exercises Ankle Circles/Pumps: AROM;Both;5  reps Quad Sets: AROM;Both;10 reps Gluteal Sets: AROM;Both;10 reps Hip ABduction/ADduction: AROM;Both;AAROM;5 reps Straight Leg Raises: AROM;AAROM;Both;5 reps Goniometric ROM: -30 extension and unchanged by walking      Assessment/Plan    PT Assessment Patient needs continued PT services  PT Diagnosis Difficulty walking;Acute pain   PT Problem List Decreased strength;Decreased range of motion;Decreased activity tolerance;Decreased balance;Decreased mobility;Decreased coordination;Decreased knowledge of use of DME;Decreased safety awareness;Decreased knowledge of precautions;Obesity;Decreased skin integrity;Pain  PT Treatment Interventions DME instruction;Gait training;Stair training;Functional mobility training;Therapeutic activities;Therapeutic exercise;Balance training;Neuromuscular re-education;Patient/family education   PT Goals (Current goals can be found in the Care Plan section) Acute Rehab PT Goals Patient Stated Goal: to get to be independent and safe walking PT Goal Formulation: With patient Time For Goal Achievement: 10/15/15 Potential to Achieve Goals: Good    Frequency 7X/week   Barriers to discharge Inaccessible home environment;Decreased caregiver support has steps and husband is working    Building control surveyor of Glass blower/designer During Treatment: Gait belt Activity Tolerance: Patient tolerated treatment well;Patient limited by pain Patient left: in chair;with call bell/phone within reach Nurse Communication: Mobility status         Time: 1008-1040 PT Time Calculation (min) (ACUTE ONLY): 32 min   Charges:   PT Evaluation $Initial PT Evaluation Tier I: 1 Procedure PT Treatments $Gait Training: 8-22 mins   PT G CodesRamond Dial 2015-10-03, 1:19 PM   Mee Hives, PT MS Acute Rehab Dept. Number: [facility] [id number] and [place] [phone]

## 2015-10-01 NOTE — Progress Notes (Signed)
Physical Therapy Treatment Patient Details Name: Rose Long MRN: [id number] DOB: 1960-11-06 Today's Date: 10/01/2015    History of Present Illness 54 yo female with new TKA on LLE, with immobilizer until 10 SLR's.      PT Comments    Afternoon visit with pt in bed and was up after PT left this AM for only a short time per pt due to nausea.  Her plan is to go to SNF still and is making progress with all goals to indicate good expectation for the transition to rehab.    Follow Up Recommendations  SNF     Equipment Recommendations  Rolling walker with 5" wheels    Recommendations for Other Services Rehab consult     Precautions / Restrictions Precautions Precautions: Fall;Knee Precaution Booklet Issued: Yes (comment) Required Braces or Orthoses: Knee Immobilizer - Left Knee Immobilizer - Left: On when out of bed or walking Restrictions Weight Bearing Restrictions: Yes LLE Weight Bearing: Weight bearing as tolerated    Mobility  Bed Mobility Overal bed mobility: Needs Assistance Bed Mobility: Supine to Sit     Supine to sit: Mod assist     General bed mobility comments: HOB elevated and assisted under trunk and to move LLE  Transfers Overall transfer level: Needs assistance Equipment used: Rolling walker (2 wheeled);1 person hand held assist   Sit to Stand: Min assist;Mod assist Stand pivot transfers: Min assist       General transfer comment: reminders for hand placement and set up  Ambulation/Gait Ambulation/Gait assistance: Min guard Ambulation Distance (Feet): 30 Feet Assistive device: Rolling walker (2 wheeled);1 person hand held assist Gait Pattern/deviations: Step-through pattern;Step-to pattern;Trunk flexed;Wide base of support;Shuffle;Decreased weight shift to left Gait velocity: reduced Gait velocity interpretation: Below normal speed for age/gender     Stairs            Wheelchair Mobility    Modified Rankin (Stroke Patients  Only)       Balance     Sitting balance-Leahy Scale: Good       Standing balance-Leahy Scale: Fair                      Cognition Arousal/Alertness: Awake/alert Behavior During Therapy: WFL for tasks assessed/performed Overall Cognitive Status: Within Functional Limits for tasks assessed                      Exercises Total Joint Exercises Ankle Circles/Pumps: AROM;Both;5 reps Quad Sets: AROM;Both;10 reps Gluteal Sets: AROM;Both;10 reps Hip ABduction/ADduction: AROM;Both;AAROM;5 reps Straight Leg Raises: AROM;AAROM;Both;10 reps Knee Flexion: AAROM;AROM;Both;10 reps Goniometric ROM: 74 flexion    General Comments        Pertinent Vitals/Pain      Home Living Family/patient expects to be discharged to:: Skilled nursing facility Living Arrangements: Spouse/significant other Available Help at Discharge: Family;Available PRN/intermittently Type of Home: House       Home Equipment: Walker - 2 wheels;Cane - single point Additional Comments: will go to SNF with her mother    Prior Function Level of Independence: Independent          PT Goals (current goals can now be found in the care plan section) Acute Rehab PT Goals Patient Stated Goal: to get to be independent and safe walking PT Goal Formulation: With patient Time For Goal Achievement: 10/15/15 Potential to Achieve Goals: Good    Frequency  7X/week    PT Plan      Co-evaluation  End of Session Equipment Utilized During Treatment: Gait belt Activity Tolerance: Patient tolerated treatment well;Patient limited by pain Patient left: in chair;with call bell/phone within reach     Time: [id number] PT Time Calculation (min) (ACUTE ONLY): 36 min  Charges:  $Gait Training: 8-22 mins $Therapeutic Exercise: 8-22 mins                    G Codes:      Ramond Dial 10/10/2015, 5:46 PM   Mee Hives, PT MS Acute Rehab Dept. Number: [facility] [id number] and [place] [phone]

## 2015-10-01 NOTE — Progress Notes (Signed)
ANTICOAGULATION CONSULT NOTE - Initial Consult  Pharmacy Consult for apixaban Indication: VTE prophylaxis  Allergies  Allergen Reactions  . Sulfa Antibiotics Anaphylaxis  . Sulfamethoxazole-Trimethoprim Anaphylaxis, Swelling and Rash    Redness and swelling    Patient Measurements:    Weight: 99 kg  Vital Signs: Temp: 98.2 F (36.8 C) (12/14 0500) Temp Source: Oral (12/14 0500) BP: 129/64 mmHg (12/14 0500) Pulse Rate: 60 (12/14 0500)  Labs: No results for input(s): HGB, HCT, PLT, APTT, LABPROT, INR, HEPARINUNFRC, CREATININE, CKTOTAL, CKMB, TROPONINI in the last 72 hours.  Estimated Creatinine Clearance: 96.5 mL/min (by C-G formula based on Cr of 0.66).   Medical History: Past Medical History  Diagnosis Date  . Arthritis   . Obesity   . Anemia   . Simple ovarian cyst     Right  . Complication of anesthesia     PONV  . Family history of adverse reaction to anesthesia     pt's mother stopped breathing possibly due to sleep apnea    Medications:  Prescriptions prior to admission  Medication Sig Dispense Refill Last Dose  . calcium citrate-vitamin D 200-200 MG-UNIT TABS Take 1 tablet by mouth daily.     Past Month at Unknown time  . Cholecalciferol (VITAMIN D3) 5000 UNITS TABS Take 1 tablet by mouth 2 (two) times daily.     Past Month at Unknown time  . diphenhydrAMINE (BENADRYL) 25 mg capsule Take 25 mg by mouth at bedtime.   09/29/2015 at Unknown time  . ferrous sulfate 325 (65 FE) MG tablet Take 325 mg by mouth daily with breakfast.   Past Week at Unknown time  . MAGNESIUM CITRATE PO Take 1 tablet by mouth 2 (two) times daily.    Past Week at Unknown time  . Multiple Vitamin (MULTIVITAMIN) tablet Take 1 tablet by mouth daily.     Past Month at Unknown time  . SUMAtriptan (IMITREX) 100 MG tablet TAKE 1 TABLET (100 MG TOTAL) BY MOUTH ONCE AS NEEDED FOR MIGRAINE. 9 tablet 6 09/30/2015 at Unknown time  . cyclobenzaprine (FLEXERIL) 10 MG tablet One half tab PO qHS, then  increase gradually to one tab TID. (Patient not taking: Reported on 09/18/2015) 30 tablet 0 Not Taking at Unknown time  . Iron-FA-B Cmp-C-Biot-Probiotic (FUSION PLUS) CAPS Take 1 capsule by mouth daily. (Patient not taking: Reported on 09/18/2015) 90 capsule 1 Not Taking at Unknown time  . Suvorexant (BELSOMRA) 15 MG TABS Take 15 mg by mouth daily. (Patient not taking: Reported on 09/18/2015) 30 tablet 5 Not Taking at Unknown time    Assessment: 54 yo F s/p L TKR POD #1.  Pharmacy consulted to dose apixaban for VTE px.  PT w/ gastric bypass 5 years ago and told not to [facility] asa or any other antiinflammatories.  This is to prevent ulcers.  OK to take apixaban. Wt 99 kg. Renal fxn OK.  Baseline Hg 13.6, PLTC 325.  Goal of Therapy:  Prevent VTE Monitor platelets by anticoagulation protocol: Yes   Plan:  apixaban 2.5 mg po bid x 12 days Will educate Rose Long, Pharm.D. [id number] 10/01/2015 8:37 AM

## 2015-10-01 NOTE — NC FL2 (Signed)
[place] LEVEL OF CARE SCREENING TOOL     IDENTIFICATION  Patient Name: Rose Long Birthdate: 1961/07/12 Sex: female Admission Date (Current Location): 09/30/2015  [facility] and [place] Number: Herbalist and Address:  [facility], [place] [address] [place], [place] [address]      Provider Number: [id number]  Attending Physician Name and Address:  Melrose Nakayama, MD  Relative Name and Phone Number:       Current Level of Care: Hospital Recommended Level of Care: [place] Prior Approval Number:    Date Approved/Denied:   PASRR Number: [id number]  Discharge Plan: SNF    Current Diagnoses: Patient Active Problem List   Diagnosis Date Noted  . Primary osteoarthritis of left knee 09/30/2015  . Primary osteoarthritis of knee 09/30/2015  . Primary osteoarthritis of left wrist 07/28/2015  . Olecranon bursitis of right elbow 06/16/2015  . Hemorrhoid 06/12/2014  . Absolute anemia 06/12/2014  . Abnormal facial hair 08/03/2013  . Trochanteric bursitis of both hips 06/12/2013  . Low back pain 05/21/2013  . Osteoarthritis of both knees 05/04/2013  . Metatarsalgia of left foot 05/04/2013  . Obesity 02/22/2012  . Migraine without aura 11/23/2011  . Chronic daily headache 11/23/2011  . Muscle spasm 11/23/2011  . Insomnia 11/23/2011  . RHEUMATOID ARTHRITIS 08/15/2008    Orientation RESPIRATION BLADDER Height & Weight    Self, Time, Situation, Place    Continent 5\' 6"  (167.6 cm) 218 lbs.  BEHAVIORAL SYMPTOMS/MOOD NEUROLOGICAL BOWEL NUTRITION STATUS      Continent    AMBULATORY STATUS COMMUNICATION OF NEEDS Skin   Limited Assist Verbally Surgical wounds                       Personal Care Assistance Level of Assistance  Bathing, Dressing Bathing Assistance: Limited assistance   Dressing Assistance: Limited assistance     Functional Limitations Info             SPECIAL CARE FACTORS  FREQUENCY  PT (By licensed PT), OT (By licensed OT)     PT Frequency: daily OT Frequency: daily            Contractures Contractures Info: Not present    Additional Factors Info  Allergies   Allergies Info: sulfa antibiotics, sulfamethaxazole-trimethoprim           Current Medications (10/01/2015):  This is the current hospital active medication list Current Facility-Administered Medications  Medication Dose Route Frequency Provider Last Rate Last Dose  . acetaminophen (TYLENOL) tablet 650 mg  650 mg Oral Q6H PRN Loni Dolly, PA-C       Or  . acetaminophen (TYLENOL) suppository 650 mg  650 mg Rectal Q6H PRN Loni Dolly, PA-C      . alum & mag hydroxide-simeth (MAALOX/MYLANTA) 200-200-20 MG/5ML suspension 30 mL  30 mL Oral Q4H PRN Loni Dolly, PA-C      . apixaban Arne Cleveland) tablet 2.5 mg  2.5 mg Oral BID Eudelia Bunch, RPH   2.5 mg at 10/01/15 [id number]  . bisacodyl (DULCOLAX) EC tablet 5 mg  5 mg Oral Daily PRN Loni Dolly, PA-C      . diphenhydrAMINE (BENADRYL) 12.5 MG/5ML elixir 12.5-25 mg  12.5-25 mg Oral Q4H PRN Loni Dolly, PA-C   25 mg at 10/01/15 0427  . docusate sodium (COLACE) capsule 100 mg  100 mg Oral BID Loni Dolly, PA-C   100 mg at 10/01/15 0830  . HYDROmorphone (DILAUDID)  injection 0.5-1 mg  0.5-1 mg Intravenous Q3H PRN Loni Dolly, PA-C   1 mg at 10/01/15 1418  . lactated ringers infusion   Intravenous Continuous Loni Dolly, PA-C 50 mL/hr at 10/01/15 0320    . menthol-cetylpyridinium (CEPACOL) lozenge 3 mg  1 lozenge Oral PRN Loni Dolly, PA-C       Or  . phenol (CHLORASEPTIC) mouth spray 1 spray  1 spray Mouth/Throat PRN Loni Dolly, PA-C      . methocarbamol (ROBAXIN) tablet 500 mg  500 mg Oral Q6H PRN Loni Dolly, PA-C   500 mg at 10/01/15 1254   Or  . methocarbamol (ROBAXIN) 500 mg in dextrose 5 % 50 mL IVPB  500 mg Intravenous Q6H PRN Loni Dolly, PA-C      . metoCLOPramide (REGLAN) tablet 5-10 mg  5-10 mg Oral Q8H PRN Loni Dolly, PA-C       Or  .  metoCLOPramide (REGLAN) injection 5-10 mg  5-10 mg Intravenous Q8H PRN Loni Dolly, PA-C   10 mg at 10/01/15 0836  . ondansetron (ZOFRAN) tablet 4 mg  4 mg Oral Q6H PRN Loni Dolly, PA-C       Or  . ondansetron [facility]) injection 4 mg  4 mg Intravenous Q6H PRN Loni Dolly, PA-C   4 mg at 10/01/15 0439  . oxyCODONE-acetaminophen (PERCOCET/ROXICET) 5-325 MG per tablet 1-2 tablet  1-2 tablet Oral Q4H PRN Loni Dolly, PA-C   2 tablet at 10/01/15 1253  . promethazine (PHENERGAN) tablet 25 mg  25 mg Oral Q6H PRN Melrose Nakayama, MD   25 mg at 10/01/15 1253  . SUMAtriptan (IMITREX) tablet 100 mg  100 mg Oral Q2H PRN Loni Dolly, PA-C   50 mg at 10/01/15 0423     Discharge Medications: Please see discharge summary for a list of discharge medications.  Relevant Imaging Results:  Relevant Lab Results:   Additional Information SSN: [id number]  Dulcy Fanny, LCSW

## 2015-10-01 NOTE — Progress Notes (Addendum)
Subjective: 1 Day Post-Op Procedure(s) (LRB): TOTAL KNEE ARTHROPLASTY (Left)   Patient still havign significant pain this morning. She also mentions that she had gastric bypass surgery abotu 5 years ago and that she can not take ASA or any other antiinflammatories.  Activity level:  wbat Diet tolerance:  ok Voiding:  ok Patient reports pain as mild and moderate.    Objective: Vital signs in last 24 hours: Temp:  [97.1 F (36.2 C)-98.8 F (37.1 C)] 98.2 F (36.8 C) (12/14 0500) Pulse Rate:  [42-126] 60 (12/14 0500) Resp:  [10-23] 16 (12/14 0500) BP: (111-148)/(64-99) 129/64 mmHg (12/14 0500) SpO2:  [94 %-100 %] 94 % (12/14 0500)  Labs: No results for input(s): HGB in the last 72 hours. No results for input(s): WBC, RBC, HCT, PLT in the last 72 hours. No results for input(s): NA, K, CL, CO2, BUN, CREATININE, GLUCOSE, CALCIUM in the last 72 hours. No results for input(s): LABPT, INR in the last 72 hours.  Physical Exam:  Neurologically intact ABD soft Neurovascular intact Sensation intact distally Intact pulses distally Dorsiflexion/Plantar flexion intact Incision: dressing C/D/I and no drainage No cellulitis present Compartment soft  Assessment/Plan:  1 Day Post-Op Procedure(s) (LRB): TOTAL KNEE ARTHROPLASTY (Left) Advance diet Up with therapy D/C IV fluids Plan for discharge tomorrow Discharge to SNF, [name] [place], if doing well and cleared by PT. I will change dressing to aquacel tomorrow. Due to history of gastric bypass we will put patient on Eliquis instead of ASA 325mg . A pharmacy consult was placed for this. Follow up in office 2 weeks post op. We will change pain meds from norco to percocet for better pain control.  Rose Long, Rose Long 10/01/2015, 7:47 AM

## 2015-10-02 ENCOUNTER — Encounter (HOSPITAL_COMMUNITY): Payer: Self-pay | Admitting: Orthopaedic Surgery

## 2015-10-02 MED ORDER — OXYCODONE-ACETAMINOPHEN 7.5-325 MG PO TABS: 1.0000 | ORAL_TABLET | ORAL | Status: DC | PRN

## 2015-10-02 MED ORDER — METHOCARBAMOL 500 MG PO TABS: 500.0000 mg | ORAL_TABLET | Freq: Four times a day (QID) | ORAL | Status: DC | PRN

## 2015-10-02 MED ORDER — APIXABAN 2.5 MG PO TABS: 2.5000 mg | ORAL_TABLET | Freq: Two times a day (BID) | ORAL | Status: DC

## 2015-10-02 NOTE — Discharge Summary (Signed)
Patient ID: [facility] MRN: [id number] DOB/AGE: 1961/09/20 54 y.o.  Admit date: 09/30/2015 Discharge date: 10/02/2015  Admission Diagnoses:  Principal Problem:   Primary osteoarthritis of left knee Active Problems:   Primary osteoarthritis of knee   Discharge Diagnoses:  Same  Past Medical History  Diagnosis Date  . Arthritis   . Obesity   . Anemia   . Simple ovarian cyst     Right  . Complication of anesthesia     PONV  . Family history of adverse reaction to anesthesia     pt's mother stopped breathing possibly due to sleep apnea    Surgeries: Procedure(s): TOTAL KNEE ARTHROPLASTY on 09/30/2015   Consultants:    Discharged Condition: Improved  Hospital Course: Rose Long is an 54 y.o. female who was admitted 09/30/2015 for operative treatment ofPrimary osteoarthritis of left knee. Patient has severe unremitting pain that affects sleep, daily activities, and work/hobbies. After pre-op clearance the patient was taken to the operating room on 09/30/2015 and underwent  Procedure(s): TOTAL KNEE ARTHROPLASTY.    Patient was given perioperative antibiotics: Anti-infectives    Start     Dose/Rate Route Frequency Ordered Stop   09/30/15 1930  ceFAZolin (ANCEF) IVPB 2 g/50 mL premix     2 g 100 mL/hr over 30 Minutes Intravenous Every 6 hours 09/30/15 1829 10/01/15 0352   09/30/15 0930  ceFAZolin (ANCEF) IVPB 2 g/50 mL premix     2 g 100 mL/hr over 30 Minutes Intravenous To ShortStay Surgical 09/29/15 1442 09/30/15 0954       Patient was given sequential compression devices, early ambulation, and chemoprophylaxis to prevent DVT.  Patient benefited maximally from hospital stay and there were no complications.    Recent vital signs: Patient Vitals for the past 24 hrs:  BP Temp Temp src Pulse Resp SpO2  10/02/15 1252 127/78 mmHg 99.3 F (37.4 C) - 95 18 96 %  10/02/15 0625 126/65 mmHg 99.6 F (37.6 C) Oral 83 18 100 %  10/01/15 2100 - 99.8 F (37.7 C)  Oral - - -  10/01/15 2016 133/69 mmHg (!) 100.9 F (38.3 C) Oral 88 18 100 %     Recent laboratory studies: No results for input(s): WBC, HGB, HCT, PLT, NA, K, CL, CO2, BUN, CREATININE, GLUCOSE, INR, CALCIUM in the last 72 hours.  Invalid input(s): PT, 2   Discharge Medications:     Medication List    TAKE these medications        apixaban 2.5 MG Tabs tablet  Commonly known as:  ELIQUIS  Take 1 tablet (2.5 mg total) by mouth 2 (two) times daily.     calcium citrate-vitamin D 200-200 MG-UNIT Tabs  Take 1 tablet by mouth daily.     cyclobenzaprine 10 MG tablet  Commonly known as:  FLEXERIL  One half tab PO qHS, then increase gradually to one tab TID.     diphenhydrAMINE 25 mg capsule  Commonly known as:  BENADRYL  Take 25 mg by mouth at bedtime.     ferrous sulfate 325 (65 FE) MG tablet  Take 325 mg by mouth daily with breakfast.     FUSION PLUS Caps  Take 1 capsule by mouth daily.     MAGNESIUM CITRATE PO  Take 1 tablet by mouth 2 (two) times daily.     methocarbamol 500 MG tablet  Commonly known as:  ROBAXIN  Take 1 tablet (500 mg total) by mouth every 6 (six) hours as needed for  muscle spasms.     multivitamin tablet  Take 1 tablet by mouth daily.     oxyCODONE-acetaminophen 7.5-325 MG tablet  Commonly known as:  PERCOCET  Take 1-2 tablets by mouth every 4 (four) hours as needed for severe pain.     SUMAtriptan 100 MG tablet  Commonly known as:  IMITREX  TAKE 1 TABLET (100 MG TOTAL) BY MOUTH ONCE AS NEEDED FOR MIGRAINE.     Suvorexant 15 MG Tabs  Commonly known as:  BELSOMRA  Take 15 mg by mouth daily.     Vitamin D3 5000 UNITS Tabs  Take 1 tablet by mouth 2 (two) times daily.        Diagnostic Studies: Dg Chest 2 View  09/19/2015  CLINICAL DATA:  Left knee arthroplasty. EXAM: CHEST  2 VIEW COMPARISON:  11/21/2009. FINDINGS: Mediastinum and hilar structures are normal. The lungs are clear. Heart size normal. No pleural effusion pneumothorax. No acute  bony abnormality. IMPRESSION: No acute cardiopulmonary disease. Electronically Signed   By: Marcello Moores  Register   On: 09/19/2015 16:19    Disposition: 01-Home or Self Care      Discharge Instructions    Call MD / Call 911    Complete by:  As directed   If you experience chest pain or shortness of breath, CALL 911 and be transported to the hospital emergency room.  If you develope a fever above 101 F, pus (white drainage) or increased drainage or redness at the wound, or calf pain, call your surgeon's office.     Constipation Prevention    Complete by:  As directed   Drink plenty of fluids.  Prune juice may be helpful.  You may use a stool softener, such as Colace (over the counter) 100 mg twice a day.  Use MiraLax (over the counter) for constipation as needed.     Diet - low sodium heart healthy    Complete by:  As directed      Discharge instructions    Complete by:  As directed   INSTRUCTIONS AFTER JOINT REPLACEMENT   Remove items at home which could result in a fall. This includes throw rugs or furniture in walking pathways ICE to the affected joint every three hours while awake for 30 minutes at a time, for at least the first 3-5 days, and then as needed for pain and swelling.  Continue to use ice for pain and swelling. You may notice swelling that will progress down to the foot and ankle.  This is normal after surgery.  Elevate your leg when you are not up walking on it.   Continue to use the breathing machine you got in the hospital (incentive spirometer) which will help keep your temperature down.  It is common for your temperature to cycle up and down following surgery, especially at night when you are not up moving around and exerting yourself.  The breathing machine keeps your lungs expanded and your temperature down.   DIET:  As you were doing prior to hospitalization, we recommend a well-balanced diet.  DRESSING / WOUND CARE / SHOWERING  You may shower 3 days after surgery, but  keep the wounds dry during showering.  You may use an occlusive plastic wrap (Press'n Seal for example), NO SOAKING/SUBMERGING IN THE BATHTUB.  If the bandage gets wet, change with a clean dry gauze.  If the incision gets wet, pat the wound dry with a clean towel.  ACTIVITY  Increase activity slowly as tolerated, but follow  the weight bearing instructions below.   No driving for 6 weeks or until further direction given by your physician.  You cannot drive while taking narcotics.  No lifting or carrying greater than 10 lbs. until further directed by your surgeon. Avoid periods of inactivity such as sitting longer than an hour when not asleep. This helps prevent blood clots.  You may return to work once you are authorized by your doctor.     WEIGHT BEARING   Weight bearing as tolerated with assist device (walker, cane, etc) as directed, use it as long as suggested by your surgeon or therapist, typically at least 4-6 weeks.   EXERCISES  Results after joint replacement surgery are often greatly improved when you follow the exercise, range of motion and muscle strengthening exercises prescribed by your doctor. Safety measures are also important to protect the joint from further injury. Any time any of these exercises cause you to have increased pain or swelling, decrease what you are doing until you are comfortable again and then slowly increase them. If you have problems or questions, call your caregiver or physical therapist for advice.   Rehabilitation is important following a joint replacement. After just a few days of immobilization, the muscles of the leg can become weakened and shrink (atrophy).  These exercises are designed to build up the tone and strength of the thigh and leg muscles and to improve motion. Often times heat used for twenty to thirty minutes before working out will loosen up your tissues and help with improving the range of motion but do not use heat for the first two weeks  following surgery (sometimes heat can increase post-operative swelling).   These exercises can be done on a training (exercise) mat, on the floor, on a table or on a bed. Use whatever works the best and is most comfortable for you.    Use music or television while you are exercising so that the exercises are a pleasant break in your day. This will make your life better with the exercises acting as a break in your routine that you can look forward to.   Perform all exercises about fifteen times, three times per day or as directed.  You should exercise both the operative leg and the other leg as well.   Exercises include:   Quad Sets - Tighten up the muscle on the front of the thigh (Quad) and hold for 5-10 seconds.   Straight Leg Raises - With your knee straight (if you were given a brace, keep it on), lift the leg to 60 degrees, hold for 3 seconds, and slowly lower the leg.  Perform this exercise against resistance later as your leg gets stronger.  Leg Slides: Lying on your back, slowly slide your foot toward your buttocks, bending your knee up off the floor (only go as far as is comfortable). Then slowly slide your foot back down until your leg is flat on the floor again.  Angel Wings: Lying on your back spread your legs to the side as far apart as you can without causing discomfort.  Hamstring Strength:  Lying on your back, push your heel against the floor with your leg straight by tightening up the muscles of your buttocks.  Repeat, but this time bend your knee to a comfortable angle, and push your heel against the floor.  You may put a pillow under the heel to make it more comfortable if necessary.   A rehabilitation program following joint replacement surgery  can speed recovery and prevent re-injury in the future due to weakened muscles. Contact your doctor or a physical therapist for more information on knee rehabilitation.    CONSTIPATION  Constipation is defined medically as fewer than three  stools per week and severe constipation as less than one stool per week.  Even if you have a regular bowel pattern at home, your normal regimen is likely to be disrupted due to multiple reasons following surgery.  Combination of anesthesia, postoperative narcotics, change in appetite and fluid intake all can affect your bowels.   YOU MUST use at least one of the following options; they are listed in order of increasing strength to get the job done.  They are all available over the counter, and you may need to use some, POSSIBLY even all of these options:    Drink plenty of fluids (prune juice may be helpful) and high fiber foods Colace 100 mg by mouth twice a day  Senokot for constipation as directed and as needed Dulcolax (bisacodyl), take with full glass of water  Miralax (polyethylene glycol) once or twice a day as needed.  If you have tried all these things and are unable to have a bowel movement in the first 3-4 days after surgery call either your surgeon or your primary doctor.    If you experience loose stools or diarrhea, hold the medications until you stool forms back up.  If your symptoms do not get better within 1 week or if they get worse, check with your doctor.  If you experience "the worst abdominal pain ever" or develop nausea or vomiting, please contact the office immediately for further recommendations for treatment.   ITCHING:  If you experience itching with your medications, try taking only a single pain pill, or even half a pain pill at a time.  You can also use Benadryl over the counter for itching or also to help with sleep.   TED HOSE STOCKINGS:  Use stockings on both legs until for at least 2 weeks or as directed by physician office. They may be removed at night for sleeping.  MEDICATIONS:  See your medication summary on the "After Visit Summary" that nursing will review with you.  You may have some home medications which will be placed on hold until you complete the course  of blood thinner medication.  It is important for you to complete the blood thinner medication as prescribed.  PRECAUTIONS:  If you experience chest pain or shortness of breath - call 911 immediately for transfer to the hospital emergency department.   If you develop a fever greater that 101 F, purulent drainage from wound, increased redness or drainage from wound, foul odor from the wound/dressing, or calf pain - CONTACT YOUR SURGEON.                                                   FOLLOW-UP APPOINTMENTS:  If you do not already have a post-op appointment, please call the office for an appointment to be seen by your surgeon.  Guidelines for how soon to be seen are listed in your "After Visit Summary", but are typically between 1-4 weeks after surgery.  OTHER INSTRUCTIONS:   Knee Replacement:  Do not place pillow under knee, focus on keeping the knee straight while resting. CPM instructions: 0-90 degrees, 2 hours in  the morning, 2 hours in the afternoon, and 2 hours in the evening. Place foam block, curve side up under heel at all times except when in CPM or when walking.  DO NOT modify, tear, cut, or change the foam block in any way.  MAKE SURE YOU:  Understand these instructions.  Get help right away if you are not doing well or get worse.    Thank you for letting us be a part of your medical care team.  It is a privilege we respect greatly.  We hope these instructions will help you stay on track for a fast and full recovery!     Increase activity slowly as tolerated    Complete by:  As directed            Follow-up Information    Follow up with Hessie Dibble, MD. Schedule an appointment as soon as possible for a visit in 2 weeks.   Specialty:  Orthopedic Surgery   Contact information:   [place] [id number] [phone]        Signed: Rich Fuchs 10/02/2015, 2:39 PM

## 2015-10-02 NOTE — Progress Notes (Signed)
Last note states patient d/c home. Error. Patient d/c to [name] ([facility]).

## 2015-10-02 NOTE — Discharge Planning (Signed)
[facility] be [facility]. Patient updated regarding discharge.  Facility: [facility] (Room 809 P) RN report number: [phone] Transportation: Greig Castilla, [facility]: [id number] Surgical: [phone]

## 2015-10-02 NOTE — Progress Notes (Signed)
OT Cancellation Note  Patient Details Name: Rose Long MRN: [id number] DOB: 04-Feb-1961   Cancelled Treatment:    Reason Eval/Treat Not Completed: Other (comment) (Pt planning d/c to SNF, will defer OT to next venue). Per SW Barnett Applebaum), pt planning d/c to SNF and has already been authorized by insurance. Will defer all further OT needs to next venue of care. Please re-consult if change in medical status occurs. Thank you for this referral.   Binnie Kand M.S., OTR/L Pager: [phone]  10/02/2015, 10:05 AM

## 2015-10-02 NOTE — Clinical Social Work Placement (Signed)
   CLINICAL SOCIAL WORK PLACEMENT  NOTE  Date:  10/02/2015  Patient Details  Name: MILDRED STOHLER MRN: [id number] Date of Birth: 1961-03-16  Clinical Social Work is seeking post-discharge placement for this patient at the [place] level of care (*CSW will initial, date and re-position this form in  chart as items are completed):      Patient/family provided with [place] Work Department's list of facilities offering this level of care within the geographic area requested by the patient (or if unable, by the patient's family).  Yes   Patient/family informed of their freedom to choose among providers that offer the needed level of care, that participate in Medicare, Medicaid or managed care program needed by the patient, have an available bed and are willing to accept the patient.  Yes   Patient/family informed of [facility]'s ownership interest in [facility] and [facility], as well as of the fact that they are under no obligation to receive care at these facilities.  PASRR submitted to EDS on 10/01/15     PASRR number received on 10/01/15     Existing PASRR number confirmed on       FL2 transmitted to all facilities in geographic area requested by pt/family on 10/01/15     FL2 transmitted to all facilities within larger geographic area on       Patient informed that his/her managed care company has contracts with or will negotiate with certain facilities, including the following:        Yes   Patient/family informed of bed offers received.  Patient chooses bed at [facility]     Physician recommends and patient chooses bed at      [facility] to be transferred to [name] on 10/02/15.  Patient to be transferred to facility by [facility]     Patient family notified on 10/02/15 of transfer.  Name of family member notified:  Patient updated     PHYSICIAN       Additional Comment:     _______________________________________________ Caroline Sauger, LCSW 10/02/2015, 2:41 PM

## 2015-10-02 NOTE — Progress Notes (Signed)
[facility] discharged home per MD order. Discharge instructions reviewed and discussed with patient. All questions and concerns answered. Copy of instructions and scripts given to patient. IV removed. Called [name] ([facility]) at [phone] three times in attempt to give report, no answer.  Patient escorted by [facility]. No distress noted upon discharge.   [name] 10/02/2015 4:41 PM

## 2015-10-02 NOTE — Progress Notes (Signed)
Physical Therapy Treatment Patient Details Name: Rose Long MRN: [id number] DOB: 02-Sep-1961 Today's Date: 10/02/2015    History of Present Illness 54 yo female with new TKA on LLE, with immobilizer until 10 SLR's.      PT Comments    Patient making slow progress toward mobility goals with increased independence for bed mobility and ability to ambulate 40 ft. Pt is hesitant to weight bear through L LE and is limited by pain. Led pt through exercises and pt tolerated them well. Current plan remains appropriate.  Follow Up Recommendations  SNF     Equipment Recommendations  Rolling walker with 5" wheels    Recommendations for Other Services Rehab consult     Precautions / Restrictions Precautions Precautions: Fall;Knee Precaution Booklet Issued: Yes (comment) Required Braces or Orthoses: Knee Immobilizer - Left Knee Immobilizer - Left: On when out of bed or walking Restrictions Weight Bearing Restrictions: Yes LLE Weight Bearing: Weight bearing as tolerated    Mobility  Bed Mobility Overal bed mobility: Needs Assistance Bed Mobility: Supine to Sit;Sit to Supine     Supine to sit: Supervision (HOB elevated; R LE assist to bring L LE to EOB) Sit to supine: Mod assist (bringing L LE onto bed)   General bed mobility comments: vc for technique  Transfers Overall transfer level: Needs assistance Equipment used: Rolling walker (2 wheeled);1 person hand held assist Transfers: Sit to/from Stand (from EOB and commode) Sit to Stand: Min assist;Min guard         General transfer comment: vc for hand placement and technique and min A for powering up from commode; min guard for safety  Ambulation/Gait Ambulation/Gait assistance: Min guard Ambulation Distance (Feet): 40 Feet Assistive device: Rolling walker (2 wheeled);1 person hand held assist Gait Pattern/deviations: Step-to pattern;Decreased stance time - left;Decreased step length - right;Antalgic Gait velocity:  reduced   General Gait Details: vc for upright posture and decreased leaning on RW; encouraged pt to WS L and bear more weight through L LE; pt with tendency to only use forefoot of L LE; very slow cadence   Stairs            Wheelchair Mobility    Modified Rankin (Stroke Patients Only)       Balance     Sitting balance-Leahy Scale: Good     Standing balance support: Bilateral upper extremity supported Standing balance-Leahy Scale: Fair                      Cognition Arousal/Alertness: Awake/alert Behavior During Therapy: WFL for tasks assessed/performed Overall Cognitive Status: Within Functional Limits for tasks assessed                      Exercises Total Joint Exercises Quad Sets: AROM;10 reps;Left Heel Slides: AROM;Left;10 reps Hip ABduction/ADduction: AROM;AAROM;10 reps;Left Straight Leg Raises: AROM;AAROM;Left;5 reps Goniometric ROM: 0-60    General Comments        Pertinent Vitals/Pain Pain Assessment: 0-10 Pain Score: 6  Pain Location: L knee Pain Descriptors / Indicators: Aching Pain Intervention(s): Limited activity within patient's tolerance;Monitored during session;RN gave pain meds during session    Home Living                      Prior Function            PT Goals (current goals can now be found in the care plan section) Acute Rehab PT Goals Patient Stated Goal:  to get to be independent and safe walking PT Goal Formulation: With patient Time For Goal Achievement: 10/15/15 Potential to Achieve Goals: Good Progress towards PT goals: Progressing toward goals    Frequency  7X/week    PT Plan Current plan remains appropriate    Co-evaluation             End of Session Equipment Utilized During Treatment: Gait belt Activity Tolerance: Patient tolerated treatment well Patient left: with call bell/phone within reach;in bed     Time: 1333-1405 PT Time Calculation (min) (ACUTE ONLY): 32  min  Charges:  $Gait Training: 8-22 mins $Therapeutic Exercise: 8-22 mins                    G Codes:      Salina April, PTA Pager: [phone]   10/02/2015, 2:15 PM

## 2015-10-02 NOTE — Clinical Social Work Note (Signed)
CSW received notification that [name] has received insurance Christella Scheuermann) authorization for SNF stay.  SNF will complete paperwork at bedside at 11:30am.  Transportation to be arranged between 12:30-1pm.    Disposition: [name] via [facility] 12:30-1p  Nonnie Done, [place] [phone]  [place] [id number]  Licensed Clinical Social Worker

## 2015-10-19 HISTORY — PX: REDUCTION MAMMAPLASTY: SUR839

## 2015-11-04 ENCOUNTER — Encounter: Payer: Self-pay | Admitting: Physical Therapy

## 2015-11-04 ENCOUNTER — Ambulatory Visit (INDEPENDENT_AMBULATORY_CARE_PROVIDER_SITE_OTHER): Payer: Managed Care, Other (non HMO) | Admitting: Physical Therapy

## 2015-11-04 DIAGNOSIS — R269 Unspecified abnormalities of gait and mobility: Secondary | ICD-10-CM

## 2015-11-04 DIAGNOSIS — M25662 Stiffness of left knee, not elsewhere classified: Secondary | ICD-10-CM

## 2015-11-04 DIAGNOSIS — R531 Weakness: Secondary | ICD-10-CM

## 2015-11-04 DIAGNOSIS — M25562 Pain in left knee: Secondary | ICD-10-CM

## 2015-11-04 DIAGNOSIS — R2240 Localized swelling, mass and lump, unspecified lower limb: Secondary | ICD-10-CM | POA: Diagnosis not present

## 2015-11-04 NOTE — Patient Instructions (Signed)
Balance: Unilateral   [place] [phone]    Attempt to balance on left leg, eyes open. Hold _as long as able___ seconds. Repeat __10__ times per set. Do __1__ sets per session. Do _3-4___ sessions per day. Perform exercise with eyes closed.  Knee Extension Mobilization: Hang (Prone)    With table supporting thighs, place _1-2__ pound weight on right ankle. Hold __2-5__ minutes. Repeat _1___ times per set. Do __1__ sets per session. Do __3-4__ sessions per day.   Strengthening: Quadriceps Set    Tighten muscles on top of thighs by pushing knees down into surface. Hold __5-10__ seconds. Watch for Lt knee cap pulling up and quad tightening.  Repeat _10___ times per set. Do __1__ sets per session. Do __3-4__ sessions per day.  Copyright  VHI. All rights reserved.

## 2015-11-04 NOTE — Therapy (Signed)
[place], [place], [address] Phone: [phone]   Fax:  [phone]  Physical Therapy Evaluation  Patient Details  Name: Rose Long MRN: [id number] Date of Birth: Feb 11, 1961 Referring Provider: Dr [place] Nakayama  Encounter Date: 11/04/2015      PT End of Session - 11/04/15 1243    Visit Number 1   Number of Visits 12   Date for PT Re-Evaluation 12/02/15   PT Start Time 1151   PT Stop Time 1256   PT Time Calculation (min) 65 min   Activity Tolerance Patient limited by pain      Past Medical History  Diagnosis Date  . Arthritis   . Obesity   . Anemia   . Simple ovarian cyst     Right  . Complication of anesthesia     PONV  . Family history of adverse reaction to anesthesia     pt's mother stopped breathing possibly due to sleep apnea    Past Surgical History  Procedure Laterality Date  . 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 Nasal sinus surgery  1999  . Breast surgery  1998    reduction  . Uterine ablation  2012  . Under arm skin removed    . Breast surgery  10/2013    Lift  . Abdominoplasty/panniculectomy    . Total knee arthroplasty Left 09/30/2015    Procedure: TOTAL KNEE ARTHROPLASTY;  Surgeon: [place] Nakayama, MD;  Location: [place] [facility] [facility];  Service: Orthopedics;  Laterality: Left;    There were no vitals filed for this visit.  Visit Diagnosis:  Stiffness of knee joint, left - Plan: PT plan of care cert/re-cert  Weakness generalized - Plan: PT plan of care cert/re-cert  Abnormality of gait - Plan: PT plan of care cert/re-cert  Localized swelling of lower leg - Plan: PT plan of care cert/re-cert  Left knee pain - Plan: PT plan of care cert/re-cert      Subjective Assessment - 11/04/15 1153    Subjective Pt had elective Lt TKA on 09/30/15, was in IP rehab for 5 days, then HHPT, there was a slight delay in starting this.  They finished up a week ago. Using a cane at all times.  HHPT worked on some stetching and sterngthening in the knee.    Pertinent History went back to work today. Working part time for 3 weeks. Pt was told that if her motion doesn't improve he will manipulate the knee.    How long can you sit comfortably? 20-30 min   How long can you stand comfortably? 10 min   How long can you walk comfortably? car to building and household ambulation only right now.    Patient Stated Goals get motion back so no need for manipulation, exercise again and decrease pain.    Currently in Pain? Yes   Pain Score 7    Pain Location Knee   Pain Orientation Left   Pain Descriptors / Indicators Throbbing;Tightness;Aching   Pain Type Surgical pain   Pain Onset More than a month ago   Pain Frequency Intermittent   Aggravating Factors  sitting prolonged   Pain Relieving Factors moving around and massage.             [facility] PT Assessment - 11/04/15 0001    Assessment   Medical Diagnosis Lt TKA    Referring Provider Dr [place] Nakayama   Onset Date/Surgical Date 09/29/16   Next MD Visit 11/19/15  Prior Therapy HHPT   Precautions   Precautions None   Restrictions   Weight Bearing Restrictions --  WBAT   Balance Screen   Has the patient fallen in the past 6 months No   Has the patient had a decrease in activity level because of a fear of falling?  No   Is the patient reluctant to leave their home because of a fear of falling?  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   Leisure exercise, hike   Observation/Other Assessments   Focus on Therapeutic Outcomes (FOTO)  65% limited   Observation/Other Assessments-Edema    Edema --  (+) in Lt knee and lower leg   Functional Tests   Functional tests Squat;Single leg  stance   Squat   Comments wt shifts to the Rt    Single Leg Stance   Comments Lt 4 sec   Posture/Postural Control   Posture Comments valgus Rt LE   ROM / Strength   AROM / PROM / Strength AROM;PROM;Strength   AROM   AROM Assessment Site Knee   Right/Left Knee Right;Left   Right Knee Flexion --   Left Knee Extension -2   Left Knee Flexion 88   PROM   PROM Assessment Site Knee   Right/Left Knee Left   Left Knee Extension -5   Left Knee Flexion 95   Strength   Strength Assessment Site Hip;Knee   Right/Left Hip Right;Left   Right Hip Flexion 5/5   Right Hip Extension 4/5   Right Hip ABduction 4+/5   Left Hip Flexion 4-/5  (+) quad lag.    Left Hip Extension 3/5   Left Hip ABduction 4+/5   Right/Left Knee Right;Left   Right Knee Flexion 5/5   Right Knee Extension 5/5   Left Knee Flexion 4/5   Left Knee Extension 3+/5   Flexibility   Soft Tissue Assessment /Muscle Length --  tight in Lt calf   Palpation   Patella mobility hypomobile   Palpation comment decreased scar mobility   Bed Mobility   Bed Mobility --  modified I, uses UE to assist Lt LE   Ambulation/Gait   Ambulation/Gait --  observed in the clinic   Assistive device Straight cane   Gait Pattern Decreased weight shift to left;Decreased hip/knee flexion - left;Decreased stance time - left;Decreased step length - left;Left flexed knee in stance;Antalgic;Decreased trunk rotation                   [facility] Adult PT Treatment/Exercise - 11/04/15 0001    Exercises   Exercises Knee/Hip   Knee/Hip Exercises: Aerobic   Nustep L1x5' for ROM   Knee/Hip Exercises: Standing   SLS at wall for assist   Knee/Hip Exercises: Supine   Quad Sets Strengthening;Both;5 reps   Knee/Hip Exercises: Prone   Other Prone Exercises prone knee ext stretch leg off EOB   Modalities   Modalities Vasopneumatic;Electrical Stimulation   Electrical Stimulation   Electrical Stimulation Location 20   Electrical Stimulation Action  ion repelling   Electrical Stimulation Parameters to tolerance   Electrical Stimulation Goals Edema   Vasopneumatic   Number Minutes Vasopneumatic  20 minutes   Vasopnuematic Location  Knee  Lt  Vasopneumatic Pressure Medium   Vasopneumatic Temperature  3*                PT Education - 11/04/15 1233    Education provided Yes   Education Details HEP   Person(s) Educated Patient   Methods Demonstration;Handout   Comprehension Returned demonstration             PT Long Term Goals - 11/04/15 1247    PT LONG TERM GOAL #1   Title Patient I in HEP 12/02/15   Time 4   Period Weeks   Status New   PT LONG TERM GOAL #2   Title increase Lt knee extension =/> 110 degrees to assit with stair climbing 12/02/15   Time 4   Period Weeks   Status New   PT LONG TERM GOAL #3   Title increase Lt knee extension =/< -1 degrees 12/02/15   Time 4   Period Weeks   Status New   PT LONG TERM GOAL #4   Title ambulate without an assistive device and minimal gait deviations 12/02/15   Time 4   Period Weeks   Status New   PT LONG TERM GOAL #5   Title Improve FOTO to </= 47% limitation 12/02/15   Time 4   Period Weeks   Status New   Additional Long Term Goals   Additional Long Term Goals Yes   PT LONG TERM GOAL #6   Title report pain decrease =/< 2/10 after a work day 12/02/15   Time 4   Period Weeks   Status New   PT LONG TERM GOAL #7   Title increase strength bilat LEs's =/< 4+/5 2/14/174   Time 4   Period Weeks   Status New               Plan - 11/04/15 1244    Clinical Impression Statement 55 yo female presents 5 wks s/p elective Lt TKA, she continues to have very limited ROM and MD may perform manipulation if not improved in 2 wks. She has significant gait deviations and a h/o back issues that we don't want to reoccur. LE weakness, decreased quad activiation and edema in the lower leg are also factors that need to be addressed.    Pt will benefit from skilled  therapeutic intervention in order to improve on the following deficits Abnormal gait;Decreased range of motion;Pain;Decreased scar mobility;Hypomobility;Decreased strength;Increased edema   Rehab Potential Excellent   PT Frequency 3x / week   PT Duration 4 weeks   PT Treatment/Interventions Ultrasound;Neuromuscular re-education;Scar mobilization;Biofeedback;Gait training;Patient/family education;Passive range of motion;Dry needling;Cryotherapy;Electrical Stimulation;Moist Heat;Therapeutic exercise;Manual techniques;Vasopneumatic Device   PT Next Visit Plan focus on knee ROM to avoid manipulation in 2 weeks.    Consulted and Agree with Plan of Care Patient         Problem List Patient Active Problem List   Diagnosis Date Noted  . Primary osteoarthritis of left knee 09/30/2015  . Primary osteoarthritis of knee 09/30/2015  . Primary osteoarthritis of left wrist 07/28/2015  . Olecranon bursitis of right elbow 06/16/2015  . Hemorrhoid 06/12/2014  . Absolute anemia 06/12/2014  . Abnormal facial hair 08/03/2013  . Trochanteric bursitis of both hips 06/12/2013  . Low back pain 05/21/2013  . Osteoarthritis of both knees 05/04/2013  . Metatarsalgia of left foot 05/04/2013  . Obesity 02/22/2012  . Migraine without aura 11/23/2011  . Chronic daily headache 11/23/2011  . Muscle spasm 11/23/2011  . Insomnia 11/23/2011  . RHEUMATOID  ARTHRITIS 08/15/2008    Jeral Pinch PT 11/04/2015, 12:53 PM  [facility] [place], [place], [address] Phone: [phone]   Fax:  [phone]  Name: Rose Long MRN: [id number] Date of Birth: November 07, 1960

## 2015-11-06 ENCOUNTER — Ambulatory Visit (INDEPENDENT_AMBULATORY_CARE_PROVIDER_SITE_OTHER): Payer: Managed Care, Other (non HMO) | Admitting: Physical Therapy

## 2015-11-06 ENCOUNTER — Encounter: Payer: Self-pay | Admitting: Physical Therapy

## 2015-11-06 DIAGNOSIS — R2240 Localized swelling, mass and lump, unspecified lower limb: Secondary | ICD-10-CM | POA: Diagnosis not present

## 2015-11-06 DIAGNOSIS — M25662 Stiffness of left knee, not elsewhere classified: Secondary | ICD-10-CM

## 2015-11-06 DIAGNOSIS — M25562 Pain in left knee: Secondary | ICD-10-CM

## 2015-11-06 DIAGNOSIS — R269 Unspecified abnormalities of gait and mobility: Secondary | ICD-10-CM

## 2015-11-06 DIAGNOSIS — R531 Weakness: Secondary | ICD-10-CM | POA: Diagnosis not present

## 2015-11-06 NOTE — Therapy (Signed)
[place], [place], [address] Phone: [phone]   Fax:  [phone]  Physical Therapy Treatment  Patient Details  Name: Rose Long MRN: [id number] Date of Birth: [birth year] Referring Provider: Dr [place] Nakayama  Encounter Date: 11/06/2015      PT End of Session - 11/06/15 1358    Visit Number 2   Number of Visits 12   Date for PT Re-Evaluation 12/02/15   PT Start Time [id number]   PT Stop Time 1505   PT Time Calculation (min) 67 min   Activity Tolerance Patient limited by pain      Past Medical History  Diagnosis Date  . Arthritis   . Obesity   . Anemia   . Simple ovarian cyst     Right  . Complication of anesthesia     PONV  . Family history of adverse reaction to anesthesia     pt's mother stopped breathing possibly due to sleep apnea    Past Surgical History  Procedure Laterality Date  . 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 Nasal sinus surgery  1999  . Breast surgery  1998    reduction  . Uterine ablation  2012  . Under arm skin removed    . Breast surgery  10/2013    Lift  . Abdominoplasty/panniculectomy    . Total knee arthroplasty Left 09/30/2015    Procedure: TOTAL KNEE ARTHROPLASTY;  Surgeon: [place] Nakayama, MD;  Location: [place];  Service: Orthopedics;  Laterality: Left;    There were no vitals filed for this visit.  Visit Diagnosis:  Stiffness of knee joint, left  Weakness generalized  Abnormality of gait  Localized swelling of lower leg  Left knee pain      Subjective Assessment - 11/06/15 1401    Subjective Pt reports that her Lt knee down into the foot has been sore since her last visit.    Currently in Pain? Yes   Pain Score 8    Pain Location Knee   Pain Orientation Left;Anterior   Pain Descriptors / Indicators Stabbing;Shooting   Pain Type Surgical pain   Pain Radiating Towards into the top of the of the foot   Pain Onset More than a month ago   Pain  Frequency Constant   Aggravating Factors  everything now   Pain Relieving Factors ice, massage                         [facility] Adult PT Treatment/Exercise - 11/06/15 0001    Knee/Hip Exercises: Aerobic   Stationary Bike L0, for ROM x5'  made a full revolution and had really bad knee pain   Knee/Hip Exercises: Standing   Other Standing Knee Exercises wt shifting, side/side, on ground and on 2' foam.    Other Standing Knee Exercises 2x10 sit/stand w/o UE support   Knee/Hip Exercises: Seated   Hamstring Curl Strengthening;Left;3 sets;10 reps  red band   Knee/Hip Exercises: Supine   Short Arc Quad Sets Strengthening;Left;3 sets;10 reps  2#   Bridges Limitations 3x10   Straight Leg Raises Strengthening;Left;15 reps  with strap assist PRN   Modalities   Modalities Cryotherapy;Electrical Stimulation;Moist Heat   Moist Heat Therapy   Number Minutes Moist Heat 15 Minutes   Moist Heat Location Ankle  Lt   Cryotherapy   Number Minutes Cryotherapy 15 Minutes   Cryotherapy Location Knee  Lt   Type of Cryotherapy Ice pack  patient did not care for vaso   Theme park manager Action ion repelling to Lt knee, premod to Lt ankle   Electrical Stimulation Parameters to tolerance   Electrical Stimulation Goals Edema;Pain   Manual Therapy   Manual Therapy Soft tissue mobilization;Joint mobilization   Joint Mobilization Lt ankle posterior talus, the stretching into DF.    Soft tissue mobilization Lt quad and ITB, able to bend to 96 degrees after, was 92 prior.                      PT Long Term Goals - 11/04/15 1247    PT LONG TERM GOAL #1   Title Patient I in HEP 12/02/15   Time 4   Period Weeks   Status New   PT LONG TERM GOAL #2   Title increase Lt knee extension =/> 110 degrees to assit with stair climbing 12/02/15   Time 4   Period Weeks   Status New   PT LONG TERM GOAL #3   Title increase  Lt knee extension =/< -1 degrees 12/02/15   Time 4   Period Weeks   Status New   PT LONG TERM GOAL #4   Title ambulate without an assistive device and minimal gait deviations 12/02/15   Time 4   Period Weeks   Status New   PT LONG TERM GOAL #5   Title Improve FOTO to </= 47% limitation 12/02/15   Time 4   Period Weeks   Status New   Additional Long Term Goals   Additional Long Term Goals Yes   PT LONG TERM GOAL #6   Title report pain decrease =/< 2/10 after a work day 12/02/15   Time 4   Period Weeks   Status New   PT LONG TERM GOAL #7   Title increase strength bilat LEs's =/< 4+/5 2/14/174   Time 4   Period Weeks   Status New               Plan - 11/06/15 1531    Clinical Impression Statement this is pts first full treatment, she had increased pain after her eval, her ankle is very sore and it is most likely because she is having a different type of strain on the ankle as her valgus has been fixed and she if having more dorsiflexion with walking.    Pt will benefit from skilled therapeutic intervention in order to improve on the following deficits Abnormal gait;Decreased range of motion;Pain;Decreased scar mobility;Hypomobility;Decreased strength;Increased edema   Rehab Potential Excellent   PT Frequency 3x / week   PT Duration 4 weeks   PT Treatment/Interventions Ultrasound;Neuromuscular re-education;Scar mobilization;Biofeedback;Gait training;Patient/family education;Passive range of motion;Dry needling;Cryotherapy;Electrical Stimulation;Moist Heat;Therapeutic exercise;Manual techniques;Vasopneumatic Device   PT Next Visit Plan focus on knee ROM to avoid manipulation in 2 weeks.    Consulted and Agree with Plan of Care Patient        Problem List Patient Active Problem List   Diagnosis Date Noted  . Primary osteoarthritis of left knee 09/30/2015  . Primary osteoarthritis of knee 09/30/2015  . Primary osteoarthritis of left wrist 07/28/2015  . Olecranon bursitis  of right elbow 06/16/2015  . Hemorrhoid 06/12/2014  . Absolute anemia 06/12/2014  . Abnormal facial hair 08/03/2013  . Trochanteric bursitis of both hips 06/12/2013  . Low back pain 05/21/2013  . Osteoarthritis of both knees 05/04/2013  . Metatarsalgia of left foot 05/04/2013  .  Obesity 02/22/2012  . Migraine without aura 11/23/2011  . Chronic daily headache 11/23/2011  . Muscle spasm 11/23/2011  . Insomnia 11/23/2011  . RHEUMATOID ARTHRITIS 08/15/2008    Jeral Pinch PT 11/06/2015, 3:36 PM  [facility] [place], [place], [address] Phone: [phone]   Fax:  [phone]  Name: Rose Long MRN: [id number] Date of Birth: 13-Jun-1961

## 2015-11-07 ENCOUNTER — Encounter: Payer: Self-pay | Admitting: Physical Therapy

## 2015-11-07 ENCOUNTER — Ambulatory Visit (INDEPENDENT_AMBULATORY_CARE_PROVIDER_SITE_OTHER): Payer: Managed Care, Other (non HMO) | Admitting: Physical Therapy

## 2015-11-07 DIAGNOSIS — R2240 Localized swelling, mass and lump, unspecified lower limb: Secondary | ICD-10-CM | POA: Diagnosis not present

## 2015-11-07 DIAGNOSIS — M25662 Stiffness of left knee, not elsewhere classified: Secondary | ICD-10-CM

## 2015-11-07 DIAGNOSIS — R531 Weakness: Secondary | ICD-10-CM | POA: Diagnosis not present

## 2015-11-07 DIAGNOSIS — R269 Unspecified abnormalities of gait and mobility: Secondary | ICD-10-CM

## 2015-11-07 DIAGNOSIS — M25562 Pain in left knee: Secondary | ICD-10-CM

## 2015-11-07 NOTE — Therapy (Signed)
[place], [place], [address] Phone: [phone]   Fax:  [phone]  Physical Therapy Treatment  Patient Details  Name: Rose Long MRN: [id number] Date of Birth: 03/06/1961 Referring Provider: Dr [place] Nakayama  Encounter Date: 11/07/2015      PT End of Session - 11/07/15 1334    Visit Number 3   Number of Visits 12   Date for PT Re-Evaluation 12/02/15   PT Start Time [id number]   PT Stop Time 1426   PT Time Calculation (min) 53 min   Activity Tolerance Patient limited by pain      Past Medical History  Diagnosis Date  . Arthritis   . Obesity   . Anemia   . Simple ovarian cyst     Right  . Complication of anesthesia     PONV  . Family history of adverse reaction to anesthesia     pt's mother stopped breathing possibly due to sleep apnea    Past Surgical History  Procedure Laterality Date  . 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 Nasal sinus surgery  1999  . Breast surgery  1998    reduction  . Uterine ablation  2012  . Under arm skin removed    . Breast surgery  10/2013    Lift  . Abdominoplasty/panniculectomy    . Total knee arthroplasty Left 09/30/2015    Procedure: TOTAL KNEE ARTHROPLASTY;  Surgeon: [place] Nakayama, MD;  Location: [place];  Service: Orthopedics;  Laterality: Left;    There were no vitals filed for this visit.  Visit Diagnosis:  Stiffness of knee joint, left  Weakness generalized  Abnormality of gait  Localized swelling of lower leg  Left knee pain      Subjective Assessment - 11/07/15 1344    Subjective Pt is still very sore, it is a little bit better today   Currently in Pain? Yes   Pain Score 7    Pain Location Knee   Pain Orientation Left;Anterior   Pain Type Surgical pain                         [facility] Adult PT Treatment/Exercise - 11/07/15 0001    Exercises   Exercises Knee/Hip   Knee/Hip Exercises: Aerobic   Stationary Bike  L0, for ROM making full revolutions.    Knee/Hip Exercises: Standing   Lateral Step Up Left;15 reps;Step Height: 6"   Forward Step Up 15 reps;Step Height: 6";Left   Step Down Step Height: 4";Right  then stepped up BWD with Lt.    Knee/Hip Exercises: Seated   Hamstring Curl Left;Strengthening;3 sets  1#, attempted 3# however had HS spasm   Knee/Hip Exercises: Supine   Short Arc Quad Sets Strengthening;Left;3 sets;10 reps  3#   Short Arc Quad Sets Limitations then  5x10 sec holds without wt.    Straight Leg Raise with External Rotation Left;Strengthening;10 reps  difficulty and painful for pt   Modalities   Modalities Cryotherapy;Electrical Stimulation;Moist Heat   Moist Heat Therapy   Number Minutes Moist Heat 15 Minutes   Moist Heat Location Ankle   Cryotherapy   Number Minutes Cryotherapy 15 Minutes   Cryotherapy Location Knee   Type of Cryotherapy Ice pack   Electrical Stimulation   Electrical Stimulation Location 15   Electrical Stimulation Action ion repelling   Electrical Stimulation Parameters to tolerance   Electrical Stimulation Goals Edema;Pain   Manual Therapy  Manual Therapy Soft tissue mobilization;Joint mobilization   Joint Mobilization Lt ankle posterior talus, the stretching into DF.    Soft tissue mobilization Lt hamstring and gastroc                     PT Long Term Goals - 11/04/15 1247    PT LONG TERM GOAL #1   Title Patient I in HEP 12/02/15   Time 4   Period Weeks   Status New   PT LONG TERM GOAL #2   Title increase Lt knee extension =/> 110 degrees to assit with stair climbing 12/02/15   Time 4   Period Weeks   Status New   PT LONG TERM GOAL #3   Title increase Lt knee extension =/< -1 degrees 12/02/15   Time 4   Period Weeks   Status New   PT LONG TERM GOAL #4   Title ambulate without an assistive device and minimal gait deviations 12/02/15   Time 4   Period Weeks   Status New   PT LONG TERM GOAL #5   Title Improve FOTO to </=  47% limitation 12/02/15   Time 4   Period Weeks   Status New   Additional Long Term Goals   Additional Long Term Goals Yes   PT LONG TERM GOAL #6   Title report pain decrease =/< 2/10 after a work day 12/02/15   Time 4   Period Weeks   Status New   PT LONG TERM GOAL #7   Title increase strength bilat LEs's =/< 4+/5 2/14/174   Time 4   Period Weeks   Status New               Plan - 11/07/15 1519    Clinical Impression Statement Pt tolerated treatment better today, able to achieve almost full knee extension. She was also able to perform some weight bearing ther ex    Pt will benefit from skilled therapeutic intervention in order to improve on the following deficits Abnormal gait;Decreased range of motion;Pain;Decreased scar mobility;Hypomobility;Decreased strength;Increased edema   Rehab Potential Excellent   PT Frequency 3x / week   PT Duration 4 weeks   PT Treatment/Interventions Ultrasound;Neuromuscular re-education;Scar mobilization;Biofeedback;Gait training;Patient/family education;Passive range of motion;Dry needling;Cryotherapy;Electrical Stimulation;Moist Heat;Therapeutic exercise;Manual techniques;Vasopneumatic Device   PT Next Visit Plan focus on knee ROM to avoid manipulation in 2 weeks.    Consulted and Agree with Plan of Care Patient        Problem List Patient Active Problem List   Diagnosis Date Noted  . Primary osteoarthritis of left knee 09/30/2015  . Primary osteoarthritis of knee 09/30/2015  . Primary osteoarthritis of left wrist 07/28/2015  . Olecranon bursitis of right elbow 06/16/2015  . Hemorrhoid 06/12/2014  . Absolute anemia 06/12/2014  . Abnormal facial hair 08/03/2013  . Trochanteric bursitis of both hips 06/12/2013  . Low back pain 05/21/2013  . Osteoarthritis of both knees 05/04/2013  . Metatarsalgia of left foot 05/04/2013  . Obesity 02/22/2012  . Migraine without aura 11/23/2011  . Chronic daily headache 11/23/2011  . Muscle spasm  11/23/2011  . Insomnia 11/23/2011  . RHEUMATOID ARTHRITIS 08/15/2008    Jamerion Cabello,SUE 11/07/2015, 3:23 PM  [facility] [place], [place], [address] Phone: [phone]   Fax:  [phone]  Name: Rose Long MRN: [id number] Date of Birth: 01-09-1961

## 2015-11-10 ENCOUNTER — Ambulatory Visit (INDEPENDENT_AMBULATORY_CARE_PROVIDER_SITE_OTHER): Payer: Managed Care, Other (non HMO) | Admitting: Physical Therapy

## 2015-11-10 DIAGNOSIS — R269 Unspecified abnormalities of gait and mobility: Secondary | ICD-10-CM | POA: Diagnosis not present

## 2015-11-10 DIAGNOSIS — R2240 Localized swelling, mass and lump, unspecified lower limb: Secondary | ICD-10-CM

## 2015-11-10 DIAGNOSIS — M25662 Stiffness of left knee, not elsewhere classified: Secondary | ICD-10-CM | POA: Diagnosis not present

## 2015-11-10 DIAGNOSIS — R531 Weakness: Secondary | ICD-10-CM | POA: Diagnosis not present

## 2015-11-10 DIAGNOSIS — M25562 Pain in left knee: Secondary | ICD-10-CM

## 2015-11-10 NOTE — Therapy (Signed)
[place], [place], [address] Phone: [phone]   Fax:  [phone]  Physical Therapy Treatment  Patient Details  Name: Rose Long MRN: [id number] Date of Birth: [birth year] Referring Provider: Dr Melrose Nakayama  Encounter Date: 11/10/2015      PT End of Session - 11/10/15 1401    Visit Number 4   Number of Visits 12   Date for PT Re-Evaluation 12/02/15   PT Start Time 1401   PT Stop Time 1504   PT Time Calculation (min) 63 min   Activity Tolerance Patient limited by pain      Past Medical History  Diagnosis Date  . Arthritis   . Obesity   . Anemia   . Simple ovarian cyst     Right  . Complication of anesthesia     PONV  . Family history of adverse reaction to anesthesia     pt's mother stopped breathing possibly due to sleep apnea    Past Surgical History  Procedure Laterality Date  . 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 Nasal sinus surgery  1999  . Breast surgery  1998    reduction  . Uterine ablation  2012  . Under arm skin removed    . Breast surgery  10/2013    Lift  . Abdominoplasty/panniculectomy    . Total knee arthroplasty Left 09/30/2015    Procedure: TOTAL KNEE ARTHROPLASTY;  Surgeon: Melrose Nakayama, MD;  Location: [place];  Service: Orthopedics;  Laterality: Left;    There were no vitals filed for this visit.  Visit Diagnosis:  Stiffness of knee joint, left  Weakness generalized  Abnormality of gait  Localized swelling of lower leg  Left knee pain      Subjective Assessment - 11/10/15 1401    Subjective Pt feels terrible, can't take medicine while she is working so she is very sore.    Currently in Pain? Yes   Pain Score 8    Pain Location Knee   Pain Orientation Left;Anterior   Pain Descriptors / Indicators Sharp;Shooting   Pain Type Surgical pain   Pain Onset More than a month ago   Pain Frequency Constant   Aggravating Factors  everything still     Pain Relieving Factors ice, massage            [facility] PT Assessment - 11/10/15 0001    Assessment   Medical Diagnosis Lt TKA    AROM   Left Knee Flexion 96                     [facility] Adult PT Treatment/Exercise - 11/10/15 0001    Knee/Hip Exercises: Stretches   Sports administrator Left;3 reps  45 sec prone with strap   Knee/Hip Exercises: Aerobic   Stationary Bike L1 x 5'   Knee/Hip Exercises: Standing   Other Standing Knee Exercises 3x10 stretching into knee flex with foot on top step.    Modalities   Modalities Electrical Stimulation;Moist Heat   Moist Heat Therapy   Number Minutes Moist Heat 15 Minutes   Moist Heat Location --  Lt ant ankle/shin and quad (not knee)   Acupuncturist Location 15   Electrical Stimulation Action ion repelling   Electrical Stimulation Parameters to tolerance   Electrical Stimulation Goals Tone;Edema;Pain   Manual Therapy   Joint Mobilization Lt knee flexion mobs in supine grade III   Soft tissue mobilization Lt quad  and anterior tib          Trigger Point Dry Needling - 11/10/15 1452    Consent Given? Yes   Education Handout Provided Yes   Muscles Treated Lower Body Quadriceps;Tibialis anterior   Quadriceps Response Twitch response elicited;Palpable increased muscle length  used stim on lateralis   Tibialis Anterior Response Twitch response elicited;Palpable increased muscle length              PT Education - 11/10/15 1451    Education provided Yes   Education Details TDN   Person(s) Educated Patient   Methods Explanation;Handout   Comprehension Verbalized understanding             PT Long Term Goals - 11/10/15 1459    PT LONG TERM GOAL #1   Title Patient I in HEP 12/02/15   Status On-going   PT LONG TERM GOAL #2   Title increase Lt knee extension =/> 110 degrees to assit with stair climbing 12/02/15   Status On-going   PT LONG TERM GOAL #3   Title increase Lt knee  extension =/< -1 degrees 12/02/15   Status On-going   PT LONG TERM GOAL #4   Title ambulate without an assistive device and minimal gait deviations 12/02/15   Status On-going   PT LONG TERM GOAL #5   Title Improve FOTO to </= 47% limitation 12/02/15   Status On-going               Plan - 11/10/15 1455    Clinical Impression Statement Pt with slow improvement in Lt knee flexion.  She continues to have a lot of edema and pain in the knee.  She is limited in using pain medication to control as she has to work and can't take it at work.  TDN was performed to the tightness in her Lt mid and lateral quad and anterior tib. with good response.    Pt will benefit from skilled therapeutic intervention in order to improve on the following deficits Abnormal gait;Decreased range of motion;Pain;Decreased scar mobility;Hypomobility;Decreased strength;Increased edema   Rehab Potential Excellent   PT Frequency 3x / week   PT Duration 4 weeks   PT Treatment/Interventions Ultrasound;Neuromuscular re-education;Scar mobilization;Biofeedback;Gait training;Patient/family education;Passive range of motion;Dry needling;Cryotherapy;Electrical Stimulation;Moist Heat;Therapeutic exercise;Manual techniques;Vasopneumatic Device   PT Next Visit Plan focus on knee ROM to avoid manipulation in 2 weeks.    Consulted and Agree with Plan of Care Patient        Problem List Patient Active Problem List   Diagnosis Date Noted  . Primary osteoarthritis of left knee 09/30/2015  . Primary osteoarthritis of knee 09/30/2015  . Primary osteoarthritis of left wrist 07/28/2015  . Olecranon bursitis of right elbow 06/16/2015  . Hemorrhoid 06/12/2014  . Absolute anemia 06/12/2014  . Abnormal facial hair 08/03/2013  . Trochanteric bursitis of both hips 06/12/2013  . Low back pain 05/21/2013  . Osteoarthritis of both knees 05/04/2013  . Metatarsalgia of left foot 05/04/2013  . Obesity 02/22/2012  . Migraine without aura  11/23/2011  . Chronic daily headache 11/23/2011  . Muscle spasm 11/23/2011  . Insomnia 11/23/2011  . RHEUMATOID ARTHRITIS 08/15/2008    Jeral Pinch PT 11/10/2015, 2:59 PM  [facility] [place], [place], [address] Phone: [phone]   Fax:  [phone]  Name: RASHAD MEDDINGS MRN: [id number] Date of Birth: 21-Aug-1961

## 2015-11-10 NOTE — Patient Instructions (Signed)

## 2015-11-12 ENCOUNTER — Ambulatory Visit (INDEPENDENT_AMBULATORY_CARE_PROVIDER_SITE_OTHER): Payer: Managed Care, Other (non HMO) | Admitting: Physical Therapy

## 2015-11-12 DIAGNOSIS — R2240 Localized swelling, mass and lump, unspecified lower limb: Secondary | ICD-10-CM

## 2015-11-12 DIAGNOSIS — R269 Unspecified abnormalities of gait and mobility: Secondary | ICD-10-CM

## 2015-11-12 DIAGNOSIS — M25662 Stiffness of left knee, not elsewhere classified: Secondary | ICD-10-CM

## 2015-11-12 DIAGNOSIS — M25562 Pain in left knee: Secondary | ICD-10-CM

## 2015-11-12 DIAGNOSIS — R531 Weakness: Secondary | ICD-10-CM

## 2015-11-12 NOTE — Therapy (Signed)
[place], [place], [address] Phone: [phone]   Fax:  [phone]  Physical Therapy Treatment  Patient Details  Name: Rose Long MRN: [id number] Date of Birth: Mar 13, 1961 Referring Provider: Dr Melrose Nakayama  Encounter Date: 11/12/2015      PT End of Session - 11/12/15 1433    Visit Number 5   Number of Visits 12   Date for PT Re-Evaluation 12/02/15   PT Start Time 1427   PT Stop Time 1532   PT Time Calculation (min) 65 min   Activity Tolerance Patient tolerated treatment well      Past Medical History  Diagnosis Date  . Arthritis   . Obesity   . Anemia   . Simple ovarian cyst     Right  . Complication of anesthesia     PONV  . Family history of adverse reaction to anesthesia     pt's mother stopped breathing possibly due to sleep apnea    Past Surgical History  Procedure Laterality Date  . 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 Nasal sinus surgery  1999  . Breast surgery  1998    reduction  . Uterine ablation  2012  . Under arm skin removed    . Breast surgery  10/2013    Lift  . Abdominoplasty/panniculectomy    . Total knee arthroplasty Left 09/30/2015    Procedure: TOTAL KNEE ARTHROPLASTY;  Surgeon: Melrose Nakayama, MD;  Location: [place];  Service: Orthopedics;  Laterality: Left;    There were no vitals filed for this visit.  Visit Diagnosis:  Stiffness of knee joint, left  Weakness generalized  Abnormality of gait  Localized swelling of lower leg  Left knee pain      Subjective Assessment - 11/12/15 1433    Subjective Pt reports she was able to bring her heel back a little more in her car after the TDN, she is also using the gliding chair more .    Currently in Pain? Yes   Pain Score 6    Pain Location Knee   Pain Orientation Left;Anterior   Pain Descriptors / Indicators Shooting   Pain Type Surgical pain            [facility] PT Assessment - 11/12/15 0001     AROM   Left Knee Flexion 92   PROM   Left Knee Flexion 104                     [facility] Adult PT Treatment/Exercise - 11/12/15 0001    Knee/Hip Exercises: Aerobic   Stationary Bike L0 for ROM, full revolutions. x7'   Knee/Hip Exercises: Standing   Hip Extension Stengthening;Left;20 reps   Other Standing Knee Exercises 3x10 stretching into knee flex with foot on 13" step.    Other Standing Knee Exercises 20reps HS curls   Knee/Hip Exercises: Seated   Hamstring Curl 20 reps;Left  then with scooting bottom FWD   Knee/Hip Exercises: Supine   Short Arc Quad Sets Strengthening;Left;3 sets;10 reps  1.5 # for gentle motion with strength   Modalities   Modalities Electrical Stimulation;Moist Heat;Cryotherapy   Moist Heat Therapy   Number Minutes Moist Heat 15 Minutes   Moist Heat Location Ankle   Cryotherapy   Number Minutes Cryotherapy 15 Minutes   Cryotherapy Location Knee   Type of Cryotherapy Ice pack   Electrical Stimulation   Electrical Stimulation Location 15   Electrical Stimulation Action ion  repellling   Electrical Stimulation Parameters to tolerance   Electrical Stimulation Goals Edema;Pain   Manual Therapy   Manual Therapy Soft tissue mobilization;Joint mobilization   Joint Mobilization Lt ankle post talus grade III, metatarsal grade III   Soft tissue mobilization Lt post tib and ant tib          Trigger Point Dry Needling - 11/12/15 1506    Consent Given? Yes   Education Handout Provided No   Muscles Treated Lower Body --  posterior tib Lt with good twitch response                   PT Long Term Goals - 11/10/15 1459    PT LONG TERM GOAL #1   Title Patient I in HEP 12/02/15   Status On-going   PT LONG TERM GOAL #2   Title increase Lt knee extension =/> 110 degrees to assit with stair climbing 12/02/15   Status On-going   PT LONG TERM GOAL #3   Title increase Lt knee extension =/< -1 degrees 12/02/15   Status On-going   PT LONG TERM  GOAL #4   Title ambulate without an assistive device and minimal gait deviations 12/02/15   Status On-going   PT LONG TERM GOAL #5   Title Improve FOTO to </= 47% limitation 12/02/15   Status On-going               Plan - 11/12/15 1658    Clinical Impression Statement Rose Long continues to have slow improvement in knee ROM, pain and edema are her biggest limiting factors.  She is able to walk more upright with cueing and performing heel toe gait better.    Pt will benefit from skilled therapeutic intervention in order to improve on the following deficits Abnormal gait;Decreased range of motion;Pain;Decreased scar mobility;Hypomobility;Decreased strength;Increased edema   Rehab Potential Excellent   PT Frequency 3x / week   PT Duration 4 weeks   PT Treatment/Interventions Ultrasound;Neuromuscular re-education;Scar mobilization;Biofeedback;Gait training;Patient/family education;Passive range of motion;Dry needling;Cryotherapy;Electrical Stimulation;Moist Heat;Therapeutic exercise;Manual techniques;Vasopneumatic Device   PT Next Visit Plan focus on knee ROM to avoid manipulation in 2 weeks.    Consulted and Agree with Plan of Care Patient        Problem List Patient Active Problem List   Diagnosis Date Noted  . Primary osteoarthritis of left knee 09/30/2015  . Primary osteoarthritis of knee 09/30/2015  . Primary osteoarthritis of left wrist 07/28/2015  . Olecranon bursitis of right elbow 06/16/2015  . Hemorrhoid 06/12/2014  . Absolute anemia 06/12/2014  . Abnormal facial hair 08/03/2013  . Trochanteric bursitis of both hips 06/12/2013  . Low back pain 05/21/2013  . Osteoarthritis of both knees 05/04/2013  . Metatarsalgia of left foot 05/04/2013  . Obesity 02/22/2012  . Migraine without aura 11/23/2011  . Chronic daily headache 11/23/2011  . Muscle spasm 11/23/2011  . Insomnia 11/23/2011  . RHEUMATOID ARTHRITIS 08/15/2008    Jeral Pinch PT 11/12/2015, 5:01 PM  [facility] [place], [place], [address] Phone: [phone]   Fax:  [phone]  Name: Rose Long MRN: [id number] Date of Birth: [birth year]

## 2015-11-14 ENCOUNTER — Ambulatory Visit (INDEPENDENT_AMBULATORY_CARE_PROVIDER_SITE_OTHER): Payer: Managed Care, Other (non HMO) | Admitting: Physical Therapy

## 2015-11-14 DIAGNOSIS — R269 Unspecified abnormalities of gait and mobility: Secondary | ICD-10-CM | POA: Diagnosis not present

## 2015-11-14 DIAGNOSIS — R531 Weakness: Secondary | ICD-10-CM | POA: Diagnosis not present

## 2015-11-14 DIAGNOSIS — R2240 Localized swelling, mass and lump, unspecified lower limb: Secondary | ICD-10-CM | POA: Diagnosis not present

## 2015-11-14 DIAGNOSIS — M25562 Pain in left knee: Secondary | ICD-10-CM

## 2015-11-14 DIAGNOSIS — M25662 Stiffness of left knee, not elsewhere classified: Secondary | ICD-10-CM

## 2015-11-14 NOTE — Therapy (Signed)
[place], [place], [address] Phone: [phone]   Fax:  [phone]  Physical Therapy Treatment  Patient Details  Name: Rose Long MRN: [id number] Date of Birth: May 02, 1961 Referring Provider: Dr Melrose Nakayama  Encounter Date: 11/14/2015      PT End of Session - 11/14/15 1451    Visit Number 6   Number of Visits 12   Date for PT Re-Evaluation 12/02/15   PT Start Time [id number]   PT Stop Time [id number]   PT Time Calculation (min) 63 min   Activity Tolerance Patient limited by pain      Past Medical History  Diagnosis Date  . Arthritis   . Obesity   . Anemia   . Simple ovarian cyst     Right  . Complication of anesthesia     PONV  . Family history of adverse reaction to anesthesia     pt's mother stopped breathing possibly due to sleep apnea    Past Surgical History  Procedure Laterality Date  . 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 Nasal sinus surgery  1999  . Breast surgery  1998    reduction  . Uterine ablation  2012  . Under arm skin removed    . Breast surgery  10/2013    Lift  . Abdominoplasty/panniculectomy    . Total knee arthroplasty Left 09/30/2015    Procedure: TOTAL KNEE ARTHROPLASTY;  Surgeon: Melrose Nakayama, MD;  Location: [place];  Service: Orthopedics;  Laterality: Left;    There were no vitals filed for this visit.  Visit Diagnosis:  Stiffness of knee joint, left  Weakness generalized  Abnormality of gait  Localized swelling of lower leg  Left knee pain      Subjective Assessment - 11/14/15 1626    Subjective Pt reports no new changes since last visit.  Pt voices concern over possible manipulation if she is unable to improve knee ROM.     Currently in Pain? Yes   Pain Score 4   took pain medicine prior to therapy   Pain Location Knee   Pain Orientation Left;Anterior   Pain Descriptors / Indicators Shooting;Sharp   Aggravating Factors  movement of knee   Pain Relieving Factors ice, massage.             [facility] PT Assessment - 11/14/15 0001    Assessment   Medical Diagnosis Lt TKA    Onset Date/Surgical Date 09/29/16   Hand Dominance Left   Next MD Visit 11/19/15   AROM   AROM Assessment Site Knee   Right/Left Knee Left   Left Knee Extension -4   Left Knee Flexion 95  100 deg with Cherlynn June Adult PT Treatment/Exercise - 11/14/15 0001    Knee/Hip Exercises: Stretches   Quad Stretch Left;3 reps  45 sec prone with strap   Knee/Hip Exercises: Aerobic   Stationary Bike L0 for ROM, full revolutions. x 5 min   Knee/Hip Exercises: Standing   Other Standing Knee Exercises 3x10 stretching into knee flex with foot on 13" step, followed by knee ext hamstring stretch.    Knee/Hip Exercises: Sidelying   Hip ABduction Strengthening;Left;2 sets;10 reps   Knee/Hip Exercises: Prone   Hamstring Curl 2 sets;10 reps   Prone Knee Hang 1 minute  2 reps   Other Prone Exercises Prone TKE x 3 sec hold x 12 reps   Modalities   Modalities  Electrical Stimulation;Cryotherapy   Moist Heat Therapy   Number Minutes Moist Heat 15 Minutes   Moist Heat Location Ankle   Cryotherapy   Number Minutes Cryotherapy 15 Minutes   Cryotherapy Location Knee   Type of Cryotherapy Ice pack   Electrical Stimulation   Electrical Stimulation Location 15   Electrical Stimulation Action ion repelling    Electrical Stimulation Parameters to tolerance    Electrical Stimulation Goals Edema;Tone   Manual Therapy   Manual Therapy Joint mobilization;Passive ROM;Soft tissue mobilization   Joint Mobilization Lt ankle post talus grade III, metatarsal grade III; patella mobilizations sup/inf    Soft tissue mobilization Edge tool assistance to Lt quad in sitting to decrease fascial restrictions and pain.    Passive ROM into Lt knee ext to tolerance                 PT Education - 11/14/15 1625    Education provided Yes   Education Details Prone knee hang,  prone hamstring curls (no weight yet)   Person(s) Educated Patient   Methods Explanation;Demonstration   Comprehension Returned demonstration;Verbalized understanding             PT Long Term Goals - 11/10/15 1459    PT LONG TERM GOAL #1   Title Patient I in HEP 12/02/15   Status On-going   PT LONG TERM GOAL #2   Title increase Lt knee extension =/> 110 degrees to assit with stair climbing 12/02/15   Status On-going   PT LONG TERM GOAL #3   Title increase Lt knee extension =/< -1 degrees 12/02/15   Status On-going   PT LONG TERM GOAL #4   Title ambulate without an assistive device and minimal gait deviations 12/02/15   Status On-going   PT LONG TERM GOAL #5   Title Improve FOTO to </= 47% limitation 12/02/15   Status On-going               Plan - 11/14/15 1524    Clinical Impression Statement Pt demonstrated limited change in Lt knee ROM this visit.  Pt sensitive to manual therapy (passive ROM, soft tissue and patella mobilizations) to Lt knee and surrounding tissue. Pt making gradual progress towards goals.   Pt will benefit from skilled therapeutic intervention in order to improve on the following deficits Abnormal gait;Decreased range of motion;Pain;Decreased scar mobility;Hypomobility;Decreased strength;Increased edema   Rehab Potential Excellent   PT Frequency 3x / week   PT Duration 4 weeks   PT Treatment/Interventions Ultrasound;Neuromuscular re-education;Scar mobilization;Biofeedback;Gait training;Patient/family education;Passive range of motion;Dry needling;Cryotherapy;Electrical Stimulation;Moist Heat;Therapeutic exercise;Manual techniques;Vasopneumatic Device   PT Next Visit Plan focus on knee ROM to avoid manipulation in 2 wks.     Consulted and Agree with Plan of Care Patient        Problem List Patient Active Problem List   Diagnosis Date Noted  . Primary osteoarthritis of left knee 09/30/2015  . Primary osteoarthritis of knee 09/30/2015  . Primary  osteoarthritis of left wrist 07/28/2015  . Olecranon bursitis of right elbow 06/16/2015  . Hemorrhoid 06/12/2014  . Absolute anemia 06/12/2014  . Abnormal facial hair 08/03/2013  . Trochanteric bursitis of both hips 06/12/2013  . Low back pain 05/21/2013  . Osteoarthritis of both knees 05/04/2013  . Metatarsalgia of left foot 05/04/2013  . Obesity 02/22/2012  . Migraine without aura 11/23/2011  . Chronic daily headache 11/23/2011  . Muscle spasm 11/23/2011  . Insomnia 11/23/2011  . RHEUMATOID ARTHRITIS 08/15/2008    Anderson Malta  Osborn, PTA 11/14/2015 4:36 PM  [place], [place], [address] Phone: [phone]   Fax:  [phone]  Name: Rose Long MRN: [id number] Date of Birth: 1961/05/06

## 2015-11-17 ENCOUNTER — Ambulatory Visit (INDEPENDENT_AMBULATORY_CARE_PROVIDER_SITE_OTHER): Payer: Managed Care, Other (non HMO) | Admitting: Physical Therapy

## 2015-11-17 DIAGNOSIS — R531 Weakness: Secondary | ICD-10-CM

## 2015-11-17 DIAGNOSIS — M25562 Pain in left knee: Secondary | ICD-10-CM

## 2015-11-17 DIAGNOSIS — R269 Unspecified abnormalities of gait and mobility: Secondary | ICD-10-CM

## 2015-11-17 DIAGNOSIS — M25662 Stiffness of left knee, not elsewhere classified: Secondary | ICD-10-CM | POA: Diagnosis not present

## 2015-11-17 DIAGNOSIS — R2240 Localized swelling, mass and lump, unspecified lower limb: Secondary | ICD-10-CM | POA: Diagnosis not present

## 2015-11-17 NOTE — Therapy (Signed)
[place], [place], [address] Phone: [phone]   Fax:  [phone]  Physical Therapy Treatment  Patient Details  Name: Rose Long MRN: [id number] Date of Birth: 1961-04-07 Referring Provider: Dr Melrose Nakayama  Encounter Date: 11/17/2015      PT End of Session - 11/17/15 1437    Visit Number 7   Number of Visits 12   Date for PT Re-Evaluation 12/02/15   PT Start Time 1438  pt arrived late   PT Stop Time 1532   PT Time Calculation (min) 54 min   Activity Tolerance Patient limited by pain      Past Medical History  Diagnosis Date  . Arthritis   . Obesity   . Anemia   . Simple ovarian cyst     Right  . Complication of anesthesia     PONV  . Family history of adverse reaction to anesthesia     pt's mother stopped breathing possibly due to sleep apnea    Past Surgical History  Procedure Laterality Date  . 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 Nasal sinus surgery  1999  . Breast surgery  1998    reduction  . Uterine ablation  2012  . Under arm skin removed    . Breast surgery  10/2013    Lift  . Abdominoplasty/panniculectomy    . Total knee arthroplasty Left 09/30/2015    Procedure: TOTAL KNEE ARTHROPLASTY;  Surgeon: Melrose Nakayama, MD;  Location: [facility];  Service: Orthopedics;  Laterality: Left;    There were no vitals filed for this visit.  Visit Diagnosis:  Stiffness of knee joint, left  Weakness generalized  Abnormality of gait  Localized swelling of lower leg  Left knee pain      Subjective Assessment - 11/17/15 1443    Subjective "I think I overdid it over the weekend; lots of standing and walking". Pt reports lots of stiffness in Lt knee today.    Patient Stated Goals get motion back so no need for manipulation, exercise again and decrease pain.    Currently in Pain? Yes   Pain Score 7    Pain Location Knee   Pain Orientation Left;Anterior   Pain Descriptors /  Indicators Stabbing   Aggravating Factors  standing, any movement of knee    Pain Relieving Factors ice, massage.             [facility] PT Assessment - 11/17/15 0001    Assessment   Medical Diagnosis Lt TKA    Onset Date/Surgical Date 09/29/16   Hand Dominance Left   Next MD Visit 11/19/15   AROM   AROM Assessment Site Knee   Right/Left Knee Left   Left Knee Extension -6   Left Knee Flexion 96                     [facility] Adult PT Treatment/Exercise - 11/17/15 0001    Ambulation/Gait   Assistive device Straight cane   Gait Pattern Decreased arm swing - left;Decreased stance time - right;Decreased dorsiflexion - left;Antalgic   Pre-Gait Activities knee flex/ext in tandem stance prior to gait trials.     Gait Comments VC for decreased step length Lt, straighten toes of Lt foot to neutral ( as opposed to ER), and allowing knee flexion of Lt knee during swing through.   Improved gait quality with repetition and VC.     Knee/Hip Exercises: Public affairs consultant  Left;3 reps  45 sec prone with strap   Gastroc Stretch Left;2 reps;30 seconds   Soleus Stretch Left;2 reps;30 seconds   Knee/Hip Exercises: Aerobic   Stationary Bike L1 x 5'   Knee/Hip Exercises: Standing   Other Standing Knee Exercises 3x10 stretching into knee flex with foot on 13" step, followed by hamstring stretch after each repetition   Knee/Hip Exercises: Seated   Other Seated Knee/Hip Exercises seated scoots to Increase Lt knee ROM x 8 reps   Knee/Hip Exercises: Supine   Heel Slides Strengthening;Left;AAROM;1 set;5 reps   Modalities   Modalities Electrical Stimulation;Moist Heat;Cryotherapy   Moist Heat Therapy   Number Minutes Moist Heat 15 Minutes   Moist Heat Location Knee  Lt posterior   Cryotherapy   Cryotherapy Location Knee  Lt anterior   Type of Cryotherapy Ice pack   Electrical Stimulation   Electrical Stimulation Location 15   Electrical Stimulation Action ion repelling    Electrical  Stimulation Parameters to tolerance    Electrical Stimulation Goals Edema;Tone;Pain   Manual Therapy   Manual Therapy Joint mobilization;Passive ROM;Soft tissue mobilization   Joint Mobilization Lt ankle post talus grade III, metatarsal grade III; patella mobilizations sup/inf    Passive ROM Lt knee ext to tolerance                      PT Long Term Goals - 11/10/15 1459    PT LONG TERM GOAL #1   Title Patient I in HEP 12/02/15   Status On-going   PT LONG TERM GOAL #2   Title increase Lt knee extension =/> 110 degrees to assit with stair climbing 12/02/15   Status On-going   PT LONG TERM GOAL #3   Title increase Lt knee extension =/< -1 degrees 12/02/15   Status On-going   PT LONG TERM GOAL #4   Title ambulate without an assistive device and minimal gait deviations 12/02/15   Status On-going   PT LONG TERM GOAL #5   Title Improve FOTO to </= 47% limitation 12/02/15   Status On-going               Plan - 11/17/15 1547    Clinical Impression Statement Pt demonstrated slight increase in Lt knee flexion, decrease in Lt knee extension ROM this visit.  Pain limiting factor with attempts to stretch further into ROM.  Limited ROM in Lt knee continues to be concern with pt as she does not want manipulation.  No goals met this visit.     Pt will benefit from skilled therapeutic intervention in order to improve on the following deficits Abnormal gait;Decreased range of motion;Pain;Decreased scar mobility;Hypomobility;Decreased strength;Increased edema   Rehab Potential Excellent   PT Frequency 3x / week   PT Duration 4 weeks   PT Treatment/Interventions Ultrasound;Neuromuscular re-education;Scar mobilization;Biofeedback;Gait training;Patient/family education;Passive range of motion;Dry needling;Cryotherapy;Electrical Stimulation;Moist Heat;Therapeutic exercise;Manual techniques;Vasopneumatic Device   PT Next Visit Plan Continue progressive ROM to Lt knee.  Assess strength and  ROM, goals.  Write MD note.    Consulted and Agree with Plan of Care Patient        Problem List Patient Active Problem List   Diagnosis Date Noted  . Primary osteoarthritis of left knee 09/30/2015  . Primary osteoarthritis of knee 09/30/2015  . Primary osteoarthritis of left wrist 07/28/2015  . Olecranon bursitis of right elbow 06/16/2015  . Hemorrhoid 06/12/2014  . Absolute anemia 06/12/2014  . Abnormal facial hair 08/03/2013  . Trochanteric bursitis of  both hips 06/12/2013  . Low back pain 05/21/2013  . Osteoarthritis of both knees 05/04/2013  . Metatarsalgia of left foot 05/04/2013  . Obesity 02/22/2012  . Migraine without aura 11/23/2011  . Chronic daily headache 11/23/2011  . Muscle spasm 11/23/2011  . Insomnia 11/23/2011  . RHEUMATOID ARTHRITIS 08/15/2008   Kerin Perna, PTA 11/17/2015 5:11 PM  [place], [place], [address] Phone: [phone]   Fax:  [phone]  Name: NAYELIE GIONFRIDDO MRN: [id number] Date of Birth: April 14, 1961

## 2015-11-19 ENCOUNTER — Ambulatory Visit (INDEPENDENT_AMBULATORY_CARE_PROVIDER_SITE_OTHER): Payer: Managed Care, Other (non HMO) | Admitting: Physical Therapy

## 2015-11-19 DIAGNOSIS — M25662 Stiffness of left knee, not elsewhere classified: Secondary | ICD-10-CM | POA: Diagnosis not present

## 2015-11-19 DIAGNOSIS — R269 Unspecified abnormalities of gait and mobility: Secondary | ICD-10-CM | POA: Diagnosis not present

## 2015-11-19 DIAGNOSIS — R2240 Localized swelling, mass and lump, unspecified lower limb: Secondary | ICD-10-CM | POA: Diagnosis not present

## 2015-11-19 DIAGNOSIS — M25562 Pain in left knee: Secondary | ICD-10-CM

## 2015-11-19 DIAGNOSIS — R531 Weakness: Secondary | ICD-10-CM

## 2015-11-19 NOTE — Therapy (Signed)
[facility] [place] [address] [place], [place], [address] Phone: [phone]   Fax:  [phone]  Physical Therapy Treatment  Patient Details  Name: Rose Long MRN: [id number] Date of Birth: [birth year] Referring Provider: Dr. Marcene Corning  Encounter Date: 11/19/2015      PT End of Session - 11/19/15 1105    Visit Number 8   Number of Visits 12   Date for PT Re-Evaluation 12/02/15   PT Start Time 1100   PT Stop Time 1204   PT Time Calculation (min) 64 min   Activity Tolerance Patient tolerated treatment well      Past Medical History  Diagnosis Date  . Arthritis   . Obesity   . Anemia   . Simple ovarian cyst     Right  . Complication of anesthesia     PONV  . Family history of adverse reaction to anesthesia     pt's mother stopped breathing possibly due to sleep apnea    Past Surgical History  Procedure Laterality Date  . 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 Nasal sinus surgery  1999  . Breast surgery  1998    reduction  . Uterine ablation  2012  . Under arm skin removed    . Breast surgery  10/2013    Lift  . Abdominoplasty/panniculectomy    . Total knee arthroplasty Left 09/30/2015    Procedure: TOTAL KNEE ARTHROPLASTY;  Surgeon: Marcene Corning, MD;  Location: [facility];  Service: Orthopedics;  Laterality: Left;    There were no vitals filed for this visit.  Visit Diagnosis:  Stiffness of knee joint, left  Weakness generalized  Abnormality of gait  Localized swelling of lower leg  Left knee pain      Subjective Assessment - 11/19/15 1113    Subjective Pt reports her Lt ankle has been bothering her a little more since yesterday.  Yesterday and day before has tried to do less standing as to not aggrivate knee.    Currently in Pain? Yes   Pain Score 5   pain medicine prior to therapy   Pain Location Knee   Pain Orientation Left;Anterior   Pain Descriptors / Indicators Stabbing   Aggravating  Factors  prolonged standing    Pain Relieving Factors ice, rest.    Multiple Pain Sites Yes   Pain Score 3   Pain Location Ankle   Pain Orientation Left   Pain Descriptors / Indicators Dull   Aggravating Factors  PF in prone.    Pain Relieving Factors stillness, gentle ankle AROM            [facility] PT Assessment - 11/19/15 0001    Assessment   Medical Diagnosis Lt TKA    Referring Provider Dr. Marcene Corning   Onset Date/Surgical Date 09/29/16   Hand Dominance Left   Next MD Visit 11/19/15   AROM   AROM Assessment Site Knee   Right/Left Knee Left   Left Knee Extension -3   Left Knee Flexion 100   Strength   Strength Assessment Site Hip;Knee   Right/Left Hip Left   Left Hip Flexion 5/5   Left Hip Extension --  5-/5   Left Hip ABduction --  5-/5   Right/Left Knee Left   Left Knee Flexion 5/5   Left Knee Extension --  5-/5            [facility] Adult PT Treatment/Exercise - 11/19/15 0001    Knee/Hip Exercises: Stretches  Quad Stretch Left;3 reps  45 sec prone with strap   Knee/Hip Exercises: Aerobic   Stationary Bike L1 x 5'   Knee/Hip Exercises: Machines for Strengthening   Cybex Knee Extension 1 plate:  knee ext with bilat and controlled descent with LLE (eccentric) x 10 reps x 2 sets   Total Gym Leg Press LLE 3 plates x 10 reps x 2 sets   Knee/Hip Exercises: Standing   Lateral Step Up Left;1 set;10 reps;Step Height: 6";Hand Hold: 2   Step Down Right;Step Height: 6";Hand Hold: 2   then stepped up BWD with Lt.    Other Standing Knee Exercises 3x10 stretching into knee flex with foot on 13" step, followed by hamstring stretch after each repetition   Knee/Hip Exercises: Supine   Quad Sets Left;1 set;10 reps  with heat on knee prior to measurement   Heel Slides Strengthening;Left;AAROM;1 set;5 reps   Knee/Hip Exercises: Prone   Prone Knee Hang 5 minutes  with MHP on posterior knee, estim on ant Lt knee   Other Prone Exercises Prone TKE x 3 sec hold x 30 reps    Modalities   Modalities Electrical Stimulation;Moist Heat;Cryotherapy   Moist Heat Therapy   Number Minutes Moist Heat 15 Minutes   Moist Heat Location Knee;Ankle  Lt posterior   Cryotherapy   Number Minutes Cryotherapy 15 Minutes   Cryotherapy Location Knee   Type of Cryotherapy Ice pack   Electrical Stimulation   Electrical Stimulation Location 15   Electrical Stimulation Action ion repelling    Electrical Stimulation Parameters to tolerance    Electrical Stimulation Goals Edema;Tone;Pain     Manual therapy:   kinesiotape was place on anterior portion of Lt knee in web pattern to assist in edema reduction.          PT Long Term Goals - 11/19/15 1153    PT LONG TERM GOAL #1   Title Patient I in HEP 12/02/15   Time 4   Period Weeks   Status On-going   PT LONG TERM GOAL #2   Title increase Lt knee extension =/> 110 degrees to assit with stair climbing 12/02/15   Time 4   Period Weeks   Status On-going   PT LONG TERM GOAL #3   Title increase Lt knee extension =/< -1 degrees 12/02/15   Time 4   Period Weeks   Status On-going   PT LONG TERM GOAL #4   Title ambulate without an assistive device and minimal gait deviations 12/02/15   Time 4   Period Weeks   Status On-going   PT LONG TERM GOAL #5   Title Improve FOTO to </= 47% limitation 12/02/15   Time 4   Period Weeks   Status On-going   PT LONG TERM GOAL #6   Title report pain decrease =/< 2/10 after a work day 12/02/15   Time 4   Period Weeks   Status On-going   PT LONG TERM GOAL #7   Title increase strength bilat LEs's =/< 4+/5 2/14/174   Time 4   Period Weeks   Status Achieved               Plan - 11/19/15 1157    Clinical Impression Statement Pt demonstrated improved Lt knee ROM of 100 deg of flexion and lacking 3 deg of extension. Pt demonstrated improved LLE strength; has met LTG #7.  Pt was able to tolerate ROM exercises with greater ease with use of MHP and estim during  stretches.  Progressing  towards established goals.    Pt will benefit from skilled therapeutic intervention in order to improve on the following deficits Abnormal gait;Decreased range of motion;Pain;Decreased scar mobility;Hypomobility;Decreased strength;Increased edema   Rehab Potential Excellent   PT Frequency 3x / week   PT Duration 4 weeks   PT Treatment/Interventions Ultrasound;Neuromuscular re-education;Scar mobilization;Biofeedback;Gait training;Patient/family education;Passive range of motion;Dry needling;Cryotherapy;Electrical Stimulation;Moist Heat;Therapeutic exercise;Manual techniques;Vasopneumatic Device   PT Next Visit Plan Continue progressive ROM and strengthening to Lt knee.     Consulted and Agree with Plan of Care Patient        Problem List Patient Active Problem List   Diagnosis Date Noted  . Primary osteoarthritis of left knee 09/30/2015  . Primary osteoarthritis of knee 09/30/2015  . Primary osteoarthritis of left wrist 07/28/2015  . Olecranon bursitis of right elbow 06/16/2015  . Hemorrhoid 06/12/2014  . Absolute anemia 06/12/2014  . Abnormal facial hair 08/03/2013  . Trochanteric bursitis of both hips 06/12/2013  . Low back pain 05/21/2013  . Osteoarthritis of both knees 05/04/2013  . Metatarsalgia of left foot 05/04/2013  . Obesity 02/22/2012  . Migraine without aura 11/23/2011  . Chronic daily headache 11/23/2011  . Muscle spasm 11/23/2011  . Insomnia 11/23/2011  . RHEUMATOID ARTHRITIS 08/15/2008    Kerin Perna, PTA 11/19/2015 12:00 PM  [place], [place], [address] Phone: [phone]   Fax:  [phone]  Name: Rose Long MRN: [id number] Date of Birth: 10-04-1961

## 2015-11-19 NOTE — Therapy (Signed)
[place], [place], [address] Phone: [phone]   Fax:  [phone]  Physical Therapy Treatment  Patient Details  Name: Rose Long MRN: [id number] Date of Birth: [birth year] Referring Provider: Dr. Melrose Nakayama  Encounter Date: 11/19/2015      PT End of Session - 11/19/15 1105    Visit Number 8   Number of Visits 12   Date for PT Re-Evaluation 12/02/15   PT Start Time 1100   PT Stop Time 1204   PT Time Calculation (min) 64 min   Activity Tolerance Patient tolerated treatment well      Past Medical History  Diagnosis Date  . Arthritis   . Obesity   . Anemia   . Simple ovarian cyst     Right  . Complication of anesthesia     PONV  . Family history of adverse reaction to anesthesia     pt's mother stopped breathing possibly due to sleep apnea    Past Surgical History  Procedure Laterality Date  . 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 Nasal sinus surgery  1999  . Breast surgery  1998    reduction  . Uterine ablation  2012  . Under arm skin removed    . Breast surgery  10/2013    Lift  . Abdominoplasty/panniculectomy    . Total knee arthroplasty Left 09/30/2015    Procedure: TOTAL KNEE ARTHROPLASTY;  Surgeon: Melrose Nakayama, MD;  Location: [facility];  Service: Orthopedics;  Laterality: Left;    There were no vitals filed for this visit.  Visit Diagnosis:  Stiffness of knee joint, left  Weakness generalized  Abnormality of gait  Localized swelling of lower leg  Left knee pain      Subjective Assessment - 11/19/15 1113    Subjective Pt reports her Lt ankle has been bothering her a little more since yesterday.  Yesterday and day before has tried to do less standing as to not aggrivate knee.    Currently in Pain? Yes   Pain Score 5   pain medicine prior to therapy   Pain Location Knee   Pain Orientation Left;Anterior   Pain Descriptors / Indicators Stabbing   Aggravating  Factors  prolonged standing    Pain Relieving Factors ice, rest.    Multiple Pain Sites Yes   Pain Score 3   Pain Location Ankle   Pain Orientation Left   Pain Descriptors / Indicators Dull   Aggravating Factors  PF in prone.    Pain Relieving Factors stillness, gentle ankle AROM            [facility] PT Assessment - 11/19/15 0001    Assessment   Medical Diagnosis Lt TKA    Referring Provider Dr. Melrose Nakayama   Onset Date/Surgical Date 09/29/16   Hand Dominance Left   Next MD Visit 11/19/15   Observation/Other Assessments   Focus on Therapeutic Outcomes (FOTO)  53% limited (goal : 47% or less limited)   AROM   AROM Assessment Site Knee   Right/Left Knee Left   Left Knee Extension -3   Left Knee Flexion 100   Strength   Strength Assessment Site Hip;Knee   Right/Left Hip Left   Left Hip Flexion 5/5   Left Hip Extension --  5-/5   Left Hip ABduction --  5-/5   Right/Left Knee Left   Left Knee Flexion 5/5   Left Knee Extension --  5-/5  [facility] Adult PT Treatment/Exercise - 11/19/15 0001    Knee/Hip Exercises: Stretches   Sports administrator Left;3 reps  45 sec prone with strap   Knee/Hip Exercises: Aerobic   Stationary Bike L1 x 5'   Knee/Hip Exercises: Machines for Strengthening   Cybex Knee Extension 1 plate:  knee ext with bilat and controlled descent with LLE (eccentric) x 10 reps x 2 sets   Total Gym Leg Press LLE 3 plates x 10 reps x 2 sets   Knee/Hip Exercises: Standing   Lateral Step Up Left;1 set;10 reps;Step Height: 6";Hand Hold: 2   Step Down Right;Step Height: 6";Hand Hold: 2   then stepped up BWD with Lt.    Other Standing Knee Exercises 3x10 stretching into knee flex with foot on 13" step, followed by hamstring stretch after each repetition   Knee/Hip Exercises: Supine   Quad Sets Left;1 set;10 reps  with heat on knee prior to measurement   Heel Slides Strengthening;Left;AAROM;1 set;5 reps   Knee/Hip Exercises: Prone   Prone  Knee Hang 5 minutes  with MHP on posterior knee, estim on ant Lt knee   Other Prone Exercises Prone TKE x 3 sec hold x 30 reps   Modalities   Modalities Electrical Stimulation;Moist Heat;Cryotherapy   Moist Heat Therapy   Number Minutes Moist Heat 15 Minutes   Moist Heat Location Knee;Ankle  Lt posterior   Cryotherapy   Number Minutes Cryotherapy 15 Minutes   Cryotherapy Location Knee   Type of Cryotherapy Ice pack   Electrical Stimulation   Electrical Stimulation Location 15   Electrical Stimulation Action ion repelling    Electrical Stimulation Parameters to tolerance    Electrical Stimulation Goals Edema;Tone;Pain                     PT Long Term Goals - 11/19/15 1153    PT LONG TERM GOAL #1   Title Patient I in HEP 12/02/15   Time 4   Period Weeks   Status On-going   PT LONG TERM GOAL #2   Title increase Lt knee extension =/> 110 degrees to assit with stair climbing 12/02/15   Time 4   Period Weeks   Status On-going   PT LONG TERM GOAL #3   Title increase Lt knee extension =/< -1 degrees 12/02/15   Time 4   Period Weeks   Status On-going   PT LONG TERM GOAL #4   Title ambulate without an assistive device and minimal gait deviations 12/02/15   Time 4   Period Weeks   Status On-going   PT LONG TERM GOAL #5   Title Improve FOTO to </= 47% limitation 12/02/15   Time 4   Period Weeks   Status On-going   PT LONG TERM GOAL #6   Title report pain decrease =/< 2/10 after a work day 12/02/15   Time 4   Period Weeks   Status On-going   PT LONG TERM GOAL #7   Title increase strength bilat LEs's =/< 4+/5 2/14/174   Time 4   Period Weeks   Status Achieved               Plan - 11/19/15 1157    Clinical Impression Statement Pt demonstrated improved Lt knee ROM of 100 deg of flexion and lacking 3 deg of extension. Pt demonstrated improved LLE strength; has met LTG #7.  Pt was able to tolerate ROM exercises with greater ease with use of MHP and estim  during stretches.  Progressing towards established goals.    Pt will benefit from skilled therapeutic intervention in order to improve on the following deficits Abnormal gait;Decreased range of motion;Pain;Decreased scar mobility;Hypomobility;Decreased strength;Increased edema   Rehab Potential Excellent   PT Frequency 3x / week   PT Duration 4 weeks   PT Treatment/Interventions Ultrasound;Neuromuscular re-education;Scar mobilization;Biofeedback;Gait training;Patient/family education;Passive range of motion;Dry needling;Cryotherapy;Electrical Stimulation;Moist Heat;Therapeutic exercise;Manual techniques;Vasopneumatic Device   PT Next Visit Plan Continue progressive ROM and strengthening to Lt knee.     Consulted and Agree with Plan of Care Patient        Problem List Patient Active Problem List   Diagnosis Date Noted  . Primary osteoarthritis of left knee 09/30/2015  . Primary osteoarthritis of knee 09/30/2015  . Primary osteoarthritis of left wrist 07/28/2015  . Olecranon bursitis of right elbow 06/16/2015  . Hemorrhoid 06/12/2014  . Absolute anemia 06/12/2014  . Abnormal facial hair 08/03/2013  . Trochanteric bursitis of both hips 06/12/2013  . Low back pain 05/21/2013  . Osteoarthritis of both knees 05/04/2013  . Metatarsalgia of left foot 05/04/2013  . Obesity 02/22/2012  . Migraine without aura 11/23/2011  . Chronic daily headache 11/23/2011  . Muscle spasm 11/23/2011  . Insomnia 11/23/2011  . RHEUMATOID ARTHRITIS 08/15/2008    Shelbie Hutching 11/19/2015, 12:20 PM  [facility] [place], [place], [address] Phone: [phone]   Fax:  [phone]  Name: BERKLEY WRIGHTSMAN MRN: [id number] Date of Birth: 1961/06/15

## 2015-11-21 ENCOUNTER — Encounter: Payer: Self-pay | Admitting: Physical Therapy

## 2015-11-21 ENCOUNTER — Ambulatory Visit (INDEPENDENT_AMBULATORY_CARE_PROVIDER_SITE_OTHER): Payer: Managed Care, Other (non HMO) | Admitting: Physical Therapy

## 2015-11-21 DIAGNOSIS — M25662 Stiffness of left knee, not elsewhere classified: Secondary | ICD-10-CM | POA: Diagnosis not present

## 2015-11-21 DIAGNOSIS — R269 Unspecified abnormalities of gait and mobility: Secondary | ICD-10-CM | POA: Diagnosis not present

## 2015-11-21 DIAGNOSIS — R531 Weakness: Secondary | ICD-10-CM | POA: Diagnosis not present

## 2015-11-21 DIAGNOSIS — R2240 Localized swelling, mass and lump, unspecified lower limb: Secondary | ICD-10-CM | POA: Diagnosis not present

## 2015-11-21 DIAGNOSIS — M25562 Pain in left knee: Secondary | ICD-10-CM

## 2015-11-21 NOTE — Therapy (Signed)
[place], [place], [address] Phone: [phone]   Fax:  [phone]  Physical Therapy Treatment  Patient Details  Name: Rose Long MRN: [id number] Date of Birth: Nov 06, 1960 Referring Provider: Dr. Melrose Nakayama  Encounter Date: 11/21/2015      PT End of Session - 11/21/15 1410    Visit Number 9   Number of Visits 12   Date for PT Re-Evaluation 12/02/15   PT Start Time [id number]   PT Stop Time 1509   PT Time Calculation (min) 65 min      Past Medical History  Diagnosis Date  . Arthritis   . Obesity   . Anemia   . Simple ovarian cyst     Right  . Complication of anesthesia     PONV  . Family history of adverse reaction to anesthesia     pt's mother stopped breathing possibly due to sleep apnea    Past Surgical History  Procedure Laterality Date  . 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 Nasal sinus surgery  1999  . Breast surgery  1998    reduction  . Uterine ablation  2012  . Under arm skin removed    . Breast surgery  10/2013    Lift  . Abdominoplasty/panniculectomy    . Total knee arthroplasty Left 09/30/2015    Procedure: TOTAL KNEE ARTHROPLASTY;  Surgeon: Melrose Nakayama, MD;  Location: [facility];  Service: Orthopedics;  Laterality: Left;    There were no vitals filed for this visit.  Visit Diagnosis:  Stiffness of knee joint, left  Weakness generalized  Abnormality of gait  Localized swelling of lower leg  Left knee pain      Subjective Assessment - 11/21/15 1410    Subjective Saw MD on Wed he is very pleased with the motion she has.    Currently in Pain? Yes   Pain Score 4    Pain Location Knee                         [facility] Adult PT Treatment/Exercise - 11/21/15 0001    Knee/Hip Exercises: Aerobic   Stationary Bike L1 x 5'   Knee/Hip Exercises: Standing   Hip Extension Stengthening;Left;3 sets;10 reps;Knee straight  green band   SLS with Vectors  side stepping    Other Standing Knee Exercises 3x10 stretching into knee flex with foot on 13" step, followed by hamstring stretch after each repetition   Other Standing Knee Exercises XTS pink band 10 reps walking BWD, HS curls   Knee/Hip Exercises: Seated   Long Arc Quad 15 reps   Knee/Hip Exercises: Supine   Bridges Limitations 3x10 with feet on chair   Modalities   Modalities Electrical Stimulation;Moist Heat;Cryotherapy   Moist Heat Therapy   Number Minutes Moist Heat 15 Minutes   Moist Heat Location Knee;Ankle  posterior knee   Cryotherapy   Number Minutes Cryotherapy 15 Minutes   Cryotherapy Location Knee   Type of Cryotherapy Ice pack   Electrical Stimulation   Electrical Stimulation Location 15   Electrical Stimulation Action ion repelling   Electrical Stimulation Parameters to tolerance   Electrical Stimulation Goals Edema                     PT Long Term Goals - 11/19/15 1153    PT LONG TERM GOAL #1   Title Patient I in HEP 12/02/15   Time 4  Period Weeks   Status On-going   PT LONG TERM GOAL #2   Title increase Lt knee extension =/> 110 degrees to assit with stair climbing 12/02/15   Time 4   Period Weeks   Status On-going   PT LONG TERM GOAL #3   Title increase Lt knee extension =/< -1 degrees 12/02/15   Time 4   Period Weeks   Status On-going   PT LONG TERM GOAL #4   Title ambulate without an assistive device and minimal gait deviations 12/02/15   Time 4   Period Weeks   Status On-going   PT LONG TERM GOAL #5   Title Improve FOTO to </= 47% limitation 12/02/15   Time 4   Period Weeks   Status On-going   PT LONG TERM GOAL #6   Title report pain decrease =/< 2/10 after a work day 12/02/15   Time 4   Period Weeks   Status On-going   PT LONG TERM GOAL #7   Title increase strength bilat LEs's =/< 4+/5 2/14/174   Time 4   Period Weeks   Status Achieved               Plan - 11/21/15 1457    Clinical Impression Statement Pt  tolerated tx, fatigued with all the exercise in standing and had some increased knee pain.    Pt will benefit from skilled therapeutic intervention in order to improve on the following deficits Abnormal gait;Decreased range of motion;Pain;Decreased scar mobility;Hypomobility;Decreased strength;Increased edema   Rehab Potential Excellent   PT Frequency 3x / week   PT Duration 4 weeks   PT Treatment/Interventions Ultrasound;Neuromuscular re-education;Scar mobilization;Biofeedback;Gait training;Patient/family education;Passive range of motion;Dry needling;Cryotherapy;Electrical Stimulation;Moist Heat;Therapeutic exercise;Manual techniques;Vasopneumatic Device   PT Next Visit Plan Continue progressive ROM and strengthening to Lt knee.     Consulted and Agree with Plan of Care Patient        Problem List Patient Active Problem List   Diagnosis Date Noted  . Primary osteoarthritis of left knee 09/30/2015  . Primary osteoarthritis of knee 09/30/2015  . Primary osteoarthritis of left wrist 07/28/2015  . Olecranon bursitis of right elbow 06/16/2015  . Hemorrhoid 06/12/2014  . Absolute anemia 06/12/2014  . Abnormal facial hair 08/03/2013  . Trochanteric bursitis of both hips 06/12/2013  . Low back pain 05/21/2013  . Osteoarthritis of both knees 05/04/2013  . Metatarsalgia of left foot 05/04/2013  . Obesity 02/22/2012  . Migraine without aura 11/23/2011  . Chronic daily headache 11/23/2011  . Muscle spasm 11/23/2011  . Insomnia 11/23/2011  . RHEUMATOID ARTHRITIS 08/15/2008    Jeral Pinch PT 11/21/2015, 2:59 PM  [facility] [place], [place], [address] Phone: [phone]   Fax:  [phone]  Name: EVALET LUTTRULL MRN: [id number] Date of Birth: November 13, 1960

## 2015-11-24 ENCOUNTER — Ambulatory Visit (INDEPENDENT_AMBULATORY_CARE_PROVIDER_SITE_OTHER): Payer: Managed Care, Other (non HMO) | Admitting: Physical Therapy

## 2015-11-24 DIAGNOSIS — M25662 Stiffness of left knee, not elsewhere classified: Secondary | ICD-10-CM

## 2015-11-24 DIAGNOSIS — R531 Weakness: Secondary | ICD-10-CM

## 2015-11-24 DIAGNOSIS — M25562 Pain in left knee: Secondary | ICD-10-CM

## 2015-11-24 DIAGNOSIS — R2240 Localized swelling, mass and lump, unspecified lower limb: Secondary | ICD-10-CM

## 2015-11-24 DIAGNOSIS — R269 Unspecified abnormalities of gait and mobility: Secondary | ICD-10-CM | POA: Diagnosis not present

## 2015-11-24 NOTE — Therapy (Signed)
[place], [place], [address] Phone: [phone]   Fax:  [phone]  Physical Therapy Treatment  Patient Details  Name: Rose Long MRN: [id number] Date of Birth: 1961/02/25 Referring Provider: Dr. Melrose Nakayama   Encounter Date: 11/24/2015      PT End of Session - 11/24/15 1417    Visit Number 10   Number of Visits 12   Date for PT Re-Evaluation 12/02/15   PT Start Time [id number]   PT Stop Time 1505   PT Time Calculation (min) 60 min      Past Medical History  Diagnosis Date  . Arthritis   . Obesity   . Anemia   . Simple ovarian cyst     Right  . Complication of anesthesia     PONV  . Family history of adverse reaction to anesthesia     pt's mother stopped breathing possibly due to sleep apnea    Past Surgical History  Procedure Laterality Date  . 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 Nasal sinus surgery  1999  . Breast surgery  1998    reduction  . Uterine ablation  2012  . Under arm skin removed    . Breast surgery  10/2013    Lift  . Abdominoplasty/panniculectomy    . Total knee arthroplasty Left 09/30/2015    Procedure: TOTAL KNEE ARTHROPLASTY;  Surgeon: Melrose Nakayama, MD;  Location: [facility];  Service: Orthopedics;  Laterality: Left;    There were no vitals filed for this visit.  Visit Diagnosis:  Stiffness of knee joint, left  Weakness generalized  Abnormality of gait  Localized swelling of lower leg  Left knee pain      Subjective Assessment - 11/24/15 1409    Subjective Pt reported she had a rough weekend, feels her Lt knee is more swollen today. Pt reports she is able to medicate on weekends because her boyfriend can drive her, but during week she is not able to.    Currently in Pain? Yes   Pain Score 7    Pain Location Knee   Pain Orientation Left   Pain Descriptors / Indicators Aching;Shooting;Stabbing   Aggravating Factors  weight bearing, bending knee   Pain Relieving Factors ice, rest.             [facility] PT Assessment - 11/24/15 0001    Assessment   Medical Diagnosis Lt TKA    Referring Provider Dr. Melrose Nakayama    Onset Date/Surgical Date 09/29/16   Hand Dominance Left            [facility] Adult PT Treatment/Exercise - 11/24/15 0001    Knee/Hip Exercises: Stretches   Gastroc Stretch Left;2 reps;30 seconds   Soleus Stretch Left;2 reps;30 seconds   Knee/Hip Exercises: Aerobic   Stationary Bike Partial revolutions x 5 min, full revolutions x 5 min (bike did not turn on) compensating at hip    Knee/Hip Exercises: Standing   Heel Raises Both;2 sets;10 reps   SLS Lt SLS x 3 trials - up to 10 sec    Other Standing Knee Exercises 3x10 stretching into knee flex with foot on 13" step, followed by hamstring stretch after each repetition   Other Standing Knee Exercises Lt TKE with ball against wall x 5 sec x 10 reps    Modalities   Modalities Electrical Stimulation;Cryotherapy;Moist Heat   Moist Heat Therapy   Number Minutes Moist Heat 15 Minutes   Moist Heat Location  Knee;Ankle  posterior knee   Cryotherapy   Number Minutes Cryotherapy 15 Minutes   Cryotherapy Location Knee   Type of Cryotherapy Ice pack   Electrical Stimulation   Electrical Stimulation Location 15   Electrical Stimulation Action ion repelling  to ant / premod to Lt hamstrings    Electrical Stimulation Parameters to tolerance    Electrical Stimulation Goals Edema;Pain   Manual Therapy   Manual Therapy Joint mobilization;Passive ROM;Soft tissue mobilization   Joint Mobilization Lt ankle post talus grade III, metatarsal grade III   Soft tissue mobilization to Lt hamstrings, TPR along lateral hamstring with passive knee flex/ext, TPR to Lt TFL, cross fiber to Lt ITB           PT Long Term Goals - 11/19/15 1153    PT LONG TERM GOAL #1   Title Patient I in HEP 12/02/15   Time 4   Period Weeks   Status On-going   PT LONG TERM GOAL #2   Title increase Lt  knee extension =/> 110 degrees to assit with stair climbing 12/02/15   Time 4   Period Weeks   Status On-going   PT LONG TERM GOAL #3   Title increase Lt knee extension =/< -1 degrees 12/02/15   Time 4   Period Weeks   Status On-going   PT LONG TERM GOAL #4   Title ambulate without an assistive device and minimal gait deviations 12/02/15   Time 4   Period Weeks   Status On-going   PT LONG TERM GOAL #5   Title Improve FOTO to </= 47% limitation 12/02/15   Time 4   Period Weeks   Status On-going   PT LONG TERM GOAL #6   Title report pain decrease =/< 2/10 after a work day 12/02/15   Time 4   Period Weeks   Status On-going   PT LONG TERM GOAL #7   Title increase strength bilat LEs's =/< 4+/5 2/14/174   Time 4   Period Weeks   Status Achieved               Plan - 11/24/15 1621    Clinical Impression Statement Pt very point tender in Lt hamstring, ITB, and tensor fascia latea with manual therapy.  Decreased tolerance to exercise this visit due to increased pain. Pt reported slight decrease in pain after manual therapy and estim.    Pt will benefit from skilled therapeutic intervention in order to improve on the following deficits Abnormal gait;Decreased range of motion;Pain;Decreased scar mobility;Hypomobility;Decreased strength;Increased edema   Rehab Potential Excellent   PT Frequency 3x / week   PT Duration 4 weeks   PT Treatment/Interventions Ultrasound;Neuromuscular re-education;Scar mobilization;Biofeedback;Gait training;Patient/family education;Passive range of motion;Dry needling;Cryotherapy;Electrical Stimulation;Moist Heat;Therapeutic exercise;Manual techniques;Vasopneumatic Device   PT Next Visit Plan Continue progressive ROM and strengthening to Lt knee.     Consulted and Agree with Plan of Care Patient        Problem List Patient Active Problem List   Diagnosis Date Noted  . Primary osteoarthritis of left knee 09/30/2015  . Primary osteoarthritis of knee  09/30/2015  . Primary osteoarthritis of left wrist 07/28/2015  . Olecranon bursitis of right elbow 06/16/2015  . Hemorrhoid 06/12/2014  . Absolute anemia 06/12/2014  . Abnormal facial hair 08/03/2013  . Trochanteric bursitis of both hips 06/12/2013  . Low back pain 05/21/2013  . Osteoarthritis of both knees 05/04/2013  . Metatarsalgia of left foot 05/04/2013  . Obesity 02/22/2012  . Migraine  without aura 11/23/2011  . Chronic daily headache 11/23/2011  . Muscle spasm 11/23/2011  . Insomnia 11/23/2011  . RHEUMATOID ARTHRITIS 08/15/2008    Kerin Perna, PTA 11/24/2015 4:58 PM  [place], [place], [address] Phone: [phone]   Fax:  [phone]  Name: KILEY FOWLER MRN: [id number] Date of Birth: [birth year]

## 2015-11-25 ENCOUNTER — Encounter: Payer: Self-pay | Admitting: Family Medicine

## 2015-11-25 DIAGNOSIS — Z96652 Presence of left artificial knee joint: Secondary | ICD-10-CM | POA: Insufficient documentation

## 2015-11-26 ENCOUNTER — Ambulatory Visit (INDEPENDENT_AMBULATORY_CARE_PROVIDER_SITE_OTHER): Payer: Managed Care, Other (non HMO) | Admitting: Physical Therapy

## 2015-11-26 ENCOUNTER — Encounter: Payer: Self-pay | Admitting: Physical Therapy

## 2015-11-26 DIAGNOSIS — R531 Weakness: Secondary | ICD-10-CM | POA: Diagnosis not present

## 2015-11-26 DIAGNOSIS — R2240 Localized swelling, mass and lump, unspecified lower limb: Secondary | ICD-10-CM | POA: Diagnosis not present

## 2015-11-26 DIAGNOSIS — M25662 Stiffness of left knee, not elsewhere classified: Secondary | ICD-10-CM | POA: Diagnosis not present

## 2015-11-26 DIAGNOSIS — R269 Unspecified abnormalities of gait and mobility: Secondary | ICD-10-CM | POA: Diagnosis not present

## 2015-11-26 DIAGNOSIS — M25562 Pain in left knee: Secondary | ICD-10-CM

## 2015-11-26 NOTE — Therapy (Signed)
[place], [place], [address] Phone: [phone]   Fax:  [phone]  Physical Therapy Treatment  Patient Details  Name: Rose Long MRN: [id number] Date of Birth: 1961/03/04 Referring Provider: Dr. Melrose Nakayama   Encounter Date: 11/26/2015      PT End of Session - 11/26/15 1434    Visit Number 11   Number of Visits 12   Date for PT Re-Evaluation 12/02/15   PT Start Time [id number]   PT Stop Time [id number]   PT Time Calculation (min) 47 min   Activity Tolerance Patient tolerated treatment well      Past Medical History  Diagnosis Date  . Arthritis   . Obesity   . Anemia   . Simple ovarian cyst     Right  . Complication of anesthesia     PONV  . Family history of adverse reaction to anesthesia     pt's mother stopped breathing possibly due to sleep apnea    Past Surgical History  Procedure Laterality Date  . 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 Nasal sinus surgery  1999  . Breast surgery  1998    reduction  . Uterine ablation  2012  . Under arm skin removed    . Breast surgery  10/2013    Lift  . Abdominoplasty/panniculectomy    . Total knee arthroplasty Left 09/30/2015    Procedure: TOTAL KNEE ARTHROPLASTY;  Surgeon: Melrose Nakayama, MD;  Location: [facility];  Service: Orthopedics;  Laterality: Left;    There were no vitals filed for this visit.  Visit Diagnosis:  Stiffness of knee joint, left  Weakness generalized  Abnormality of gait  Localized swelling of lower leg  Left knee pain      Subjective Assessment - 11/26/15 1405    Subjective " I think I twisted my (Lt) knee when I was sitting at work".  Pt reports increased pain in knee.  Pt reports she was very active yesterday, on feet without any breaks to elevate leg.    Currently in Pain? Yes   Pain Score 6    Pain Location Knee   Pain Orientation Left            [facility] PT Assessment - 11/26/15 0001    Assessment   Medical Diagnosis Lt TKA    Referring Provider Dr. Melrose Nakayama    Onset Date/Surgical Date 09/29/16   Hand Dominance Left   Next MD Visit 12/17/15                     [facility] Adult PT Treatment/Exercise - 11/26/15 0001    Knee/Hip Exercises: Stretches   Gastroc Stretch Left;2 reps;30 seconds   Soleus Stretch Left;2 reps;30 seconds   Knee/Hip Exercises: Aerobic   Stationary Bike Partial revolutions x 5 min, full revolutions x 5 min (bike did not turn on) compensating at hip    Knee/Hip Exercises: Standing   Heel Raises 15 reps;Both   Wall Squat 2 sets;10 reps   Modalities   Modalities Electrical Stimulation;Cryotherapy;Moist Heat   Moist Heat Therapy   Number Minutes Moist Heat 15 Minutes   Moist Heat Location Knee;Ankle   Cryotherapy   Number Minutes Cryotherapy 15 Minutes   Cryotherapy Location Knee   Type of Cryotherapy Ice pack   Electrical Stimulation   Electrical Stimulation Location 15   Electrical Stimulation Action ion repelling   Electrical Stimulation Parameters to tolerance   Electrical Stimulation Goals  Edema;Pain          Trigger Point Dry Needling - 11/26/15 1434    Consent Given? Yes   Education Handout Provided No   Muscles Treated Lower Body Quadriceps  distal lateral   Quadriceps Response Twitch response elicited;Palpable increased muscle length     Manual therapy: soft tissue work to medial Lt knee and Lt distal quad.  Very tight just proximal to the incision.               PT Long Term Goals - 11/19/15 1153    PT LONG TERM GOAL #1   Title Patient I in HEP 12/02/15   Time 4   Period Weeks   Status On-going   PT LONG TERM GOAL #2   Title increase Lt knee extension =/> 110 degrees to assit with stair climbing 12/02/15   Time 4   Period Weeks   Status On-going   PT LONG TERM GOAL #3   Title increase Lt knee extension =/< -1 degrees 12/02/15   Time 4   Period Weeks   Status On-going   PT LONG TERM GOAL #4   Title ambulate  without an assistive device and minimal gait deviations 12/02/15   Time 4   Period Weeks   Status On-going   PT LONG TERM GOAL #5   Title Improve FOTO to </= 47% limitation 12/02/15   Time 4   Period Weeks   Status On-going   PT LONG TERM GOAL #6   Title report pain decrease =/< 2/10 after a work day 12/02/15   Time 4   Period Weeks   Status On-going   PT LONG TERM GOAL #7   Title increase strength bilat LEs's =/< 4+/5 2/14/174   Time 4   Period Weeks   Status Achieved               Plan - 11/26/15 1513    Clinical Impression Statement Rose Long had a sligth set back today after twisting in her chair at work and tweaking the inside of her knee.    Pt will benefit from skilled therapeutic intervention in order to improve on the following deficits Abnormal gait;Decreased range of motion;Pain;Decreased scar mobility;Hypomobility;Decreased strength;Increased edema   Rehab Potential Excellent   PT Duration 4 weeks   PT Treatment/Interventions Ultrasound;Neuromuscular re-education;Scar mobilization;Biofeedback;Gait training;Patient/family education;Passive range of motion;Dry needling;Cryotherapy;Electrical Stimulation;Moist Heat;Therapeutic exercise;Manual techniques;Vasopneumatic Device   PT Next Visit Plan Continue progressive ROM and strengthening to Lt knee.     Consulted and Agree with Plan of Care Patient        Problem List Patient Active Problem List   Diagnosis Date Noted  . History of left knee replacement 11/25/2015  . Primary osteoarthritis of left knee 09/30/2015  . Primary osteoarthritis of knee 09/30/2015  . Primary osteoarthritis of left wrist 07/28/2015  . Olecranon bursitis of right elbow 06/16/2015  . Hemorrhoid 06/12/2014  . Absolute anemia 06/12/2014  . Abnormal facial hair 08/03/2013  . Trochanteric bursitis of both hips 06/12/2013  . Low back pain 05/21/2013  . Osteoarthritis of both knees 05/04/2013  . Metatarsalgia of left foot 05/04/2013  .  Obesity 02/22/2012  . Migraine without aura 11/23/2011  . Chronic daily headache 11/23/2011  . Muscle spasm 11/23/2011  . Insomnia 11/23/2011  . RHEUMATOID ARTHRITIS 08/15/2008    Jeral Pinch PT 11/26/2015, 3:15 PM  [facility] [place], [place], [address] Phone: [phone]   Fax:  [phone]  Name: Rose Long MRN: [id number] Date of Birth: 04/18/1961

## 2015-11-28 ENCOUNTER — Ambulatory Visit (INDEPENDENT_AMBULATORY_CARE_PROVIDER_SITE_OTHER): Payer: Managed Care, Other (non HMO) | Admitting: Physical Therapy

## 2015-11-28 ENCOUNTER — Ambulatory Visit: Payer: Managed Care, Other (non HMO) | Admitting: Family Medicine

## 2015-11-28 DIAGNOSIS — M25562 Pain in left knee: Secondary | ICD-10-CM

## 2015-11-28 DIAGNOSIS — R531 Weakness: Secondary | ICD-10-CM

## 2015-11-28 DIAGNOSIS — M25662 Stiffness of left knee, not elsewhere classified: Secondary | ICD-10-CM | POA: Diagnosis not present

## 2015-11-28 DIAGNOSIS — R2240 Localized swelling, mass and lump, unspecified lower limb: Secondary | ICD-10-CM

## 2015-11-28 DIAGNOSIS — R269 Unspecified abnormalities of gait and mobility: Secondary | ICD-10-CM | POA: Diagnosis not present

## 2015-11-28 NOTE — Therapy (Signed)
[place], [place], [address] Phone: [phone]   Fax:  [phone]  Physical Therapy Treatment  Patient Details  Name: Rose Long MRN: [id number] Date of Birth: 04/05/1961 Referring Provider: Dr. Melrose Nakayama  Encounter Date: 11/28/2015      PT End of Session - 11/28/15 1448    Visit Number 12   Number of Visits 12   Date for PT Re-Evaluation 12/02/15   PT Start Time 1402   PT Stop Time 1501   PT Time Calculation (min) 59 min      Past Medical History  Diagnosis Date  . Arthritis   . Obesity   . Anemia   . Simple ovarian cyst     Right  . Complication of anesthesia     PONV  . Family history of adverse reaction to anesthesia     pt's mother stopped breathing possibly due to sleep apnea    Past Surgical History  Procedure Laterality Date  . 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 Nasal sinus surgery  1999  . Breast surgery  1998    reduction  . Uterine ablation  2012  . Under arm skin removed    . Breast surgery  10/2013    Lift  . Abdominoplasty/panniculectomy    . Total knee arthroplasty Left 09/30/2015    Procedure: TOTAL KNEE ARTHROPLASTY;  Surgeon: Melrose Nakayama, MD;  Location: [facility];  Service: Orthopedics;  Laterality: Left;    There were no vitals filed for this visit.  Visit Diagnosis:  Stiffness of knee joint, left  Weakness generalized  Abnormality of gait  Localized swelling of lower leg  Left knee pain      Subjective Assessment - 11/28/15 1418    Subjective "I feel like I've gone backwards; I don't have as much bend"    Currently in Pain? Yes   Pain Score 4    Pain Location Knee   Pain Orientation Left   Pain Descriptors / Indicators Aching            [facility] PT Assessment - 11/28/15 0001    Assessment   Medical Diagnosis Lt TKA    Referring Provider Dr. Melrose Nakayama   Onset Date/Surgical Date 09/29/16   Hand Dominance Left   Next MD  Visit 12/17/15   AROM   AROM Assessment Site Knee   Right/Left Knee Left   Left Knee Extension -5   Left Knee Flexion 93           [facility] Adult PT Treatment/Exercise - 11/28/15 0001    Ambulation/Gait   Ambulation Distance (Feet) --  multiple trials in gym.    Assistive device None   Gait Pattern Step-through pattern   Ambulation Surface Indoor;Level   Pre-Gait Activities high knee marching with hand to knee taps; buttocks kicks   Gait Comments VC for increased Lt heel strike, increased Lt knee flexion during swing through, increased Rt step length.    Knee/Hip Exercises: Stretches   Sports administrator Left;3 reps  45 sec prone with strap   Gastroc Stretch Left;2 reps;30 seconds   Soleus Stretch Left;2 reps;30 seconds   Other Knee/Hip Stretches Lt ITB x 30 sec x 2 reps    Knee/Hip Exercises: Aerobic   Stationary Bike L1 x 5'   Knee/Hip Exercises: Standing   SLS Lt SLS with Rt toe taps front, side, back x 5 reps each direction x 2 sets (challenging)    Other Standing  Knee Exercises 3x10 stretching into knee flex with foot on 13" step, followed by hamstring stretch after each repetition   Modalities   Modalities Electrical Stimulation;Cryotherapy;Moist Heat   Moist Heat Therapy   Number Minutes Moist Heat 15 Minutes   Moist Heat Location Knee;Ankle   Cryotherapy   Number Minutes Cryotherapy 15 Minutes   Cryotherapy Location Knee   Type of Cryotherapy Ice pack   Electrical Stimulation   Electrical Stimulation Location 15   Electrical Stimulation Action IFC    Electrical Stimulation Parameters to tolerance   Electrical Stimulation Goals Edema;Pain   Manual Therapy   Manual Therapy Passive ROM;Soft tissue mobilization   Soft tissue mobilization to Lt hamstring, glutes, ITB, calf.    Passive ROM into Lt knee flexion with pt in prone, to tolerance (after contract/ relax pattern)                     PT Long Term Goals - 11/19/15 1153    PT LONG TERM GOAL #1   Title  Patient I in HEP 12/02/15   Time 4   Period Weeks   Status On-going   PT LONG TERM GOAL #2   Title increase Lt knee extension =/> 110 degrees to assit with stair climbing 12/02/15   Time 4   Period Weeks   Status On-going   PT LONG TERM GOAL #3   Title increase Lt knee extension =/< -1 degrees 12/02/15   Time 4   Period Weeks   Status On-going   PT LONG TERM GOAL #4   Title ambulate without an assistive device and minimal gait deviations 12/02/15   Time 4   Period Weeks   Status On-going   PT LONG TERM GOAL #5   Title Improve FOTO to </= 47% limitation 12/02/15   Time 4   Period Weeks   Status On-going   PT LONG TERM GOAL #6   Title report pain decrease =/< 2/10 after a work day 12/02/15   Time 4   Period Weeks   Status On-going   PT LONG TERM GOAL #7   Title increase strength bilat LEs's =/< 4+/5 2/14/174   Time 4   Period Weeks   Status Achieved               Plan - 11/28/15 1631    Clinical Impression Statement Pt demonstrating slight set back with decreased Lt knee ROM.  Pt's Lt hamstrings tight and tender with manual therapy.  Pt did have positive response with TDN.  Pt is making gradual progress towards goals and desires to continue therapy to meet established goals.    Pt will benefit from skilled therapeutic intervention in order to improve on the following deficits Abnormal gait;Decreased range of motion;Pain;Decreased scar mobility;Hypomobility;Decreased strength;Increased edema   Rehab Potential Excellent   PT Frequency 3x / week   PT Duration 4 weeks   PT Treatment/Interventions Ultrasound;Neuromuscular re-education;Scar mobilization;Biofeedback;Gait training;Patient/family education;Passive range of motion;Dry needling;Cryotherapy;Electrical Stimulation;Moist Heat;Therapeutic exercise;Manual techniques;Vasopneumatic Device   PT Next Visit Plan Spoke to supervising PT regarding pt's progress and her desire to continue PT.  Will continue progressive ROM and  strengthening to increase her functional mobility.    Consulted and Agree with Plan of Care Patient        Problem List Patient Active Problem List   Diagnosis Date Noted  . History of left knee replacement 11/25/2015  . Primary osteoarthritis of left knee 09/30/2015  . Primary osteoarthritis of knee 09/30/2015  .  Primary osteoarthritis of left wrist 07/28/2015  . Olecranon bursitis of right elbow 06/16/2015  . Hemorrhoid 06/12/2014  . Absolute anemia 06/12/2014  . Abnormal facial hair 08/03/2013  . Trochanteric bursitis of both hips 06/12/2013  . Low back pain 05/21/2013  . Osteoarthritis of both knees 05/04/2013  . Metatarsalgia of left foot 05/04/2013  . Obesity 02/22/2012  . Migraine without aura 11/23/2011  . Chronic daily headache 11/23/2011  . Muscle spasm 11/23/2011  . Insomnia 11/23/2011  . RHEUMATOID ARTHRITIS 08/15/2008    Kerin Perna, PTA 11/28/2015 4:34 PM  [place], [place], [address] Phone: [phone]   Fax:  [phone]  Name: Rose Long MRN: [id number] Date of Birth: 1961-02-25

## 2015-12-01 ENCOUNTER — Ambulatory Visit (INDEPENDENT_AMBULATORY_CARE_PROVIDER_SITE_OTHER): Payer: Managed Care, Other (non HMO) | Admitting: Physical Therapy

## 2015-12-01 DIAGNOSIS — R2240 Localized swelling, mass and lump, unspecified lower limb: Secondary | ICD-10-CM

## 2015-12-01 DIAGNOSIS — R531 Weakness: Secondary | ICD-10-CM | POA: Diagnosis not present

## 2015-12-01 DIAGNOSIS — M25662 Stiffness of left knee, not elsewhere classified: Secondary | ICD-10-CM

## 2015-12-01 DIAGNOSIS — M25562 Pain in left knee: Secondary | ICD-10-CM

## 2015-12-01 DIAGNOSIS — R269 Unspecified abnormalities of gait and mobility: Secondary | ICD-10-CM

## 2015-12-01 NOTE — Therapy (Signed)
[place], [place], [address] Phone: [phone]   Fax:  [phone]  Physical Therapy Treatment  Patient Details  Name: Rose Long MRN: [id number] Date of Birth: Apr 24, 1961 Referring Provider: Dr. Melrose Nakayama   Encounter Date: 12/01/2015      PT End of Session - 12/01/15 1405    Visit Number 13   Number of Visits 24   Date for PT Re-Evaluation 12/26/15   PT Start Time 1400   PT Stop Time 1515   PT Time Calculation (min) 75 min      Past Medical History  Diagnosis Date  . Arthritis   . Obesity   . Anemia   . Simple ovarian cyst     Right  . Complication of anesthesia     PONV  . Family history of adverse reaction to anesthesia     pt's mother stopped breathing possibly due to sleep apnea    Past Surgical History  Procedure Laterality Date  . 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 Nasal sinus surgery  1999  . Breast surgery  1998    reduction  . Uterine ablation  2012  . Under arm skin removed    . Breast surgery  10/2013    Lift  . Abdominoplasty/panniculectomy    . Total knee arthroplasty Left 09/30/2015    Procedure: TOTAL KNEE ARTHROPLASTY;  Surgeon: Melrose Nakayama, MD;  Location: [facility];  Service: Orthopedics;  Laterality: Left;    There were no vitals filed for this visit.  Visit Diagnosis:  Stiffness of knee joint, left  Abnormality of gait  Weakness generalized  Localized swelling of lower leg  Left knee pain      Subjective Assessment - 12/01/15 1403    Subjective Pt reports it was not a good weekend. Pt feels discouraged.  Pt c/o of increased stiffness and "puffiness" in lateral aspect of Lt knee.    Currently in Pain? Yes   Pain Score 7    Pain Location Knee   Pain Orientation Left   Pain Descriptors / Indicators Aching;Sore;Sharp   Aggravating Factors  bending knee    Pain Relieving Factors ice, rest.             [facility] PT Assessment - 12/02/15  0001    Assessment   Medical Diagnosis Lt TKA    AROM   AROM Assessment Site Knee   Right/Left Knee Left   Left Knee Extension -4   Left Knee Flexion 95                     [facility] Adult PT Treatment/Exercise - 12/01/15 0001    Knee/Hip Exercises: Stretches   Passive Hamstring Stretch 3 reps;Left;30 seconds   Quad Stretch Left;3 reps  45 sec prone with strap   Gastroc Stretch Left;2 reps;30 seconds   Soleus Stretch Left;2 reps;30 seconds   Knee/Hip Exercises: Aerobic   Stationary Bike L1 x 5'   Knee/Hip Exercises: Seated   Other Seated Knee/Hip Exercises seated scoots in chair to increase Lt knee ROM x 8 reps    Modalities   Modalities Electrical Stimulation;Vasopneumatic;Ultrasound   Moist Heat Therapy   Moist Heat Location Ankle   Electrical Stimulation   Electrical Stimulation Location 15   Electrical Stimulation Action IFC   Electrical Stimulation Parameters to tolerance    Electrical Stimulation Goals Edema;Pain   Ultrasound   Ultrasound Location Lt lateral knee    Ultrasound Parameters  50%, 1.2 w/cm2, 8 min    Ultrasound Goals Edema;Pain   Vasopneumatic   Number Minutes Vasopneumatic  15 minutes   Vasopnuematic Location  Knee   Vasopneumatic Pressure Low   Vasopneumatic Temperature  3 snowflakes    Manual Therapy   Manual Therapy Passive ROM;Soft tissue mobilization;Joint mobilization   Joint Mobilization Lt ankle post talus, metatarsal    Soft tissue mobilization Edge tool assistance to Lt quad, hamstring, ITB / lateral quad junction to decrease fascial restrictions and pain   Passive ROM into Lt knee flexion (with pt supine) and Lt knee into ext with overpressure, to tolerance                      PT Long Term Goals - 12/01/15 1652    PT LONG TERM GOAL #1   Title Patient I in HEP 12/26/15   Time 4  added 3x/wk x 4 wk 11/28/15   Period Weeks   Status Revised   PT LONG TERM GOAL #2   Title increase Lt knee extension =/> 110 degrees to  assit with stair climbing 12/26/15   Time 4   Period Weeks   Status Revised   PT LONG TERM GOAL #3   Title increase Lt knee extension =/< -1 degrees 12/26/15   Time 4   Period Weeks   Status Revised   PT LONG TERM GOAL #4   Title ambulate without an assistive device and minimal gait deviations 12/26/15   Time 4   Period Weeks   Status Revised   PT LONG TERM GOAL #5   Title Improve FOTO to </= 47% limitation 12/26/15   Time 4   Period Weeks   Status Revised   PT LONG TERM GOAL #6   Title report pain decrease =/< 2/10 after a work day 12/26/15   Time 4   Period Weeks   Status Revised   PT LONG TERM GOAL #7   Title increase strength bilat LEs's =/< 4+/5 2/14/174   Time 4   Status Achieved               Plan - 12/02/15 0832    Clinical Impression Statement Pt making slow progress with Lt knee ROM.  Pt had good response to [place] and manual therapy, demonstrating decreased stiffness and improved gait at end of session.  Pain is limiting barrier at this time.    Pt will benefit from skilled therapeutic intervention in order to improve on the following deficits Abnormal gait;Decreased range of motion;Pain;Decreased scar mobility;Hypomobility;Decreased strength;Increased edema   Rehab Potential Excellent   PT Frequency 3x / week   PT Duration 4 weeks   PT Treatment/Interventions Ultrasound;Neuromuscular re-education;Scar mobilization;Biofeedback;Gait training;Patient/family education;Passive range of motion;Dry needling;Cryotherapy;Electrical Stimulation;Moist Heat;Therapeutic exercise;Manual techniques;Vasopneumatic Device   PT Next Visit Plan Continue progressive ROM and strengthening of Lt knee.    Consulted and Agree with Plan of Care Patient        Problem List Patient Active Problem List   Diagnosis Date Noted  . History of left knee replacement 11/25/2015  . Primary osteoarthritis of left knee 09/30/2015  . Primary osteoarthritis of knee 09/30/2015  . Primary  osteoarthritis of left wrist 07/28/2015  . Olecranon bursitis of right elbow 06/16/2015  . Hemorrhoid 06/12/2014  . Absolute anemia 06/12/2014  . Abnormal facial hair 08/03/2013  . Trochanteric bursitis of both hips 06/12/2013  . Low back pain 05/21/2013  . Osteoarthritis of both knees 05/04/2013  . Metatarsalgia of  left foot 05/04/2013  . Obesity 02/22/2012  . Migraine without aura 11/23/2011  . Chronic daily headache 11/23/2011  . Muscle spasm 11/23/2011  . Insomnia 11/23/2011  . RHEUMATOID ARTHRITIS 08/15/2008   Kerin Perna, PTA 12/02/2015 8:43 AM  [place], [place], [address] Phone: [phone]   Fax:  [phone]  Name: Rose Long MRN: [id number] Date of Birth: 10-27-1960

## 2015-12-01 NOTE — Addendum Note (Signed)
Addended by: Everardo All on: 12/01/2015 04:59 PM   Modules accepted: Orders

## 2015-12-03 ENCOUNTER — Ambulatory Visit (INDEPENDENT_AMBULATORY_CARE_PROVIDER_SITE_OTHER): Payer: Managed Care, Other (non HMO) | Admitting: Physical Therapy

## 2015-12-03 ENCOUNTER — Encounter: Payer: Managed Care, Other (non HMO) | Admitting: Physical Therapy

## 2015-12-03 ENCOUNTER — Encounter: Payer: Self-pay | Admitting: Physical Therapy

## 2015-12-03 DIAGNOSIS — R531 Weakness: Secondary | ICD-10-CM

## 2015-12-03 DIAGNOSIS — R2240 Localized swelling, mass and lump, unspecified lower limb: Secondary | ICD-10-CM | POA: Diagnosis not present

## 2015-12-03 DIAGNOSIS — R269 Unspecified abnormalities of gait and mobility: Secondary | ICD-10-CM | POA: Diagnosis not present

## 2015-12-03 DIAGNOSIS — M25562 Pain in left knee: Secondary | ICD-10-CM

## 2015-12-03 DIAGNOSIS — M25662 Stiffness of left knee, not elsewhere classified: Secondary | ICD-10-CM | POA: Diagnosis not present

## 2015-12-03 NOTE — Therapy (Signed)
[place], [place], [address] Phone: [phone]   Fax:  [phone]  Physical Therapy Treatment  Patient Details  Name: Rose Long MRN: [id number] Date of Birth: [birth year] Referring Provider: Dr. Melrose Nakayama   Encounter Date: 12/03/2015      PT End of Session - 12/03/15 1443    Visit Number 14   Number of Visits 24   Date for PT Re-Evaluation 12/26/15   PT Start Time [id number]   PT Stop Time 1553   PT Time Calculation (min) 68 min   Activity Tolerance Patient tolerated treatment well      Past Medical History  Diagnosis Date  . Arthritis   . Obesity   . Anemia   . Simple ovarian cyst     Right  . Complication of anesthesia     PONV  . Family history of adverse reaction to anesthesia     pt's mother stopped breathing possibly due to sleep apnea    Past Surgical History  Procedure Laterality Date  . 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 Nasal sinus surgery  1999  . Breast surgery  1998    reduction  . Uterine ablation  2012  . Under arm skin removed    . Breast surgery  10/2013    Lift  . Abdominoplasty/panniculectomy    . Total knee arthroplasty Left 09/30/2015    Procedure: TOTAL KNEE ARTHROPLASTY;  Surgeon: Melrose Nakayama, MD;  Location: [facility];  Service: Orthopedics;  Laterality: Left;    There were no vitals filed for this visit.  Visit Diagnosis:  Stiffness of knee joint, left  Abnormality of gait  Weakness generalized  Localized swelling of lower leg  Left knee pain      Subjective Assessment - 12/03/15 1446    Subjective Pt took medicine this morning, had her mom drive her today.  She feels her pain is inhibiting her from progress, so she plans to take medicine prior to therapy.  Her ankle is bothering her a little more today, but overall she feels like things are improving over the last 3 days.    Currently in Pain? Yes   Pain Score 4   took pain medicine 2.5  hrs prior to treatment.    Pain Location Knee   Pain Orientation Left   Pain Descriptors / Indicators Aching;Sore   Aggravating Factors  bending knee    Pain Relieving Factors ice, rest             [facility] PT Assessment - 12/03/15 0001    Assessment   Medical Diagnosis Lt TKA           [facility] Adult PT Treatment/Exercise - 12/03/15 0001    Knee/Hip Exercises: Aerobic   Stationary Bike L2 x 5   Knee/Hip Exercises: Machines for Strengthening   Cybex Knee Extension 1 plate:  knee ext with bilat and controlled descent with LLE (eccentric) x 10 reps, repeated with 2 plates.    Total Gym Leg Press LLE 4 plates x 10 reps x 2 sets   Knee/Hip Exercises: Standing   Heel Raises Both;2 sets;10 reps   Step Down Hand Hold: 2;10 reps;2 sets;Right  Heel taps, 3" step.    Step Down Limitations very challenging, heavy UE support   SLS Lt SLS without UE support x 10 sec x 4 trials. (improved)   Knee/Hip Exercises: Seated   Other Seated Knee/Hip Exercises seated scoots in chair to increase Lt  knee ROM x 8 reps  (up to 97 deg)    Knee/Hip Exercises: Prone   Hamstring Curl 2 sets;10 reps  2# on Lt ankle   Prone Knee Hang 2 minutes   Modalities   Modalities Electrical Stimulation;Vasopneumatic;Moist Heat   Moist Heat Therapy   Number Minutes Moist Heat 15 Minutes   Moist Heat Location Ankle   Electrical Stimulation   Electrical Stimulation Location Lt knee, Lt ankle   Electrical Stimulation Action ion repelling to knee and premod to ankle.    Electrical Stimulation Parameters to tolerance   Electrical Stimulation Goals Edema;Pain   Vasopneumatic   Number Minutes Vasopneumatic  15 minutes   Vasopnuematic Location  Knee   Vasopneumatic Pressure Low   Vasopneumatic Temperature  3 snowflakes    Manual Therapy   Manual Therapy Soft tissue mobilization;Myofascial release   Soft tissue mobilization pin and stretch to Lt lateral hamstring, midbelly hamstring.    Myofascial Release to Lt ITB,  quad, hamstring     gastroc stretch with heels off step x 30 sec x 2 reps          PT Long Term Goals - 12/01/15 1652    PT LONG TERM GOAL #1   Title Patient I in HEP 12/26/15   Time 4  added 3x/wk x 4 wk 11/28/15   Period Weeks   Status Revised   PT LONG TERM GOAL #2   Title increase Lt knee extension =/> 110 degrees to assit with stair climbing 12/26/15   Time 4   Period Weeks   Status Revised   PT LONG TERM GOAL #3   Title increase Lt knee extension =/< -1 degrees 12/26/15   Time 4   Period Weeks   Status Revised   PT LONG TERM GOAL #4   Title ambulate without an assistive device and minimal gait deviations 12/26/15   Time 4   Period Weeks   Status Revised   PT LONG TERM GOAL #5   Title Improve FOTO to </= 47% limitation 12/26/15   Time 4   Period Weeks   Status Revised   PT LONG TERM GOAL #6   Title report pain decrease =/< 2/10 after a work day 12/26/15   Time 4   Period Weeks   Status Revised   PT LONG TERM GOAL #7   Title increase strength bilat LEs's =/< 4+/5 2/14/174   Time 4   Status Achieved               Plan - 12/02/15 [id number]    Clinical Impression Statement Pt tolerated increased resistance with exercise and demonstrated improved SLS today.  Pain medication prior to therapy has increased exercise tolerance.  Improved Lt knee flexion with seated scoots up to 97 deg.  Progressing towards goals.    Pt will benefit from skilled therapeutic intervention in order to improve on the following deficits Abnormal gait;Decreased range of motion;Pain;Decreased scar mobility;Hypomobility;Decreased strength;Increased edema   Rehab Potential Excellent   PT Frequency 3x / week   PT Duration 4 weeks   PT Treatment/Interventions Ultrasound;Neuromuscular re-education;Scar mobilization;Biofeedback;Gait training;Patient/family education;Passive range of motion;Dry needling;Cryotherapy;Electrical Stimulation;Moist Heat;Therapeutic exercise;Manual techniques;Vasopneumatic  Device   PT Next Visit Plan Continue progressive ROM and strengthening of Lt knee.    Consulted and Agree with Plan of Care Patient        Problem List Patient Active Problem List   Diagnosis Date Noted  . History of left knee replacement 11/25/2015  . Primary osteoarthritis  of left knee 09/30/2015  . Primary osteoarthritis of knee 09/30/2015  . Primary osteoarthritis of left wrist 07/28/2015  . Olecranon bursitis of right elbow 06/16/2015  . Hemorrhoid 06/12/2014  . Absolute anemia 06/12/2014  . Abnormal facial hair 08/03/2013  . Trochanteric bursitis of both hips 06/12/2013  . Low back pain 05/21/2013  . Osteoarthritis of both knees 05/04/2013  . Metatarsalgia of left foot 05/04/2013  . Obesity 02/22/2012  . Migraine without aura 11/23/2011  . Chronic daily headache 11/23/2011  . Muscle spasm 11/23/2011  . Insomnia 11/23/2011  . RHEUMATOID ARTHRITIS 08/15/2008   Kerin Perna, PTA 12/03/2015 3:50 PM  [facility] [place], [place], [address] Phone: [phone]   Fax:  [phone]  Name: ELIJAH HIRTZ MRN: [id number] Date of Birth: [birth year]

## 2015-12-05 ENCOUNTER — Ambulatory Visit (INDEPENDENT_AMBULATORY_CARE_PROVIDER_SITE_OTHER): Payer: Managed Care, Other (non HMO) | Admitting: Physical Therapy

## 2015-12-05 DIAGNOSIS — R2240 Localized swelling, mass and lump, unspecified lower limb: Secondary | ICD-10-CM

## 2015-12-05 DIAGNOSIS — R531 Weakness: Secondary | ICD-10-CM

## 2015-12-05 DIAGNOSIS — M25562 Pain in left knee: Secondary | ICD-10-CM | POA: Diagnosis not present

## 2015-12-05 DIAGNOSIS — M25662 Stiffness of left knee, not elsewhere classified: Secondary | ICD-10-CM | POA: Diagnosis not present

## 2015-12-05 NOTE — Therapy (Signed)
Rose [facility] [place], [place], [address] Phone: [phone]   Fax:  [phone]  Physical Therapy Treatment  Patient Details  Name: Rose Long MRN: [id number] Date of Birth: 04/13/1961 Referring Provider: Dr, Melrose Nakayama  Encounter Date: 12/05/2015      PT End of Session - 12/05/15 1451    Visit Number 15   Number of Visits 24   Date for PT Re-Evaluation 12/26/15   PT Start Time 1450   PT Stop Time 1555   PT Time Calculation (min) 65 min   Activity Tolerance Patient tolerated treatment well      Past Medical History  Diagnosis Date  . Arthritis   . Obesity   . Anemia   . Simple ovarian cyst     Right  . Complication of anesthesia     PONV  . Family history of adverse reaction to anesthesia     pt's mother stopped breathing possibly due to sleep apnea    Past Surgical History  Procedure Laterality Date  . 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 Nasal sinus surgery  1999  . Breast surgery  1998    reduction  . Uterine ablation  2012  . Under arm skin removed    . Breast surgery  10/2013    Lift  . Abdominoplasty/panniculectomy    . Total knee arthroplasty Left 09/30/2015    Procedure: TOTAL KNEE ARTHROPLASTY;  Surgeon: Melrose Nakayama, MD;  Location: [place];  Service: Orthopedics;  Laterality: Left;    There were no vitals filed for this visit.  Visit Diagnosis:  Stiffness of knee joint, left  Weakness generalized  Localized swelling of lower leg  Left knee pain      Subjective Assessment - 12/05/15 1451    Subjective Pt reports she placed heat on hip to calf then "worked" it and she believes it helped.    Currently in Pain? Yes   Pain Score 4   took pain medicine less than hour prior to therapy   Pain Location Knee   Pain Orientation Left   Pain Descriptors / Indicators Aching;Sore            [facility] PT Assessment - 12/05/15 0001    Assessment   Medical Diagnosis Lt TKA     Referring Provider Dr, Melrose Nakayama   Onset Date/Surgical Date 09/29/16   Hand Dominance Left   Next MD Visit 12/17/15   AROM   AROM Assessment Site Knee   Right/Left Knee Left   Left Knee Flexion 100          [facility] Adult PT Treatment/Exercise - 12/05/15 0001    Knee/Hip Exercises: Stretches   Gastroc Stretch Left;30 seconds;4 reps   Knee/Hip Exercises: Aerobic   Stationary Bike L2 x 5   Knee/Hip Exercises: Machines for Strengthening   Cybex Knee Extension 2 plate:  knee ext with bilat and controlled descent with LLE (eccentric) x 10 reps, 2 sets   Knee/Hip Exercises: Standing   Side Lunges Both;1 set;10 reps;1 second   Terminal Knee Extension Limitations Lt TKE with 5 sec hold x 15 reps with green band behind knee    Step Down Right  heel taps to 3" step, then 3" cup. BUE support    Other Standing Knee Exercises standing high knee marching (with mirror feedback to improve hip compensation)    Knee/Hip Exercises: Seated   Other Seated Knee/Hip Exercises seated scoots in chair to increase Lt knee ROM  x 8 reps  (up to 100 deg) with sit to stand between reps    Modalities   Modalities Electrical Stimulation   Moist Heat Therapy   Number Minutes Moist Heat 15 Minutes   Moist Heat Location Ankle   Electrical Stimulation   Electrical Stimulation Location Lt hamstring and ITB; Lt ankle    Electrical Stimulation Action premod to each area   Electrical Stimulation Parameters to tolerance    Electrical Stimulation Goals Edema;Pain   Vasopneumatic   Number Minutes Vasopneumatic  15 minutes   Vasopnuematic Location  Knee   Vasopneumatic Pressure Medium   Vasopneumatic Temperature  3 snowflakes    Manual Therapy   Manual Therapy Taping   Kinesiotex Create Space;Edema   Kinesiotix   Edema Rock tape applied in a web pattern to [employer] decompressive strip on Lt quad and ITB             PT Long Term Goals - 12/05/15 1554    PT LONG TERM GOAL #1   Title  Patient I in HEP 12/26/15   Period Weeks   Status On-going   PT LONG TERM GOAL #2   Title increase Lt knee flexion =/> 110 degrees to assit with stair climbing 12/26/15   Time 4   Period Weeks   Status On-going   PT LONG TERM GOAL #3   Title increase Lt knee extension =/< -1 degrees 12/26/15   Time 4   Period Weeks   Status On-going   PT LONG TERM GOAL #4   Title ambulate without an assistive device and minimal gait deviations 12/26/15   Time 4   Period Weeks   Status On-going   PT LONG TERM GOAL #5   Title Improve FOTO to </= 47% limitation 12/26/15   Time 4   Period Weeks   Status On-going   PT LONG TERM GOAL #6   Title report pain decrease =/< 2/10 after a work day 12/26/15   Time 4   Period Weeks   Status On-going   PT LONG TERM GOAL #7   Title increase strength bilat LEs's =/< 4+/5 2/14/174   Time 4   Period Weeks   Status Achieved               Plan - 12/05/15 1551    Clinical Impression Statement Pt demonstrated increase in Lt knee flexion ROM.  Pt demonstrated improved control with heel taps to 3" step compared to previous visit.  Trial of rock tape applied to Lt knee to assist with pain and swelling reduction.    Pt will benefit from skilled therapeutic intervention in order to improve on the following deficits Abnormal gait;Decreased range of motion;Pain;Decreased scar mobility;Hypomobility;Decreased strength;Increased edema   Rehab Potential Excellent   PT Frequency 3x / week   PT Duration 4 weeks   PT Treatment/Interventions Ultrasound;Neuromuscular re-education;Scar mobilization;Biofeedback;Gait training;Patient/family education;Passive range of motion;Dry needling;Cryotherapy;Electrical Stimulation;Moist Heat;Therapeutic exercise;Manual techniques;Vasopneumatic Device   PT Next Visit Plan Continue progressive ROM and strengthening of Lt knee. Assess response to rock tape.    Consulted and Agree with Plan of Care Patient        Problem List Patient  Active Problem List   Diagnosis Date Noted  . History of left knee replacement 11/25/2015  . Primary osteoarthritis of left knee 09/30/2015  . Primary osteoarthritis of knee 09/30/2015  . Primary osteoarthritis of left wrist 07/28/2015  . Olecranon bursitis of right elbow 06/16/2015  .  Hemorrhoid 06/12/2014  . Absolute anemia 06/12/2014  . Abnormal facial hair 08/03/2013  . Trochanteric bursitis of both hips 06/12/2013  . Low back pain 05/21/2013  . Osteoarthritis of both knees 05/04/2013  . Metatarsalgia of left foot 05/04/2013  . Obesity 02/22/2012  . Migraine without aura 11/23/2011  . Chronic daily headache 11/23/2011  . Muscle spasm 11/23/2011  . Insomnia 11/23/2011  . RHEUMATOID ARTHRITIS 08/15/2008    Kerin Perna, PTA 12/05/2015 3:55 PM  [place], [place], [address] Phone: [phone]   Fax:  [phone]  Name: Rose Long MRN: [id number] Date of Birth: 03-Sep-1961

## 2015-12-08 ENCOUNTER — Ambulatory Visit (INDEPENDENT_AMBULATORY_CARE_PROVIDER_SITE_OTHER): Payer: Managed Care, Other (non HMO) | Admitting: Physical Therapy

## 2015-12-08 DIAGNOSIS — R531 Weakness: Secondary | ICD-10-CM

## 2015-12-08 DIAGNOSIS — M25662 Stiffness of left knee, not elsewhere classified: Secondary | ICD-10-CM

## 2015-12-08 DIAGNOSIS — R269 Unspecified abnormalities of gait and mobility: Secondary | ICD-10-CM

## 2015-12-08 DIAGNOSIS — M25562 Pain in left knee: Secondary | ICD-10-CM | POA: Diagnosis not present

## 2015-12-08 DIAGNOSIS — R2240 Localized swelling, mass and lump, unspecified lower limb: Secondary | ICD-10-CM | POA: Diagnosis not present

## 2015-12-08 NOTE — Therapy (Signed)
[place], [place], [address] Phone: [phone]   Fax:  [phone]  Physical Therapy Treatment  Patient Details  Name: Rose Long MRN: [id number] Date of Birth: February 12, 1961 Referring Provider: Dr, Melrose Nakayama  Encounter Date: 12/08/2015      PT End of Session - 12/08/15 1429    Visit Number 16   Number of Visits 24   Date for PT Re-Evaluation 12/26/15   PT Start Time 1429   PT Stop Time 1533   PT Time Calculation (min) 64 min   Activity Tolerance Patient tolerated treatment well      Past Medical History  Diagnosis Date  . Arthritis   . Obesity   . Anemia   . Simple ovarian cyst     Right  . Complication of anesthesia     PONV  . Family history of adverse reaction to anesthesia     pt's mother stopped breathing possibly due to sleep apnea    Past Surgical History  Procedure Laterality Date  . 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 Nasal sinus surgery  1999  . Breast surgery  1998    reduction  . Uterine ablation  2012  . Under arm skin removed    . Breast surgery  10/2013    Lift  . Abdominoplasty/panniculectomy    . Total knee arthroplasty Left 09/30/2015    Procedure: TOTAL KNEE ARTHROPLASTY;  Surgeon: Melrose Nakayama, MD;  Location: [place];  Service: Orthopedics;  Laterality: Left;    There were no vitals filed for this visit.  Visit Diagnosis:  Stiffness of knee joint, left  Weakness generalized  Localized swelling of lower leg  Left knee pain  Abnormality of gait      Subjective Assessment - 12/08/15 1430    Subjective Pt reports that work is a barrier because she has her LE in a dependent position. She thinks the tape is helping her with pain and wants to keep it on she feels more supported and less wobbly.    Currently in Pain? Yes   Pain Score 5    Pain Orientation Left;Lateral   Pain Type Surgical pain   Pain Onset More than a month ago   Pain Frequency  Constant   Aggravating Factors  bending her knee   Pain Relieving Factors medicine, ice, rest            [facility] PT Assessment - 12/08/15 0001    Assessment   Medical Diagnosis Lt TKA    Onset Date/Surgical Date 09/29/16   AROM   AROM Assessment Site Knee   Right/Left Knee Left   Left Knee Flexion 102  in sitting, supine 96                     [facility] Adult PT Treatment/Exercise - 12/08/15 0001    Knee/Hip Exercises: Stretches   Other Knee/Hip Stretches s/l ITB stretch   Knee/Hip Exercises: Aerobic   Stationary Bike L2 x 5   Knee/Hip Exercises: Standing   Wall Squat 3 sets;10 reps  VC for form and equal wt shift   Knee/Hip Exercises: Seated   Other Seated Knee/Hip Exercises seted knee flexion on edge of mat   Knee/Hip Exercises: Sidelying   Clams 3x10 reverse clams Lt with 2#  regular with 2#    Modalities   Modalities Electrical Stimulation;Moist Heat;Vasopneumatic   Moist Heat Therapy   Number Minutes Moist Heat 15 Minutes  Moist Heat Location Ankle  Lt   Electrical Stimulation   Electrical Stimulation Location Lt ITB, Lt knee   Electrical Stimulation Action premod to ITB, ion repelling to knee   Electrical Stimulation Parameters to tolerance   Electrical Stimulation Goals Pain;Edema;Tone   Vasopneumatic   Number Minutes Vasopneumatic  15 minutes   Vasopnuematic Location  Knee   Vasopneumatic Pressure High   Vasopneumatic Temperature  3 snowflakes    Manual Therapy   Manual Therapy Taping;Joint mobilization   Joint Mobilization Lt knee for flexion, grade III prone, supine & post tib mobs   Kinesiotex --  re-applied top layer                     PT Long Term Goals - 12/08/15 1504    PT LONG TERM GOAL #1   Title Patient I in HEP 12/26/15   Status On-going   PT LONG TERM GOAL #2   Title increase Lt knee flexion =/> 110 degrees to assit with stair climbing 12/26/15   Status On-going   PT LONG TERM GOAL #3   Title increase Lt knee  extension =/< -1 degrees 12/26/15   Status On-going   PT LONG TERM GOAL #4   Title ambulate without an assistive device and minimal gait deviations 12/26/15   Status Achieved   PT LONG TERM GOAL #5   Title Improve FOTO to </= 47% limitation 12/26/15   Status On-going   PT LONG TERM GOAL #6   Title report pain decrease =/< 2/10 after a work day 12/26/15   Status On-going   PT LONG TERM GOAL #7   Title increase strength bilat LEs's =/< 4+/5 2/14/174   Status Achieved               Plan - 12/08/15 1524    Clinical Impression Statement Kathy's knee is still tight, however she is slowly increasing her motion. She is progressing to her goals. She is tolerating mobs better now that she is taking medication.    Pt will benefit from skilled therapeutic intervention in order to improve on the following deficits Abnormal gait;Decreased range of motion;Pain;Decreased scar mobility;Hypomobility;Decreased strength;Increased edema   Rehab Potential Excellent   PT Frequency 3x / week   PT Duration 4 weeks   PT Treatment/Interventions Ultrasound;Neuromuscular re-education;Scar mobilization;Biofeedback;Gait training;Patient/family education;Passive range of motion;Dry needling;Cryotherapy;Electrical Stimulation;Moist Heat;Therapeutic exercise;Manual techniques;Vasopneumatic Device   PT Next Visit Plan Continue progressive ROM and strengthening of Lt knee. Assess response to rock tape.    Consulted and Agree with Plan of Care Patient        Problem List Patient Active Problem List   Diagnosis Date Noted  . History of left knee replacement 11/25/2015  . Primary osteoarthritis of left knee 09/30/2015  . Primary osteoarthritis of knee 09/30/2015  . Primary osteoarthritis of left wrist 07/28/2015  . Olecranon bursitis of right elbow 06/16/2015  . Hemorrhoid 06/12/2014  . Absolute anemia 06/12/2014  . Abnormal facial hair 08/03/2013  . Trochanteric bursitis of both hips 06/12/2013  . Low back  pain 05/21/2013  . Osteoarthritis of both knees 05/04/2013  . Metatarsalgia of left foot 05/04/2013  . Obesity 02/22/2012  . Migraine without aura 11/23/2011  . Chronic daily headache 11/23/2011  . Muscle spasm 11/23/2011  . Insomnia 11/23/2011  . RHEUMATOID ARTHRITIS 08/15/2008    Jeral Pinch PT  12/08/2015, 3:28 PM  [facility] [place], [place], [address] Phone: [phone]  Fax:  [phone]  Name: LOKI ESCARCEGA MRN: [id number] Date of Birth: 12-16-1960

## 2015-12-10 ENCOUNTER — Encounter: Payer: Managed Care, Other (non HMO) | Admitting: Physical Therapy

## 2015-12-10 ENCOUNTER — Encounter: Payer: Self-pay | Admitting: Physical Therapy

## 2015-12-10 ENCOUNTER — Ambulatory Visit (INDEPENDENT_AMBULATORY_CARE_PROVIDER_SITE_OTHER): Payer: Managed Care, Other (non HMO) | Admitting: Physical Therapy

## 2015-12-10 DIAGNOSIS — M25562 Pain in left knee: Secondary | ICD-10-CM | POA: Diagnosis not present

## 2015-12-10 DIAGNOSIS — R2240 Localized swelling, mass and lump, unspecified lower limb: Secondary | ICD-10-CM

## 2015-12-10 DIAGNOSIS — R531 Weakness: Secondary | ICD-10-CM

## 2015-12-10 DIAGNOSIS — M25662 Stiffness of left knee, not elsewhere classified: Secondary | ICD-10-CM | POA: Diagnosis not present

## 2015-12-10 DIAGNOSIS — R269 Unspecified abnormalities of gait and mobility: Secondary | ICD-10-CM

## 2015-12-10 NOTE — Therapy (Signed)
[place], [place], [address] Phone: [phone]   Fax:  [phone]  Physical Therapy Treatment  Patient Details  Name: Rose Long MRN: [id number] Date of Birth: 22-Sep-1961 Referring Provider: Dr, Melrose Nakayama  Encounter Date: 12/10/2015      PT End of Session - 12/10/15 1519    Visit Number 17   Number of Visits 24   Date for PT Re-Evaluation 12/26/15   PT Start Time [id number]   PT Stop Time 1619   PT Time Calculation (min) 63 min   Activity Tolerance Patient tolerated treatment well      Past Medical History  Diagnosis Date  . Arthritis   . Obesity   . Anemia   . Simple ovarian cyst     Right  . Complication of anesthesia     PONV  . Family history of adverse reaction to anesthesia     pt's mother stopped breathing possibly due to sleep apnea    Past Surgical History  Procedure Laterality Date  . 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 Nasal sinus surgery  1999  . Breast surgery  1998    reduction  . Uterine ablation  2012  . Under arm skin removed    . Breast surgery  10/2013    Lift  . Abdominoplasty/panniculectomy    . Total knee arthroplasty Left 09/30/2015    Procedure: TOTAL KNEE ARTHROPLASTY;  Surgeon: Melrose Nakayama, MD;  Location: [place];  Service: Orthopedics;  Laterality: Left;    There were no vitals filed for this visit.  Visit Diagnosis:  Stiffness of knee joint, left  Weakness generalized  Localized swelling of lower leg  Left knee pain  Abnormality of gait      Subjective Assessment - 12/10/15 1522    Subjective [name] reports she has been having some Lt hamstring pain with being still to the point where she has had to use her hands to lift the leg.    Currently in Pain? Yes   Pain Score 4    Pain Location Knee   Pain Orientation Left                         [facility] Adult PT Treatment/Exercise - 12/10/15 0001    Knee/Hip Exercises:  Stretches   Gastroc Stretch Left;30 seconds   Knee/Hip Exercises: Aerobic   Stationary Bike L3x6'   Knee/Hip Exercises: Standing   Other Standing Knee Exercises stand to sit Lt LE to high bed   Other Standing Knee Exercises hip hiking Rt off step x 15    Knee/Hip Exercises: Supine   Bridges Limitations 2x10 single leg Lt    Modalities   Modalities Electrical Stimulation;Vasopneumatic;Moist Heat   Moist Heat Therapy   Number Minutes Moist Heat 15 Minutes   Moist Heat Location Ankle  Lt   Electrical Stimulation   Electrical Stimulation Location Lt knee   Electrical Stimulation Action IFC   Electrical Stimulation Parameters  to tolerance   Electrical Stimulation Goals Pain   Ultrasound   Ultrasound Location post Lt knee   Ultrasound Parameters 100% 1.63mHz, 1.5w/cm2   Ultrasound Goals Pain   Vasopneumatic   Number Minutes Vasopneumatic  15 minutes   Vasopnuematic Location  Knee   Vasopneumatic Pressure High   Vasopneumatic Temperature  3 snowflakes    Manual Therapy   Manual Therapy Soft tissue mobilization   Soft tissue mobilization posterior Lt knee  Trigger Point Dry Needling - 12/10/15 1653    Consent Given? Yes   Education Handout Provided No   Muscles Treated Lower Body Hamstring   Hamstring Response Palpable increased muscle length;Twitch response elicited                   PT Long Term Goals - 12/08/15 1504    PT LONG TERM GOAL #1   Title Patient I in HEP 12/26/15   Status On-going   PT LONG TERM GOAL #2   Title increase Lt knee flexion =/> 110 degrees to assit with stair climbing 12/26/15   Status On-going   PT LONG TERM GOAL #3   Title increase Lt knee extension =/< -1 degrees 12/26/15   Status On-going   PT LONG TERM GOAL #4   Title ambulate without an assistive device and minimal gait deviations 12/26/15   Status Achieved   PT LONG TERM GOAL #5   Title Improve FOTO to </= 47% limitation 12/26/15   Status On-going   PT LONG TERM GOAL  #6   Title report pain decrease =/< 2/10 after a work day 12/26/15   Status On-going   PT LONG TERM GOAL #7   Title increase strength bilat LEs's =/< 4+/5 2/14/174   Status Achieved               Plan - 12/10/15 1654    Clinical Impression Statement Pt responded well to treatment to increase flexibility in the back of her knee, she also had decreased pain after tx.    Pt will benefit from skilled therapeutic intervention in order to improve on the following deficits Abnormal gait;Decreased range of motion;Pain;Decreased scar mobility;Hypomobility;Decreased strength;Increased edema   Rehab Potential Excellent   PT Frequency 3x / week   PT Duration 4 weeks   PT Treatment/Interventions Ultrasound;Neuromuscular re-education;Scar mobilization;Biofeedback;Gait training;Patient/family education;Passive range of motion;Dry needling;Cryotherapy;Electrical Stimulation;Moist Heat;Therapeutic exercise;Manual techniques;Vasopneumatic Device   PT Next Visit Plan Continue progressive ROM and strengthening of Lt knee. Assess response to rock tape.    Consulted and Agree with Plan of Care Patient        Problem List Patient Active Problem List   Diagnosis Date Noted  . History of left knee replacement 11/25/2015  . Primary osteoarthritis of left knee 09/30/2015  . Primary osteoarthritis of knee 09/30/2015  . Primary osteoarthritis of left wrist 07/28/2015  . Olecranon bursitis of right elbow 06/16/2015  . Hemorrhoid 06/12/2014  . Absolute anemia 06/12/2014  . Abnormal facial hair 08/03/2013  . Trochanteric bursitis of both hips 06/12/2013  . Low back pain 05/21/2013  . Osteoarthritis of both knees 05/04/2013  . Metatarsalgia of left foot 05/04/2013  . Obesity 02/22/2012  . Migraine without aura 11/23/2011  . Chronic daily headache 11/23/2011  . Muscle spasm 11/23/2011  . Insomnia 11/23/2011  . RHEUMATOID ARTHRITIS 08/15/2008    Jeral Pinch PT 12/10/2015, 4:55 PM  [facility] [place], [place], [address] Phone: [phone]   Fax:  [phone]  Name: Rose Long MRN: [id number] Date of Birth: 07/10/1961

## 2015-12-12 ENCOUNTER — Encounter: Payer: Self-pay | Admitting: Rehabilitative and Restorative Service Providers"

## 2015-12-12 ENCOUNTER — Ambulatory Visit (INDEPENDENT_AMBULATORY_CARE_PROVIDER_SITE_OTHER): Payer: Managed Care, Other (non HMO) | Admitting: Rehabilitative and Restorative Service Providers"

## 2015-12-12 DIAGNOSIS — R269 Unspecified abnormalities of gait and mobility: Secondary | ICD-10-CM

## 2015-12-12 DIAGNOSIS — R531 Weakness: Secondary | ICD-10-CM | POA: Diagnosis not present

## 2015-12-12 DIAGNOSIS — M25662 Stiffness of left knee, not elsewhere classified: Secondary | ICD-10-CM

## 2015-12-12 DIAGNOSIS — R2240 Localized swelling, mass and lump, unspecified lower limb: Secondary | ICD-10-CM

## 2015-12-12 DIAGNOSIS — M25562 Pain in left knee: Secondary | ICD-10-CM | POA: Diagnosis not present

## 2015-12-12 NOTE — Therapy (Signed)
[place], [place], [address] Phone: [phone]   Fax:  [phone]  Physical Therapy Treatment  Patient Details  Name: Rose Long MRN: [id number] Date of Birth: 1961-02-23 Referring Provider: Dr, Melrose Nakayama  Encounter Date: 12/12/2015      PT End of Session - 12/12/15 1531    Visit Number 18   Number of Visits 24   Date for PT Re-Evaluation 12/26/15   PT Start Time 1527   PT Stop Time 1632   PT Time Calculation (min) 65 min   Activity Tolerance Patient tolerated treatment well      Past Medical History  Diagnosis Date  . Arthritis   . Obesity   . Anemia   . Simple ovarian cyst     Right  . Complication of anesthesia     PONV  . Family history of adverse reaction to anesthesia     pt's mother stopped breathing possibly due to sleep apnea    Past Surgical History  Procedure Laterality Date  . 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 Nasal sinus surgery  1999  . Breast surgery  1998    reduction  . Uterine ablation  2012  . Under arm skin removed    . Breast surgery  10/2013    Lift  . Abdominoplasty/panniculectomy    . Total knee arthroplasty Left 09/30/2015    Procedure: TOTAL KNEE ARTHROPLASTY;  Surgeon: Melrose Nakayama, MD;  Location: [place] [facility];  Service: Orthopedics;  Laterality: Left;    There were no vitals filed for this visit.  Visit Diagnosis:  Stiffness of knee joint, left  Weakness generalized  Localized swelling of lower leg  Left knee pain  Abnormality of gait                       [facility] Adult PT Treatment/Exercise - 12/12/15 0001    Knee/Hip Exercises: Stretches   Passive Hamstring Stretch 3 reps;60 seconds  sitting with heel propped on table 4# on distal thigh   Quad Stretch Left;3 reps  prone with strap   Gastroc Stretch Left;30 seconds   Knee/Hip Exercises: Aerobic   Stationary Bike L3x6'   Knee/Hip Exercises: Machines for  Strengthening   Cybex Knee Extension 2 plate:  knee ext with bilat and controlled descent with LLE (eccentric) x 10 reps, 2 sets   Total Gym Leg Press LLE 4 plates x 10 reps x 2 sets   Knee/Hip Exercises: Supine   Heel Prop for Knee Extension 2 minutes  4# wt distal thigh   Modalities   Modalities Electrical Stimulation;Vasopneumatic;Moist Heat   Electrical Stimulation   Electrical Stimulation Location Lt knee   Electrical Stimulation Action IFC   Electrical Stimulation Parameters to tolerance   Electrical Stimulation Goals Pain   Vasopneumatic   Number Minutes Vasopneumatic  15 minutes   Vasopnuematic Location  Knee   Vasopneumatic Pressure High   Vasopneumatic Temperature  3 snowflakes    Manual Therapy   Manual Therapy Soft tissue mobilization   Soft tissue mobilization posterior Lt knee/quads   Passive ROM into Lt knee flexion (with pt supine) and Lt knee into ext with overpressure, to tolerance    Kinesiotex --  tape remains in place for edema                     PT Long Term Goals - 12/08/15 1504    PT LONG TERM GOAL #1  Title Patient I in HEP 12/26/15   Status On-going   PT LONG TERM GOAL #2   Title increase Lt knee flexion =/> 110 degrees to assit with stair climbing 12/26/15   Status On-going   PT LONG TERM GOAL #3   Title increase Lt knee extension =/< -1 degrees 12/26/15   Status On-going   PT LONG TERM GOAL #4   Title ambulate without an assistive device and minimal gait deviations 12/26/15   Status Achieved   PT LONG TERM GOAL #5   Title Improve FOTO to </= 47% limitation 12/26/15   Status On-going   PT LONG TERM GOAL #6   Title report pain decrease =/< 2/10 after a work day 12/26/15   Status On-going   PT LONG TERM GOAL #7   Title increase strength bilat LEs's =/< 4+/5 2/14/174   Status Achieved               Plan - 12/12/15 1625    Clinical Impression Statement Patient reports some increased soreness in the hip and knee which may be  from the increse in work in PT or walking more without assistive device. Feels looser post treatment. pt continues to progress toward goals of therapy. Returns to MD next Wed 12/17/15   Pt will benefit from skilled therapeutic intervention in order to improve on the following deficits Abnormal gait;Decreased range of motion;Pain;Decreased scar mobility;Hypomobility;Decreased strength;Increased edema   Rehab Potential Excellent   PT Frequency 3x / week   PT Duration 4 weeks   PT Treatment/Interventions Ultrasound;Neuromuscular re-education;Scar mobilization;Biofeedback;Gait training;Patient/family education;Passive range of motion;Dry needling;Cryotherapy;Electrical Stimulation;Moist Heat;Therapeutic exercise;Manual techniques;Vasopneumatic Device   PT Next Visit Plan Continue progressive ROM and strengthening of Lt knee. cont  rock tape as indicated.    PT Home Exercise Plan HEP pt will try to carry some of her work items in a book bag instead of so many bags going in and out of work daily.    Consulted and Agree with Plan of Care Patient        Problem List Patient Active Problem List   Diagnosis Date Noted  . History of left knee replacement 11/25/2015  . Primary osteoarthritis of left knee 09/30/2015  . Primary osteoarthritis of knee 09/30/2015  . Primary osteoarthritis of left wrist 07/28/2015  . Olecranon bursitis of right elbow 06/16/2015  . Hemorrhoid 06/12/2014  . Absolute anemia 06/12/2014  . Abnormal facial hair 08/03/2013  . Trochanteric bursitis of both hips 06/12/2013  . Low back pain 05/21/2013  . Osteoarthritis of both knees 05/04/2013  . Metatarsalgia of left foot 05/04/2013  . Obesity 02/22/2012  . Migraine without aura 11/23/2011  . Chronic daily headache 11/23/2011  . Muscle spasm 11/23/2011  . Insomnia 11/23/2011  . RHEUMATOID ARTHRITIS 08/15/2008    Zhane Donlan Nilda Simmer PT, MPH  12/12/2015, 4:29 PM  [facility]  [place], [place], [address] Phone: [phone]   Fax:  [phone]  Name: Rose Long MRN: [id number] Date of Birth: 1961-01-16

## 2015-12-15 ENCOUNTER — Ambulatory Visit (INDEPENDENT_AMBULATORY_CARE_PROVIDER_SITE_OTHER): Payer: Managed Care, Other (non HMO) | Admitting: Physical Therapy

## 2015-12-15 DIAGNOSIS — M25562 Pain in left knee: Secondary | ICD-10-CM

## 2015-12-15 DIAGNOSIS — R531 Weakness: Secondary | ICD-10-CM | POA: Diagnosis not present

## 2015-12-15 DIAGNOSIS — R2240 Localized swelling, mass and lump, unspecified lower limb: Secondary | ICD-10-CM | POA: Diagnosis not present

## 2015-12-15 DIAGNOSIS — M25662 Stiffness of left knee, not elsewhere classified: Secondary | ICD-10-CM

## 2015-12-15 NOTE — Therapy (Signed)
[place], [place], [address] Phone: [phone]   Fax:  [phone]  Physical Therapy Treatment  Patient Details  Name: Rose Long MRN: [id number] Date of Birth: 07-11-1961 Referring Provider: Dr, Melrose Nakayama  Encounter Date: 12/15/2015      PT End of Session - 12/15/15 1110    Visit Number 19   Number of Visits 24   Date for PT Re-Evaluation 12/26/15   PT Start Time 1105   PT Stop Time 1204   PT Time Calculation (min) 59 min      Past Medical History  Diagnosis Date  . Arthritis   . Obesity   . Anemia   . Simple ovarian cyst     Right  . Complication of anesthesia     PONV  . Family history of adverse reaction to anesthesia     pt's mother stopped breathing possibly due to sleep apnea    Past Surgical History  Procedure Laterality Date  . 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 Nasal sinus surgery  1999  . Breast surgery  1998    reduction  . Uterine ablation  2012  . Under arm skin removed    . Breast surgery  10/2013    Lift  . Abdominoplasty/panniculectomy    . Total knee arthroplasty Left 09/30/2015    Procedure: TOTAL KNEE ARTHROPLASTY;  Surgeon: Melrose Nakayama, MD;  Location: [place];  Service: Orthopedics;  Laterality: Left;    There were no vitals filed for this visit.  Visit Diagnosis:  Stiffness of knee joint, left  Weakness generalized  Localized swelling of lower leg  Left knee pain      Subjective Assessment - 12/15/15 1149    Subjective Pt reports she has been working on Lt knee extension over the weekend.  otherwise nothing new to report.    Currently in Pain? Yes   Pain Score 4    Pain Location Knee   Pain Orientation Left   Pain Descriptors / Indicators Aching   Aggravating Factors  bending her knee   Pain Relieving Factors ice, rest, medicine            [facility] PT Assessment - 12/15/15 0001    Assessment   Medical Diagnosis Lt TKA    AROM    AROM Assessment Site Knee   Right/Left Knee Left   Left Knee Extension -3  after manual therapy          [facility] Adult PT Treatment/Exercise - 12/15/15 0001    Knee/Hip Exercises: Stretches   Passive Hamstring Stretch Left;3 reps;30 seconds   Gastroc Stretch Left;2 reps;30 seconds   Other Knee/Hip Stretches Lt ITB stretch / Lt adductor stretch supine with strap x 30 sec x 2 reps    Knee/Hip Exercises: Aerobic   Stationary Bike L4: 5 min    Knee/Hip Exercises: Machines for Strengthening   Cybex Knee Extension 2 plate:  knee ext with bilat and controlled descent with LLE (eccentric) x 4 reps, moved to 3 plates x 10 reps x 2 sets   Total Gym Leg Press LLE 5 plates x 10 reps x 2 sets   Knee/Hip Exercises: Seated   Other Seated Knee/Hip Exercises seated scoots in chair to increase Lt knee ROM x 8 reps  (up to 97 deg) with sit to stand between reps    Acupuncturist Location Lt knee   Electrical Stimulation Action IFC   Electrical  Stimulation Parameters to tolerance    Electrical Stimulation Goals Pain   Vasopneumatic   Number Minutes Vasopneumatic  15 minutes   Vasopnuematic Location  Knee   Vasopneumatic Pressure High   Vasopneumatic Temperature  3 snowflakes    Manual Therapy   Manual Therapy Passive ROM;Taping   Passive ROM into Lt knee (with pt supine)  ext with overpressure, to tolerance    Kinesiotex Create Space;Edema   Kinesiotix   Edema Rock tape applied in a web pattern to [employer] decompressive strip on Lt quad           PT Long Term Goals - 12/08/15 1504    PT LONG TERM GOAL #1   Title Patient I in HEP 12/26/15   Status On-going   PT LONG TERM GOAL #2   Title increase Lt knee flexion =/> 110 degrees to assit with stair climbing 12/26/15   Status On-going   PT LONG TERM GOAL #3   Title increase Lt knee extension =/< -1 degrees 12/26/15   Status On-going   PT LONG TERM GOAL #4   Title ambulate without an assistive  device and minimal gait deviations 12/26/15   Status Achieved   PT LONG TERM GOAL #5   Title Improve FOTO to </= 47% limitation 12/26/15   Status On-going   PT LONG TERM GOAL #6   Title report pain decrease =/< 2/10 after a work day 12/26/15   Status On-going   PT LONG TERM GOAL #7   Title increase strength bilat LEs's =/< 4+/5 2/14/174   Status Achieved               Plan - 12/15/15 1150    Clinical Impression Statement Pt tolerated increase in resistance with LE exercises. Pt reported decreased pain and swelling with use of rock tape as well as vaso/estim.  Pt progressing towards established goals.    Pt will benefit from skilled therapeutic intervention in order to improve on the following deficits Abnormal gait;Decreased range of motion;Pain;Decreased scar mobility;Hypomobility;Decreased strength;Increased edema   Rehab Potential Excellent   PT Frequency 3x / week   PT Duration 4 weeks   PT Treatment/Interventions Ultrasound;Neuromuscular re-education;Scar mobilization;Biofeedback;Gait training;Patient/family education;Passive range of motion;Dry needling;Cryotherapy;Electrical Stimulation;Moist Heat;Therapeutic exercise;Manual techniques;Vasopneumatic Device   PT Next Visit Plan Continue progressive ROM and strengthening of Lt knee. cont  rock tape as indicated.    Consulted and Agree with Plan of Care Patient        Problem List Patient Active Problem List   Diagnosis Date Noted  . History of left knee replacement 11/25/2015  . Primary osteoarthritis of left knee 09/30/2015  . Primary osteoarthritis of knee 09/30/2015  . Primary osteoarthritis of left wrist 07/28/2015  . Olecranon bursitis of right elbow 06/16/2015  . Hemorrhoid 06/12/2014  . Absolute anemia 06/12/2014  . Abnormal facial hair 08/03/2013  . Trochanteric bursitis of both hips 06/12/2013  . Low back pain 05/21/2013  . Osteoarthritis of both knees 05/04/2013  . Metatarsalgia of left foot 05/04/2013  .  Obesity 02/22/2012  . Migraine without aura 11/23/2011  . Chronic daily headache 11/23/2011  . Muscle spasm 11/23/2011  . Insomnia 11/23/2011  . RHEUMATOID ARTHRITIS 08/15/2008    Kerin Perna, PTA 12/15/2015 11:54 AM  [facility] [place], [place], [address] Phone: [phone]   Fax:  [phone]  Name: Rose Long MRN: [id number] Date of Birth: 1960-12-14

## 2015-12-17 ENCOUNTER — Ambulatory Visit (INDEPENDENT_AMBULATORY_CARE_PROVIDER_SITE_OTHER): Payer: Managed Care, Other (non HMO) | Admitting: Physical Therapy

## 2015-12-17 ENCOUNTER — Encounter: Payer: Self-pay | Admitting: Physical Therapy

## 2015-12-17 DIAGNOSIS — M25662 Stiffness of left knee, not elsewhere classified: Secondary | ICD-10-CM

## 2015-12-17 DIAGNOSIS — R2240 Localized swelling, mass and lump, unspecified lower limb: Secondary | ICD-10-CM | POA: Diagnosis not present

## 2015-12-17 DIAGNOSIS — R531 Weakness: Secondary | ICD-10-CM

## 2015-12-17 DIAGNOSIS — M25562 Pain in left knee: Secondary | ICD-10-CM | POA: Diagnosis not present

## 2015-12-17 DIAGNOSIS — R269 Unspecified abnormalities of gait and mobility: Secondary | ICD-10-CM

## 2015-12-17 NOTE — Therapy (Signed)
[place], [place], [address] Phone: [phone]   Fax:  [phone]  Physical Therapy Treatment  Patient Details  Name: Rose Long MRN: [id number] Date of Birth: Jul 01, 1961 Referring Provider: Dr Tori Milks  Encounter Date: 12/17/2015      PT End of Session - 12/17/15 1024    Visit Number 20   Number of Visits 24   Date for PT Re-Evaluation 12/26/15   PT Start Time [id number]   PT Stop Time 1121   PT Time Calculation (min) 67 min   Activity Tolerance Patient tolerated treatment well      Past Medical History  Diagnosis Date  . Arthritis   . Obesity   . Anemia   . Simple ovarian cyst     Right  . Complication of anesthesia     PONV  . Family history of adverse reaction to anesthesia     pt's mother stopped breathing possibly due to sleep apnea    Past Surgical History  Procedure Laterality Date  . 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 Nasal sinus surgery  1999  . Breast surgery  1998    reduction  . Uterine ablation  2012  . Under arm skin removed    . Breast surgery  10/2013    Lift  . Abdominoplasty/panniculectomy    . Total knee arthroplasty Left 09/30/2015    Procedure: TOTAL KNEE ARTHROPLASTY;  Surgeon: Melrose Nakayama, MD;  Location: [place];  Service: Orthopedics;  Laterality: Left;    There were no vitals filed for this visit.  Visit Diagnosis:  Stiffness of knee joint, left  Weakness generalized  Localized swelling of lower leg  Left knee pain  Abnormality of gait      Subjective Assessment - 12/17/15 1018    Subjective Pt reports some lateral Lt thigh pain, was able to tolerate increased resistance on machines last week without difficulty.    Patient Stated Goals get motion back so no need for manipulation, exercise again and decrease pain.    Currently in Pain? Yes   Pain Score 4    Pain Location Knee   Pain Orientation Left   Pain Descriptors / Indicators  Aching;Dull   Pain Type Surgical pain   Pain Radiating Towards lateral thigh   Pain Onset More than a month ago   Aggravating Factors  prolonged sitting and dependent positioning of the leg.    Pain Relieving Factors elevation, medication             [facility] PT Assessment - 12/17/15 0001    Assessment   Medical Diagnosis Lt TKA    Referring Provider Dr Tori Milks   Onset Date/Surgical Date 09/29/16   Hand Dominance Left   Next MD Visit 12/17/15   Observation/Other Assessments   Focus on Therapeutic Outcomes (FOTO)  49% limited   AROM   AROM Assessment Site Knee   Right/Left Knee Left   Left Knee Extension -1   Left Knee Flexion 99  spine, sitting in chair 101   PROM   Left Knee Flexion 103  in supine,                      [facility] Adult PT Treatment/Exercise - 12/17/15 0001    Knee/Hip Exercises: Aerobic   Stationary Bike L4: 5 min    Knee/Hip Exercises: Seated   Long Arc Quad --  TKE's with ball, minimal quad contraction .  Ball Squeeze self mobs into flexion    Knee/Hip Exercises: Supine   [employer];Left  with e-stim for ms re-ed, 5sec/5sec.   Modalities   Modalities Electrical Stimulation;Vasopneumatic;Moist Heat   Moist Heat Therapy   Number Minutes Moist Heat 15 Minutes   Moist Heat Location Ankle  Lt and calf   Electrical Stimulation   Electrical Stimulation Location Lt knee   Electrical Stimulation Action ion repelling   Electrical Stimulation Parameters to tolerance   Electrical Stimulation Goals Edema;Pain   Vasopneumatic   Number Minutes Vasopneumatic  15 minutes   Vasopnuematic Location  Knee   Vasopneumatic Pressure High   Vasopneumatic Temperature  3 snowflakes    Manual Therapy   Manual Therapy Soft tissue mobilization   Soft tissue mobilization Lt calf           Trigger Point Dry Needling - 12/17/15 1050    Consent Given? Yes   Education Handout Provided No   Muscles Treated Lower Body Gastrocnemius;Hamstring  Lt    Hamstring Response Palpable increased muscle length;Twitch response elicited   Gastrocnemius Response Palpable increased muscle length;Twitch response elicited                   PT Long Term Goals - 12/17/15 1108    PT LONG TERM GOAL #1   Title Patient I in HEP 12/26/15   Status On-going   PT LONG TERM GOAL #2   Title increase Lt knee flexion =/> 110 degrees to assit with stair climbing 12/26/15   Status On-going   PT LONG TERM GOAL #3   Title increase Lt knee extension =/< -1 degrees 12/26/15   Status Achieved   PT LONG TERM GOAL #4   Title ambulate without an assistive device and minimal gait deviations 12/26/15   Status Achieved   PT LONG TERM GOAL #5   Title Improve FOTO to </= 47% limitation 12/26/15   Status On-going  49% limited               Problem List Patient Active Problem List   Diagnosis Date Noted  . History of left knee replacement 11/25/2015  . Primary osteoarthritis of left knee 09/30/2015  . Primary osteoarthritis of knee 09/30/2015  . Primary osteoarthritis of left wrist 07/28/2015  . Olecranon bursitis of right elbow 06/16/2015  . Hemorrhoid 06/12/2014  . Absolute anemia 06/12/2014  . Abnormal facial hair 08/03/2013  . Trochanteric bursitis of both hips 06/12/2013  . Low back pain 05/21/2013  . Osteoarthritis of both knees 05/04/2013  . Metatarsalgia of left foot 05/04/2013  . Obesity 02/22/2012  . Migraine without aura 11/23/2011  . Chronic daily headache 11/23/2011  . Muscle spasm 11/23/2011  . Insomnia 11/23/2011  . RHEUMATOID ARTHRITIS 08/15/2008    Jeral Pinch PT 12/17/2015, 11:39 AM  [facility] [place], [place], [address] Phone: [phone]   Fax:  [phone]  Name: Rose Long MRN: [id number] Date of Birth: 1961-04-25

## 2015-12-19 ENCOUNTER — Ambulatory Visit (INDEPENDENT_AMBULATORY_CARE_PROVIDER_SITE_OTHER): Payer: Managed Care, Other (non HMO) | Admitting: Physical Therapy

## 2015-12-19 DIAGNOSIS — M25562 Pain in left knee: Secondary | ICD-10-CM

## 2015-12-19 DIAGNOSIS — M25662 Stiffness of left knee, not elsewhere classified: Secondary | ICD-10-CM

## 2015-12-19 DIAGNOSIS — R2240 Localized swelling, mass and lump, unspecified lower limb: Secondary | ICD-10-CM | POA: Diagnosis not present

## 2015-12-19 DIAGNOSIS — R531 Weakness: Secondary | ICD-10-CM

## 2015-12-19 NOTE — Therapy (Signed)
[place], [place], [address] Phone: [phone]   Fax:  [phone]  Physical Therapy Treatment  Patient Details  Name: Rose Long MRN: [id number] Date of Birth: Nov 16, 1960 Referring Provider: Dr Tori Milks  Encounter Date: 12/19/2015      PT End of Session - 12/19/15 1442    Visit Number 21   Number of Visits 24   Date for PT Re-Evaluation 12/26/15   PT Start Time [id number]   PT Stop Time 1459   PT Time Calculation (min) 64 min   Activity Tolerance Patient tolerated treatment well      Past Medical History  Diagnosis Date  . Arthritis   . Obesity   . Anemia   . Simple ovarian cyst     Right  . Complication of anesthesia     PONV  . Family history of adverse reaction to anesthesia     pt's mother stopped breathing possibly due to sleep apnea    Past Surgical History  Procedure Laterality Date  . 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 Nasal sinus surgery  1999  . Breast surgery  1998    reduction  . Uterine ablation  2012  . Under arm skin removed    . Breast surgery  10/2013    Lift  . Abdominoplasty/panniculectomy    . Total knee arthroplasty Left 09/30/2015    Procedure: TOTAL KNEE ARTHROPLASTY;  Surgeon: Melrose Nakayama, MD;  Location: [place];  Service: Orthopedics;  Laterality: Left;    There were no vitals filed for this visit.  Visit Diagnosis:  Stiffness of knee joint, left  Weakness generalized  Localized swelling of lower leg  Left knee pain      Subjective Assessment - 12/19/15 1443    Subjective Pt wasn't able to use heat or take medicine prior to treatment since she had to drive herself. Feels a little more stiff today.    Currently in Pain? Yes   Pain Score 5    Pain Location Knee   Pain Orientation Left   Pain Descriptors / Indicators Aching          [facility] Adult PT Treatment/Exercise - 12/19/15 0001    Knee/Hip Exercises: Stretches   Gastroc Stretch 2  reps;30 seconds;Both   Soleus Stretch Left;2 reps;20 seconds   Other Knee/Hip Stretches single and double knee to chest to    Knee/Hip Exercises: Aerobic   Stationary Bike L3: 5 min    Knee/Hip Exercises: Machines for Strengthening   Cybex Knee Extension 2 plates x 5 reps, 3 plates with BLE up and LLE down x 9 reps, 4 plates with BLE x 9    Total Gym Leg Press LLE 5 plates x 10 reps x 2 sets   Knee/Hip Exercises: Standing   Heel Raises Both;2 sets;10 reps   Lateral Step Up Left;1 set;10 reps  on Bosu    Forward Step Up 20 reps;Left  on Bosu   SLS Lt SLS to 30 sec; repeated on blue pad up to 24 sec, 2 reps ; Lt SLS with Rt toe taps front side back x 10 reps    Knee/Hip Exercises: Supine   Quad Sets AROM;Left;1 set;10 reps   Bridges Limitations 5 trials of single leg Lt - too difficult, moved to BLE x 5 reps    Modalities   Modalities Electrical Stimulation;Vasopneumatic   Moist Heat Therapy   Number Minutes Moist Heat 15 Minutes   Moist Heat  Location Ankle  Lt and calf   Electrical Stimulation   Electrical Stimulation Location Lt knee   Astronomer Parameters to tolerance    Electrical Stimulation Goals Edema;Pain   Vasopneumatic   Number Minutes Vasopneumatic  15 minutes   Vasopnuematic Location  Knee   Vasopneumatic Pressure High   Vasopneumatic Temperature  3 snowflakes    Manual Therapy   Kinesiotex Create Space;Edema  2 web shaped pieces on ant portion of Lt knee   Kinesiotix   Edema Rock tape applied in a web pattern to Lt knee            PT Long Term Goals - 12/17/15 1108    PT LONG TERM GOAL #1   Title Patient I in HEP 12/26/15   Status On-going   PT LONG TERM GOAL #2   Title increase Lt knee flexion =/> 110 degrees to assit with stair climbing 12/26/15   Status On-going   PT LONG TERM GOAL #3   Title increase Lt knee extension =/< -1 degrees 12/26/15   Status Achieved   PT LONG TERM GOAL #4   Title  ambulate without an assistive device and minimal gait deviations 12/26/15   Status Achieved   PT LONG TERM GOAL #5   Title Improve FOTO to </= 47% limitation 12/26/15   Status On-going  49% limited               Plan - 12/19/15 1442    Clinical Impression Statement Pt tolerated new exercises well with minimal increase in pain. SLS balance improved.  Rock tape continues to decrease pain and swelling.  Progressing towards established goals.    Pt will benefit from skilled therapeutic intervention in order to improve on the following deficits Abnormal gait;Decreased range of motion;Pain;Decreased scar mobility;Hypomobility;Decreased strength;Increased edema   Rehab Potential Excellent   PT Frequency 3x / week   PT Duration 4 weeks   PT Treatment/Interventions Ultrasound;Neuromuscular re-education;Scar mobilization;Biofeedback;Gait training;Patient/family education;Passive range of motion;Dry needling;Cryotherapy;Electrical Stimulation;Moist Heat;Therapeutic exercise;Manual techniques;Vasopneumatic Device   PT Next Visit Plan Continue progressive ROM and strengthening of Lt knee. cont  rock tape as indicated.    Consulted and Agree with Plan of Care Patient        Problem List Patient Active Problem List   Diagnosis Date Noted  . History of left knee replacement 11/25/2015  . Primary osteoarthritis of left knee 09/30/2015  . Primary osteoarthritis of knee 09/30/2015  . Primary osteoarthritis of left wrist 07/28/2015  . Olecranon bursitis of right elbow 06/16/2015  . Hemorrhoid 06/12/2014  . Absolute anemia 06/12/2014  . Abnormal facial hair 08/03/2013  . Trochanteric bursitis of both hips 06/12/2013  . Low back pain 05/21/2013  . Osteoarthritis of both knees 05/04/2013  . Metatarsalgia of left foot 05/04/2013  . Obesity 02/22/2012  . Migraine without aura 11/23/2011  . Chronic daily headache 11/23/2011  . Muscle spasm 11/23/2011  . Insomnia 11/23/2011  . RHEUMATOID  ARTHRITIS 08/15/2008    Kerin Perna, PTA 12/19/2015 2:45 PM  [place], [place], [address] Phone: [phone]   Fax:  [phone]  Name: Rose Long MRN: [id number] Date of Birth: [birth year]

## 2015-12-22 ENCOUNTER — Ambulatory Visit (INDEPENDENT_AMBULATORY_CARE_PROVIDER_SITE_OTHER): Payer: Managed Care, Other (non HMO) | Admitting: Physical Therapy

## 2015-12-22 DIAGNOSIS — M25562 Pain in left knee: Secondary | ICD-10-CM

## 2015-12-22 DIAGNOSIS — R531 Weakness: Secondary | ICD-10-CM | POA: Diagnosis not present

## 2015-12-22 DIAGNOSIS — R2240 Localized swelling, mass and lump, unspecified lower limb: Secondary | ICD-10-CM

## 2015-12-22 DIAGNOSIS — M25662 Stiffness of left knee, not elsewhere classified: Secondary | ICD-10-CM | POA: Diagnosis not present

## 2015-12-22 NOTE — Therapy (Signed)
[place], [place], [address] Phone: [phone]   Fax:  [phone]  Physical Therapy Treatment  Patient Details  Name: Rose Long MRN: [id number] Date of Birth: 01-30-1961 Referring Provider: Dr Tori Milks  Encounter Date: 12/22/2015      PT End of Session - 12/22/15 1432    Visit Number 22   Number of Visits 24   Date for PT Re-Evaluation 12/26/15   PT Start Time [id number]   PT Stop Time 1535   PT Time Calculation (min) 61 min   Activity Tolerance Patient tolerated treatment well      Past Medical History  Diagnosis Date  . Arthritis   . Obesity   . Anemia   . Simple ovarian cyst     Right  . Complication of anesthesia     PONV  . Family history of adverse reaction to anesthesia     pt's mother stopped breathing possibly due to sleep apnea    Past Surgical History  Procedure Laterality Date  . 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 Nasal sinus surgery  1999  . Breast surgery  1998    reduction  . Uterine ablation  2012  . Under arm skin removed    . Breast surgery  10/2013    Lift  . Abdominoplasty/panniculectomy    . Total knee arthroplasty Left 09/30/2015    Procedure: TOTAL KNEE ARTHROPLASTY;  Surgeon: Melrose Nakayama, MD;  Location: [facility];  Service: Orthopedics;  Laterality: Left;    There were no vitals filed for this visit.  Visit Diagnosis:  Stiffness of knee joint, left  Weakness generalized  Localized swelling of lower leg  Left knee pain      Subjective Assessment - 12/22/15 1449    Subjective Pt reports Lt ankle feels pretty good today.  Pt reports she feels restless at night.  She is experiencing more Lt hip and LBP than before.  Pt reports MD was happy with her progress; returns to MD in 3 months. She had xray of knee as well.    Currently in Pain? Yes  took medicine prior to treatment.    Pain Score 4    Pain Location Knee   Pain Orientation Left   Pain  Descriptors / Indicators Aching   Aggravating Factors  prolonged sitting    Pain Relieving Factors elevation, medication, ice.             [facility] PT Assessment - 12/22/15 0001    Assessment   Medical Diagnosis Lt TKA    Onset Date/Surgical Date 09/29/16   Hand Dominance Left   Next MD Visit 3 months.    AROM   Right/Left Knee Left   Left Knee Flexion 100  seated scoot            [facility] Adult PT Treatment/Exercise - 12/22/15 0001    Knee/Hip Exercises: Stretches   Passive Hamstring Stretch Left;3 reps;30 seconds   Quad Stretch Left;3 reps  prone with strap   Gastroc Stretch 2 reps;30 seconds;Both   Soleus Stretch Left;2 reps;20 seconds   Other Knee/Hip Stretches Lt ITB stretch / Lt adductor stretch supine with strap x 30 sec x 2 reps    Knee/Hip Exercises: Aerobic   Stationary Bike L4: 6.5 min    Knee/Hip Exercises: Standing   Terminal Knee Extension Limitations Lt TKE with orange ball behind knee x 10    Lateral Step Up Left;1 set;10 reps  on  Bosu    Forward Step Up 20 reps;Left  on Bosu   SLS Lt SLS on Bosu with occasional  UE support x 3 trials x up to 30sec;  Lt SLS on mini Tramp x 30 sec; repeated with Rt toe taps front/side/ back x 10 reps    Other Standing Knee Exercises Heel raises on mini Tramp x 20   Knee/Hip Exercises: Seated   Other Seated Knee/Hip Exercises seated scoots in chair to increase Lt knee ROM x 8 reps  (up to 100 deg) with sit to stand between reps    Modalities   Modalities Electrical Stimulation;Moist Heat;Vasopneumatic   Moist Heat Therapy   Number Minutes Moist Heat 15 Minutes   Moist Heat Location Ankle  Lt and calf   Electrical Stimulation   Electrical Stimulation Location Lt knee    Electrical Stimulation Action ion repelling    Electrical Stimulation Parameters to tolerance    Electrical Stimulation Goals Edema;Pain   Vasopneumatic   Number Minutes Vasopneumatic  15 minutes   Vasopnuematic Location  Knee   Vasopneumatic Pressure  High   Vasopneumatic Temperature  3 snowflakes                      PT Long Term Goals - 12/17/15 1108    PT LONG TERM GOAL #1   Title Patient I in HEP 12/26/15   Status On-going   PT LONG TERM GOAL #2   Title increase Lt knee flexion =/> 110 degrees to assit with stair climbing 12/26/15   Status On-going   PT LONG TERM GOAL #3   Title increase Lt knee extension =/< -1 degrees 12/26/15   Status Achieved   PT LONG TERM GOAL #4   Title ambulate without an assistive device and minimal gait deviations 12/26/15   Status Achieved   PT LONG TERM GOAL #5   Title Improve FOTO to </= 47% limitation 12/26/15   Status On-going  49% limited               Plan - 12/22/15 1705    Clinical Impression Statement Pt tolerated new exercises well, mild increase in pain.  Pt continues to get relief and decreased swelling from use of Rock tape, vaso and estim.  Progressing towards established goals.    Pt will benefit from skilled therapeutic intervention in order to improve on the following deficits Abnormal gait;Decreased range of motion;Pain;Decreased scar mobility;Hypomobility;Decreased strength;Increased edema   Rehab Potential Excellent   PT Frequency 3x / week   PT Duration 4 weeks   PT Treatment/Interventions Ultrasound;Neuromuscular re-education;Scar mobilization;Biofeedback;Gait training;Patient/family education;Passive range of motion;Dry needling;Cryotherapy;Electrical Stimulation;Moist Heat;Therapeutic exercise;Manual techniques;Vasopneumatic Device   PT Next Visit Plan assess goals and need for continuation of therapy vs d/c to HEP.    Consulted and Agree with Plan of Care Patient        Problem List Patient Active Problem List   Diagnosis Date Noted  . History of left knee replacement 11/25/2015  . Primary osteoarthritis of left knee 09/30/2015  . Primary osteoarthritis of knee 09/30/2015  . Primary osteoarthritis of left wrist 07/28/2015  . Olecranon bursitis of  right elbow 06/16/2015  . Hemorrhoid 06/12/2014  . Absolute anemia 06/12/2014  . Abnormal facial hair 08/03/2013  . Trochanteric bursitis of both hips 06/12/2013  . Low back pain 05/21/2013  . Osteoarthritis of both knees 05/04/2013  . Metatarsalgia of left foot 05/04/2013  . Obesity 02/22/2012  . Migraine without aura 11/23/2011  .  Chronic daily headache 11/23/2011  . Muscle spasm 11/23/2011  . Insomnia 11/23/2011  . RHEUMATOID ARTHRITIS 08/15/2008    Kerin Perna, PTA 12/22/2015 5:07 PM  [place], [place], [address] Phone: [phone]   Fax:  [phone]  Name: MEEYA PACCIONE MRN: [id number] Date of Birth: 1961/04/16

## 2015-12-24 ENCOUNTER — Ambulatory Visit (INDEPENDENT_AMBULATORY_CARE_PROVIDER_SITE_OTHER): Payer: Managed Care, Other (non HMO) | Admitting: Physical Therapy

## 2015-12-24 ENCOUNTER — Encounter: Payer: Self-pay | Admitting: Physical Therapy

## 2015-12-24 DIAGNOSIS — M25562 Pain in left knee: Secondary | ICD-10-CM | POA: Diagnosis not present

## 2015-12-24 DIAGNOSIS — R2240 Localized swelling, mass and lump, unspecified lower limb: Secondary | ICD-10-CM

## 2015-12-24 DIAGNOSIS — R531 Weakness: Secondary | ICD-10-CM

## 2015-12-24 DIAGNOSIS — M25662 Stiffness of left knee, not elsewhere classified: Secondary | ICD-10-CM

## 2015-12-24 NOTE — Therapy (Signed)
[place], [place], [address] Phone: [phone]   Fax:  [phone]  Physical Therapy Treatment  Patient Details  Name: Rose Long MRN: [id number] Date of Birth: November 22, 1960 Referring Provider: Dr Tori Milks  Encounter Date: 12/24/2015      PT End of Session - 12/24/15 1431    Visit Number 23   Number of Visits 24   Date for PT Re-Evaluation 12/26/15   PT Start Time [id number]   PT Stop Time (p) 1539   PT Time Calculation (min) (p) 67 min      Past Medical History  Diagnosis Date  . Arthritis   . Obesity   . Anemia   . Simple ovarian cyst     Right  . Complication of anesthesia     PONV  . Family history of adverse reaction to anesthesia     pt's mother stopped breathing possibly due to sleep apnea    Past Surgical History  Procedure Laterality Date  . 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 Nasal sinus surgery  1999  . Breast surgery  1998    reduction  . Uterine ablation  2012  . Under arm skin removed    . Breast surgery  10/2013    Lift  . Abdominoplasty/panniculectomy    . Total knee arthroplasty Left 09/30/2015    Procedure: TOTAL KNEE ARTHROPLASTY;  Surgeon: Melrose Nakayama, MD;  Location: [facility];  Service: Orthopedics;  Laterality: Left;    There were no vitals filed for this visit.  Visit Diagnosis:  Stiffness of knee joint, left  Weakness generalized  Localized swelling of lower leg  Left knee pain      Subjective Assessment - 12/24/15 1433    Subjective Pt states she is doing pretty good today. Lt knee feels different. Has friends coming down for the weekend, going to [place].    Currently in Pain? Yes   Pain Score 4    Pain Location Knee   Pain Orientation Left   Pain Descriptors / Indicators Dull   Pain Type Surgical pain   Pain Onset More than a month ago   Pain Frequency Constant                         [facility] Adult PT Treatment/Exercise -  12/24/15 0001    Knee/Hip Exercises: Aerobic   Stationary Bike L4: 6 min    Knee/Hip Exercises: Standing   Knee Flexion Strengthening;Left;20 reps  keeping knees even   Functional Squat 15 reps  on upside down bosu   Other Standing Knee Exercises on upside down bosu, head turns side/up/down   Knee/Hip Exercises: Seated   Heel Slides AROM;10 reps  under chair   Modalities   Modalities Electrical Stimulation;Moist Heat;Vasopneumatic   Moist Heat Therapy   Number Minutes Moist Heat 15 Minutes   Moist Heat Location Ankle   Electrical Stimulation   Electrical Stimulation Location Lt knee    Electrical Stimulation Action ion repelling   Electrical Stimulation Parameters to tolerance   Electrical Stimulation Goals Edema;Pain   Vasopneumatic   Number Minutes Vasopneumatic  15 minutes   Vasopnuematic Location  Knee   Vasopneumatic Pressure High   Vasopneumatic Temperature  3 snowflakes    Manual Therapy   Manual Therapy Joint mobilization   Joint Mobilization posterior talus mobs - pt very tender on lateral tibial plataue , flexion up to 98 degrees today  in supine  and sitting  grade III                     PT Long Term Goals - 12/17/15 1108    PT LONG TERM GOAL #1   Title Patient I in HEP 12/26/15   Status On-going   PT LONG TERM GOAL #2   Title increase Lt knee flexion =/> 110 degrees to assit with stair climbing 12/26/15   Status On-going   PT LONG TERM GOAL #3   Title increase Lt knee extension =/< -1 degrees 12/26/15   Status Achieved   PT LONG TERM GOAL #4   Title ambulate without an assistive device and minimal gait deviations 12/26/15   Status Achieved   PT LONG TERM GOAL #5   Title Improve FOTO to </= 47% limitation 12/26/15   Status On-going  49% limited               Plan - 12/24/15 1615    Clinical Impression Statement Pt is having difficulty picking up more Lt knee flexion, she has stayed 90-100 degrees for the last week.  Her strength and  balance are improving as is her gait.    Pt will benefit from skilled therapeutic intervention in order to improve on the following deficits Abnormal gait;Decreased range of motion;Pain;Decreased scar mobility;Hypomobility;Decreased strength;Increased edema   Rehab Potential Good   PT Frequency 3x / week   PT Duration 4 weeks   PT Treatment/Interventions Ultrasound;Neuromuscular re-education;Scar mobilization;Biofeedback;Gait training;Patient/family education;Passive range of motion;Dry needling;Cryotherapy;Electrical Stimulation;Moist Heat;Therapeutic exercise;Manual techniques;Vasopneumatic Device   PT Next Visit Plan assess goals and need for continuation of therapy vs d/c to HEP.         Problem List Patient Active Problem List   Diagnosis Date Noted  . History of left knee replacement 11/25/2015  . Primary osteoarthritis of left knee 09/30/2015  . Primary osteoarthritis of knee 09/30/2015  . Primary osteoarthritis of left wrist 07/28/2015  . Olecranon bursitis of right elbow 06/16/2015  . Hemorrhoid 06/12/2014  . Absolute anemia 06/12/2014  . Abnormal facial hair 08/03/2013  . Trochanteric bursitis of both hips 06/12/2013  . Low back pain 05/21/2013  . Osteoarthritis of both knees 05/04/2013  . Metatarsalgia of left foot 05/04/2013  . Obesity 02/22/2012  . Migraine without aura 11/23/2011  . Chronic daily headache 11/23/2011  . Muscle spasm 11/23/2011  . Insomnia 11/23/2011  . RHEUMATOID ARTHRITIS 08/15/2008    Jeral Pinch PT 12/24/2015, 4:18 PM  [facility] [place], [place], [address] Phone: [phone]   Fax:  [phone]  Name: SAADIYA SECUNDINO MRN: [id number] Date of Birth: [birth year]

## 2015-12-26 ENCOUNTER — Ambulatory Visit (INDEPENDENT_AMBULATORY_CARE_PROVIDER_SITE_OTHER): Payer: Managed Care, Other (non HMO) | Admitting: Physical Therapy

## 2015-12-26 DIAGNOSIS — R269 Unspecified abnormalities of gait and mobility: Secondary | ICD-10-CM

## 2015-12-26 DIAGNOSIS — M25562 Pain in left knee: Secondary | ICD-10-CM | POA: Diagnosis not present

## 2015-12-26 DIAGNOSIS — M25662 Stiffness of left knee, not elsewhere classified: Secondary | ICD-10-CM

## 2015-12-26 DIAGNOSIS — R531 Weakness: Secondary | ICD-10-CM | POA: Diagnosis not present

## 2015-12-26 DIAGNOSIS — R2240 Localized swelling, mass and lump, unspecified lower limb: Secondary | ICD-10-CM | POA: Diagnosis not present

## 2015-12-26 NOTE — Therapy (Addendum)
[facility] [address] [place], [place], [address] Phone: [phone]   Fax:  [phone]  Physical Therapy Treatment  Patient Details  Name: Rose Long MRN: [id number] Date of Birth: 11/11/1960 Referring Provider: Dr. Peter Dalldorff  Encounter Date: 12/26/2015      PT End of Session - 12/26/15 1402    Visit Number 24   Number of Visits 24   Date for PT Re-Evaluation 12/26/15   PT Start Time 1358   PT Stop Time 1506   PT Time Calculation (min) 68 min   Activity Tolerance Patient tolerated treatment well      Past Medical History  Diagnosis Date  . Arthritis   . Obesity   . Anemia   . Simple ovarian cyst     Right  . Complication of anesthesia     PONV  . Family history of adverse reaction to anesthesia     pt's mother stopped breathing possibly due to sleep apnea    Past Surgical History  Procedure Laterality Date  . 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 Nasal sinus surgery  1999  . Breast surgery  1998    reduction  . Uterine ablation  2012  . Under arm skin removed    . Breast surgery  10/2013    Lift  . Abdominoplasty/panniculectomy    . Total knee arthroplasty Left 09/30/2015    Procedure: TOTAL KNEE ARTHROPLASTY;  Surgeon: Peter Dalldorf, MD;  Location: [facility];  Service: Orthopedics;  Laterality: Left;    There were no vitals filed for this visit.  Visit Diagnosis:  Stiffness of knee joint, left  Weakness generalized  Left knee pain  Localized swelling of lower leg  Abnormality of gait      Subjective Assessment - 12/26/15 1402    Subjective Pt reports she walked a little more than usual. Her Lt knee "feels a little more swollen and tight" today. Pt is no long waking up in night from pain.  Pt is interested in continuation of therapy to increase ROM, decrease pain, and improve mobility.    Currently in Pain? Yes   Pain Score 5   pain medicine 30 min prior to treatment   Pain  Location Knee   Pain Orientation Left            [facility] PT Assessment - 12/26/15 0001    Assessment   Medical Diagnosis Lt TKA    Referring Provider Dr. Peter Dalldorff   Onset Date/Surgical Date 09/29/16   Hand Dominance Left   Next MD Visit 3 months.    AROM   AROM Assessment Site Knee   Right/Left Knee Left   Left Knee Flexion 100  seated scoot          [facility] Adult PT Treatment/Exercise - 12/26/15 0001    Knee/Hip Exercises: Stretches   Passive Hamstring Stretch Left;3 reps;30 seconds   Quad Stretch Left;5 reps  prone with strap   Gastroc Stretch 2 reps;30 seconds;Both   Soleus Stretch Left;2 reps;20 seconds   Other Knee/Hip Stretches Lt ITB stretch / Lt adductor stretch supine with strap x 30 sec x 2 reps    Knee/Hip Exercises: Aerobic   Stationary Bike L4: 6 min    Elliptical L2: 3 min    Knee/Hip Exercises: Machines for Strengthening   Total Gym Leg Press LLE 5 plates x 10 reps, repeated with 6 plates x 2 sets. (performed on RLE alone when LLE resting between sets.)     Knee/Hip Exercises: Standing   Forward Lunges Right;Left;10 reps;2 sets   Side Lunges Both;1 set;10 reps   Functional Squat 15 reps  on upside down bosu   SLS Lt SLS on Bosu x 20 sec x 3 trial   Other Standing Knee Exercises modified downward dog (hands on black low mat) x 20 sec x 3 reps    Knee/Hip Exercises: Seated   Other Seated Knee/Hip Exercises seated scoots in chair to increase Lt knee ROM x 5 reps  (up to 100 deg) with sit to stand between reps    Modalities   Modalities Electrical Stimulation;Vasopneumatic   Moist Heat Therapy   Number Minutes Moist Heat 15 Minutes   Moist Heat Location Ankle   Electrical Stimulation   Electrical Stimulation Location Lt knee    Electrical Stimulation Action ion repelling   Electrical Stimulation Parameters to tolerance    Electrical Stimulation Goals Edema;Pain   Vasopneumatic   Number Minutes Vasopneumatic  15 minutes   Vasopnuematic Location   Knee   Vasopneumatic Pressure Medium   Vasopneumatic Temperature  3 snowflakes             PT Long Term Goals - 12/26/15 1419    PT LONG TERM GOAL #1   Title Patient I in HEP 12/26/15   Period Weeks   Status On-going   PT LONG TERM GOAL #2   Title increase Lt knee flexion =/> 110 degrees to assit with stair climbing 12/26/15   Time 4   Period Weeks   Status On-going   PT LONG TERM GOAL #3   Title increase Lt knee extension =/< -1 degrees 12/26/15   Time 4   Period Weeks   Status Achieved   PT LONG TERM GOAL #4   Title ambulate without an assistive device and minimal gait deviations 12/26/15   Time 4   Period Weeks   Status Achieved   PT LONG TERM GOAL #5   Title Improve FOTO to </= 47% limitation 12/26/15   Time 4   Period Weeks   Status On-going   PT LONG TERM GOAL #6   Title report pain decrease =/< 2/10 after a work day 12/26/15   Time 4   Period Weeks   Status On-going  Pain usually 4-5/10 after work    PT LONG TERM GOAL #7   Title increase strength bilat LEs's =/< 4+/5 2/14/174   Time 4   Period Weeks   Status Achieved               Plan - 12/26/15 1503    Clinical Impression Statement Pt tolerated new exercises well, with increased resistance for leg press.  Pt's Lt knee ROM remain the same as last visit.  Pt has partially met her goals and is interested in continuation of therapy to maximize functional mobility independence.     Pt will benefit from skilled therapeutic intervention in order to improve on the following deficits Abnormal gait;Decreased range of motion;Pain;Decreased scar mobility;Hypomobility;Decreased strength;Increased edema   Rehab Potential Good   PT Frequency 3x / week   PT Duration 4 weeks   PT Treatment/Interventions Ultrasound;Neuromuscular re-education;Scar mobilization;Biofeedback;Gait training;Patient/family education;Passive range of motion;Dry needling;Cryotherapy;Electrical Stimulation;Moist Heat;Therapeutic exercise;Manual  techniques;Vasopneumatic Device   PT Next Visit Plan Spoke to supervising PT regarding continuation of therapy to meet stated goals. Will continue progressive ROM and strengthening for Lt knee.    PT Home Exercise Plan Pt to begin work outs at gym. Continue current HEP.      Consulted and Agree with Plan of Care Patient        Problem List Patient Active Problem List   Diagnosis Date Noted  . History of left knee replacement 11/25/2015  . Primary osteoarthritis of left knee 09/30/2015  . Primary osteoarthritis of knee 09/30/2015  . Primary osteoarthritis of left wrist 07/28/2015  . Olecranon bursitis of right elbow 06/16/2015  . Hemorrhoid 06/12/2014  . Absolute anemia 06/12/2014  . Abnormal facial hair 08/03/2013  . Trochanteric bursitis of both hips 06/12/2013  . Low back pain 05/21/2013  . Osteoarthritis of both knees 05/04/2013  . Metatarsalgia of left foot 05/04/2013  . Obesity 02/22/2012  . Migraine without aura 11/23/2011  . Chronic daily headache 11/23/2011  . Muscle spasm 11/23/2011  . Insomnia 11/23/2011  . RHEUMATOID ARTHRITIS 08/15/2008    Kerin Perna, PTA 12/26/2015 3:12 PM  [facility] [place], [place], [address] Phone: [phone]   Fax:  [phone]  Name: BRIUNNA LEICHT MRN: [id number] Date of Birth: 24-Jun-1961

## 2015-12-31 ENCOUNTER — Ambulatory Visit (INDEPENDENT_AMBULATORY_CARE_PROVIDER_SITE_OTHER): Payer: Managed Care, Other (non HMO) | Admitting: Physical Therapy

## 2015-12-31 DIAGNOSIS — R269 Unspecified abnormalities of gait and mobility: Secondary | ICD-10-CM

## 2015-12-31 DIAGNOSIS — M25562 Pain in left knee: Secondary | ICD-10-CM | POA: Diagnosis not present

## 2015-12-31 DIAGNOSIS — M25662 Stiffness of left knee, not elsewhere classified: Secondary | ICD-10-CM | POA: Diagnosis not present

## 2015-12-31 DIAGNOSIS — R531 Weakness: Secondary | ICD-10-CM

## 2015-12-31 DIAGNOSIS — R2240 Localized swelling, mass and lump, unspecified lower limb: Secondary | ICD-10-CM

## 2015-12-31 NOTE — Therapy (Signed)
[place], [place], [address] Phone: [phone]   Fax:  [phone]  Physical Therapy Treatment  Patient Details  Name: Rose Long MRN: [id number] Date of Birth: [birth year] Referring Provider: Dr Bobbye Morton  Encounter Date: 12/31/2015      PT End of Session - 12/31/15 1424    Visit Number 25   Number of Visits 31   Date for PT Re-Evaluation 01/28/16   PT Start Time [id number]   PT Stop Time 1529   PT Time Calculation (min) 65 min   Activity Tolerance Patient tolerated treatment well      Past Medical History  Diagnosis Date  . Arthritis   . Obesity   . Anemia   . Simple ovarian cyst     Right  . Complication of anesthesia     PONV  . Family history of adverse reaction to anesthesia     pt's mother stopped breathing possibly due to sleep apnea    Past Surgical History  Procedure Laterality Date  . 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 Nasal sinus surgery  1999  . Breast surgery  1998    reduction  . Uterine ablation  2012  . Under arm skin removed    . Breast surgery  10/2013    Lift  . Abdominoplasty/panniculectomy    . Total knee arthroplasty Left 09/30/2015    Procedure: TOTAL KNEE ARTHROPLASTY;  Surgeon: Melrose Nakayama, MD;  Location: [facility];  Service: Orthopedics;  Laterality: Left;    There were no vitals filed for this visit.  Visit Diagnosis:  Stiffness of knee joint, left - Plan: PT plan of care cert/re-cert  Weakness generalized - Plan: PT plan of care cert/re-cert  Left knee pain - Plan: PT plan of care cert/re-cert  Localized swelling of lower leg - Plan: PT plan of care cert/re-cert  Abnormality of gait - Plan: PT plan of care cert/re-cert      Subjective Assessment - 12/31/15 1425    Subjective Pt reports that she swelled up since Saturday when being out of town and doing a lot of walking.  It has been coming down slowly.    Patient Stated Goals get motion back so  no need for manipulation, exercise again and decrease pain.    Currently in Pain? Yes   Pain Score 3    Pain Location Knee   Pain Orientation Left   Pain Descriptors / Indicators Pressure   Pain Type Surgical pain   Pain Onset More than a month ago   Aggravating Factors  prolonged standing    Pain Relieving Factors moving around loosens up the knee.             [facility] PT Assessment - 12/31/15 0001    Assessment   Medical Diagnosis Lt TKA    Referring Provider Dr Bobbye Morton   Onset Date/Surgical Date 09/29/16   Hand Dominance Left   Next MD Visit 3 months.    Observation/Other Assessments   Focus on Therapeutic Outcomes (FOTO)  49% limited   AROM   AROM Assessment Site Knee   Right/Left Knee Left   Left Knee Extension -1   Left Knee Flexion 100  in a chair                     [facility] Adult PT Treatment/Exercise - 12/31/15 0001    Knee/Hip Exercises: Stretches   Quad Stretch Left;3 reps;30 seconds  prone with strap  Other Knee/Hip Stretches knee flex stretches with placing Lt foot on 17" step   Other Knee/Hip Stretches self knee ext stretches with leg straight.    Knee/Hip Exercises: Aerobic   Stationary Bike L4: 6 min   increased to L5 for last 2' min   Knee/Hip Exercises: Machines for Strengthening   Cybex Knee Extension 3x10 3 plates   Total Gym Leg Press Lt LE 6 plates 2x10, then 10 reps 5 plates.    Knee/Hip Exercises: Supine   Bridges Limitations 10 reps bilat LE   Single Leg Bridge Strengthening;Left;10 reps   Knee/Hip Exercises: Prone   Hamstring Curl 3 sets;10 reps  5#   Modalities   Modalities Electrical Stimulation;Moist Heat;Vasopneumatic   Moist Heat Therapy   Number Minutes Moist Heat 15 Minutes   Moist Heat Location Ankle   Electrical Stimulation   Electrical Stimulation Location Lt knee    Electrical Stimulation Action ion repelling    Electrical Stimulation Parameters tto tolerance   Electrical Stimulation Goals Edema;Pain    Vasopneumatic   Number Minutes Vasopneumatic  15 minutes   Vasopnuematic Location  Knee   Vasopneumatic Pressure Medium   Vasopneumatic Temperature  3 snowflakes    Manual Therapy   Manual Therapy Soft tissue mobilization;Joint mobilization   Joint Mobilization Lt knee ext   Soft tissue mobilization Lt HS and calf                     PT Long Term Goals - 12/31/15 1458    PT LONG TERM GOAL #1   Title Patient I in HEP 01/27/16   Status On-going   PT LONG TERM GOAL #2   Title increase Lt knee flexion =/> 110 degrees to assit with stair climbing 01/28/16   Status On-going   PT LONG TERM GOAL #3   Title increase Lt knee extension =/< -1 degrees 12/26/15   Status Achieved   PT LONG TERM GOAL #4   Title ambulate without an assistive device and minimal gait deviations 12/26/15   Status Achieved   PT LONG TERM GOAL #5   Title Improve FOTO to </= 47% limitation 01/28/16   Status On-going   PT LONG TERM GOAL #6   Title report pain decrease =/< 2/10 after a work day 01/28/16   Status On-going   PT LONG TERM GOAL #7   Title increase strength bilat LEs's =/< 4+/5 2/14/174   Status Achieved               Plan - 12/31/15 1509    Clinical Impression Statement Rose Long continues to make slow progress, did have stabilzation this week as she had increased edema after increasing her activity over the weekend.  She would benefit from continued treatment with decreasing her frequency.    Pt will benefit from skilled therapeutic intervention in order to improve on the following deficits Abnormal gait;Decreased range of motion;Pain;Decreased scar mobility;Hypomobility;Decreased strength;Increased edema   Rehab Potential Good   PT Frequency 2x / week   PT Duration 2 weeks  then 1x/wk for 2 weeks.    PT Treatment/Interventions Ultrasound;Neuromuscular re-education;Scar mobilization;Biofeedback;Gait training;Patient/family education;Passive range of motion;Dry  needling;Cryotherapy;Electrical Stimulation;Moist Heat;Therapeutic exercise;Manual techniques;Vasopneumatic Device   PT Next Visit Plan renewal written to continue 2x/wk for 2wks then 1x/wk for 2 wks.         Problem List Patient Active Problem List   Diagnosis Date Noted  . History of left knee replacement 11/25/2015  . Primary osteoarthritis of left knee  09/30/2015  . Primary osteoarthritis of knee 09/30/2015  . Primary osteoarthritis of left wrist 07/28/2015  . Olecranon bursitis of right elbow 06/16/2015  . Hemorrhoid 06/12/2014  . Absolute anemia 06/12/2014  . Abnormal facial hair 08/03/2013  . Trochanteric bursitis of both hips 06/12/2013  . Low back pain 05/21/2013  . Osteoarthritis of both knees 05/04/2013  . Metatarsalgia of left foot 05/04/2013  . Obesity 02/22/2012  . Migraine without aura 11/23/2011  . Chronic daily headache 11/23/2011  . Muscle spasm 11/23/2011  . Insomnia 11/23/2011  . RHEUMATOID ARTHRITIS 08/15/2008    Jeral Pinch PT 12/31/2015, 3:16 PM  [facility] [place], [place], [address] Phone: [phone]   Fax:  [phone]  Name: Rose Long MRN: [id number] Date of Birth: 10-04-1961

## 2016-01-02 ENCOUNTER — Encounter: Payer: Self-pay | Admitting: Physical Therapy

## 2016-01-02 ENCOUNTER — Ambulatory Visit (INDEPENDENT_AMBULATORY_CARE_PROVIDER_SITE_OTHER): Payer: Managed Care, Other (non HMO) | Admitting: Physical Therapy

## 2016-01-02 DIAGNOSIS — M25662 Stiffness of left knee, not elsewhere classified: Secondary | ICD-10-CM | POA: Diagnosis not present

## 2016-01-02 DIAGNOSIS — R531 Weakness: Secondary | ICD-10-CM | POA: Diagnosis not present

## 2016-01-02 DIAGNOSIS — R2240 Localized swelling, mass and lump, unspecified lower limb: Secondary | ICD-10-CM | POA: Diagnosis not present

## 2016-01-02 DIAGNOSIS — M25562 Pain in left knee: Secondary | ICD-10-CM | POA: Diagnosis not present

## 2016-01-02 NOTE — Therapy (Signed)
[place], [place], [address] Phone: [phone]   Fax:  [phone]  Physical Therapy Treatment  Patient Details  Name: Rose Long MRN: [id number] Date of Birth: 1960-11-15 Referring Provider: Dr Bobbye Morton  Encounter Date: 01/02/2016      PT End of Session - 01/02/16 1341    Visit Number 26   Number of Visits 31   Date for PT Re-Evaluation 01/28/16   PT Start Time [id number]   PT Stop Time 1432   PT Time Calculation (min) 58 min   Activity Tolerance Patient tolerated treatment well      Past Medical History  Diagnosis Date  . Arthritis   . Obesity   . Anemia   . Simple ovarian cyst     Right  . Complication of anesthesia     PONV  . Family history of adverse reaction to anesthesia     pt's mother stopped breathing possibly due to sleep apnea    Past Surgical History  Procedure Laterality Date  . 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 Nasal sinus surgery  1999  . Breast surgery  1998    reduction  . Uterine ablation  2012  . Under arm skin removed    . Breast surgery  10/2013    Lift  . Abdominoplasty/panniculectomy    . Total knee arthroplasty Left 09/30/2015    Procedure: TOTAL KNEE ARTHROPLASTY;  Surgeon: Melrose Nakayama, MD;  Location: [facility];  Service: Orthopedics;  Laterality: Left;    There were no vitals filed for this visit.  Visit Diagnosis:  Stiffness of knee joint, left  Weakness generalized  Left knee pain  Localized swelling of lower leg      Subjective Assessment - 01/02/16 1341    Subjective Pt reports she was very sore and swollen after the treatment, used ice as able. She was also on her leg a lot yesterday.    Currently in Pain? Yes   Pain Score 3    Pain Location Knee   Pain Orientation Left   Pain Descriptors / Indicators Aching   Pain Type Surgical pain                         [facility] Adult PT Treatment/Exercise - 01/02/16 0001     Knee/Hip Exercises: Stretches   Passive Hamstring Stretch Left;2 reps;30 seconds  with foot up on trailer ledge   Gastroc Stretch 30 seconds   Soleus Stretch 30 seconds   Knee/Hip Exercises: Aerobic   Stationary Bike L4: 6 min    Knee/Hip Exercises: Standing   Forward Step Up Left;2 sets;10 reps  13" step   Functional Squat 3 sets;10 reps  single leg Lt lowering, bilat up   Other Standing Knee Exercises standing fitter, one blue, one black band 3x10   Knee/Hip Exercises: Sidelying   Other Sidelying Knee/Hip Exercises 2x10 toe taps FWD/BWD with hip abduction  arcs   Modalities   Modalities Electrical Stimulation;Moist Heat;Vasopneumatic   Moist Heat Therapy   Number Minutes Moist Heat 15 Minutes   Moist Heat Location Ankle   Electrical Stimulation   Electrical Stimulation Location Lt knee    Electrical Stimulation Action ion repelling   Electrical Stimulation Parameters to tolerance   Electrical Stimulation Goals Edema;Pain   Vasopneumatic   Number Minutes Vasopneumatic  15 minutes   Vasopnuematic Location  Knee   Vasopneumatic Pressure Medium   Vasopneumatic Temperature  3 snowflakes  PT Long Term Goals - 12/31/15 1458    PT LONG TERM GOAL #1   Title Patient I in HEP 01/27/16   Status On-going   PT LONG TERM GOAL #2   Title increase Lt knee flexion =/> 110 degrees to assit with stair climbing 01/28/16   Status On-going   PT LONG TERM GOAL #3   Title increase Lt knee extension =/< -1 degrees 12/26/15   Status Achieved   PT LONG TERM GOAL #4   Title ambulate without an assistive device and minimal gait deviations 12/26/15   Status Achieved   PT LONG TERM GOAL #5   Title Improve FOTO to </= 47% limitation 01/28/16   Status On-going   PT LONG TERM GOAL #6   Title report pain decrease =/< 2/10 after a work day 01/28/16   Status On-going   PT LONG TERM GOAL #7   Title increase strength bilat LEs's =/< 4+/5 2/14/174   Status Achieved                Plan - 01/02/16 1419    Clinical Impression Statement Rose Long continues to tolerate being pushed in therapy.  Performing harder exercise with more ease.  Lt LE has high level functional weakness especially when in SLS activities.  Edema in her knee has occurred with increased activites. This settles down faster than before as would be expected.    Pt will benefit from skilled therapeutic intervention in order to improve on the following deficits Abnormal gait;Decreased range of motion;Pain;Decreased scar mobility;Hypomobility;Decreased strength;Increased edema   Rehab Potential Good   PT Frequency 2x / week   PT Duration 2 weeks  teh 1x/wk for 2wk   PT Treatment/Interventions Ultrasound;Neuromuscular re-education;Scar mobilization;Biofeedback;Gait training;Patient/family education;Passive range of motion;Dry needling;Cryotherapy;Electrical Stimulation;Moist Heat;Therapeutic exercise;Manual techniques;Vasopneumatic Device   PT Next Visit Plan  continue 2x/wk for 2wks then 1x/wk for 2 wks. focus on ROM and higher level strengthening        Problem List Patient Active Problem List   Diagnosis Date Noted  . History of left knee replacement 11/25/2015  . Primary osteoarthritis of left knee 09/30/2015  . Primary osteoarthritis of knee 09/30/2015  . Primary osteoarthritis of left wrist 07/28/2015  . Olecranon bursitis of right elbow 06/16/2015  . Hemorrhoid 06/12/2014  . Absolute anemia 06/12/2014  . Abnormal facial hair 08/03/2013  . Trochanteric bursitis of both hips 06/12/2013  . Low back pain 05/21/2013  . Osteoarthritis of both knees 05/04/2013  . Metatarsalgia of left foot 05/04/2013  . Obesity 02/22/2012  . Migraine without aura 11/23/2011  . Chronic daily headache 11/23/2011  . Muscle spasm 11/23/2011  . Insomnia 11/23/2011  . RHEUMATOID ARTHRITIS 08/15/2008    Jeral Pinch PT 01/02/2016, 2:26 PM  [facility] [place], [place], [address] Phone: [phone]   Fax:  [phone]  Name: Rose Long MRN: [id number] Date of Birth: 07-19-1961

## 2016-01-05 ENCOUNTER — Ambulatory Visit (INDEPENDENT_AMBULATORY_CARE_PROVIDER_SITE_OTHER): Payer: Managed Care, Other (non HMO) | Admitting: Physical Therapy

## 2016-01-05 ENCOUNTER — Encounter: Payer: Self-pay | Admitting: Physical Therapy

## 2016-01-05 DIAGNOSIS — M25562 Pain in left knee: Secondary | ICD-10-CM

## 2016-01-05 DIAGNOSIS — R531 Weakness: Secondary | ICD-10-CM

## 2016-01-05 DIAGNOSIS — M25662 Stiffness of left knee, not elsewhere classified: Secondary | ICD-10-CM | POA: Diagnosis not present

## 2016-01-05 NOTE — Therapy (Signed)
[place], [place], [address] Phone: [phone]   Fax:  [phone]  Physical Therapy Treatment  Patient Details  Name: Rose Long MRN: [id number] Date of Birth: 09/24/1961 Referring Provider: Dr Bobbye Morton  Encounter Date: 01/05/2016      PT End of Session - 01/05/16 1435    Visit Number 27   Number of Visits 31   Date for PT Re-Evaluation 01/28/16   PT Start Time [id number]   PT Stop Time 1529   PT Time Calculation (min) 58 min      Past Medical History  Diagnosis Date  . Arthritis   . Obesity   . Anemia   . Simple ovarian cyst     Right  . Complication of anesthesia     PONV  . Family history of adverse reaction to anesthesia     pt's mother stopped breathing possibly due to sleep apnea    Past Surgical History  Procedure Laterality Date  . 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 Nasal sinus surgery  1999  . Breast surgery  1998    reduction  . Uterine ablation  2012  . Under arm skin removed    . Breast surgery  10/2013    Lift  . Abdominoplasty/panniculectomy    . Total knee arthroplasty Left 09/30/2015    Procedure: TOTAL KNEE ARTHROPLASTY;  Surgeon: Melrose Nakayama, MD;  Location: [facility];  Service: Orthopedics;  Laterality: Left;    There were no vitals filed for this visit.  Visit Diagnosis:  Stiffness of knee joint, left  Weakness generalized  Left knee pain      Subjective Assessment - 01/05/16 1432    Subjective Pt states she swelled up after the last treatment, could be weather related.    Currently in Pain? Yes   Pain Score 4    Pain Location Knee   Pain Orientation Left   Pain Descriptors / Indicators Aching   Pain Type Surgical pain   Pain Onset More than a month ago   Aggravating Factors  prolonged standing   Pain Relieving Factors moving around                         [facility] Adult PT Treatment/Exercise - 01/05/16 0001    Knee/Hip Exercises:  Stretches   Sports administrator Left;3 reps;30 seconds  prone with strap   Knee/Hip Exercises: Aerobic   Stationary Bike L5: 6 min    Knee/Hip Exercises: Standing   Forward Lunges Both;20 reps  in XTS pink band   Other Standing Knee Exercises Fitter one blue, one black band    Knee/Hip Exercises: Seated   Heel Slides AAROM;Left;10 reps  self mobs   Modalities   Modalities Electrical Stimulation;Moist Heat   Moist Heat Therapy   Number Minutes Moist Heat 15 Minutes   Moist Heat Location Knee  Lt front and back.    Acupuncturist Location Lt knee front, Lt hamstring   Electrical Stimulation Action premod   Electrical Stimulation Parameters to tolerance   Electrical Stimulation Goals Pain;Tone   Ultrasound   Ultrasound Location lateral Lt knee   Ultrasound Parameters 100%, 1.73mz, 1.5w/cm2   Ultrasound Goals Pain                     PT Long Term Goals - 12/31/15 1458    PT LONG TERM GOAL #1   Title Patient  I in HEP 01/27/16   Status On-going   PT LONG TERM GOAL #2   Title increase Lt knee flexion =/> 110 degrees to assit with stair climbing 01/28/16   Status On-going   PT LONG TERM GOAL #3   Title increase Lt knee extension =/< -1 degrees 12/26/15   Status Achieved   PT LONG TERM GOAL #4   Title ambulate without an assistive device and minimal gait deviations 12/26/15   Status Achieved   PT LONG TERM GOAL #5   Title Improve FOTO to </= 47% limitation 01/28/16   Status On-going   PT LONG TERM GOAL #6   Title report pain decrease =/< 2/10 after a work day 01/28/16   Status On-going   PT LONG TERM GOAL #7   Title increase strength bilat LEs's =/< 4+/5 2/14/174   Status Achieved               Plan - 01/05/16 1456    Clinical Impression Statement Kathy's knee has tightened up since her last visit.  She is having tightness in her Lt hamstrings and quads.  No new goals met   Pt will benefit from skilled therapeutic intervention in  order to improve on the following deficits Abnormal gait;Decreased range of motion;Pain;Decreased scar mobility;Hypomobility;Decreased strength;Increased edema   Rehab Potential Good   PT Frequency 2x / week   PT Duration 2 weeks  then 1x/wk x 2wks   PT Treatment/Interventions Ultrasound;Neuromuscular re-education;Scar mobilization;Biofeedback;Gait training;Patient/family education;Passive range of motion;Dry needling;Cryotherapy;Electrical Stimulation;Moist Heat;Therapeutic exercise;Manual techniques;Vasopneumatic Device   PT Next Visit Plan TDN if still having tightness   Consulted and Agree with Plan of Care Patient        Problem List Patient Active Problem List   Diagnosis Date Noted  . History of left knee replacement 11/25/2015  . Primary osteoarthritis of left knee 09/30/2015  . Primary osteoarthritis of knee 09/30/2015  . Primary osteoarthritis of left wrist 07/28/2015  . Olecranon bursitis of right elbow 06/16/2015  . Hemorrhoid 06/12/2014  . Absolute anemia 06/12/2014  . Abnormal facial hair 08/03/2013  . Trochanteric bursitis of both hips 06/12/2013  . Low back pain 05/21/2013  . Osteoarthritis of both knees 05/04/2013  . Metatarsalgia of left foot 05/04/2013  . Obesity 02/22/2012  . Migraine without aura 11/23/2011  . Chronic daily headache 11/23/2011  . Muscle spasm 11/23/2011  . Insomnia 11/23/2011  . RHEUMATOID ARTHRITIS 08/15/2008    Jeral Pinch PT 01/05/2016, 5:11 PM  [facility] [place], [place], [address] Phone: [phone]   Fax:  [phone]  Name: JHORDAN KINTER MRN: [id number] Date of Birth: 1961/09/30

## 2016-01-08 ENCOUNTER — Ambulatory Visit (INDEPENDENT_AMBULATORY_CARE_PROVIDER_SITE_OTHER): Payer: Managed Care, Other (non HMO) | Admitting: Physical Therapy

## 2016-01-08 DIAGNOSIS — R2240 Localized swelling, mass and lump, unspecified lower limb: Secondary | ICD-10-CM | POA: Diagnosis not present

## 2016-01-08 DIAGNOSIS — M25662 Stiffness of left knee, not elsewhere classified: Secondary | ICD-10-CM | POA: Diagnosis not present

## 2016-01-08 DIAGNOSIS — M25562 Pain in left knee: Secondary | ICD-10-CM | POA: Diagnosis not present

## 2016-01-08 DIAGNOSIS — R531 Weakness: Secondary | ICD-10-CM

## 2016-01-08 NOTE — Therapy (Signed)
[place], [place], [address] Phone: [phone]   Fax:  [phone]  Physical Therapy Treatment  Patient Details  Name: Rose Long MRN: [id number] Date of Birth: [birth year] Referring Provider: Dr Bobbye Morton  Encounter Date: 01/08/2016      PT End of Session - 01/08/16 1450    Visit Number 28   Number of Visits 31   Date for PT Re-Evaluation 01/28/16   PT Start Time [id number]   PT Stop Time 1558   PT Time Calculation (min) 71 min   Activity Tolerance Patient tolerated treatment well      Past Medical History  Diagnosis Date  . Arthritis   . Obesity   . Anemia   . Simple ovarian cyst     Right  . Complication of anesthesia     PONV  . Family history of adverse reaction to anesthesia     pt's mother stopped breathing possibly due to sleep apnea    Past Surgical History  Procedure Laterality Date  . 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 Nasal sinus surgery  1999  . Breast surgery  1998    reduction  . Uterine ablation  2012  . Under arm skin removed    . Breast surgery  10/2013    Lift  . Abdominoplasty/panniculectomy    . Total knee arthroplasty Left 09/30/2015    Procedure: TOTAL KNEE ARTHROPLASTY;  Surgeon: Melrose Nakayama, MD;  Location: [facility];  Service: Orthopedics;  Laterality: Left;    There were no vitals filed for this visit.  Visit Diagnosis:  Stiffness of knee joint, left  Weakness generalized  Left knee pain  Localized swelling of lower leg      Subjective Assessment - 01/08/16 1451    Subjective Pt states she is doing well today, used heat and the TENs unit and it feels like her knee has loosened up. Brought her TENs unit to ask question.    Currently in Pain? Yes   Pain Score 3    Pain Location Knee   Pain Orientation Left   Pain Descriptors / Indicators Aching            [facility] PT Assessment - 01/08/16 0001    Assessment   Medical Diagnosis Lt TKA    Referring Provider Dr Rhona Raider   Onset Date/Surgical Date 09/29/16   Hand Dominance Left   Next MD Visit 3 months.    AROM   Right/Left Knee Left   Left Knee Flexion 104  in low kneel                     [facility] Adult PT Treatment/Exercise - 01/08/16 0001    Self-Care   Self-Care --  home tens unit   Knee/Hip Exercises: Stretches   Sports administrator Left;3 reps;30 seconds  prone with strap   Other Knee/Hip Stretches 30 sec cross body stetch   Knee/Hip Exercises: Aerobic   Stationary Bike L6: 6 min    Knee/Hip Exercises: Seated   Other Seated Knee/Hip Exercises self mobs in low kneel    Knee/Hip Exercises: Supine   Other Supine Knee/Hip Exercises knee presses with foot in HS stretch   Modalities   Modalities Electrical Stimulation;Moist Heat   Moist Heat Therapy   Number Minutes Moist Heat 15 Minutes   Moist Heat Location Knee;Ankle  Lt   Acupuncturist Location --  Lt knee   Chartered certified accountant  ion repelling   Electrical Stimulation Parameters to tolerance   Electrical Stimulation Goals Pain;Tone   Ultrasound   Ultrasound Location Lt quad tendon   Ultrasound Parameters 100%, 1.5w/cm2, 3.28mHz   Ultrasound Goals Pain   Manual Therapy   Manual Therapy Joint mobilization   Joint Mobilization Lt knee ext                     PT Long Term Goals - 12/31/15 1458    PT LONG TERM GOAL #1   Title Patient I in HEP 01/27/16   Status On-going   PT LONG TERM GOAL #2   Title increase Lt knee flexion =/> 110 degrees to assit with stair climbing 01/28/16   Status On-going   PT LONG TERM GOAL #3   Title increase Lt knee extension =/< -1 degrees 12/26/15   Status Achieved   PT LONG TERM GOAL #4   Title ambulate without an assistive device and minimal gait deviations 12/26/15   Status Achieved   PT LONG TERM GOAL #5   Title Improve FOTO to </= 47% limitation 01/28/16   Status On-going   PT LONG TERM GOAL #6   Title  report pain decrease =/< 2/10 after a work day 01/28/16   Status On-going   PT LONG TERM GOAL #7   Title increase strength bilat LEs's =/< 4+/5 2/14/174   Status Achieved               Plan - 01/08/16 1549    Clinical Impression Statement Rose Long had increased knee flexion today, tolerated low lunge well    Rehab Potential Good   PT Frequency 2x / week   PT Treatment/Interventions Ultrasound;Neuromuscular re-education;Scar mobilization;Biofeedback;Gait training;Patient/family education;Passive range of motion;Dry needling;Cryotherapy;Electrical Stimulation;Moist Heat;Therapeutic exercise;Manual techniques;Vasopneumatic Device   PT Next Visit Plan cont ROM         Problem List Patient Active Problem List   Diagnosis Date Noted  . History of left knee replacement 11/25/2015  . Primary osteoarthritis of left knee 09/30/2015  . Primary osteoarthritis of knee 09/30/2015  . Primary osteoarthritis of left wrist 07/28/2015  . Olecranon bursitis of right elbow 06/16/2015  . Hemorrhoid 06/12/2014  . Absolute anemia 06/12/2014  . Abnormal facial hair 08/03/2013  . Trochanteric bursitis of both hips 06/12/2013  . Low back pain 05/21/2013  . Osteoarthritis of both knees 05/04/2013  . Metatarsalgia of left foot 05/04/2013  . Obesity 02/22/2012  . Migraine without aura 11/23/2011  . Chronic daily headache 11/23/2011  . Muscle spasm 11/23/2011  . Insomnia 11/23/2011  . RHEUMATOID ARTHRITIS 08/15/2008    Jeral Pinch PT  01/08/2016, 3:51 PM  [facility] [place], [place], [address] Phone: [phone]   Fax:  [phone]  Name: Rose Long MRN: [id number] Date of Birth: 06-25-1961

## 2016-01-14 ENCOUNTER — Ambulatory Visit (INDEPENDENT_AMBULATORY_CARE_PROVIDER_SITE_OTHER): Payer: Managed Care, Other (non HMO) | Admitting: Physical Therapy

## 2016-01-14 ENCOUNTER — Encounter: Payer: Self-pay | Admitting: Physical Therapy

## 2016-01-14 DIAGNOSIS — R2240 Localized swelling, mass and lump, unspecified lower limb: Secondary | ICD-10-CM | POA: Diagnosis not present

## 2016-01-14 DIAGNOSIS — M25562 Pain in left knee: Secondary | ICD-10-CM | POA: Diagnosis not present

## 2016-01-14 DIAGNOSIS — M25662 Stiffness of left knee, not elsewhere classified: Secondary | ICD-10-CM | POA: Diagnosis not present

## 2016-01-14 DIAGNOSIS — R269 Unspecified abnormalities of gait and mobility: Secondary | ICD-10-CM

## 2016-01-14 DIAGNOSIS — R531 Weakness: Secondary | ICD-10-CM | POA: Diagnosis not present

## 2016-01-14 NOTE — Therapy (Signed)
[place], [place], [address] Phone: [phone]   Fax:  [phone]  Physical Therapy Treatment  Patient Details  Name: Rose Long MRN: [id number] Date of Birth: 1960/10/25 Referring Provider: Dr Bobbye Morton  Encounter Date: 01/14/2016      PT End of Session - 01/14/16 1515    Visit Number 29   Number of Visits 31   Date for PT Re-Evaluation 01/28/16   PT Start Time [id number]   PT Stop Time 1528   PT Time Calculation (min) 57 min   Activity Tolerance Patient tolerated treatment well      Past Medical History  Diagnosis Date  . Arthritis   . Obesity   . Anemia   . Simple ovarian cyst     Right  . Complication of anesthesia     PONV  . Family history of adverse reaction to anesthesia     pt's mother stopped breathing possibly due to sleep apnea    Past Surgical History  Procedure Laterality Date  . 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 Nasal sinus surgery  1999  . Breast surgery  1998    reduction  . Uterine ablation  2012  . Under arm skin removed    . Breast surgery  10/2013    Lift  . Abdominoplasty/panniculectomy    . Total knee arthroplasty Left 09/30/2015    Procedure: TOTAL KNEE ARTHROPLASTY;  Surgeon: Melrose Nakayama, MD;  Location: [facility];  Service: Orthopedics;  Laterality: Left;    There were no vitals filed for this visit.  Visit Diagnosis:  Stiffness of knee joint, left  Weakness generalized  Left knee pain  Localized swelling of lower leg  Abnormality of gait      Subjective Assessment - 01/14/16 1436    Subjective Pt states she is having some increase in pain in her knee possible due to the weather.    Currently in Pain? Yes   Pain Score 4    Pain Location Knee   Pain Orientation Right   Pain Descriptors / Indicators Shooting   Pain Type Surgical pain   Pain Radiating Towards lateral lower leg   Pain Onset More than a month ago   Pain Frequency Intermittent    Aggravating Factors  weather   Pain Relieving Factors moving around            [facility] PT Assessment - 01/14/16 0001    Assessment   Medical Diagnosis Lt TKA    AROM   Right/Left Knee Left   Left Knee Flexion 93  sitting                     [facility] Adult PT Treatment/Exercise - 01/14/16 0001    Self-Care   Self-Care Other Self-Care Comments   Other Self-Care Comments  TPR on Lt hip  gut med   Knee/Hip Exercises: Stretches   Active Hamstring Stretch Left  holding while performing knee presses   Quad Stretch Left;3 reps  45 sec with strap, prone   Knee/Hip Exercises: Aerobic   Stationary Bike L6: 6 min    Knee/Hip Exercises: Standing   Forward Lunges Left;20 reps  VC for form   SLS with Vectors with red band  resistance   Other Standing Knee Exercises upside down BOSU with mini squats.                      PT Long Term Goals -  01/14/16 1444    PT LONG TERM GOAL #1   Title Patient I in HEP 01/27/16   Status On-going   PT LONG TERM GOAL #2   Title increase Lt knee flexion =/> 110 degrees to assit with stair climbing 01/28/16   Status On-going   PT LONG TERM GOAL #3   Title increase Lt knee extension =/< -1 degrees 12/26/15   Status Achieved   PT LONG TERM GOAL #4   Title ambulate without an assistive device and minimal gait deviations 12/26/15   Status Achieved   PT LONG TERM GOAL #5   Title Improve FOTO to </= 47% limitation 01/28/16   Status On-going   PT LONG TERM GOAL #6   Title report pain decrease =/< 2/10 after a work day 01/28/16   Status On-going   PT LONG TERM GOAL #7   Title increase strength bilat LEs's =/< 4+/5 2/14/174   Status Achieved               Plan - 01/14/16 1511    Clinical Impression Statement Rose Long states her knee feels stiff today and she had less flexion today than last week.    Pt will benefit from skilled therapeutic intervention in order to improve on the following deficits Abnormal gait;Decreased range  of motion;Pain;Decreased scar mobility;Hypomobility;Decreased strength;Increased edema   Rehab Potential Good   PT Frequency 1x / week   PT Treatment/Interventions Ultrasound;Neuromuscular re-education;Scar mobilization;Biofeedback;Gait training;Patient/family education;Passive range of motion;Dry needling;Cryotherapy;Electrical Stimulation;Moist Heat;Therapeutic exercise;Manual techniques;Vasopneumatic Device   PT Next Visit Plan FOTO, finalize HEP for D/C    Consulted and Agree with Plan of Care Patient        Problem List Patient Active Problem List   Diagnosis Date Noted  . History of left knee replacement 11/25/2015  . Primary osteoarthritis of left knee 09/30/2015  . Primary osteoarthritis of knee 09/30/2015  . Primary osteoarthritis of left wrist 07/28/2015  . Olecranon bursitis of right elbow 06/16/2015  . Hemorrhoid 06/12/2014  . Absolute anemia 06/12/2014  . Abnormal facial hair 08/03/2013  . Trochanteric bursitis of both hips 06/12/2013  . Low back pain 05/21/2013  . Osteoarthritis of both knees 05/04/2013  . Metatarsalgia of left foot 05/04/2013  . Obesity 02/22/2012  . Migraine without aura 11/23/2011  . Chronic daily headache 11/23/2011  . Muscle spasm 11/23/2011  . Insomnia 11/23/2011  . RHEUMATOID ARTHRITIS 08/15/2008    Jeral Pinch PT  01/14/2016, 3:16 PM  [facility] [place], [place], [address] Phone: [phone]   Fax:  [phone]  Name: Rose Long MRN: [id number] Date of Birth: [birth year]

## 2016-01-16 ENCOUNTER — Encounter: Payer: Managed Care, Other (non HMO) | Admitting: Physical Therapy

## 2016-01-21 ENCOUNTER — Ambulatory Visit (INDEPENDENT_AMBULATORY_CARE_PROVIDER_SITE_OTHER): Payer: Managed Care, Other (non HMO) | Admitting: Physical Therapy

## 2016-01-21 DIAGNOSIS — R531 Weakness: Secondary | ICD-10-CM | POA: Diagnosis not present

## 2016-01-21 DIAGNOSIS — R2240 Localized swelling, mass and lump, unspecified lower limb: Secondary | ICD-10-CM

## 2016-01-21 DIAGNOSIS — M25662 Stiffness of left knee, not elsewhere classified: Secondary | ICD-10-CM | POA: Diagnosis not present

## 2016-01-21 DIAGNOSIS — M25562 Pain in left knee: Secondary | ICD-10-CM

## 2016-01-21 NOTE — Patient Instructions (Signed)

## 2016-01-21 NOTE — Therapy (Signed)
Rose Long [place], [place], [address] Phone: [phone]   Fax:  [phone]  Physical Therapy Treatment  Patient Details  Name: Rose Long MRN: [id number] Date of Birth: [birth year] Referring Provider: Dr Latanya Maudlin  Encounter Date: 01/21/2016      PT End of Session - 01/21/16 1433    Visit Number 30   Number of Visits 31   Date for PT Re-Evaluation 01/28/16   PT Start Time 1430   PT Stop Time 1522   PT Time Calculation (min) 52 min   Activity Tolerance Patient limited by pain  migraine      Past Medical History  Diagnosis Date  . Arthritis   . Obesity   . Anemia   . Simple ovarian cyst     Right  . Complication of anesthesia     PONV  . Family history of adverse reaction to anesthesia     pt's mother stopped breathing possibly due to sleep apnea    Past Surgical History  Procedure Laterality Date  . 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 Nasal sinus surgery  1999  . Breast surgery  1998    reduction  . Uterine ablation  2012  . Under arm skin removed    . Breast surgery  10/2013    Lift  . Abdominoplasty/panniculectomy    . Total knee arthroplasty Left 09/30/2015    Procedure: TOTAL KNEE ARTHROPLASTY;  Surgeon: Melrose Nakayama, MD;  Location: [facility];  Service: Orthopedics;  Laterality: Left;    There were no vitals filed for this visit.  Visit Diagnosis:  Stiffness of knee joint, left  Weakness generalized  Left knee pain  Localized swelling of lower leg      Subjective Assessment - 01/21/16 1431    Subjective Pt states that she has elimated sugar from her diet and has had a mirgraine for the last 3 days so not feeling well. Started back at the gym on Monday   Currently in Pain? Yes   Pain Score 4    Pain Location Knee   Pain Orientation Right   Pain Descriptors / Indicators Aching;Dull   Pain Type Surgical pain   Pain Frequency Intermittent   Aggravating Factors  weather   Pain Relieving Factors stretching and moving             [facility] PT Assessment - 01/21/16 0001    Assessment   Medical Diagnosis Lt TKA    Referring Provider Dr Latanya Maudlin   Onset Date/Surgical Date 09/29/16   Hand Dominance Left   Next MD Visit 3 months.    Observation/Other Assessments   Focus on Therapeutic Outcomes (FOTO)  44% limited   AROM   AROM Assessment Site Knee   Right/Left Knee Left   Left Knee Flexion 105  sitting on edge of bed   Strength   Left Hip Flexion 5/5   Left Hip Extension 5/5   Left Hip ABduction --  5-/5   Left Knee Flexion 5/5   Left Knee Extension --  5-/5                     [facility] Adult PT Treatment/Exercise - 01/21/16 0001    Self-Care   Self-Care Other Self-Care Comments  reviewed exercise to do at the gym and answered others    Knee/Hip Exercises: Stretches   Passive Hamstring Stretch Left;3 reps;30 seconds  with strap   Quad Stretch Left;3 reps;30 seconds  prone with strap   Knee/Hip Exercises: Aerobic   Stationary Bike L5: 5 min    Knee/Hip Exercises: Standing   Other Standing Knee Exercises retrostep with Lt LE back.   Knee/Hip Exercises: Supine   Bridges with Clamshell Strengthening;Both;4 sets;10 reps   Straight Leg Raise with External Rotation Strengthening;Left;3 sets;10 reps   Modalities   Modalities Electrical Stimulation   Moist Heat Therapy   Number Minutes Moist Heat 15 Minutes   Moist Heat Location Knee  Lt   Electrical Stimulation   Electrical Stimulation Location Lt knee front, Lt hamstring   Electrical Stimulation Action ion repelling   Electrical Stimulation Parameters to tolerance   Electrical Stimulation Goals Pain;Tone                     PT Long Term Goals - 01/21/16 1433    PT LONG TERM GOAL #1   Title Patient I in HEP 01/27/16   Status Achieved   PT LONG TERM GOAL #2   Title increase Lt knee flexion =/> 110 degrees to assit with stair climbing 01/28/16   Status Not Met  up to  105 degrees   PT LONG TERM GOAL #3   Title increase Lt knee extension =/< -1 degrees 12/26/15   Status Achieved   PT LONG TERM GOAL #4   Title ambulate without an assistive device and minimal gait deviations 12/26/15   Status Achieved   PT LONG TERM GOAL #5   Title Improve FOTO to </= 47% limitation 01/28/16   Status Achieved  scored 44% limited   PT LONG TERM GOAL #6   Title report pain decrease =/< 2/10 after a work day 01/28/16   Status Not Met  lowest it has gotten is 3/10   PT LONG TERM GOAL #7   Title increase strength bilat LEs's =/< 4+/5 2/14/174   Status Achieved               Plan - 01/21/16 1510    Clinical Impression Statement Rose Long has maximized her rehab potential and met most of her goals.  She is ready for D/C to HEP and the gym.     PT Next Visit Plan D/C    Consulted and Agree with Plan of Care Patient        Problem List Patient Active Problem List   Diagnosis Date Noted  . History of left knee replacement 11/25/2015  . Primary osteoarthritis of left knee 09/30/2015  . Primary osteoarthritis of knee 09/30/2015  . Primary osteoarthritis of left wrist 07/28/2015  . Olecranon bursitis of right elbow 06/16/2015  . Hemorrhoid 06/12/2014  . Absolute anemia 06/12/2014  . Abnormal facial hair 08/03/2013  . Trochanteric bursitis of both hips 06/12/2013  . Low back pain 05/21/2013  . Osteoarthritis of both knees 05/04/2013  . Metatarsalgia of left foot 05/04/2013  . Obesity 02/22/2012  . Migraine without aura 11/23/2011  . Chronic daily headache 11/23/2011  . Muscle spasm 11/23/2011  . Insomnia 11/23/2011  . RHEUMATOID ARTHRITIS 08/15/2008    Jeral Pinch PT  01/21/2016, 3:14 PM  [facility] [place], [place], [address] Phone: [phone]   Fax:  [phone]  Name: Rose Long MRN: [id number] Date of Birth: 1961-07-30   PHYSICAL THERAPY DISCHARGE SUMMARY  Visits  from Start of Care: 30  Current functional level related to goals / functional outcomes: See above, she is still some tight in knee flexion.  Remaining deficits: See above   Education / Equipment: HEP Plan: Patient agrees to discharge.  Patient goals were partially met. Patient is being discharged due to                                                    reaching maximal rehab potential  ?????   Jeral Pinch, PT 01/21/2016 3:16 PM

## 2016-02-16 ENCOUNTER — Ambulatory Visit (INDEPENDENT_AMBULATORY_CARE_PROVIDER_SITE_OTHER): Payer: Managed Care, Other (non HMO) | Admitting: Sports Medicine

## 2016-02-16 VITALS — BP 121/81 | HR 75 | Resp 16 | Wt 209.6 lb

## 2016-02-16 DIAGNOSIS — M7742 Metatarsalgia, left foot: Secondary | ICD-10-CM

## 2016-02-16 MED ORDER — VITAMIN D3 125 MCG (5000 UT) PO TABS: 1.0000 | ORAL_TABLET | Freq: Two times a day (BID) | ORAL | Status: DC

## 2016-02-16 MED ORDER — FUSION PLUS PO CAPS: 1.0000 | ORAL_CAPSULE | Freq: Every day | ORAL | Status: DC

## 2016-02-16 NOTE — Assessment & Plan Note (Signed)
Now that she is post knee arthroplasty, new set of custom orthotics, no metatarsal pad today, we can re-add this if symptoms return.

## 2016-02-16 NOTE — Progress Notes (Signed)

## 2016-04-23 ENCOUNTER — Other Ambulatory Visit: Payer: Self-pay | Admitting: Sports Medicine

## 2016-06-17 ENCOUNTER — Ambulatory Visit (INDEPENDENT_AMBULATORY_CARE_PROVIDER_SITE_OTHER): Payer: Managed Care, Other (non HMO)

## 2016-06-17 ENCOUNTER — Ambulatory Visit (INDEPENDENT_AMBULATORY_CARE_PROVIDER_SITE_OTHER): Payer: Managed Care, Other (non HMO) | Admitting: Sports Medicine

## 2016-06-17 DIAGNOSIS — S62616A Displaced fracture of proximal phalanx of right little finger, initial encounter for closed fracture: Secondary | ICD-10-CM

## 2016-06-17 DIAGNOSIS — S92514A Nondisplaced fracture of proximal phalanx of right lesser toe(s), initial encounter for closed fracture: Secondary | ICD-10-CM

## 2016-06-17 DIAGNOSIS — W19XXXA Unspecified fall, initial encounter: Secondary | ICD-10-CM

## 2016-06-17 DIAGNOSIS — S6991XA Unspecified injury of right wrist, hand and finger(s), initial encounter: Secondary | ICD-10-CM

## 2016-06-17 NOTE — Progress Notes (Signed)
   Subjective:    I'm seeing this patient as a consultation for:  Dr. Beatrice Lecher  CC: Right hand injury  HPI: To a half weeks ago this pleasant 55 year old female fell on her hand, she had immediate pain, swelling, bruising, it has improved slightly but she still has pain that she localizes at the fifth metacarpal phalangeal joint, moderate, persistent without radiation, good motion.  Past medical history, Surgical history, Family history not pertinant except as noted below, Social history, Allergies, and medications have been entered into the medical record, reviewed, and no changes needed.   Review of Systems: No headache, visual changes, nausea, vomiting, diarrhea, constipation, dizziness, abdominal pain, skin rash, fevers, chills, night sweats, weight loss, swollen lymph nodes, body aches, joint swelling, muscle aches, chest pain, shortness of breath, mood changes, visual or auditory hallucinations.   Objective:   General: Well Developed, well nourished, and in no acute distress.  Neuro/Psych: Alert and oriented x3, extra-ocular muscles intact, able to move all 4 extremities, sensation grossly intact. Skin: Warm and dry, no rashes noted.  Respiratory: Not using accessory muscles, speaking in full sentences, trachea midline.  Cardiovascular: Pulses palpable, no extremity edema. Abdomen: Does not appear distended. Right hand: Tender to palpation over the fifth MCP, collaterals are stable, good range of motion and strength, neurovascularly intact distally.  X-rays show a minimally displaced fracture at the articular base of the fifth proximal phalanx, fourth and fifth fingers were buddy taped together.  Impression and Recommendations:   This case required medical decision making of moderate complexity.  Fracture of fifth finger, proximal phalanx, right, closed After a fall 2 weeks ago with pain at the fifth MCP.  X-rays.  X-rays show a minimally displaced fracture at the  articular base of the fifth proximal phalanx, buddy taped the fourth and fifth fingers together. No analgesics needed.  I billed a fracture code for this encounter, all subsequent visits will be post-op checks in the global period.

## 2016-06-17 NOTE — Assessment & Plan Note (Addendum)
After a fall 2 weeks ago with pain at the fifth MCP.  X-rays.  X-rays show a minimally displaced fracture at the articular base of the fifth proximal phalanx, buddy taped the fourth and fifth fingers together. No analgesics needed.  I billed a fracture code for this encounter, all subsequent visits will be post-op checks in the global period.

## 2016-06-23 ENCOUNTER — Other Ambulatory Visit: Payer: Self-pay | Admitting: Family Medicine

## 2016-07-12 ENCOUNTER — Ambulatory Visit: Payer: Managed Care, Other (non HMO) | Admitting: Sports Medicine

## 2016-07-19 ENCOUNTER — Ambulatory Visit (INDEPENDENT_AMBULATORY_CARE_PROVIDER_SITE_OTHER): Payer: Managed Care, Other (non HMO) | Admitting: Sports Medicine

## 2016-07-19 ENCOUNTER — Encounter: Payer: Self-pay | Admitting: Sports Medicine

## 2016-07-19 DIAGNOSIS — S62646D Nondisplaced fracture of proximal phalanx of right little finger, subsequent encounter for fracture with routine healing: Secondary | ICD-10-CM

## 2016-07-19 DIAGNOSIS — G2581 Restless legs syndrome: Secondary | ICD-10-CM

## 2016-07-19 LAB — POCT HEMOGLOBIN: Hemoglobin: 12.9 g/dL (ref 12.2–16.2)

## 2016-07-19 MED ORDER — ROPINIROLE HCL 1 MG PO TABS: 1.0000 mg | ORAL_TABLET | Freq: Every day | ORAL | 3 refills | Status: DC

## 2016-07-19 NOTE — Progress Notes (Signed)
  Subjective:    CC: Follow-up  HPI: Finger fracture: Resolved after 4 weeks.  Restless leg syndrome: Patient suspects the diagnoses, creepy crawly sensations at night keeping her from sleeping. Symptoms are moderate, persistent, present for several months now. Moderate, persistent, no pain.  Past medical history:  Negative.  See flowsheet/record as well for more information.  Surgical history: Negative.  See flowsheet/record as well for more information.  Family history: Negative.  See flowsheet/record as well for more information.  Social history: Negative.  See flowsheet/record as well for more information.  Allergies, and medications have been entered into the medical record, reviewed, and no changes needed.   Review of Systems: No fevers, chills, night sweats, weight loss, chest pain, or shortness of breath.   Objective:    General: Well Developed, well nourished, and in no acute distress.  Neuro: Alert and oriented x3, extra-ocular muscles intact, sensation grossly intact.  HEENT: Normocephalic, atraumatic, pupils equal round reactive to light, neck supple, no masses, no lymphadenopathy, thyroid nonpalpable.  Skin: Warm and dry, no rashes. Cardiac: Regular rate and rhythm, no murmurs rubs or gallops, no lower extremity edema.  Respiratory: Clear to auscultation bilaterally. Not using accessory muscles, speaking in full sentences.  Impression and Recommendations:    Closed fracture of proximal phalanx of right little finger Clinically resolved, return as needed.  Restless legs syndrome Starting Requip 1 mg. Return in one month, we can double the dose if needed. Hemoglobin was normal.  I spent 25 minutes with this patient, greater than 50% was face-to-face time counseling regarding the above diagnoses

## 2016-07-19 NOTE — Assessment & Plan Note (Signed)
Clinically resolved, return as needed. 

## 2016-07-19 NOTE — Assessment & Plan Note (Signed)
Starting Requip 1 mg. Return in one month, we can double the dose if needed. Hemoglobin was normal.

## 2016-08-02 ENCOUNTER — Encounter: Payer: Self-pay | Admitting: Sports Medicine

## 2016-08-02 ENCOUNTER — Ambulatory Visit (INDEPENDENT_AMBULATORY_CARE_PROVIDER_SITE_OTHER): Payer: Managed Care, Other (non HMO) | Admitting: Sports Medicine

## 2016-08-02 DIAGNOSIS — G4700 Insomnia, unspecified: Secondary | ICD-10-CM

## 2016-08-02 DIAGNOSIS — G2581 Restless legs syndrome: Secondary | ICD-10-CM

## 2016-08-02 MED ORDER — SUVOREXANT 20 MG PO TABS: 1.0000 | ORAL_TABLET | Freq: Every day | ORAL | 0 refills | Status: DC

## 2016-08-02 MED ORDER — ROPINIROLE HCL 1 MG PO TABS: 1.0000 mg | ORAL_TABLET | Freq: Every day | ORAL | 3 refills | Status: DC

## 2016-08-02 NOTE — Assessment & Plan Note (Signed)
Has been on Ambien continuous release, Lunesta, Sonata, and 10 mg of Belsomra. Increasing to 20 mg of Belsomra. Return in one month to see how things are going.

## 2016-08-02 NOTE — Assessment & Plan Note (Signed)
Doing well 2 mg of Requip at night.

## 2016-08-02 NOTE — Progress Notes (Signed)
  Subjective:    CC: restless leg syndrome follow-up   HPI: 55 yo with restless leg syndrome presenting for follow-up after starting Requip for restless leg syndrome.  1mg  of Requip did not give her relief, so now she has increased it to 2mg  nightly.  She says this provides good relief of symptoms.  She is not having any symptoms today.   Past medical history:  Negative.  See flowsheet/record as well for more information.  Surgical history: Negative.  See flowsheet/record as well for more information.  Family history: Negative.  See flowsheet/record as well for more information.  Social history: Negative.  See flowsheet/record as well for more information.  Allergies, and medications have been entered into the medical record, reviewed, and no changes needed.   Review of Systems: No fevers, chills, night sweats, weight loss, chest pain, or shortness of breath.   Objective:    General: Well Developed, well nourished, and in no acute distress.  Neuro: Alert and oriented x3, extra-ocular muscles intact, sensation grossly intact.  HEENT: Normocephalic, atraumatic, pupils equal round reactive to light, neck supple, no masses, no lymphadenopathy, thyroid nonpalpable.  Skin: Warm and dry, no rashes. Cardiac: Regular rate and rhythm, no murmurs rubs or gallops, no lower extremity edema.  Respiratory: Clear to auscultation bilaterally. Not using accessory muscles, speaking in full sentences.   Impression and Recommendations:    1. Restless leg syndrome: improved on 2mg  of Requip -Continue 2mg  Requip PRN

## 2016-08-09 ENCOUNTER — Other Ambulatory Visit: Payer: Self-pay | Admitting: *Deleted

## 2016-08-09 MED ORDER — SUMATRIPTAN SUCCINATE 100 MG PO TABS: ORAL_TABLET | ORAL | 4 refills | Status: DC

## 2016-08-09 MED ORDER — FUSION PLUS PO CAPS: 1.0000 | ORAL_CAPSULE | Freq: Every day | ORAL | 1 refills | Status: DC

## 2016-08-09 NOTE — Progress Notes (Signed)
Request for 90 day  supply

## 2016-08-10 ENCOUNTER — Telehealth: Payer: Self-pay

## 2016-08-10 DIAGNOSIS — G2581 Restless legs syndrome: Secondary | ICD-10-CM

## 2016-08-10 MED ORDER — ROPINIROLE HCL 1 MG PO TABS: 4.0000 mg | ORAL_TABLET | Freq: Every day | ORAL | 3 refills | Status: DC

## 2016-08-10 NOTE — Telephone Encounter (Signed)
Pt states you recently told her that you would write her for 4 tabs nightly but she couldn't get a refill. Please assist.

## 2016-08-10 NOTE — Telephone Encounter (Signed)
I have rewritten the prescription

## 2016-08-11 NOTE — Telephone Encounter (Signed)
Notified. 

## 2016-08-12 ENCOUNTER — Other Ambulatory Visit: Payer: Self-pay | Admitting: Sports Medicine

## 2016-08-12 DIAGNOSIS — G4700 Insomnia, unspecified: Secondary | ICD-10-CM

## 2016-08-13 ENCOUNTER — Other Ambulatory Visit: Payer: Self-pay

## 2016-08-13 DIAGNOSIS — G4700 Insomnia, unspecified: Secondary | ICD-10-CM

## 2016-08-13 MED ORDER — SUVOREXANT 20 MG PO TABS: 1.0000 | ORAL_TABLET | Freq: Every day | ORAL | 1 refills | Status: DC

## 2016-08-26 ENCOUNTER — Ambulatory Visit (INDEPENDENT_AMBULATORY_CARE_PROVIDER_SITE_OTHER): Payer: Managed Care, Other (non HMO) | Admitting: Sports Medicine

## 2016-08-26 ENCOUNTER — Encounter (HOSPITAL_COMMUNITY): Payer: Self-pay

## 2016-08-26 ENCOUNTER — Encounter: Payer: Self-pay | Admitting: Sports Medicine

## 2016-08-26 DIAGNOSIS — G43009 Migraine without aura, not intractable, without status migrainosus: Secondary | ICD-10-CM

## 2016-08-26 DIAGNOSIS — E669 Obesity, unspecified: Secondary | ICD-10-CM | POA: Diagnosis not present

## 2016-08-26 DIAGNOSIS — G4700 Insomnia, unspecified: Secondary | ICD-10-CM

## 2016-08-26 DIAGNOSIS — G2581 Restless legs syndrome: Secondary | ICD-10-CM | POA: Diagnosis not present

## 2016-08-26 MED ORDER — PHENTERMINE HCL 37.5 MG PO TABS: ORAL_TABLET | ORAL | 0 refills | Status: DC

## 2016-08-26 NOTE — Assessment & Plan Note (Signed)
Discontinued Requip and put a bar of soap under her covers and symptoms resolved.

## 2016-08-26 NOTE — Assessment & Plan Note (Signed)
Persistent headaches, usually Imitrex takes care of this. She is now waking up with headaches. Checking electrolytes, bring MRI of the brain with and without contrast, and referral to neurology for assistance.

## 2016-08-26 NOTE — Assessment & Plan Note (Signed)
Concerned that Belsomra is giving her headaches, I have asked her to stop it for some time to see if the headaches change.

## 2016-08-26 NOTE — Assessment & Plan Note (Signed)
Starting phentermine, follow-up with Dr. Madilyn Fireman for follow-up weight checks. We did discuss other medications for maintenance of weight loss.

## 2016-08-26 NOTE — Progress Notes (Signed)
  Subjective:    CC: Follow-up  HPI: Restless leg syndrome: Stopped her Requip and placed a bar of soap under her bedding, and symptoms improved. We both agreed that this was very odd.  Abnormal weight gain: Eating married soon, agrees to follow up with PCP for follow-up of her weight.  Insomnia: Unsure as to whether Belsomra at 20 mg has actually helped.  Headaches: Wakes up in the middle of the night with severe throbbing unilateral headaches. Typically takes Imitrex but this doesn't help. Has never had a brain MRI.   Past medical history:  Negative.  See flowsheet/record as well for more information.  Surgical history: Negative.  See flowsheet/record as well for more information.  Family history: Negative.  See flowsheet/record as well for more information.  Social history: Negative.  See flowsheet/record as well for more information.  Allergies, and medications have been entered into the medical record, reviewed, and no changes needed.   Review of Systems: No fevers, chills, night sweats, weight loss, chest pain, or shortness of breath.   Objective:    General: Well Developed, well nourished, and in no acute distress.  Neuro: Alert and oriented x3, extra-ocular muscles intact, sensation grossly intact.  HEENT: Normocephalic, atraumatic, pupils equal round reactive to light, neck supple, no masses, no lymphadenopathy, thyroid nonpalpable.  Skin: Warm and dry, no rashes. Cardiac: Regular rate and rhythm, no murmurs rubs or gallops, no lower extremity edema.  Respiratory: Clear to auscultation bilaterally. Not using accessory muscles, speaking in full sentences.  Impression and Recommendations:    Obesity Starting phentermine, follow-up with Dr. Madilyn Fireman for follow-up weight checks. We did discuss other medications for maintenance of weight loss.  Restless legs syndrome Discontinued Requip and put a bar of soap under her covers and symptoms resolved.  Insomnia Concerned that  Belsomra is giving her headaches, I have asked her to stop it for some time to see if the headaches change.  Migraine without aura Persistent headaches, usually Imitrex takes care of this. She is now waking up with headaches. Checking electrolytes, bring MRI of the brain with and without contrast, and referral to neurology for assistance.

## 2016-08-27 LAB — BASIC METABOLIC PANEL WITH GFR
Chloride: 104 mmol/L (ref 98–110)
Potassium: 4 mmol/L (ref 3.5–5.3)
Sodium: 139 mmol/L (ref 135–146)

## 2016-08-27 LAB — BASIC METABOLIC PANEL
BUN: 10 mg/dL (ref 7–25)
CO2: 27 mmol/L (ref 20–31)
Calcium: 9.1 mg/dL (ref 8.6–10.4)
Creat: 0.61 mg/dL (ref 0.50–1.05)
Glucose, Bld: 84 mg/dL (ref 65–99)

## 2016-09-06 ENCOUNTER — Other Ambulatory Visit: Payer: Managed Care, Other (non HMO)

## 2016-09-06 ENCOUNTER — Ambulatory Visit (INDEPENDENT_AMBULATORY_CARE_PROVIDER_SITE_OTHER): Payer: Managed Care, Other (non HMO)

## 2016-09-06 DIAGNOSIS — G4489 Other headache syndrome: Secondary | ICD-10-CM

## 2016-09-06 MED ORDER — GADOBENATE DIMEGLUMINE 529 MG/ML IV SOLN
20.0000 mL | Freq: Once | INTRAVENOUS | Status: AC | PRN
  Administered 2016-09-06: 19 mL via INTRAVENOUS

## 2016-09-22 ENCOUNTER — Ambulatory Visit: Payer: Managed Care, Other (non HMO) | Admitting: Family Medicine

## 2016-09-23 ENCOUNTER — Ambulatory Visit: Payer: Managed Care, Other (non HMO) | Admitting: Family Medicine

## 2016-09-29 ENCOUNTER — Encounter: Payer: Self-pay | Admitting: Neurology

## 2016-09-29 ENCOUNTER — Ambulatory Visit (INDEPENDENT_AMBULATORY_CARE_PROVIDER_SITE_OTHER): Payer: Managed Care, Other (non HMO) | Admitting: Neurology

## 2016-09-29 VITALS — Ht 66.5 in | Wt 217.0 lb

## 2016-09-29 DIAGNOSIS — G43019 Migraine without aura, intractable, without status migrainosus: Secondary | ICD-10-CM | POA: Diagnosis not present

## 2016-09-29 MED ORDER — TOPIRAMATE 25 MG PO TABS
ORAL_TABLET | ORAL | 3 refills | Status: DC
Start: 2016-09-29 — End: 2016-12-28

## 2016-09-29 NOTE — Patient Instructions (Signed)
   Topamax (topiramate) is a seizure medication that has an FDA approval for seizures and for migraine headache. Potential side effects of this medication include weight loss, cognitive slowing, tingling in the fingers and toes, and carbonated drinks will taste bad. If any significant side effects are noted on this drug, please contact our office.   May try magnesium suplimentation 250 mg daily and riboflavin 400 mg daily.

## 2016-09-29 NOTE — Progress Notes (Signed)
Reason for visit: Migraine headache  Referring physician: Dr. Darrel Reach Rose Long is a 55 y.o. female  History of present illness:  [name] is a 55 year old left-handed white female with a history of migraine headaches since age 26. The patient claims that throughout her entire life her headaches have been daily in nature, with severe headaches occurring less frequently. The patient has noted a recent increase in the frequency of the severe headaches occurring 2 or 3 times a week now. The headaches are often times bifrontal and in the vertex of the head, occasionally in the occipital area. The patient may have some nausea, but she does not vomit with the headache. She does report photophobia and phonophobia. She may feel somewhat spacey when the headache is present. She oftentimes will wake up with the headache, and she reports a lifelong problem with insomnia. The patient is not sure of any particular activating factors for the headache. She denies any numbness or weakness of the face, arms, or legs associated with the headache. She does have some occasional numbness in the hands and feet. She denies any severe balance problems but she is slightly off balance. She denies issues controlling the bowels or the bladder. Her mother also has migraine headaches. She does not take in caffeinated products to excess. In the past, she has been tried on Ultram, Flexeril, Fioricet, and she currently is on Imitrex. She believes that she may have been on Topamax in the distant past, but he has been greater than 15 years since this was used. She does not recall if it was effective. The patient denies any significant neck stiffness with headache. She is referred to this office for an evaluation. A recent MRI of the brain was done and was unremarkable.  Past Medical History:  Diagnosis Date  . Anemia   . Arthritis   . Complication of anesthesia    PONV  . Family history of adverse reaction to  anesthesia    pt's mother stopped breathing possibly due to sleep apnea  . Migraine   . Obesity   . Simple ovarian cyst    Right    Past Surgical History:  Procedure Laterality Date  . ABDOMINOPLASTY/PANNICULECTOMY    . BREAST SURGERY  1998   reduction  . BREAST SURGERY  10/2013   Lift, tummy tuck  . NASAL SINUS SURGERY  1999  . 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 under arm skin removed    . uterine ablation  2012    Family History  Problem Relation Age of Onset  . Hypertension Mother   . Bladder Cancer Mother   . Hyperlipidemia Mother   . Migraines Mother   . Arthritis Mother   . Obesity Mother   . Hypertension Father   . CVA Father   . Heart failure Father   . Arthritis Father   . Hyperlipidemia Father   . Kidney failure Father   . Heart disease Maternal Grandfather   . Lung cancer Paternal Grandmother     lung  . Alcohol abuse Paternal Uncle   . Diabetes Cousin   . Arthritis Sister   . Hypertension Brother   . Arthritis Brother   . Obesity Brother     Social history:  reports that she has never smoked. She has never used smokeless  tobacco. She reports that she drinks about 0.6 oz of alcohol per week . She reports that she does not use drugs.  Medications:  Prior to Admission medications   Medication Sig Start Date End Date Taking? Authorizing Provider  calcium citrate-vitamin D 200-200 MG-UNIT TABS Take 1 tablet by mouth daily.     Yes Historical Provider, MD  Cholecalciferol (VITAMIN D3) 5000 units TABS Take 1 tablet (5,000 Units total) by mouth 2 (two) times daily. 02/16/16  Yes Hali Marry, MD  ferrous sulfate 325 (65 FE) MG tablet Take 325 mg by mouth daily with breakfast.   Yes Historical Provider, MD  Iron-FA-B Cmp-C-Biot-Probiotic (FUSION PLUS) CAPS Take 1 capsule by mouth daily.  08/09/16  Yes Hali Marry, MD  MAGNESIUM CITRATE PO Take 1 tablet by mouth 2 (two) times daily.    Yes Historical Provider, MD  Multiple Vitamin (MULTIVITAMIN) tablet Take 1 tablet by mouth daily.     Yes Historical Provider, MD  phentermine (ADIPEX-P) 37.5 MG tablet One tab by mouth qAM 08/26/16  Yes Silverio Decamp, MD  SUMAtriptan (IMITREX) 100 MG tablet TAKE ONE TABLET BY MOUTH ONCE AS NEEDED FOR MIGRAINE 90 day supply for mail order Patient taking differently: 50 mg. TAKE ONE TABLET BY MOUTH ONCE AS NEEDED FOR MIGRAINE 90 day supply for mail order 08/09/16  Yes Hali Marry, MD      Allergies  Allergen Reactions  . Sulfa Antibiotics Anaphylaxis  . Sulfamethoxazole-Trimethoprim Anaphylaxis, Swelling and Rash    Redness and swelling  . Bee Venom     ROS:  Out of a complete 14 system review of symptoms, the patient complains only of the following symptoms, and all other reviewed systems are negative.  Weight gain, fatigue Blurred vision Anemia, easy bruising Feeling hot Joint pain, joint swelling Runny nose, skin sensitivity Constipation Headache Not enough sleep, decreased energy Insomnia, sleepiness, restless legs  Height 5' 6.5" (1.689 m), weight 217 lb (98.4 kg).  Physical Exam  General: The patient is alert and cooperative at the time of the examination. The patient is moderately obese.  Eyes: Pupils are equal, round, and reactive to light. Discs are flat bilaterally.  Neck: The neck is supple, no carotid bruits are noted.  Respiratory: The respiratory examination is clear.  Cardiovascular: The cardiovascular examination reveals a regular rate and rhythm, no obvious murmurs or rubs are noted.  Skin: Extremities are without significant edema.  Neurologic Exam  Mental status: The patient is alert and oriented x 3 at the time of the examination. The patient has apparent normal recent and remote memory, with an apparently normal attention  span and concentration ability.  Cranial nerves: Facial symmetry is present. There is good sensation of the face to pinprick and soft touch bilaterally. The strength of the facial muscles and the muscles to head turning and shoulder shrug are normal bilaterally. Speech is well enunciated, no aphasia or dysarthria is noted. Extraocular movements are full. Visual fields are full. The tongue is midline, and the patient has symmetric elevation of the soft palate. No obvious hearing deficits are noted.  Motor: The motor testing reveals 5 over 5 strength of all 4 extremities. Good symmetric motor tone is noted throughout.  Sensory: Sensory testing is intact to pinprick, soft touch, vibration sensation, and position sense on all 4 extremities. No evidence of extinction is noted.  Coordination: Cerebellar testing reveals good finger-nose-finger and heel-to-shin bilaterally.  Gait and station: Gait is normal. Tandem gait is normal. Romberg  is negative. No drift is seen.  Reflexes: Deep tendon reflexes are symmetric and normal bilaterally. Toes are downgoing bilaterally.   Assessment/Plan:  1. Intractable migraine headache  2. Chronic insomnia  The patient is having daily headaches and severe headaches 2 or 3 times a week. The Imitrex does help some, but she does not get enough medication to last her the month. The patient is at risk for rebound headaches. She will be placed on Topamax, gradually going up on the dose. She is to call for any dose adjustments. I have recommended going on magnesium supplementation and riboflavin for the headache as well. She will follow-up in 3 months, but she will call for dose adjustments before the next revisit. In the future, she may be a candidate for Botox if she fails several daily prophylactic medications.   Jill Alexanders MD 09/29/2016 3:24 PM  [facility] [address] [place], [place] [address]  Phone [phone] Fax  [phone]

## 2016-10-01 ENCOUNTER — Ambulatory Visit: Payer: Managed Care, Other (non HMO) | Admitting: Family Medicine

## 2016-10-06 ENCOUNTER — Ambulatory Visit (INDEPENDENT_AMBULATORY_CARE_PROVIDER_SITE_OTHER): Payer: Managed Care, Other (non HMO) | Admitting: Family Medicine

## 2016-10-06 ENCOUNTER — Encounter: Payer: Self-pay | Admitting: Family Medicine

## 2016-10-06 VITALS — BP 118/71 | HR 80 | Ht 67.0 in | Wt 217.0 lb

## 2016-10-06 DIAGNOSIS — E669 Obesity, unspecified: Secondary | ICD-10-CM | POA: Diagnosis not present

## 2016-10-06 DIAGNOSIS — Z6833 Body mass index (BMI) 33.0-33.9, adult: Secondary | ICD-10-CM

## 2016-10-06 DIAGNOSIS — G43019 Migraine without aura, intractable, without status migrainosus: Secondary | ICD-10-CM | POA: Diagnosis not present

## 2016-10-06 MED ORDER — PHENTERMINE HCL 37.5 MG PO TABS: ORAL_TABLET | ORAL | 0 refills | Status: DC

## 2016-10-06 NOTE — Progress Notes (Signed)
Subjective:    CC: Abormal weight gain  HPI:   Abnormal weight gain-currently on phentermine 37.5 mg daily. Here for one-month weight check. Is actually getting married soon and so would like to work on weight loss.He is also gained about 30 pounds over the last 2 years and really wants to get this back off. She says she really only started taking the medication consistently about 4 days ago that she like to continue on the medication for a little longer to see how she does. She is taking it previously and tolerated it well. She has not had any chest pain, short of breath, palpitations or insomnia with the medication. She does have a history of chronic insomnia but she does not feel like it has been worsened by the recent use of phentermine. She says she has started to change her diet. She actually has gone back to using her bariatric diet that she was previously on on by increasing her vegetable intake as well as proteins and really minimizing sugars and carbs. She has not started a specific exercise routine yet but plans to.  She also wanted to update me about her migraine headaches. She was getting them more frequently about 3 per week and then having daily low-grade headache. She did go see her migraine specialist and they have now put her on tip. May and she is working on a taper. She's also been placed on magnesium and riboflavin.  Past medical history, Surgical history, Family history not pertinant except as noted below, Social history, Allergies, and medications have been entered into the medical record, reviewed, and corrections made.   Review of Systems: No fevers, chills, night sweats, weight loss, chest pain, or shortness of breath.   Objective:    General: Well Developed, well nourished, and in no acute distress.  Neuro: Alert and oriented x3, extra-ocular muscles intact, sensation grossly intact.  HEENT: Normocephalic, atraumatic  Skin: Warm and dry, no rashes. Cardiac: Regular  rate and rhythm, no murmurs rubs or gallops, no lower extremity edema.  Respiratory: Clear to auscultation bilaterally. Not using accessory muscles, speaking in full sentences.   Impression and Recommendations:    Abnormal weight gain/ BMI 33-  we'll continue phentermine. Her blood pressure and pulse look great today but she did not actually take medications today. I did write for 1 more refill but asked her to make sure that she actually takes the medication on the day that she comes in for a blood pressure and pulse check. Did encourage her to set and exercise routine. It sounds like she is Artie made some fantastic dietary changes so encouraged her to keep that up.  Migraine headaches-improving with changes and topiramate.

## 2016-10-21 ENCOUNTER — Encounter: Payer: Self-pay | Admitting: Emergency Medicine

## 2016-10-21 ENCOUNTER — Emergency Department (INDEPENDENT_AMBULATORY_CARE_PROVIDER_SITE_OTHER)
Admission: EM | Admit: 2016-10-21 | Discharge: 2016-10-21 | Disposition: A | Discharge status: Home / Self Care | Payer: Managed Care, Other (non HMO) | Source: Home / Self Care | Attending: Family Medicine | Admitting: Family Medicine

## 2016-10-21 DIAGNOSIS — J209 Acute bronchitis, unspecified: Secondary | ICD-10-CM

## 2016-10-21 DIAGNOSIS — H6691 Otitis media, unspecified, right ear: Secondary | ICD-10-CM

## 2016-10-21 MED ORDER — ALBUTEROL SULFATE HFA 108 (90 BASE) MCG/ACT IN AERS: 1.0000 | INHALATION_SPRAY | Freq: Four times a day (QID) | RESPIRATORY_TRACT | 0 refills | Status: DC | PRN

## 2016-10-21 MED ORDER — BENZONATATE 100 MG PO CAPS: 100.0000 mg | ORAL_CAPSULE | Freq: Three times a day (TID) | ORAL | 0 refills | Status: DC

## 2016-10-21 MED ORDER — AMOXICILLIN-POT CLAVULANATE 875-125 MG PO TABS: 1.0000 | ORAL_TABLET | Freq: Two times a day (BID) | ORAL | 0 refills | Status: DC

## 2016-10-21 NOTE — ED Provider Notes (Signed)
CSN: [id number]     Arrival date & time 10/21/16  1810 History   First MD Initiated Contact with Patient 10/21/16 1834     Chief Complaint  Patient presents with  . Cough  . Nasal Congestion  . Headache  . Sore Throat   (Consider location/radiation/quality/duration/timing/severity/associated sxs/prior Treatment) HPI Rose Long is a 56 y.o. female presenting to UC with c/o 1 week of gradually worsening moderately productive cough, congestion, sore throat, neck pain and generalized headache.  Worse over the last few days.  Her fiance has been sick recently as well. Denies n/v/d. No known fever.    Past Medical History:  Diagnosis Date  . Anemia   . Arthritis   . Complication of anesthesia    PONV  . Family history of adverse reaction to anesthesia    pt's mother stopped breathing possibly due to sleep apnea  . Migraine   . Obesity   . Simple ovarian cyst    Right   Past Surgical History:  Procedure Laterality Date  . ABDOMINOPLASTY/PANNICULECTOMY    . BREAST SURGERY  1998   reduction  . BREAST SURGERY  10/2013   Lift, tummy tuck  . NASAL SINUS SURGERY  1999  . 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 under arm skin removed    . uterine ablation  2012   Family History  Problem Relation Age of Onset  . Hypertension Mother   . Bladder Cancer Mother   . Hyperlipidemia Mother   . Migraines Mother   . Arthritis Mother   . Obesity Mother   . Hypertension Father   . CVA Father   . Heart failure Father   . Arthritis Father   . Hyperlipidemia Father   . Kidney failure Father   . Heart disease Maternal Grandfather   . Lung cancer Paternal Grandmother     lung  . Alcohol abuse Paternal Uncle   . Diabetes Cousin   . Arthritis Sister   . Hypertension Brother   . Arthritis Brother   . Obesity Brother     Social History  Substance Use Topics  . Smoking status: Never Smoker  . Smokeless tobacco: Never Used  . Alcohol use 0.6 oz/week    1 Standard drinks or equivalent per week     Comment: socially   OB History    Gravida Para Term Preterm AB Living   0 0 0 0 0 0   SAB TAB Ectopic Multiple Live Births   0 0 0 0       Review of Systems  Constitutional: Negative for chills and fever.  HENT: Positive for congestion, postnasal drip, rhinorrhea, sinus pressure and sore throat. Negative for ear pain, sinus pain, trouble swallowing and voice change.   Respiratory: Positive for cough. Negative for shortness of breath.   Cardiovascular: Negative for chest pain and palpitations.  Gastrointestinal: Negative for abdominal pain, diarrhea, nausea and vomiting.  Musculoskeletal: Positive for arthralgias and myalgias. Negative for back pain.       Body aches  Skin: Negative for rash.  Neurological: Positive for headaches. Negative for dizziness and light-headedness.    Allergies  Sulfa antibiotics; Sulfamethoxazole-trimethoprim; and Bee venom  Home Medications   Prior to Admission medications   Medication Sig Start Date End Date Taking? Authorizing Provider  albuterol (PROVENTIL HFA;VENTOLIN HFA) 108 (  90 Base) MCG/ACT inhaler Inhale 1-2 puffs into the lungs every 6 (six) hours as needed for wheezing or shortness of breath. 10/21/16   Noland Fordyce, PA-C  amoxicillin-clavulanate (AUGMENTIN) 875-125 MG tablet Take 1 tablet by mouth 2 (two) times daily. One po bid x 7 days 10/21/16   Noland Fordyce, PA-C  benzonatate (TESSALON) 100 MG capsule Take 1-2 capsules (100-200 mg total) by mouth every 8 (eight) hours. 10/21/16   Noland Fordyce, PA-C  calcium citrate-vitamin D 200-200 MG-UNIT TABS Take 1 tablet by mouth daily.      Historical Provider, MD  Cholecalciferol (VITAMIN D3) 5000 units TABS Take 1 tablet (5,000 Units total) by mouth 2 (two) times daily. 02/16/16   Hali Marry, MD  ferrous  sulfate 325 (65 FE) MG tablet Take 325 mg by mouth daily with breakfast.    Historical Provider, MD  Iron-FA-B Cmp-C-Biot-Probiotic (FUSION PLUS) CAPS Take 1 capsule by mouth daily. 08/09/16   Hali Marry, MD  MAGNESIUM CITRATE PO Take 1 tablet by mouth 2 (two) times daily.     Historical Provider, MD  Multiple Vitamin (MULTIVITAMIN) tablet Take 1 tablet by mouth daily.      Historical Provider, MD  phentermine (ADIPEX-P) 37.5 MG tablet One tab by mouth qAM 10/06/16   Hali Marry, MD  Riboflavin 400 MG TABS Take 1 tablet by mouth daily.    Historical Provider, MD  SUMAtriptan (IMITREX) 100 MG tablet TAKE ONE TABLET BY MOUTH ONCE AS NEEDED FOR MIGRAINE 90 day supply for mail order Patient taking differently: 50 mg. TAKE ONE TABLET BY MOUTH ONCE AS NEEDED FOR MIGRAINE 90 day supply for mail order 08/09/16   Hali Marry, MD  topiramate (TOPAMAX) 25 MG tablet Take one tablet at night for one week, then take 2 tablets at night for one week, then take 3 tablets at night. 09/29/16   Kathrynn Ducking, MD   Meds Ordered and Administered this Visit  Medications - No data to display  BP 108/77 (BP Location: Left Arm)   Temp 98.9 F (37.2 C) (Oral)   Resp 18   Ht 5' 6.5" (1.689 m)   Wt 213 lb (96.6 kg)   BMI 33.86 kg/m  No data found.   Physical Exam  Constitutional: She is oriented to person, place, and time. She appears well-developed and well-nourished. No distress.  HENT:  Head: Normocephalic and atraumatic.  Right Ear: Tympanic membrane is erythematous and bulging.  Left Ear: Tympanic membrane normal.  Nose: Mucosal edema present. Right sinus exhibits no maxillary sinus tenderness and no frontal sinus tenderness. Left sinus exhibits no maxillary sinus tenderness and no frontal sinus tenderness.  Mouth/Throat: Uvula is midline and mucous membranes are normal. Posterior oropharyngeal erythema present. No oropharyngeal exudate, posterior oropharyngeal edema or  tonsillar abscesses.  Eyes: EOM are normal.  Neck: Normal range of motion. Neck supple.  Cardiovascular: Normal rate and regular rhythm.   Pulmonary/Chest: Effort normal. No stridor. No respiratory distress. She has wheezes ( faint expiratory). She has rhonchi ( faint diffuse). She has no rales.  Musculoskeletal: Normal range of motion.  Lymphadenopathy:    She has cervical adenopathy.  Neurological: She is alert and oriented to person, place, and time.  Skin: Skin is warm and dry. She is not diaphoretic.  Psychiatric: She has a normal mood and affect. Her behavior is normal.  Nursing note and vitals reviewed.   Urgent Care Course   Clinical Course     Procedures (including critical  care time)  Labs Review Labs Reviewed - No data to display  Imaging Review No results found.    MDM   1. Acute bronchitis, unspecified organism   2. Right acute otitis media    Pt presenting with worsening URI symptoms for at least 1 week.  Diffuse wheeze and rhonchi noted on exam. Exam also c/w Right AOM  Rx: albuterol inhaler, Augmentin and tessalon F/u with PCP in 1 week if not improving.     Noland Fordyce, PA-C 10/21/16 1914

## 2016-10-21 NOTE — ED Triage Notes (Signed)
Patient reports congestion, cough, headache, sore throat and neck pain worsening over past one week.

## 2016-11-11 ENCOUNTER — Other Ambulatory Visit: Payer: Self-pay | Admitting: Family Medicine

## 2016-11-11 DIAGNOSIS — E669 Obesity, unspecified: Secondary | ICD-10-CM

## 2016-11-18 ENCOUNTER — Ambulatory Visit (INDEPENDENT_AMBULATORY_CARE_PROVIDER_SITE_OTHER): Payer: Managed Care, Other (non HMO) | Admitting: Family Medicine

## 2016-11-18 DIAGNOSIS — E669 Obesity, unspecified: Secondary | ICD-10-CM

## 2016-11-18 MED ORDER — PHENTERMINE HCL 37.5 MG PO TABS: ORAL_TABLET | ORAL | 0 refills | Status: DC

## 2016-11-18 NOTE — Progress Notes (Signed)
   Subjective:    Patient ID: Rose Long, female    DOB: April 29, 1961, 56 y.o.   MRN: [id number]  HPI Pt is here for blood pressure check and weight check. Pt denies palpitations, medication problems or chest pain.   Review of Systems     Objective:   Physical Exam        Assessment & Plan:  Pt has lost weight. A refill for phentermine will be faxed to pharmacy. Patient advised to schedule a follow-up with nurse/provider in 30 days.

## 2016-11-19 NOTE — Progress Notes (Signed)
   Subjective:    Patient ID: Rose Long, female    DOB: 02-02-1961, 56 y.o.   MRN: [id number]  HPI    Review of Systems     Objective:   Physical Exam        Assessment & Plan:  Agree with below.  Beatrice Lecher, MD

## 2016-12-28 ENCOUNTER — Encounter: Payer: Self-pay | Admitting: Adult Health

## 2016-12-28 ENCOUNTER — Ambulatory Visit (INDEPENDENT_AMBULATORY_CARE_PROVIDER_SITE_OTHER): Payer: Managed Care, Other (non HMO) | Admitting: Adult Health

## 2016-12-28 VITALS — BP 122/74 | HR 66 | Ht 67.0 in | Wt 222.6 lb

## 2016-12-28 DIAGNOSIS — R400 Somnolence: Secondary | ICD-10-CM | POA: Diagnosis not present

## 2016-12-28 DIAGNOSIS — R0683 Snoring: Secondary | ICD-10-CM

## 2016-12-28 DIAGNOSIS — G43019 Migraine without aura, intractable, without status migrainosus: Secondary | ICD-10-CM

## 2016-12-28 MED ORDER — TOPIRAMATE 100 MG PO TABS: 100.0000 mg | ORAL_TABLET | Freq: Every day | ORAL | 4 refills | Status: DC

## 2016-12-28 NOTE — Progress Notes (Signed)
I have read the note, and I agree with the clinical assessment and plan.  WILLIS,CHARLES KEITH   

## 2016-12-28 NOTE — Patient Instructions (Signed)
Increase Topamax 100 mg at bedtime If your symptoms worsen or you develop new symptoms please let us know.

## 2016-12-28 NOTE — Progress Notes (Signed)
PATIENT: Rose Long DOB: September 14, 1961  REASON FOR VISIT: follow up- migraine headache HISTORY FROM: patient  HISTORY OF PRESENT ILLNESS: Rose Long is a 56 year old female with a history of migraine headaches. She returns today for follow-up. She states that her headache frequency and severity has improved slightly with Topamax. She has approximately 3-4 headaches a week. Her headaches always occur in the top of her head. She reports that she always has photophobia. Denies phonophobia. Denies nausea or vomiting. She states that she always wakes up with a dull headache. She does not think she snores consistently but reports that her fianc reports that he has noticed this occasionally. She reports that she always has daytime sleepiness. She had a sleep study approximately 5 years ago that was relatively unremarkable. She states that she takes approximately 150 mg of Benadryl nightly to help her sleep. Reports that her primary care is aware of this. She returns today for an evaluation.  HISTORY 09/29/16: Rose Long is a 56 year old left-handed white female with a history of migraine headaches since age 63. The patient claims that throughout her entire life her headaches have been daily in nature, with severe headaches occurring less frequently. The patient has noted a recent increase in the frequency of the severe headaches occurring 2 or 3 times a week now. The headaches are often times bifrontal and in the vertex of the head, occasionally in the occipital area. The patient may have some nausea, but she does not vomit with the headache. She does report photophobia and phonophobia. She may feel somewhat spacey when the headache is present. She oftentimes will wake up with the headache, and she reports a lifelong problem with insomnia. The patient is not sure of any particular activating factors for the headache. She denies any numbness or weakness of the face, arms, or legs associated with the  headache. She does have some occasional numbness in the hands and feet. She denies any severe balance problems but she is slightly off balance. She denies issues controlling the bowels or the bladder. Her mother also has migraine headaches. She does not take in caffeinated products to excess. In the past, she has been tried on Ultram, Flexeril, Fioricet, and she currently is on Imitrex. She believes that she may have been on Topamax in the distant past, but he has been greater than 15 years since this was used. She does not recall if it was effective. The patient denies any significant neck stiffness with headache. She is referred to this office for an evaluation. A recent MRI of the brain was done and was unremarkable.   REVIEW OF SYSTEMS: Out of a complete 14 system review of symptoms, the patient complains only of the following symptoms, and all other reviewed systems are negative.  Headache, numbness, joint pain, joint swelling, aching muscles, muscle cramps, frequent waking, daytime sleepiness, insomnia, restless leg, constipation, bruise easily, anemia, fatigue  ALLERGIES: Allergies  Allergen Reactions  . Sulfa Antibiotics Anaphylaxis  . Sulfamethoxazole-Trimethoprim Anaphylaxis, Swelling and Rash    Redness and swelling  . Bee Venom     HOME MEDICATIONS: Outpatient Medications Prior to Visit  Medication Sig Dispense Refill  . albuterol (PROVENTIL HFA;VENTOLIN HFA) 108 (90 Base) MCG/ACT inhaler Inhale 1-2 puffs into the lungs every 6 (six) hours as needed for wheezing or shortness of breath. 1 Inhaler 0  . calcium citrate-vitamin D 200-200 MG-UNIT TABS Take 1 tablet by mouth daily.      . Cholecalciferol (VITAMIN D3) 5000  units TABS Take 1 tablet (5,000 Units total) by mouth 2 (two) times daily. 90 tablet 1  . ferrous sulfate 325 (65 FE) MG tablet Take 325 mg by mouth daily with breakfast.    . Iron-FA-B Cmp-C-Biot-Probiotic (FUSION PLUS) CAPS Take 1 capsule by mouth daily. 90 capsule 1   . MAGNESIUM CITRATE PO Take 1 tablet by mouth 2 (two) times daily.     . Multiple Vitamin (MULTIVITAMIN) tablet Take 1 tablet by mouth daily.      . phentermine (ADIPEX-P) 37.5 MG tablet One tab by mouth qAM 30 tablet 0  . Riboflavin 400 MG TABS Take 1 tablet by mouth daily.    . SUMAtriptan (IMITREX) 100 MG tablet TAKE ONE TABLET BY MOUTH ONCE AS NEEDED FOR MIGRAINE 90 day supply for mail order (Patient taking differently: 50 mg. TAKE ONE TABLET BY MOUTH ONCE AS NEEDED FOR MIGRAINE 90 day supply for mail order) 9 tablet 4  . topiramate (TOPAMAX) 25 MG tablet Take one tablet at night for one week, then take 2 tablets at night for one week, then take 3 tablets at night. 90 tablet 3   No facility-administered medications prior to visit.     PAST MEDICAL HISTORY: Past Medical History:  Diagnosis Date  . Anemia   . Arthritis   . Complication of anesthesia    PONV  . Family history of adverse reaction to anesthesia    pt's mother stopped breathing possibly due to sleep apnea  . Migraine   . Obesity   . Simple ovarian cyst    Right    PAST SURGICAL HISTORY: Past Surgical History:  Procedure Laterality Date  . ABDOMINOPLASTY/PANNICULECTOMY    . BREAST SURGERY  1998   reduction  . BREAST SURGERY  10/2013   Lift, tummy tuck  . NASAL SINUS SURGERY  1999  . 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 under arm skin removed    . uterine ablation  2012    FAMILY HISTORY: Family History  Problem Relation Age of Onset  . Hypertension Mother   . Bladder Cancer Mother   . Hyperlipidemia Mother   . Migraines Mother   . Arthritis Mother   . Obesity Mother   . Hypertension Father   . CVA Father   . Heart failure Father   . Arthritis Father   . Hyperlipidemia Father   . Kidney failure Father   . Heart disease Maternal  Grandfather   . Lung cancer Paternal Grandmother     lung  . Alcohol abuse Paternal Uncle   . Diabetes Cousin   . Arthritis Sister   . Hypertension Brother   . Arthritis Brother   . Obesity Brother     SOCIAL HISTORY: Social History   Social History  . Marital status: Single    Spouse name: N/A  . Number of children: 0  . Years of education: Masters   Occupational History  . 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 Smoking status: Never Smoker  . Smokeless tobacco: Never Used  . Alcohol use 0.6 oz/week    1 Standard drinks or equivalent per week     Comment: socially  . Drug use: No  . Sexual activity: Not Currently   Other Topics Concern  . Not  on file   Social History Narrative   Parents moved into her home on June 2013.  2 caffeine drinks per day.  Works out 1 hour 5-6 days per week.    Left-handed      PHYSICAL EXAM  Vitals:   12/28/16 1341  BP: 122/74  Pulse: 66  Weight: 222 lb 9.6 oz (101 kg)  Height: 5\' 7"  (1.702 m)   Body mass index is 34.86 kg/m.  Generalized: Well developed, in no acute distress   Neurological examination  Mentation: Alert oriented to time, place, history taking. Follows all commands speech and language fluent Cranial nerve II-XII: Pupils were equal round reactive to light. Extraocular movements were full, visual field were full on confrontational test. Facial sensation and strength were normal. Uvula tongue midline. Head turning and shoulder shrug  were normal and symmetric. Motor: The motor testing reveals 5 over 5 strength of all 4 extremities. Good symmetric motor tone is noted throughout.  Sensory: Sensory testing is intact to soft touch on all 4 extremities. No evidence of extinction is noted.  Coordination: Cerebellar testing reveals good finger-nose-finger and heel-to-shin bilaterally.  Gait and station: Gait is normal. Tandem gait is normal. Romberg is negative.  No drift is seen.  Reflexes: Deep tendon reflexes are symmetric and normal bilaterally.   DIAGNOSTIC DATA (LABS, IMAGING, TESTING) - I reviewed patient records, labs, notes, testing and imaging myself where available.  Lab Results  Component Value Date   WBC 8.3 09/19/2015   HGB 12.9 07/19/2016   HCT 39.2 09/19/2015   MCV 84.3 09/19/2015   PLT 324 09/19/2015      Component Value Date/Time   NA 139 08/26/2016 1545   K 4.0 08/26/2016 1545   CL 104 08/26/2016 1545   CO2 27 08/26/2016 1545   GLUCOSE 84 08/26/2016 1545   BUN 10 08/26/2016 1545   CREATININE 0.61 08/26/2016 1545   CALCIUM 9.1 08/26/2016 1545   PROT 6.7 06/02/2015 1053   ALBUMIN 3.6 06/02/2015 1053   AST 23 06/02/2015 1053   ALT 24 06/02/2015 1053   ALKPHOS 70 06/02/2015 1053   BILITOT 0.4 06/02/2015 1053   GFRNONAA >60 09/19/2015 1532   GFRNONAA >89 06/02/2015 1053   GFRAA >60 09/19/2015 1532   GFRAA >89 06/02/2015 1053   Lab Results  Component Value Date   CHOL 161 08/05/2014   HDL 60 08/05/2014   LDLCALC 86 08/05/2014   TRIG 76 08/05/2014   CHOLHDL 2.7 08/05/2014   No results found for: HGBA1C Lab Results  Component Value Date   VITAMINB12 481 06/02/2015   Lab Results  Component Value Date   TSH 2.311 11/29/2012      ASSESSMENT AND PLAN 56 y.o. year old female  has a past medical history of Anemia; Arthritis; Complication of anesthesia; Family history of adverse reaction to anesthesia; Migraine; Obesity; and Simple ovarian cyst. here with:  1. Migraine headaches 2. Daytime sleepiness 3. Snoring  The patient continues to have 3-4 headaches a week. I will increase Topamax 200 mg at bedtime. In the future we may consider repeating sleep study as her BMI has increased, she does report daytime sleepiness, early morning headache and snoring. Patient is amenable to this plan. She will follow-up in 3 months or sooner if needed.     Ward Givens, MSN, NP-C 12/28/2016, 1:46 PM [facility] [address], [place], [place] [address] [phone]

## 2017-03-31 ENCOUNTER — Ambulatory Visit (INDEPENDENT_AMBULATORY_CARE_PROVIDER_SITE_OTHER): Payer: Managed Care, Other (non HMO) | Admitting: Adult Health

## 2017-03-31 ENCOUNTER — Encounter: Payer: Self-pay | Admitting: Adult Health

## 2017-03-31 VITALS — BP 131/76 | HR 86 | Ht 67.0 in | Wt 217.6 lb

## 2017-03-31 DIAGNOSIS — G43009 Migraine without aura, not intractable, without status migrainosus: Secondary | ICD-10-CM | POA: Diagnosis not present

## 2017-03-31 NOTE — Progress Notes (Signed)
I have read the note, and I agree with the clinical assessment and plan.  Rubin Dais KEITH   

## 2017-03-31 NOTE — Progress Notes (Signed)
PATIENT: Rose Long DOB: 21-Aug-1961  REASON FOR VISIT: follow up- migraine HISTORY FROM: patient  HISTORY OF PRESENT ILLNESS: Rose Long is a 56 year old female with a history of migraine headaches. She returns today for follow-up. She reports that since we increased her Topamax to 100 mg daily her headache frequency has improved. She has approximately 1 headache a week. Her headaches still occurs on the top of the head. She does have photophobia but denies phonophobia and nausea and vomiting. She states that she takes half a tablet of Imitrex her headache typically resolves fairly quickly. She is currently satisfied with her response to Topamax. She returns today for an evaluation.  HISTORY 12/28/16: Rose Long is a 56 year old female with a history of migraine headaches. She returns today for follow-up. She states that her headache frequency and severity has improved slightly with Topamax. She has approximately 3-4 headaches a week. Her headaches always occur in the top of her head. She reports that she always has photophobia. Denies phonophobia. Denies nausea or vomiting. She states that she always wakes up with a dull headache. She does not think she snores consistently but reports that her fianc reports that he has noticed this occasionally. She reports that she always has daytime sleepiness. She had a sleep study approximately 5 years ago that was relatively unremarkable. She states that she takes approximately 150 mg of Benadryl nightly to help her sleep. Reports that her primary care is aware of this. She returns today for an evaluation.  REVIEW OF SYSTEMS: Out of a complete 14 system review of symptoms, the patient complains only of the following symptoms, and all other reviewed systems are negative.  Fatigue, eye itching, frequent waking, daytime sleepiness, insomnia, restless leg, constipation, dizziness, headache, numbness, anemia, environmental  allergies  ALLERGIES: Allergies  Allergen Reactions  . Sulfa Antibiotics Anaphylaxis  . Sulfamethoxazole-Trimethoprim Anaphylaxis, Swelling and Rash    Redness and swelling  . Bee Venom     HOME MEDICATIONS: Outpatient Medications Prior to Visit  Medication Sig Dispense Refill  . albuterol (PROVENTIL HFA;VENTOLIN HFA) 108 (90 Base) MCG/ACT inhaler Inhale 1-2 puffs into the lungs every 6 (six) hours as needed for wheezing or shortness of breath. 1 Inhaler 0  . calcium citrate-vitamin D 200-200 MG-UNIT TABS Take 1 tablet by mouth daily.      . Cholecalciferol (VITAMIN D3) 5000 units TABS Take 1 tablet (5,000 Units total) by mouth 2 (two) times daily. 90 tablet 1  . ferrous sulfate 325 (65 FE) MG tablet Take 325 mg by mouth daily with breakfast.    . Iron-FA-B Cmp-C-Biot-Probiotic (FUSION PLUS) CAPS Take 1 capsule by mouth daily. 90 capsule 1  . MAGNESIUM CITRATE PO Take 1 tablet by mouth 2 (two) times daily.     . Multiple Vitamin (MULTIVITAMIN) tablet Take 1 tablet by mouth daily.      . phentermine (ADIPEX-P) 37.5 MG tablet One tab by mouth qAM 30 tablet 0  . SUMAtriptan (IMITREX) 100 MG tablet TAKE ONE TABLET BY MOUTH ONCE AS NEEDED FOR MIGRAINE 90 day supply for mail order (Patient taking differently: 50 mg. TAKE ONE TABLET BY MOUTH ONCE AS NEEDED FOR MIGRAINE 90 day supply for mail order) 9 tablet 4  . topiramate (TOPAMAX) 100 MG tablet Take 1 tablet (100 mg total) by mouth at bedtime. 90 tablet 4  . Riboflavin 400 MG TABS Take 1 tablet by mouth daily.     No facility-administered medications prior to visit.  PAST MEDICAL HISTORY: Past Medical History:  Diagnosis Date  . Anemia   . Arthritis   . Complication of anesthesia    PONV  . Family history of adverse reaction to anesthesia    pt's mother stopped breathing possibly due to sleep apnea  . Migraine   . Obesity   . Simple ovarian cyst    Right    PAST SURGICAL HISTORY: Past Surgical History:  Procedure  Laterality Date  . ABDOMINOPLASTY/PANNICULECTOMY    . BREAST SURGERY  1998   reduction  . BREAST SURGERY  10/2013   Lift, tummy tuck  . NASAL SINUS SURGERY  1999  . 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 under arm skin removed    . uterine ablation  2012    FAMILY HISTORY: Family History  Problem Relation Age of Onset  . Hypertension Mother   . Bladder Cancer Mother   . Hyperlipidemia Mother   . Migraines Mother   . Arthritis Mother   . Obesity Mother   . Hypertension Father   . CVA Father   . Heart failure Father   . Arthritis Father   . Hyperlipidemia Father   . Kidney failure Father   . Heart disease Maternal Grandfather   . Lung cancer Paternal Grandmother        lung  . Alcohol abuse Paternal Uncle   . Diabetes Cousin   . Arthritis Sister   . Hypertension Brother   . Arthritis Brother   . Obesity Brother     SOCIAL HISTORY: Social History   Social History  . Marital status: Single    Spouse name: N/A  . Number of children: 0  . Years of education: Masters   Occupational History  . [employer]    Fraud investigator   Social History Main Topics  . Smoking status: Never Smoker  . Smokeless tobacco: Never Used  . Alcohol use 0.6 oz/week    1 Standard drinks or equivalent per week     Comment: socially  . Drug use: No  . Sexual activity: Not Currently   Other Topics Concern  . Not on file   Social History Narrative   Parents moved into her home on June 2013.  2 caffeine drinks per day.  Works out 1 hour 5-6 days per week.    Left-handed      PHYSICAL EXAM  Vitals:   03/31/17 1403  BP: 131/76  Pulse: 86  Weight: 217 lb 9.6 oz (98.7 kg)  Height: 5\' 7"  (1.702 m)   Body mass index is 34.08 kg/m.  Generalized: Well developed, in no acute distress    Neurological examination  Mentation: Alert oriented to time, place, history taking. Follows all commands speech and language fluent Cranial nerve II-XII: Pupils were equal round reactive to light. Extraocular movements were full, visual field were full on confrontational test. Facial sensation and strength were normal. Uvula tongue midline. Head turning and shoulder shrug  were normal and symmetric. Motor: The motor testing reveals 5 over 5 strength of all 4 extremities. Good symmetric motor tone is noted throughout.  Sensory: Sensory testing is intact to soft touch on all 4 extremities. No evidence of extinction is noted.  Coordination: Cerebellar testing reveals good finger-nose-finger and heel-to-shin bilaterally.  Gait and station: Gait is normal. Tandem gait  is normal. Romberg is negative. No drift is seen.  Reflexes: Deep tendon reflexes are symmetric and normal bilaterally.   DIAGNOSTIC DATA (LABS, IMAGING, TESTING) - I reviewed patient records, labs, notes, testing and imaging myself where available.  Lab Results  Component Value Date   WBC 8.3 09/19/2015   HGB 12.9 07/19/2016   HCT 39.2 09/19/2015   MCV 84.3 09/19/2015   PLT 324 09/19/2015      Component Value Date/Time   NA 139 08/26/2016 1545   K 4.0 08/26/2016 1545   CL 104 08/26/2016 1545   CO2 27 08/26/2016 1545   GLUCOSE 84 08/26/2016 1545   BUN 10 08/26/2016 1545   CREATININE 0.61 08/26/2016 1545   CALCIUM 9.1 08/26/2016 1545   PROT 6.7 06/02/2015 1053   ALBUMIN 3.6 06/02/2015 1053   AST 23 06/02/2015 1053   ALT 24 06/02/2015 1053   ALKPHOS 70 06/02/2015 1053   BILITOT 0.4 06/02/2015 1053   GFRNONAA >60 09/19/2015 1532   GFRNONAA >89 06/02/2015 1053   GFRAA >60 09/19/2015 1532   GFRAA >89 06/02/2015 1053   Lab Results  Component Value Date   CHOL 161 08/05/2014   HDL 60 08/05/2014   LDLCALC 86 08/05/2014   TRIG 76 08/05/2014   CHOLHDL 2.7 08/05/2014   No results found for: HGBA1C Lab Results   Component Value Date   VITAMINB12 481 06/02/2015   Lab Results  Component Value Date   TSH 2.311 11/29/2012      ASSESSMENT AND PLAN 56 y.o. year old female  has a past medical history of Anemia; Arthritis; Complication of anesthesia; Family history of adverse reaction to anesthesia; Migraine; Obesity; and Simple ovarian cyst. here with:  1. Migraine headaches  Overall the patient is doing well. She will continue on Topamax 100 mg at bedtime. She is advised that if her headache frequency increases she should let us know. She will follow-up in 6 months or sooner if needed.  I spent 15 minutes with the patient. 50% of this time was spent discussing her medication regimen and the possibility of increasing Topamax in the future.      Ward Givens, MSN, NP-C 03/31/2017, 2:13 PM [facility] [address] [place], [place] [address] [phone]

## 2017-03-31 NOTE — Patient Instructions (Signed)
Continue Topamax 100 mg  If your symptoms worsen or you develop new symptoms please let us know.

## 2017-05-06 ENCOUNTER — Encounter: Payer: Self-pay | Admitting: Rehabilitative and Restorative Service Providers"

## 2017-05-06 ENCOUNTER — Ambulatory Visit (INDEPENDENT_AMBULATORY_CARE_PROVIDER_SITE_OTHER): Payer: Managed Care, Other (non HMO) | Admitting: Rehabilitative and Restorative Service Providers"

## 2017-05-06 DIAGNOSIS — M25561 Pain in right knee: Secondary | ICD-10-CM | POA: Diagnosis not present

## 2017-05-06 DIAGNOSIS — R531 Weakness: Secondary | ICD-10-CM | POA: Diagnosis not present

## 2017-05-06 DIAGNOSIS — R269 Unspecified abnormalities of gait and mobility: Secondary | ICD-10-CM

## 2017-05-06 NOTE — Patient Instructions (Signed)
Standing with weight equal    HIP: Hamstrings - Supine   Place strap around foot. Raise leg up, keeping knee straight.  Bend opposite knee to protect back if indicated. Hold 30 seconds. 3 reps per set, 2-3 sets per day   Outer Hip Stretch: Reclined IT Band Stretch (Strap)   Strap around one foot, pull leg across body until you feel a pull or stretch, with shoulders on mat. Hold for 30 seconds. Repeat 3 times each leg. 2-3 times/day.  As above - bring leg out to the side stretching inner thigh   Quads / HF, Prone   Lie face down. Grasp one ankle with same-side hand. Use towel if needed to reach. Gently pull foot toward buttock.  Hold 30 seconds. Repeat 3 times per session. Do 2-3 sessions per day.  Gluteal Sets    Tighten buttocks while pressing pelvis to floor. Hold __10__ seconds. Repeat __10__ times per set. Do _1-2_ sets per session. Do __2__ sessions per day.  Quad Set    With other leg bent, foot flat, slowly tighten muscles on thigh of straight leg while counting out loud to _10___. Repeat with other leg. Repeat __10__ times. Do __2__ sessions per day.   HIP: Flexion / KNEE: Extension, Straight Leg Raise    Raise leg, keeping knee straight. Perform slowly. _10__ reps per set, __1-2_ sets per day, __2 times/days

## 2017-05-06 NOTE — Therapy (Signed)
[place], [place], [address] Phone: [phone]   Fax:  [phone]  Physical Therapy Evaluation  Patient Details  Name: Rose Long MRN: [id number] Date of Birth: [birth year] Referring Provider: Dr Melrose Nakayama   Encounter Date: 05/06/2017      PT End of Session - 05/06/17 0807    Visit Number 1   Number of Visits 12   Date for PT Re-Evaluation 06/17/17   PT Start Time 0804   PT Stop Time 0900   PT Time Calculation (min) 56 min   Activity Tolerance Patient tolerated treatment well      Past Medical History:  Diagnosis Date  . Anemia   . Arthritis   . Complication of anesthesia    PONV  . Family history of adverse reaction to anesthesia    pt's mother stopped breathing possibly due to sleep apnea  . Migraine   . Obesity   . Simple ovarian cyst    Right    Past Surgical History:  Procedure Laterality Date  . ABDOMINOPLASTY/PANNICULECTOMY    . BREAST SURGERY  1998   reduction  . BREAST SURGERY  10/2013   Lift, tummy tuck  . NASAL SINUS SURGERY  1999  . 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 TOTAL KNEE ARTHROPLASTY Left 09/30/2015   Procedure: TOTAL KNEE ARTHROPLASTY;  Surgeon: Melrose Nakayama, MD;  Location: [facility];  Service: Orthopedics;  Laterality: Left;  . under arm skin removed    . uterine ablation  2012    There were no vitals filed for this visit.       Subjective Assessment - 05/06/17 0809    Subjective Patient reports that she was walking up the steps 04/30/17 when she her her Rt knee pop and felt pain. She was seen in the ED with xrays (-). She was treated with pain meds without relief. Pain has changed - less intense. She has a tooth ache pain but when she is moving or changing positions the pain increases.    Pertinent History Lt TKA 2017    How long can you sit comfortably? 1 hour    How long can you stand comfortably? unsure - better standing than sitting    How  long can you walk comfortably? unsure    Diagnostic tests xrays   Patient Stated Goals get rid of the pain and and to be able to walk down the asile in 3 weeks with normal gait and no assistive device    Currently in Pain? Yes   Pain Score 3   8/10 with movement    Pain Location Knee   Pain Orientation Right   Pain Descriptors / Indicators Aching;Dull;Sharp  sharp with certain movements    Pain Type Acute pain   Pain Radiating Towards into thigh(posteriorly) and calf(lateral/posteriorly)   Pain Onset 1 to 4 weeks ago   Pain Frequency Constant   Aggravating Factors  moving sit to stand; changing positions; moving   Pain Relieving Factors not moving; ice             [facility] PT Assessment - 05/06/17 0001      Assessment   Medical Diagnosis Lt knee strain    Referring Provider Dr Melrose Nakayama    Onset Date/Surgical Date 04/30/17   Hand Dominance Left   Next MD Visit 05/23/17   Prior Therapy yes for Lt knee      Precautions   Precautions None  Restrictions   Weight Bearing Restrictions No     Balance Screen   Has the patient fallen in the past 6 months No   Has the patient had a decrease in activity level because of a fear of falling?  No   Is the patient reluctant to leave their home because of a fear of falling?  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    Leisure household chores otherwise sedentary      Observation/Other Assessments   Focus on Therapeutic Outcomes (FOTO)  66% limitation      Sensation   Additional Comments WFL's per pt report      Posture/Postural Control   Posture Comments head forward; shoudlers rounded and elevated; flexed forward at hips; wt shifted to the Lt      AROM   Right/Left Hip --  hips WFLs except hip ext  bilat/decreased rotation Rt   Right Knee Extension -4   Right Knee Flexion 106   Left Knee Extension 0   Left Knee Flexion 100     Strength   Strength Assessment Site --  pain with resistive testing Rt knee ext    Right/Left Hip --  Lt hip 5/5   Right Hip Flexion 4+/5   Right Hip Extension 4/5   Right Hip ABduction 4/5   Right/Left Knee --  Lt knee 5/5    Right Knee Flexion 4-/5   Right Knee Extension 4/5     Flexibility   Hamstrings Rt 54 deg; Lr 75 deg    Quadriceps Rt 94; Lt 83    ITB tight bilat      Palpation   Palpation comment tender and painful medial knee through joint line and proximal to joint through adductors/lateral quad      Balance   Balance Assessed --  SLS bilat 10 sec pain on Rt      Functional Gait  Assessment   Gait assessed  --  ambulates with limp Lt LE in wt bearing Lt             Objective measurements completed on examination: See above findings.          [facility] Adult PT Treatment/Exercise - 05/06/17 0001      Neuro Re-ed    Neuro Re-ed Details  to work on standing w/ equal wt bearing bilat LE's      Knee/Hip Exercises: Stretches   Passive Hamstring Stretch 3 reps;30 seconds   Quad Stretch 3 reps;30 seconds   ITB Stretch 3 reps;30 seconds     Knee/Hip Exercises: Supine   Quad Sets Strengthening;Right;10 reps  10 sec hold    Straight Leg Raises Strengthening;Right;10 reps     Knee/Hip Exercises: Prone   Other Prone Exercises glut sets 10 sec x 10      Vasopneumatic   Number Minutes Vasopneumatic  15 minutes   Vasopnuematic Location  Knee  Rt   Vasopneumatic Pressure Low   Vasopneumatic Temperature  34 deg                 PT Education - 05/06/17 0837    Education provided Yes   Education Details HEP    Person(s) Educated Patient   Methods Explanation;Demonstration;Tactile cues;Verbal cues;Handout   Comprehension Verbalized understanding;Returned demonstration;Verbal  cues required;Tactile cues required              PT Long Term Goals - 05/06/17 0806      PT LONG TERM GOAL #1   Title  Increase AROM Rt knee flexion 5-10 deg 06/17/17   Time 6   Period Weeks   Status New     PT LONG TERM GOAL #2   Title Increase strength Rt LE to 5-/5 to 5/5 throughout 06/17/17   Time 6   Period Weeks   Status New     PT LONG TERM GOAL #3   Title Ambulate with normal gait pattern for functional distances (down the aisle for wedding)  06/17/17   Time 6   Status New     PT LONG TERM GOAL #4   Title Independent in HEP 06/17/17   Time 6   Period Weeks   Status New     PT LONG TERM GOAL #5   Title Improve FOTO to </= 44% limitation 06/17/17   Time 6   Period Weeks   Status New                Plan - 05/06/17 1104    Clinical Impression Statement Patient presents with Rt knee pain and dysfunction following strain while walking up stairs 04/30/17. She has limited ROM and decreased strength Rt LE. She has decreased wt bearing Rt LE in standing and abnormal gait pattern. She is anxious to resolve current symptoms to walk down the aisle for her wedding in three weeks.    Clinical Presentation Stable   Clinical Decision Making Low   Rehab Potential Good   PT Frequency 2x / week   PT Duration 6 weeks   PT Treatment/Interventions Patient/family education;ADLs/Self Care Home Management;Electrical Stimulation;Cryotherapy;Iontophoresis 4mg /ml Dexamethasone;Moist Heat;Ultrasound;Dry needling;Manual techniques;Therapeutic activities;Therapeutic exercise;Neuromuscular re-education   PT Next Visit Plan review exercise; progress with ROM/strengthening; gait training as needed; modalities as indicated    Consulted and Agree with Plan of Care Patient      Patient will benefit from skilled therapeutic intervention in order to improve the following deficits and impairments:  Postural dysfunction, Improper body mechanics, Pain, Decreased range of motion, Decreased strength, Abnormal gait  Visit  Diagnosis: Acute pain of right knee - Plan: PT plan of care cert/re-cert  Weakness generalized - Plan: PT plan of care cert/re-cert  Abnormality of gait - Plan: PT plan of care cert/re-cert     Problem List Patient Active Problem List   Diagnosis Date Noted  . Restless legs syndrome 07/19/2016  . Closed fracture of proximal phalanx of right little finger 06/17/2016  . History of left knee replacement 11/25/2015  . Primary osteoarthritis of left knee 09/30/2015  . Primary osteoarthritis of knee 09/30/2015  . Primary osteoarthritis of left wrist 07/28/2015  . Olecranon bursitis of right elbow 06/16/2015  . Hemorrhoid 06/12/2014  . Absolute anemia 06/12/2014  . Abnormal facial hair 08/03/2013  . Trochanteric bursitis of both hips 06/12/2013  . Low back pain 05/21/2013  . Osteoarthritis of both knees 05/04/2013  . Metatarsalgia of left foot 05/04/2013  . Obesity 02/22/2012  . Migraine without aura 11/23/2011  . Muscle spasm 11/23/2011  . Insomnia 11/23/2011  . RHEUMATOID ARTHRITIS 08/15/2008    Terald Jump Nilda Simmer PT, MPH  05/06/2017, 11:30 AM  [facility] [place], [place], [address] Phone: [phone]   Fax:  [phone]  Name: JUDE LINCK MRN: [id number] Date of Birth: 01/04/1961

## 2017-05-09 ENCOUNTER — Ambulatory Visit (INDEPENDENT_AMBULATORY_CARE_PROVIDER_SITE_OTHER): Payer: Managed Care, Other (non HMO) | Admitting: Physical Therapy

## 2017-05-09 DIAGNOSIS — R531 Weakness: Secondary | ICD-10-CM | POA: Diagnosis not present

## 2017-05-09 DIAGNOSIS — R269 Unspecified abnormalities of gait and mobility: Secondary | ICD-10-CM

## 2017-05-09 DIAGNOSIS — M25561 Pain in right knee: Secondary | ICD-10-CM | POA: Diagnosis not present

## 2017-05-09 NOTE — Therapy (Addendum)
[place], [place], [address] Phone: [phone]   Fax:  [phone]  Physical Therapy Treatment  Patient Details  Name: Rose Long MRN: [id number] Date of Birth: 1961-07-08 Referring Provider: Dr Melrose Nakayama  Encounter Date: 05/09/2017      PT End of Session - 05/09/17 1102    Visit Number 2   Number of Visits 12   Date for PT Re-Evaluation 06/17/17   PT Start Time 1101   PT Stop Time 1155   PT Time Calculation (min) 54 min   Activity Tolerance Patient tolerated treatment well      Past Medical History:  Diagnosis Date  . Anemia   . Arthritis   . Complication of anesthesia    PONV  . Family history of adverse reaction to anesthesia    pt's mother stopped breathing possibly due to sleep apnea  . Migraine   . Obesity   . Simple ovarian cyst    Right    Past Surgical History:  Procedure Laterality Date  . ABDOMINOPLASTY/PANNICULECTOMY    . BREAST SURGERY  1998   reduction  . BREAST SURGERY  10/2013   Lift, tummy tuck  . NASAL SINUS SURGERY  1999  . 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 TOTAL KNEE ARTHROPLASTY Left 09/30/2015   Procedure: TOTAL KNEE ARTHROPLASTY;  Surgeon: Melrose Nakayama, MD;  Location: [facility];  Service: Orthopedics;  Laterality: Left;  . under arm skin removed    . uterine ablation  2012    There were no vitals filed for this visit.      Subjective Assessment - 05/09/17 1103    Subjective Patient states that her Rt knee is hurting less and that she feels it was probably just a strain and it is improving every day.   Pertinent History Lt TKA 2017    How long can you sit comfortably? 1 hour    How long can you stand comfortably? unsure - better standing than sitting    How long can you walk comfortably? unsure    Diagnostic tests xrays   Patient Stated Goals get rid of the pain and and to be able to walk down the asile in 3 weeks with normal gait and no  assistive device    Currently in Pain? Yes   Pain Score 3    Pain Location Knee   Pain Orientation Right   Pain Descriptors / Indicators Aching   Pain Onset 1 to 4 weeks ago   Aggravating Factors  moving; rotating   Pain Relieving Factors ice; rest            [facility] PT Assessment - 05/09/17 0001      Assessment   Medical Diagnosis Lt knee strain    Referring Provider Dr Melrose Nakayama   Onset Date/Surgical Date 04/30/17   Hand Dominance Left   Next MD Visit 05/23/17   Prior Therapy yes for Lt knee           [facility] Adult PT Treatment/Exercise - 05/09/17 0001      Knee/Hip Exercises: Stretches   Passive Hamstring Stretch 2 reps;30 seconds  Supine, with band   Quad Stretch 2 reps;30 seconds  Prone, with band   ITB Stretch 2 reps;30 seconds  Supine, with band   Gastroc Stretch Right;2 reps;30 seconds   Soleus Stretch Right;2 reps;30 seconds   Other Knee/Hip Stretches Adductor stretch; 2 reps, 30 sec  Supine, with band  Knee/Hip Exercises: Aerobic   Nustep L4: 5 min.     Knee/Hip Exercises: Standing   Wall Squat 2 sets;10 reps;5 seconds  With block. VC's for form; visual feedback with mirror     Knee/Hip Exercises: Seated   Long Arc Quad Right;2 sets;10 reps;Weights   Long Arc Quad Weight --  3 lbs.     Knee/Hip Exercises: Supine   Straight Leg Raises Strengthening;Right;10 reps  3 lb. ankle weight     Vasopneumatic   Number Minutes Vasopneumatic  15 minutes   Vasopnuematic Location  Knee  Rt   Vasopneumatic Pressure Low   Vasopneumatic Temperature  34 deg      Manual Therapy   Manual Therapy Myofascial release   Manual therapy comments --  Rt distal medial quad, distal Rt ITB.                PT Education - 05/09/17 1149    Education provided Yes   Education Details HEP   Person(s) Educated Patient   Methods Explanation;Demonstration;Verbal cues;Handout;Tactile cues   Comprehension Verbalized understanding;Returned demonstration;Verbal  cues required             PT Long Term Goals - 05/09/17 1244      PT LONG TERM GOAL #1   Title  Increase AROM Rt knee flexion 5-10 deg 06/17/17   Time 6   Period Weeks   Status On-going     PT LONG TERM GOAL #2   Title Increase strength Rt LE to 5-/5 to 5/5 throughout 06/17/17   Time 6   Period Weeks   Status On-going     PT LONG TERM GOAL #3   Title Ambulate with normal gait pattern for functional distances (down the aisle for wedding)  06/17/17   Time 6   Period Weeks   Status On-going     PT LONG TERM GOAL #4   Title Independent in HEP 06/17/17   Time 6   Period Weeks   Status On-going     PT LONG TERM GOAL #5   Title Improve FOTO to </= 44% limitation 06/17/17   Time 6   Period Weeks   Status On-going               Plan - 05/09/17 1200    Clinical Impression Statement Patient tolerated treatment well and is ready for increased resistance in strengthening exercises. Pt required tactile cues to improve form with Rt leg position while performing wall squats. Patient will benefit from continued PT intervention to increase functional mobility.   Rehab Potential Good   PT Frequency 2x / week   PT Duration 6 weeks   PT Treatment/Interventions Patient/family education;ADLs/Self Care Home Management;Electrical Stimulation;Cryotherapy;Iontophoresis 4mg /ml Dexamethasone;Moist Heat;Ultrasound;Dry needling;Manual techniques;Therapeutic activities;Therapeutic exercise;Neuromuscular re-education   PT Next Visit Plan Progress with ROM/strengthening; gait training as needed; modalities as needed.      Patient will benefit from skilled therapeutic intervention in order to improve the following deficits and impairments:  Postural dysfunction, Improper body mechanics, Pain, Decreased range of motion, Decreased strength, Abnormal gait  Visit Diagnosis: Acute pain of right knee  Weakness generalized  Abnormality of gait     Problem List Patient Active Problem List    Diagnosis Date Noted  . Restless legs syndrome 07/19/2016  . Closed fracture of proximal phalanx of right little finger 06/17/2016  . History of left knee replacement 11/25/2015  . Primary osteoarthritis of left knee 09/30/2015  . Primary osteoarthritis of knee 09/30/2015  . Primary  osteoarthritis of left wrist 07/28/2015  . Olecranon bursitis of right elbow 06/16/2015  . Hemorrhoid 06/12/2014  . Absolute anemia 06/12/2014  . Abnormal facial hair 08/03/2013  . Trochanteric bursitis of both hips 06/12/2013  . Low back pain 05/21/2013  . Osteoarthritis of both knees 05/04/2013  . Metatarsalgia of left foot 05/04/2013  . Obesity 02/22/2012  . Migraine without aura 11/23/2011  . Muscle spasm 11/23/2011  . Insomnia 11/23/2011  . RHEUMATOID ARTHRITIS 08/15/2008    Andria Meuse, SPTA 05/09/2017, 1:07 PM  [facility] [place], [place], [address] Phone: [phone]   Fax:  [phone]  Name: Rose Long MRN: [id number] Date of Birth: 12/09/1960

## 2017-05-09 NOTE — Patient Instructions (Signed)
FUNCTIONAL MOBILITY: Wall Squat    Stance: shoulder-width on floor, against wall. Place feet in front of hips. Bend hips and knees. Keep back straight. Do not allow knees to bend past toes. Squeeze glutes and quads to stand. ___ reps per set, ___ sets per day, ___ days per week  Copyright  VHI. All rights reserved.  Calf Stretch (Soleus)    Gently bend knees slightly and move one foot back. Lean into wall so that stretch is felt in back lower calf. Hold ____ seconds. Repeat with other leg back. Repeat ____ times. Do ____ sessions per day.  http://gt2.exer.us/418   Copyright  VHI. All rights reserved.  Calf Stretch    Place hands on wall at shoulder height. Keeping back leg straight, bend front leg, feet pointing forward, heels flat on floor. Lean forward slightly until stretch is felt in calf of back leg. Hold stretch ___ seconds, breathing slowly in and out. Repeat stretch with other leg back. Do ___ sessions per day. Variation: Use chair or table for support.  Copyright  VHI. All rights reserved.

## 2017-05-13 ENCOUNTER — Ambulatory Visit (INDEPENDENT_AMBULATORY_CARE_PROVIDER_SITE_OTHER): Payer: Managed Care, Other (non HMO) | Admitting: Physical Therapy

## 2017-05-13 DIAGNOSIS — M25561 Pain in right knee: Secondary | ICD-10-CM | POA: Diagnosis not present

## 2017-05-13 DIAGNOSIS — R269 Unspecified abnormalities of gait and mobility: Secondary | ICD-10-CM | POA: Diagnosis not present

## 2017-05-13 DIAGNOSIS — M25662 Stiffness of left knee, not elsewhere classified: Secondary | ICD-10-CM

## 2017-05-13 DIAGNOSIS — R531 Weakness: Secondary | ICD-10-CM

## 2017-05-13 NOTE — Therapy (Signed)
[place], [place], [address] Phone: [phone]   Fax:  [phone]  Physical Therapy Treatment  Patient Details  Name: Rose Long MRN: [id number] Date of Birth: 1961/04/12 Referring Provider: Dr. Melrose Nakayama   Encounter Date: 05/13/2017      PT End of Session - 05/13/17 1027    Visit Number 3   Number of Visits 12   Date for PT Re-Evaluation 06/17/17   PT Start Time 0814   PT Stop Time 1105   PT Time Calculation (min) 50 min   Activity Tolerance Patient tolerated treatment well   Behavior During Therapy [facility] for tasks assessed/performed      Past Medical History:  Diagnosis Date  . Anemia   . Arthritis   . Complication of anesthesia    PONV  . Family history of adverse reaction to anesthesia    pt's mother stopped breathing possibly due to sleep apnea  . Migraine   . Obesity   . Simple ovarian cyst    Right    Past Surgical History:  Procedure Laterality Date  . ABDOMINOPLASTY/PANNICULECTOMY    . BREAST SURGERY  1998   reduction  . BREAST SURGERY  10/2013   Lift, tummy tuck  . NASAL SINUS SURGERY  1999  . 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 TOTAL KNEE ARTHROPLASTY Left 09/30/2015   Procedure: TOTAL KNEE ARTHROPLASTY;  Surgeon: Melrose Nakayama, MD;  Location: [place];  Service: Orthopedics;  Laterality: Left;  . under arm skin removed    . uterine ablation  2012    There were no vitals filed for this visit.      Subjective Assessment - 05/13/17 1017    Subjective Pt reports her Rt knee is progressively feeling better.  She has been busy going through her stuff prior to her wedding.    Patient Stated Goals get rid of the pain and and to be able to walk down the asile in 3 weeks with normal gait and no assistive device    Currently in Pain? Yes   Pain Score 2    Pain Location Knee   Pain Orientation Right;Lateral   Pain Descriptors / Indicators Aching   Aggravating  Factors  overuse   Pain Relieving Factors ice, rest            [facility] PT Assessment - 05/13/17 0001      Assessment   Medical Diagnosis Lt knee strain    Referring Provider Dr. Melrose Nakayama    Onset Date/Surgical Date 04/30/17   Hand Dominance Left   Next MD Visit 05/23/17     Strength   Right Hip Flexion 5/5   Right Hip Extension --  5-/5   Right Hip ABduction --  5-/5   Right Knee Flexion --  5-/5   Right Knee Extension 5/5     Flexibility   Hamstrings Rt: 80 deg   Quadriceps Rt: 94 deg                     [facility] Adult PT Treatment/Exercise - 05/13/17 0001      Knee/Hip Exercises: Stretches   Passive Hamstring Stretch 2 reps;30 seconds;Right  Supine, with strap   Quad Stretch 2 reps;30 seconds  Prone, with strap   ITB Stretch 30 seconds;3 reps  Supine, with strap   Piriformis Stretch Right;1 rep;30 seconds   Gastroc Stretch Right;2 reps;30 seconds   Other Knee/Hip Stretches Adductor stretch; 2  reps, 30 sec  Supine, with band     Knee/Hip Exercises: Aerobic   Nustep L5: 5 min      Modalities   Modalities Ultrasound     Ultrasound   Ultrasound Location Rt distal quad/ lateral hamstring   Ultrasound Parameters 100%, 8 min, 1.2 w/cm2   Ultrasound Goals Pain  tightness     Manual Therapy   Manual Therapy Soft tissue mobilization   Soft tissue mobilization Edge tool assistance to Rt lateral quad, distal lateral hamstring; STM along Rt ITB, Lateral hamstring, lateral quad to decrease fascial restrictions and pain.                      PT Long Term Goals - 05/13/17 1244      PT LONG TERM GOAL #1   Title  Increase AROM Rt knee flexion 5-10 deg 06/17/17   Time 6   Period Weeks   Status Partially Met     PT LONG TERM GOAL #2   Title Increase strength Rt LE to 5-/5 to 5/5 throughout 06/17/17   Time 6   Period Weeks   Status Partially Met     PT LONG TERM GOAL #3   Title Ambulate with normal gait pattern for functional  distances (down the aisle for wedding)  06/17/17   Time 6   Period Weeks   Status Achieved     PT LONG TERM GOAL #4   Title Independent in HEP 06/17/17   Time 6   Period Weeks   Status On-going     PT LONG TERM GOAL #5   Title Improve FOTO to </= 44% limitation 06/17/17   Time 6   Period Weeks   Status On-going               Plan - 05/13/17 1238    Clinical Impression Statement Pt demonstrated improved strength in BLE and RLE hamstring flexibility.   Overall her pain level has improved.  Pt reported elimination of pain after manual therapy. Pt has partially met her goals.  Pt verbalized interest in d/c to HEP after 1-2 more sessions due to schedule conflicts.    Rehab Potential Good   PT Frequency 2x / week   PT Duration 6 weeks   PT Treatment/Interventions Patient/family education;ADLs/Self Care Home Management;Electrical Stimulation;Cryotherapy;Iontophoresis 18m/ml Dexamethasone;Moist Heat;Ultrasound;Dry needling;Manual techniques;Therapeutic activities;Therapeutic exercise;Neuromuscular re-education   PT Next Visit Plan DN to Rt lateral quad/hamstring.  Progress HEP as tolerated.     Consulted and Agree with Plan of Care Patient      Patient will benefit from skilled therapeutic intervention in order to improve the following deficits and impairments:  Postural dysfunction, Improper body mechanics, Pain, Decreased range of motion, Decreased strength, Abnormal gait  Visit Diagnosis: Acute pain of right knee  Weakness generalized  Abnormality of gait  Stiffness of knee joint, left     Problem List Patient Active Problem List   Diagnosis Date Noted  . Restless legs syndrome 07/19/2016  . Closed fracture of proximal phalanx of right little finger 06/17/2016  . History of left knee replacement 11/25/2015  . Primary osteoarthritis of left knee 09/30/2015  . Primary osteoarthritis of knee 09/30/2015  . Primary osteoarthritis of left wrist 07/28/2015  . Olecranon  bursitis of right elbow 06/16/2015  . Hemorrhoid 06/12/2014  . Absolute anemia 06/12/2014  . Abnormal facial hair 08/03/2013  . Trochanteric bursitis of both hips 06/12/2013  . Low back pain 05/21/2013  . Osteoarthritis of  both knees 05/04/2013  . Metatarsalgia of left foot 05/04/2013  . Obesity 02/22/2012  . Migraine without aura 11/23/2011  . Muscle spasm 11/23/2011  . Insomnia 11/23/2011  . RHEUMATOID ARTHRITIS 08/15/2008   Kerin Perna, PTA 05/13/17 12:46 PM  [place] [facility] [place], [place], [address] Phone: [phone]   Fax:  [phone]  Name: Rose Long MRN: [id number] Date of Birth: Apr 19, 1961

## 2017-05-17 ENCOUNTER — Encounter: Payer: Managed Care, Other (non HMO) | Admitting: Physical Therapy

## 2017-05-19 ENCOUNTER — Encounter: Payer: Managed Care, Other (non HMO) | Admitting: Physical Therapy

## 2017-05-20 ENCOUNTER — Encounter: Payer: Managed Care, Other (non HMO) | Admitting: Rehabilitative and Restorative Service Providers"

## 2017-05-23 ENCOUNTER — Encounter: Payer: Managed Care, Other (non HMO) | Admitting: Rehabilitative and Restorative Service Providers"

## 2017-05-24 ENCOUNTER — Encounter: Payer: Self-pay | Admitting: Physical Therapy

## 2017-05-24 ENCOUNTER — Ambulatory Visit (INDEPENDENT_AMBULATORY_CARE_PROVIDER_SITE_OTHER): Payer: Managed Care, Other (non HMO) | Admitting: Physical Therapy

## 2017-05-24 DIAGNOSIS — M25561 Pain in right knee: Secondary | ICD-10-CM

## 2017-05-24 DIAGNOSIS — R531 Weakness: Secondary | ICD-10-CM

## 2017-05-24 DIAGNOSIS — M25662 Stiffness of left knee, not elsewhere classified: Secondary | ICD-10-CM | POA: Diagnosis not present

## 2017-05-24 DIAGNOSIS — R269 Unspecified abnormalities of gait and mobility: Secondary | ICD-10-CM | POA: Diagnosis not present

## 2017-05-24 NOTE — Therapy (Signed)
[facility] [place], [place], [address] Phone: [phone]   Fax:  [phone]  Physical Therapy Treatment  Patient Details  Name: Rose Long MRN: [id number] Date of Birth: 1961/08/21 Referring Provider: Dr Melrose Nakayama  Encounter Date: 05/24/2017      PT End of Session - 05/24/17 0807    Visit Number 4   Number of Visits 12   Date for PT Re-Evaluation 06/17/17   PT Start Time 0807   PT Stop Time (P)  0902   PT Time Calculation (min) (P)  55 min      Past Medical History:  Diagnosis Date  . Anemia   . Arthritis   . Complication of anesthesia    PONV  . Family history of adverse reaction to anesthesia    pt's mother stopped breathing possibly due to sleep apnea  . Migraine   . Obesity   . Simple ovarian cyst    Right    Past Surgical History:  Procedure Laterality Date  . ABDOMINOPLASTY/PANNICULECTOMY    . BREAST SURGERY  1998   reduction  . BREAST SURGERY  10/2013   Lift, tummy tuck  . NASAL SINUS SURGERY  1999  . 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 TOTAL KNEE ARTHROPLASTY Left 09/30/2015   Procedure: TOTAL KNEE ARTHROPLASTY;  Surgeon: Melrose Nakayama, MD;  Location: [facility];  Service: Orthopedics;  Laterality: Left;  . under arm skin removed    . uterine ablation  2012    There were no vitals filed for this visit.      Subjective Assessment - 05/24/17 0807    Subjective [name] reports that today will be her last visit, she is doing well, she is getting married on Saturday, saw the MD last week.  Does wish to have DN performed today.    Patient Stated Goals get rid of the pain and and to be able to walk down the asile in 3 weeks with normal gait and no assistive device    Currently in Pain? No/denies            [facility] PT Assessment - 05/24/17 0001      Assessment   Medical Diagnosis Lt knee strain    Referring Provider Dr Melrose Nakayama   Onset Date/Surgical Date 04/30/17    Hand Dominance Left   Next MD Visit 05/23/17     Observation/Other Assessments   Focus on Therapeutic Outcomes (FOTO)  28% limited     AROM   Right Knee Extension 0   Right Knee Flexion 125                     [facility] Adult PT Treatment/Exercise - 05/24/17 0001      Knee/Hip Exercises: Stretches   Passive Hamstring Stretch Right;1 rep;60 seconds   Quad Stretch Right;1 rep;60 seconds   ITB Stretch Right;1 rep;60 seconds     Knee/Hip Exercises: Aerobic   Nustep L5: 5 min      Modalities   Modalities Electrical Stimulation;Moist Heat     Moist Heat Therapy   Number Minutes Moist Heat 15 Minutes   Moist Heat Location --  Rt thigh     Electrical Stimulation   Electrical Stimulation Location Rt lateral thigh   Electrical Stimulation Action IFC    Electrical Stimulation Parameters to tolerance   Electrical Stimulation Goals Tone;Pain     Manual Therapy   Manual Therapy Soft tissue mobilization   Soft tissue  mobilization STM to Rt lateral  thigh hamstring and quad.           Trigger Point Dry Needling - 05/24/17 0816    Consent Given? Yes   Education Handout Provided No   Muscles Treated Lower Body Quadriceps;Hamstring;Tensor fascia lata   Tensor Fascia Lata Response Palpable increased muscle length;Twitch response elicited  Rt   Quadriceps Response Palpable increased muscle length;Twitch response elicited  Rt lateral    Hamstring Response Palpable increased muscle length;Twitch response elicited  Rt lateral                   PT Long Term Goals - 05/24/17 0812      PT LONG TERM GOAL #1   Title  Increase AROM Rt knee flexion 5-10 deg 06/17/17   Status Achieved     PT LONG TERM GOAL #2   Title Increase strength Rt LE to 5-/5 to 5/5 throughout 06/17/17   Status Achieved     PT LONG TERM GOAL #3   Title Ambulate with normal gait pattern for functional distances (down the aisle for wedding)  06/17/17   Status Achieved     PT LONG TERM GOAL #4    Title Independent in HEP 06/17/17   Status Achieved     PT LONG TERM GOAL #5   Title Improve FOTO to </= 44% limitation 06/17/17   Status Achieved  scored 28* limited               Plan - 05/24/17 0927    Clinical Impression Statement [name] has done excellent with therapy, all goals met, no more pain, ready for discharge   PT Next Visit Plan discharge to HEP   Consulted and Agree with Plan of Care Patient      Patient will benefit from skilled therapeutic intervention in order to improve the following deficits and impairments:     Visit Diagnosis: Acute pain of right knee  Weakness generalized  Abnormality of gait  Stiffness of knee joint, left     Problem List Patient Active Problem List   Diagnosis Date Noted  . Restless legs syndrome 07/19/2016  . Closed fracture of proximal phalanx of right little finger 06/17/2016  . History of left knee replacement 11/25/2015  . Primary osteoarthritis of left knee 09/30/2015  . Primary osteoarthritis of knee 09/30/2015  . Primary osteoarthritis of left wrist 07/28/2015  . Olecranon bursitis of right elbow 06/16/2015  . Hemorrhoid 06/12/2014  . Absolute anemia 06/12/2014  . Abnormal facial hair 08/03/2013  . Trochanteric bursitis of both hips 06/12/2013  . Low back pain 05/21/2013  . Osteoarthritis of both knees 05/04/2013  . Metatarsalgia of left foot 05/04/2013  . Obesity 02/22/2012  . Migraine without aura 11/23/2011  . Muscle spasm 11/23/2011  . Insomnia 11/23/2011  . RHEUMATOID ARTHRITIS 08/15/2008    Raeghan Demeter,SUE 05/24/2017, 9:28 AM  [facility] [place], [place], [address] Phone: [phone]   Fax:  [phone]  Name: JESICCA DIPIERRO MRN: [id number] Date of Birth: 07-25-1961   PHYSICAL THERAPY DISCHARGE SUMMARY  Visits from Start of Care: 4  Current functional level related to goals / functional outcomes: See above    Remaining deficits: none   Education / Equipment: HEP Plan: Patient agrees to discharge.  Patient goals were met. Patient is being discharged due to meeting the stated rehab goals.  ?????    Jeral Pinch, PT 05/24/17 9:29 AM

## 2017-05-25 ENCOUNTER — Encounter: Payer: Managed Care, Other (non HMO) | Admitting: Rehabilitative and Restorative Service Providers"

## 2017-08-01 ENCOUNTER — Telehealth: Payer: Self-pay | Admitting: Adult Health

## 2017-08-01 MED ORDER — TOPIRAMATE 100 MG PO TABS
100.0000 mg | ORAL_TABLET | Freq: Every day | ORAL | 1 refills | Status: DC
Start: 2017-08-01 — End: 2017-10-06

## 2017-08-01 NOTE — Telephone Encounter (Signed)
Called pt and she is no longer with her plan from previous job.  She is using her local pharmacy.  I will send to [facility] in [place]. She verbalized understanding.

## 2017-08-01 NOTE — Telephone Encounter (Signed)
Pt request refill for topiramate (TOPAMAX) 100 MG tablet. She is wanting to pick up RX today. She has a new job and Building services engineer.

## 2017-08-18 ENCOUNTER — Ambulatory Visit (INDEPENDENT_AMBULATORY_CARE_PROVIDER_SITE_OTHER): Payer: No Typology Code available for payment source | Admitting: Family Medicine

## 2017-08-18 ENCOUNTER — Encounter: Payer: Self-pay | Admitting: Family Medicine

## 2017-08-18 VITALS — BP 122/77 | HR 69 | Ht 67.0 in | Wt 221.0 lb

## 2017-08-18 DIAGNOSIS — Z23 Encounter for immunization: Secondary | ICD-10-CM

## 2017-08-18 DIAGNOSIS — M79671 Pain in right foot: Secondary | ICD-10-CM | POA: Diagnosis not present

## 2017-08-18 DIAGNOSIS — Z6834 Body mass index (BMI) 34.0-34.9, adult: Secondary | ICD-10-CM | POA: Diagnosis not present

## 2017-08-18 DIAGNOSIS — M722 Plantar fascial fibromatosis: Secondary | ICD-10-CM | POA: Diagnosis not present

## 2017-08-18 DIAGNOSIS — M170 Bilateral primary osteoarthritis of knee: Secondary | ICD-10-CM | POA: Diagnosis not present

## 2017-08-18 MED ORDER — SUMATRIPTAN SUCCINATE 100 MG PO TABS: ORAL_TABLET | ORAL | 11 refills | Status: DC

## 2017-08-18 NOTE — Patient Instructions (Addendum)
If your heels not improving over the next 3-4 weeks with conservative therapy then please let us know.  We can always get you in with 1 of our sports medicine providers for possible injection.  Night splints can also be helpful and these can be found on line.   Plantar Fasciitis Plantar fasciitis is a painful foot condition that affects the heel. It occurs when the band of tissue that connects the toes to the heel bone (plantar fascia) becomes irritated. This can happen after exercising too much or doing other repetitive activities (overuse injury). The pain from plantar fasciitis can range from mild irritation to severe pain that makes it difficult for you to walk or move. The pain is usually worse in the morning or after you have been sitting or lying down for a while. What are the causes? This condition may be caused by:  Standing for long periods of time.  Wearing shoes that do not fit.  Doing high-impact activities, including running, aerobics, and ballet.  Being overweight.  Having an abnormal way of walking (gait).  Having tight calf muscles.  Having high arches in your feet.  Starting a new athletic activity.  What are the signs or symptoms? The main symptom of this condition is heel pain. Other symptoms include:  Pain that gets worse after activity or exercise.  Pain that is worse in the morning or after resting.  Pain that goes away after you walk for a few minutes.  How is this diagnosed? This condition may be diagnosed based on your signs and symptoms. Your health care provider will also do a physical exam to check for:  A tender area on the bottom of your foot.  A high arch in your foot.  Pain when you move your foot.  Difficulty moving your foot.  You may also need to have imaging studies to confirm the diagnosis. These can include:  X-rays.  Ultrasound.  MRI.  How is this treated? Treatment for plantar fasciitis depends on the severity of the  condition. Your treatment may include:  Rest, ice, and over-the-counter pain medicines to manage your pain.  Exercises to stretch your calves and your plantar fascia.  A splint that holds your foot in a stretched, upward position while you sleep (night splint).  Physical therapy to relieve symptoms and prevent problems in the future.  Cortisone injections to relieve severe pain.  Extracorporeal shock wave therapy (ESWT) to stimulate damaged plantar fascia with electrical impulses. It is often used as a last resort before surgery.  Surgery, if other treatments have not worked after 12 months.  Follow these instructions at home:  Take medicines only as directed by your health care provider.  Avoid activities that cause pain.  Roll the bottom of your foot over a bag of ice or a bottle of cold water. Do this for 20 minutes, 3-4 times a day.  Perform simple stretches as directed by your health care provider.  Try wearing athletic shoes with air-sole or gel-sole cushions or soft shoe inserts.  Wear a night splint while sleeping, if directed by your health care provider.  Keep all follow-up appointments with your health care provider. How is this prevented?  Do not perform exercises or activities that cause heel pain.  Consider finding low-impact activities if you continue to have problems.  Lose weight if you need to. The best way to prevent plantar fasciitis is to avoid the activities that aggravate your plantar fascia. Contact a health care provider  if:  Your symptoms do not go away after treatment with home care measures.  Your pain gets worse.  Your pain affects your ability to move or do your daily activities. This information is not intended to replace advice given to you by your health care provider. Make sure you discuss any questions you have with your health care provider. Document Released: 06/29/2001 Document Revised: 03/08/2016 Document Reviewed:  08/14/2014 Elsevier Interactive Patient Education  [employer].

## 2017-08-18 NOTE — Progress Notes (Signed)
Subjective:    Patient ID: Rose Long, female    DOB: 09-18-1961, 56 y.o.   MRN: [id number]  HPI 56 year old female comes in today complaining of right heel pain.  Is been going on for about 3 months.  Initially she thought it might just be related to her weight and thought it would eventually get better but it has not.  She rates her pain a 3 out of 10.  She says is definitely worse in the mornings or if she is been sitting for a while and then gets up to stand up.  She describes it as a sharp stabbing pain at times.  And even when she is not walking on it it feels sore.  She has not tried any specific treatments except for rest and elevation.   She does have significant arthritis of both knees and lumbar spine.  She would like a note requesting that she be allowed to use a space heater at work because of her arthritis.  Particularly in the wintertime when it gets cold her joints will ache and warming them up actually helps.  In addition she would like a note to see if they would be able to provide her parking closer to the building.  Right now her current parking is about a block and a half away but they are getting ready to move it because they are tearing down the parking area and building a building.  She also wanted to know if we will be able to write a note for that would cover her dietary supplements.  She is in the last few weeks cut out sugar completely and has been taking a homeopathic weight loss regimen that includes vitamins and supplements.  And also includes increased proteins.  She actually feels like it has been very helpful and has been feeling well on it.   Review of Systems  BP 122/77   Pulse 69   Ht 5\' 7"  (1.702 m)   Wt 221 lb (100.2 kg)   SpO2 100%   BMI 34.61 kg/m     Allergies  Allergen Reactions  . Sulfa Antibiotics Anaphylaxis  . Sulfamethoxazole-Trimethoprim Anaphylaxis, Swelling and Rash    Redness and swelling  . Bee Venom     Past Medical History:   Diagnosis Date  . Anemia   . Arthritis   . Complication of anesthesia    PONV  . Family history of adverse reaction to anesthesia    pt's mother stopped breathing possibly due to sleep apnea  . Migraine   . Obesity   . Simple ovarian cyst    Right    Past Surgical History:  Procedure Laterality Date  . ABDOMINOPLASTY/PANNICULECTOMY    . BREAST SURGERY  1998   reduction  . BREAST SURGERY  10/2013   Lift, tummy tuck  . NASAL SINUS SURGERY  1999  . 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 TOTAL KNEE ARTHROPLASTY Left 09/30/2015   Procedure: TOTAL KNEE ARTHROPLASTY;  Surgeon: Melrose Nakayama, MD;  Location: [facility];  Service: Orthopedics;  Laterality: Left;  . under arm skin removed    . uterine ablation  2012    Social History   Social History  . Marital status: Single    Spouse name: N/A  . Number of children: 0  . Years of education: Masters   Occupational History  . [employer]    Fraud investigator   Social History Main Topics  . Smoking status: Never  Smoker  . Smokeless tobacco: Never Used  . Alcohol use 0.6 oz/week    1 Standard drinks or equivalent per week     Comment: socially  . Drug use: No  . Sexual activity: Not Currently   Other Topics Concern  . Not on file   Social History Narrative   Parents moved into her home on June 2013.  2 caffeine drinks per day.  Works out 1 hour 5-6 days per week.    Left-handed    Family History  Problem Relation Age of Onset  . Hypertension Mother   . Bladder Cancer Mother   . Hyperlipidemia Mother   . Migraines Mother   . Arthritis Mother   . Obesity Mother   . Hypertension Father   . CVA Father   . Heart failure Father   . Arthritis Father   . Hyperlipidemia Father   . Kidney failure Father   . Heart disease Maternal Grandfather   . Lung cancer Paternal Grandmother        lung  . Alcohol abuse Paternal Uncle   . Diabetes Cousin   . Arthritis Sister   . Hypertension Brother   .  Arthritis Brother   . Obesity Brother     Outpatient Encounter Prescriptions as of 08/18/2017  Medication Sig  . albuterol (PROVENTIL HFA;VENTOLIN HFA) 108 (90 Base) MCG/ACT inhaler Inhale 1-2 puffs into the lungs every 6 (six) hours as needed for wheezing or shortness of breath.  . calcium citrate-vitamin D 200-200 MG-UNIT TABS Take 1 tablet by mouth daily.    . Cholecalciferol (VITAMIN D3) 5000 units TABS Take 1 tablet (5,000 Units total) by mouth 2 (two) times daily.  . ferrous sulfate 325 (65 FE) MG tablet Take 325 mg by mouth daily with breakfast.  . Iron-FA-B Cmp-C-Biot-Probiotic (FUSION PLUS) CAPS Take 1 capsule by mouth daily.  Marland Kitchen MAGNESIUM CITRATE PO Take 1 tablet by mouth 2 (two) times daily.   . Multiple Vitamin (MULTIVITAMIN) tablet Take 1 tablet by mouth daily.    . Riboflavin 400 MG TABS Take 1 tablet by mouth daily.  . SUMAtriptan (IMITREX) 100 MG tablet TAKE ONE TABLET BY MOUTH ONCE AS NEEDED FOR MIGRAINE  . topiramate (TOPAMAX) 100 MG tablet Take 1 tablet (100 mg total) by mouth at bedtime.  . [DISCONTINUED] phentermine (ADIPEX-P) 37.5 MG tablet One tab by mouth qAM  . [DISCONTINUED] SUMAtriptan (IMITREX) 100 MG tablet TAKE ONE TABLET BY MOUTH ONCE AS NEEDED FOR MIGRAINE 90 day supply for mail order (Patient taking differently: 50 mg. TAKE ONE TABLET BY MOUTH ONCE AS NEEDED FOR MIGRAINE 90 day supply for mail order)   No facility-administered encounter medications on file as of 08/18/2017.          Objective:   Physical Exam  Constitutional: She is oriented to person, place, and time. She appears well-developed and well-nourished.  HENT:  Head: Normocephalic and atraumatic.  Eyes: Conjunctivae and EOM are normal.  Cardiovascular: Normal rate.   Pulmonary/Chest: Effort normal.  Musculoskeletal:  Tender over the base of the left heel and just towards the arch of the foot.  Nontender over the metatarsals.  Ankle with normal range of motion and nontender of the Achilles  tendon.  Strength is 5 out of 5 in all directions.  Neurological: She is alert and oriented to person, place, and time.  Skin: Skin is dry. No pallor.  Psychiatric: She has a normal mood and affect. Her behavior is normal.  Vitals reviewed.  Assessment & Plan:  Right plantar fascitis - Discussed dx. given handout on stretches to do on her own at home.  She is not able to take anti-inflammatories because of her gastric bypass surgery.  Recommend icing massage.  If not improving over the next 3-4 weeks and please let us know.  She can also try night splints and wearing more supportive shoe wear with a small heel.  She did have flip-flops on today.  Can always get her in with 1 of our sports medicine providers for injection if not improving.  Weight loss would certainly help and she is actively working on that.  BMI 34  -she is made some great dietary changes and cutting out sugar and is currently taking some supplements.  I will be happy to write a letter to support that.  Osteoarthritis particularly of the knees and spine-I be happy to write a letter requesting a space heater and closer parking.

## 2017-08-22 ENCOUNTER — Encounter: Payer: Self-pay | Admitting: Family Medicine

## 2017-08-24 MED ORDER — AMBULATORY NON FORMULARY MEDICATION: 11 refills | Status: DC

## 2017-08-24 NOTE — Telephone Encounter (Signed)
Would you mind printing these as scripts.

## 2017-08-24 NOTE — Telephone Encounter (Signed)
Rx's written. Pt advised. She will pick up.

## 2017-09-22 ENCOUNTER — Encounter (HOSPITAL_COMMUNITY): Payer: Self-pay

## 2017-10-06 ENCOUNTER — Ambulatory Visit (INDEPENDENT_AMBULATORY_CARE_PROVIDER_SITE_OTHER): Payer: PRIVATE HEALTH INSURANCE | Admitting: Neurology

## 2017-10-06 ENCOUNTER — Encounter: Payer: Self-pay | Admitting: Neurology

## 2017-10-06 VITALS — BP 113/76 | HR 69 | Ht 67.0 in | Wt 219.5 lb

## 2017-10-06 DIAGNOSIS — G43019 Migraine without aura, intractable, without status migrainosus: Secondary | ICD-10-CM

## 2017-10-06 DIAGNOSIS — F5104 Psychophysiologic insomnia: Secondary | ICD-10-CM

## 2017-10-06 MED ORDER — TRAZODONE HCL 100 MG PO TABS: 100.0000 mg | ORAL_TABLET | Freq: Every day | ORAL | 3 refills | Status: DC

## 2017-10-06 MED ORDER — TOPIRAMATE 50 MG PO TABS: 150.0000 mg | ORAL_TABLET | Freq: Every day | ORAL | 3 refills | Status: DC

## 2017-10-06 NOTE — Progress Notes (Signed)
Reason for visit: Migraine headache  Rose Long is an 56 y.o. female  History of present illness:  Rose Long is a 56 year old left-handed white female with a history of migraine headaches since age 64.  The patient indicates that the weather changes and sleep deprivation are big activators for her headache.  She is having 1 or 2 headaches a week currently.  She is on 100 mg Topamax at night, she tolerates this well.  She takes 200 mg of Benadryl at night for sleep, she does this on a chronic basis.  The patient has been on trazodone previously, but she believes that she was only on 50 mg at night and it did not help her.  The patient takes Imitrex if needed for her migraines with good benefit.  She returns to the office today for an evaluation.  Past Medical History:  Diagnosis Date  . Anemia   . Arthritis   . Complication of anesthesia    PONV  . Family history of adverse reaction to anesthesia    pt's mother stopped breathing possibly due to sleep apnea  . Migraine   . Obesity   . Simple ovarian cyst    Right    Past Surgical History:  Procedure Laterality Date  . ABDOMINOPLASTY/PANNICULECTOMY    . BREAST SURGERY  1998   reduction  . BREAST SURGERY  10/2013   Lift, tummy tuck  . NASAL SINUS SURGERY  1999  . 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 TOTAL KNEE ARTHROPLASTY Left 09/30/2015   Procedure: TOTAL KNEE ARTHROPLASTY;  Surgeon: Melrose Nakayama, MD;  Location: [facility];  Service: Orthopedics;  Laterality: Left;  . under arm skin removed    . uterine ablation  2012    Family History  Problem Relation Age of Onset  . Hypertension Mother   . Bladder Cancer Mother   . Hyperlipidemia Mother   . Migraines Mother   . Arthritis Mother   . Obesity Mother   . Hypertension Father   . CVA Father   . Heart failure Father   . Arthritis Father   . Hyperlipidemia Father   . Kidney failure Father   . Heart disease Maternal Grandfather   . Lung cancer Paternal Grandmother          lung  . Alcohol abuse Paternal Uncle   . Diabetes Cousin   . Arthritis Sister   . Hypertension Brother   . Arthritis Brother   . Obesity Brother     Social history:  reports that  has never smoked. she has never used smokeless tobacco. She reports that she drinks about 0.6 oz of alcohol per week. She reports that she does not use drugs.    Allergies  Allergen Reactions  . Sulfa Antibiotics Anaphylaxis  . Sulfamethoxazole-Trimethoprim Anaphylaxis, Swelling and Rash    Redness and swelling  . Bee Venom     Medications:  Prior to Admission medications   Medication Sig Start Date End Date Taking? Authorizing Provider  albuterol (PROVENTIL HFA;VENTOLIN HFA) 108 (90 Base) MCG/ACT inhaler Inhale 1-2 puffs into the lungs every 6 (six) hours as needed for wheezing or shortness of breath. 10/21/16  Yes Phelps, Bronwen Betters, PA-C  AMBULATORY NON FORMULARY MEDICATION Medication Name: Optimal-V. Take 3 capsules 2 times daily with meals 08/24/17  Yes Metheney, Rene Kocher, MD  AMBULATORY NON FORMULARY MEDICATION Medication Name: Optimal-M. Take 2 capsules 2 times daily. 08/24/17  Yes Hali Marry, MD  AMBULATORY NON FORMULARY MEDICATION  Medication Name: Magnical-D. Take 4 capsules daily, with meals. 08/24/17  Yes Hali Marry, MD  AMBULATORY NON FORMULARY MEDICATION Medication Name: Slenderiix. Take 0.41ml three times daily before meals. 08/24/17  Yes Hali Marry, MD  AMBULATORY NON FORMULARY MEDICATION Medication Name: Alain Honey. Take 38ml three times daily before meals. 08/24/17  Yes Hali Marry, MD  AMBULATORY NON FORMULARY MEDICATION Medication Name: PureNourish. Take one scoop daily in shake. 08/24/17  Yes Hali Marry, MD  AMBULATORY NON FORMULARY MEDICATION Medication Name: PowerBoost. Take 1/2 scoop daily in shake. 08/24/17  Yes Hali Marry, MD  calcium citrate-vitamin D 200-200 MG-UNIT TABS Take 1 tablet by mouth daily.     Yes [provider]  Cholecalciferol (VITAMIN D3) 5000 units TABS Take 1 tablet (5,000 Units total) by mouth 2 (two) times daily. 02/16/16  Yes Hali Marry, MD  diphenhydrAMINE (BENADRYL) 25 mg capsule Take 25 mg by mouth at bedtime.   Yes [provider]  ferrous sulfate 325 (65 FE) MG tablet Take 325 mg by mouth daily with breakfast.   Yes [provider]  Iron-FA-B Cmp-C-Biot-Probiotic (FUSION PLUS) CAPS Take 1 capsule by mouth daily. 08/09/16  Yes Hali Marry, MD  MAGNESIUM CITRATE PO Take 1 tablet by mouth 2 (two) times daily.    Yes [provider]  Multiple Vitamin (MULTIVITAMIN) tablet Take 1 tablet by mouth daily.     Yes [provider]  Riboflavin 400 MG TABS Take 1 tablet by mouth daily.   Yes [provider]  SUMAtriptan (IMITREX) 100 MG tablet TAKE ONE TABLET BY MOUTH ONCE AS NEEDED FOR MIGRAINE 08/18/17  Yes Hali Marry, MD  topiramate (TOPAMAX) 100 MG tablet Take 1 tablet (100 mg total) by mouth at bedtime. 08/01/17  Yes Ward Givens, NP    ROS:  Out of a complete 14 system review of symptoms, the patient complains only of the following symptoms, and all other reviewed systems are negative.  Light sensitivity Runny nose Heat intolerance, flushing Restless legs, insomnia, frequent waking, daytime sleepiness Joint pain, aching muscles Anemia Headache  Blood pressure 113/76, pulse 69, height 5\' 7"  (1.702 m), weight 219 lb 8 oz (99.6 kg).  Physical Exam  General: The patient is alert and cooperative at the time of the examination.  The patient is moderately obese.  Skin: No significant peripheral edema is noted.   Neurologic Exam  Mental status: The patient is alert and oriented x 3 at the time of the examination. The patient has apparent normal recent and remote memory, with an apparently normal attention span and concentration ability.   Cranial nerves: Facial symmetry is present. Speech is  normal, no aphasia or dysarthria is noted. Extraocular movements are full. Visual fields are full.  Motor: The patient has good strength in all 4 extremities.  Sensory examination: Soft touch sensation is symmetric on the face, arms, and legs.  Coordination: The patient has good finger-nose-finger and heel-to-shin bilaterally.  Gait and station: The patient has a normal gait. Tandem gait is normal. Romberg is negative. No drift is seen.  Reflexes: Deep tendon reflexes are symmetric.   Assessment/Plan:  1.  Intractable migraine headache   2.  Chronic insomnia  The patient will go up on the Topamax dose taking 150 mg at night, a prescription was sent in.  The patient has chronic insomnia, we will retry the trazodone at 100 mg at night, if this is not effective she will double the dose.  She will call for any dose adjustments of this medication.  The patient will follow-up in 4 months.  Jill Alexanders MD 10/06/2017 3:44 PM  [facility] [address] [place], [place] [address]  Phone [phone] Fax [phone]

## 2017-10-18 DIAGNOSIS — J40 Bronchitis, not specified as acute or chronic: Secondary | ICD-10-CM

## 2017-10-18 HISTORY — DX: Bronchitis, not specified as acute or chronic: J40

## 2017-12-14 ENCOUNTER — Encounter: Payer: Self-pay | Admitting: Family Medicine

## 2017-12-23 ENCOUNTER — Encounter: Payer: Self-pay | Admitting: Family Medicine

## 2017-12-23 ENCOUNTER — Ambulatory Visit (INDEPENDENT_AMBULATORY_CARE_PROVIDER_SITE_OTHER): Payer: No Typology Code available for payment source | Admitting: Family Medicine

## 2017-12-23 VITALS — BP 130/71 | HR 72 | Ht 67.0 in | Wt 220.0 lb

## 2017-12-23 DIAGNOSIS — G5603 Carpal tunnel syndrome, bilateral upper limbs: Secondary | ICD-10-CM

## 2017-12-23 MED ORDER — DICLOFENAC SODIUM 1 % TD GEL: 4.0000 g | Freq: Four times a day (QID) | TRANSDERMAL | 1 refills | Status: DC

## 2017-12-23 MED ORDER — DICLOFENAC SODIUM 1.6 % TD GEL: 1.0000 "application " | Freq: Four times a day (QID) | TRANSDERMAL | 1 refills | Status: DC | PRN

## 2017-12-23 NOTE — Progress Notes (Signed)
Subjective:    Patient ID: Rose Long, female    DOB: [birth year], 57 y.o.   MRN: [id number]  HPI  57 yo female comes in today to discuss numbness and tingling in her hands and arms that is been waking her up for about the last 3 weeks.  Is been bothersome during the day as well.  He will bother her all the way up to her elbows bilaterally.  It does feel like it is worse on the left versus the right but she is also left-handed.  She is noticing lately she is been dropping things and feeling a little bit more weak with her hands.  She is unable to take oral anti-inflammatories because of her history of gastric bypass.  She is actually been wearing cockup splints bilaterally for a couple of months at this point without any significant relief.   Review of Systems   BP 130/71   Pulse 72   Ht 5\' 7"  (1.702 m)   Wt 220 lb (99.8 kg)   SpO2 100%   BMI 34.46 kg/m     Allergies  Allergen Reactions  . Sulfa Antibiotics Anaphylaxis  . Sulfamethoxazole-Trimethoprim Anaphylaxis, Swelling and Rash    Redness and swelling  . Bee Venom     Past Medical History:  Diagnosis Date  . Anemia   . Arthritis   . Complication of anesthesia    PONV  . Family history of adverse reaction to anesthesia    pt's mother stopped breathing possibly due to sleep apnea  . Migraine   . Obesity   . Simple ovarian cyst    Right    Past Surgical History:  Procedure Laterality Date  . ABDOMINOPLASTY/PANNICULECTOMY    . BREAST SURGERY  1998   reduction  . BREAST SURGERY  10/2013   Lift, tummy tuck  . NASAL SINUS SURGERY  1999  . 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 under arm skin removed    . uterine ablation  2012    Social History   Socioeconomic History  . Marital status: Married    Spouse name: Not on file  . Number of  children: 0  . Years of education: Masters  . Highest education level: Not on file  Social Needs  . Financial resource strain: Not on file  . Food insecurity - worry: Not on file  . Food insecurity - inability: Not on file  . Transportation needs - medical: Not on file  . Transportation needs - non-medical: Not on file  Occupational History  . 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 Smoking status: Never Smoker  . Smokeless tobacco: Never Used  Substance and Sexual Activity  . Alcohol use: Yes    Alcohol/week: 0.6 oz    Types: 1 Standard drinks or equivalent per week    Comment: socially  . Drug use: No  . Sexual activity: Not Currently  Other Topics Concern  . Not on file  Social History Narrative   Parents moved into her home on June 2013.  2 caffeine drinks per day.  Works out 1 hour 5-6 days per week.    Left-handed    Family History  Problem Relation Age of Onset  . Hypertension Mother   .  Bladder Cancer Mother   . Hyperlipidemia Mother   . Migraines Mother   . Arthritis Mother   . Obesity Mother   . Hypertension Father   . CVA Father   . Heart failure Father   . Arthritis Father   . Hyperlipidemia Father   . Kidney failure Father   . Heart disease Maternal Grandfather   . Lung cancer Paternal Grandmother        lung  . Alcohol abuse Paternal Uncle   . Diabetes Cousin   . Arthritis Sister   . Hypertension Brother   . Arthritis Brother   . Obesity Brother     Outpatient Encounter Medications as of 12/23/2017  Medication Sig  . albuterol (PROVENTIL HFA;VENTOLIN HFA) 108 (90 Base) MCG/ACT inhaler Inhale 1-2 puffs into the lungs every 6 (six) hours as needed for wheezing or shortness of breath.  . AMBULATORY NON FORMULARY MEDICATION Medication Name: Optimal-V. Take 3 capsules 2 times daily with meals  . AMBULATORY NON FORMULARY MEDICATION Medication Name: Optimal-M. Take 2 capsules 2  times daily.  . AMBULATORY NON FORMULARY MEDICATION Medication Name: Magnical-D. Take 4 capsules daily, with meals.  . AMBULATORY NON FORMULARY MEDICATION Medication Name: Slenderiix. Take 0.85ml three times daily before meals.  . AMBULATORY NON FORMULARY MEDICATION Medication Name: Alain Honey. Take 56ml three times daily before meals.  . AMBULATORY NON FORMULARY MEDICATION Medication Name: PureNourish. Take one scoop daily in shake.  Marland Kitchen AMBULATORY NON FORMULARY MEDICATION Medication Name: PowerBoost. Take 1/2 scoop daily in shake.  . calcium citrate-vitamin D 200-200 MG-UNIT TABS Take 1 tablet by mouth daily.    . Cholecalciferol (VITAMIN D3) 5000 units TABS Take 1 tablet (5,000 Units total) by mouth 2 (two) times daily.  . diphenhydrAMINE (BENADRYL) 25 mg capsule Take 25 mg by mouth as needed.   . ferrous sulfate 325 (65 FE) MG tablet Take 325 mg by mouth daily with breakfast.  . Iron-FA-B Cmp-C-Biot-Probiotic (FUSION PLUS) CAPS Take 1 capsule by mouth daily.  Marland Kitchen MAGNESIUM CITRATE PO Take 1 tablet by mouth 2 (two) times daily.   . Multiple Vitamin (MULTIVITAMIN) tablet Take 1 tablet by mouth daily.    . Riboflavin 400 MG TABS Take 1 tablet by mouth daily.  . SUMAtriptan (IMITREX) 100 MG tablet TAKE ONE TABLET BY MOUTH ONCE AS NEEDED FOR MIGRAINE  . topiramate (TOPAMAX) 50 MG tablet Take 3 tablets (150 mg total) by mouth at bedtime.  . traZODone (DESYREL) 100 MG tablet Take 1 tablet (100 mg total) by mouth at bedtime.  . diclofenac sodium (VOLTAREN) 1 % GEL Apply 4 g topically 4 (four) times daily.  . [DISCONTINUED] Diclofenac Sodium 1.6 % GEL Place 1 application onto the skin 4 (four) times daily as needed.   No facility-administered encounter medications on file as of 12/23/2017.           Objective:   Physical Exam  Constitutional: She is oriented to person, place, and time. She appears well-developed and well-nourished.  HENT:  Head: Normocephalic and atraumatic.  Eyes: Conjunctivae and  EOM are normal.  Cardiovascular: Normal rate.  Pulmonary/Chest: Effort normal.  Musculoskeletal:  Cervical spine with normal flexion extension rotation right and left and side bending.  Positive Tinel's and Phalen's sign bilaterally.  Nontender over the elbows and wrists.  Normal range of motion of the wrists.  Neurological: She is alert and oriented to person, place, and time.  Skin: Skin is dry. No pallor.  Psychiatric: She has a normal mood and affect. Her  behavior is normal.  Vitals reviewed.         Assessment & Plan:  Carpal tunnel syndrome -diagnosis.  Will try topical NSAID.  She is Artie been wearing the cockup splints.  Discussed also adjusting the ergonomics of her workstation.  Treatment with possible injection if not improving.  Given handout for exercises to do on her own at home.  If not improving then plan to follow-up with sports medicine in the next few weeks.

## 2017-12-23 NOTE — Patient Instructions (Addendum)
If not improving with the topical anti-inflammatory, nighttime splints, and home exercises and please follow-up with Dr. Dianah Field or Dr. Georgina Snell for possible injection. Also try adjusting the ergonomics of your workstation.  If not improving over the next few weeks and try to get in with 1 of our sports med docs for further

## 2018-01-30 ENCOUNTER — Telehealth: Payer: Self-pay

## 2018-01-30 ENCOUNTER — Encounter: Payer: Self-pay | Admitting: Physician Assistant

## 2018-01-30 ENCOUNTER — Ambulatory Visit (INDEPENDENT_AMBULATORY_CARE_PROVIDER_SITE_OTHER): Payer: No Typology Code available for payment source | Admitting: Physician Assistant

## 2018-01-30 ENCOUNTER — Ambulatory Visit: Payer: No Typology Code available for payment source | Admitting: Sports Medicine

## 2018-01-30 ENCOUNTER — Ambulatory Visit (INDEPENDENT_AMBULATORY_CARE_PROVIDER_SITE_OTHER): Payer: No Typology Code available for payment source

## 2018-01-30 VITALS — BP 100/66 | HR 59 | Temp 98.2°F | Resp 16 | Wt 218.0 lb

## 2018-01-30 DIAGNOSIS — J209 Acute bronchitis, unspecified: Secondary | ICD-10-CM

## 2018-01-30 DIAGNOSIS — R05 Cough: Secondary | ICD-10-CM

## 2018-01-30 DIAGNOSIS — Z980 Intestinal bypass and anastomosis status: Secondary | ICD-10-CM

## 2018-01-30 DIAGNOSIS — Z903 Acquired absence of stomach [part of]: Secondary | ICD-10-CM | POA: Diagnosis not present

## 2018-01-30 MED ORDER — PREDNISONE 50 MG PO TABS: 50.0000 mg | ORAL_TABLET | Freq: Every day | ORAL | 0 refills | Status: DC

## 2018-01-30 MED ORDER — GUAIFENESIN ER 600 MG PO TB12
1200.0000 mg | ORAL_TABLET | Freq: Two times a day (BID) | ORAL | Status: DC
Start: 2018-01-30 — End: 2018-06-26

## 2018-01-30 MED ORDER — ALBUTEROL SULFATE HFA 108 (90 BASE) MCG/ACT IN AERS: 1.0000 | INHALATION_SPRAY | RESPIRATORY_TRACT | 0 refills | Status: DC | PRN

## 2018-01-30 MED ORDER — HYDROCOD POLST-CPM POLST ER 10-8 MG/5ML PO SUER: 5.0000 mL | Freq: Two times a day (BID) | ORAL | 0 refills | Status: AC | PRN

## 2018-01-30 NOTE — Progress Notes (Signed)
HPI:                                                                Rose Long is a 57 y.o. female who presents to [facility] [place] today for cough and congestion  Cough  This is a new problem. The current episode started 1 to 4 weeks ago. The problem has been unchanged. The cough is productive of purulent sputum. Associated symptoms include chest pain, postnasal drip, rhinorrhea and wheezing. Pertinent negatives include no chills, fever, hemoptysis, myalgias or shortness of breath. Associated symptoms comments: + itchy eyes. Nothing aggravates the symptoms. Risk factors for lung disease include travel. She has tried OTC cough suppressant (+ antihistamine) for the symptoms. Her past medical history is significant for environmental allergies. There is no history of asthma, COPD or pneumonia.      Depression screen PHQ 2/9 08/18/2017  Decreased Interest 0  Down, Depressed, Hopeless 0  PHQ - 2 Score 0    No flowsheet data found.    Past Medical History:  Diagnosis Date  . Anemia   . Arthritis   . Complication of anesthesia    PONV  . Family history of adverse reaction to anesthesia    pt's mother stopped breathing possibly due to sleep apnea  . Migraine   . Obesity   . Simple ovarian cyst    Right   Past Surgical History:  Procedure Laterality Date  . ABDOMINOPLASTY/PANNICULECTOMY    . BREAST SURGERY  1998   reduction  . BREAST SURGERY  10/2013   Lift, tummy tuck  . NASAL SINUS SURGERY  1999  . 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 under arm skin removed    . uterine ablation  2012   Social History   Tobacco Use  . Smoking status: Never Smoker  . Smokeless tobacco: Never Used  Substance Use Topics  . Alcohol use: Yes    Alcohol/week: 0.6 oz    Types:  1 Standard drinks or equivalent per week    Comment: socially   family history includes Alcohol abuse in her paternal uncle; Arthritis in her brother, father, mother, and sister; Bladder Cancer in her mother; CVA in her father; Diabetes in her cousin; Heart disease in her maternal grandfather; Heart failure in her father; Hyperlipidemia in her father and mother; Hypertension in her brother, father, and mother; Kidney failure in her father; Lung cancer in her paternal grandmother; Migraines in her mother; Obesity in her brother and mother.    ROS: negative except as noted in the HPI  Medications: Current Outpatient Medications  Medication Sig Dispense Refill  . albuterol (PROVENTIL HFA;VENTOLIN HFA) 108 (90 Base) MCG/ACT inhaler Inhale 1-2 puffs into the lungs every 6 (six) hours as needed for wheezing or shortness of breath. 1 Inhaler 0  . AMBULATORY NON FORMULARY MEDICATION Medication Name: Optimal-V. Take 3 capsules 2 times daily with meals 180 capsule 11  . AMBULATORY NON FORMULARY MEDICATION Medication Name: Optimal-M. Take 2 capsules 2 times daily. 120 capsule 11  . AMBULATORY NON FORMULARY MEDICATION Medication Name: Magnical-D. Take  4 capsules daily, with meals. 120 capsule 11  . AMBULATORY NON FORMULARY MEDICATION Medication Name: Slenderiix. Take 0.56ml three times daily before meals. 2 oz 11  . AMBULATORY NON FORMULARY MEDICATION Medication Name: Alain Honey. Take 57ml three times daily before meals. 2 oz 11  . AMBULATORY NON FORMULARY MEDICATION Medication Name: PureNourish. Take one scoop daily in shake. 32 oz 11  . AMBULATORY NON FORMULARY MEDICATION Medication Name: PowerBoost. Take 1/2 scoop daily in shake. 14.2 oz 11  . calcium citrate-vitamin D 200-200 MG-UNIT TABS Take 1 tablet by mouth daily.      . Cholecalciferol (VITAMIN D3) 5000 units TABS Take 1 tablet (5,000 Units total) by mouth 2 (two) times daily. 90 tablet 1  . diclofenac sodium (VOLTAREN) 1 % GEL Apply 4 g topically 4  (four) times daily. 400 g 1  . diphenhydrAMINE (BENADRYL) 25 mg capsule Take 25 mg by mouth as needed.     . ferrous sulfate 325 (65 FE) MG tablet Take 325 mg by mouth daily with breakfast.    . Iron-FA-B Cmp-C-Biot-Probiotic (FUSION PLUS) CAPS Take 1 capsule by mouth daily. 90 capsule 1  . MAGNESIUM CITRATE PO Take 1 tablet by mouth 2 (two) times daily.     . Multiple Vitamin (MULTIVITAMIN) tablet Take 1 tablet by mouth daily.      . Riboflavin 400 MG TABS Take 1 tablet by mouth daily.    . SUMAtriptan (IMITREX) 100 MG tablet TAKE ONE TABLET BY MOUTH ONCE AS NEEDED FOR MIGRAINE 9 tablet 11  . topiramate (TOPAMAX) 50 MG tablet Take 3 tablets (150 mg total) by mouth at bedtime. 270 tablet 3  . traZODone (DESYREL) 100 MG tablet Take 1 tablet (100 mg total) by mouth at bedtime. 30 tablet 3   No current facility-administered medications for this visit.    Allergies  Allergen Reactions  . Sulfa Antibiotics Anaphylaxis  . Sulfamethoxazole-Trimethoprim Anaphylaxis, Swelling and Rash    Redness and swelling  . Bee Venom        Objective:  BP 100/66   Pulse (!) 59   Temp 98.2 F (36.8 C) (Oral)   Wt 218 lb (98.9 kg)   SpO2 98%   BMI 34.14 kg/m  Gen:  alert, not ill-appearing, no distress, appropriate for age, obese female HEENT: head normocephalic without obvious abnormality, conjunctiva and cornea clear, wearing glasses, oropharynx clear, moist mucous membranes, neck supple, no cervical adenopathy, trachea midline Pulm: Normal work of breathing, normal phonation, inspiratory wheeze in the upper lung fields, diffusely coarse expiratory breath sounds CV: Normal rate, regular rhythm, s1 and s2 distinct, no murmurs, clicks or rubs  Neuro: alert and oriented x 3, no tremor MSK: extremities atraumatic, normal gait and station Skin: intact, no rashes on exposed skin, no jaundice, no cyanosis   No results found for this or any previous visit (from the past 72 hour(s)). No results  found.    Assessment and Plan: 57 y.o. female with   Acute bronchitis - SpO2 98% on RA, no signs of respiratory distress. Will obtain CXR today due to adventitious lung sounds and recent travel to rule out CAP. Symptomatic care with steroid burst, bronchodilator, expectorant. Cough suppressant in the evenings. Counseled on risk of ulcer/GI bleed with Prednisone due to her Roux en Y procedure. She was counseled to consider risk, contact her bariatric surgeon and take with food if she decides to take it.  Acute bronchitis, unspecified organism - Plan: albuterol (PROVENTIL HFA;VENTOLIN HFA) 108 (90 Base) MCG/ACT inhaler,  DG Chest 2 View, predniSONE (DELTASONE) 50 MG tablet, guaiFENesin (MUCINEX) 600 MG 12 hr tablet, chlorpheniramine-HYDROcodone (TUSSIONEX) 10-8 MG/5ML SUER   Patient education and anticipatory guidance given Patient agrees with treatment plan Follow-up in 1 week or sooner as needed if symptoms worsen or fail to improve  Darlyne Russian PA-C

## 2018-01-30 NOTE — Progress Notes (Signed)
Hi Rose Long,  Your chest x-ray did not show any pneumonia. Treatment plan is the same.  Hope you feel better soon! Evlyn Clines

## 2018-01-30 NOTE — Telephone Encounter (Signed)
There is no alternative other than what I have already given her The steroids are not likely to alter the course of her bronchitis. It may help with symptoms, particularly wheezing. She can take it as long as she understands that there is some level of risk. I can't tell her with certainty that it's safe and she won't get an ulcer. I think she's unlikely to develop an ulcer, but she has to make that decision and weight the benefits and risks for herself

## 2018-01-30 NOTE — Telephone Encounter (Signed)
Pt stated that bariatric surgeon wouldn't advise her on what to take because its been over 8 years since she had the surgery.  She is wanting to know should she take the prednisone or should something else be Rx. Please advise. -EH/RMA

## 2018-01-30 NOTE — Patient Instructions (Addendum)
Plan: - chest x-ray today to assess for pneumonia - prednisone is a corticosteroid and there is a small risk of stomach ulcer, though it is much lower than the risk caused by NSAIDs. You can contact your bariatric surgeon to make sure it is okay for you to take this medicine for 5 days. Be sure to take with food! - Albuterol inhaler 1-2 puffs every 4 hours as needed for wheezing and bronchospasm. I would also take a dose first thing in the morning and before bedtime - Mucinex during that day to make cough more productive - Tussionex at bedtime as needed for cough     Acute Bronchitis, Adult Acute bronchitis is sudden (acute) swelling of the air tubes (bronchi) in the lungs. Acute bronchitis causes these tubes to fill with mucus, which can make it hard to breathe. It can also cause coughing or wheezing. In adults, acute bronchitis usually goes away within 2 weeks. A cough caused by bronchitis may last up to 3 weeks. Smoking, allergies, and asthma can make the condition worse. Repeated episodes of bronchitis may cause further lung problems, such as chronic obstructive pulmonary disease (COPD). What are the causes? This condition can be caused by germs and by substances that irritate the lungs, including:  Cold and flu viruses. This condition is most often caused by the same virus that causes a cold.  Bacteria.  Exposure to tobacco smoke, dust, fumes, and air pollution.  What increases the risk? This condition is more likely to develop in people who:  Have close contact with someone with acute bronchitis.  Are exposed to lung irritants, such as tobacco smoke, dust, fumes, and vapors.  Have a weak immune system.  Have a respiratory condition such as asthma.  What are the signs or symptoms? Symptoms of this condition include:  A cough.  Coughing up clear, yellow, or green mucus.  Wheezing.  Chest congestion.  Shortness of breath.  A fever.  Body aches.  Chills.  A sore  throat.  How is this diagnosed? This condition is usually diagnosed with a physical exam. During the exam, your health care provider may order tests, such as chest X-rays, to rule out other conditions. He or she may also:  Test a sample of your mucus for bacterial infection.  Check the level of oxygen in your blood. This is done to check for pneumonia.  Do a chest X-ray or lung function testing to rule out pneumonia and other conditions.  Perform blood tests.  Your health care provider will also ask about your symptoms and medical history. How is this treated? Most cases of acute bronchitis clear up over time without treatment. Your health care provider may recommend:  Drinking more fluids. Drinking more makes your mucus thinner, which may make it easier to breathe.  Taking a medicine for a fever or cough.  Taking an antibiotic medicine.  Using an inhaler to help improve shortness of breath and to control a cough.  Using a cool mist vaporizer or humidifier to make it easier to breathe.  Follow these instructions at home: Medicines  Take over-the-counter and prescription medicines only as told by your health care provider.  If you were prescribed an antibiotic, take it as told by your health care provider. Do not stop taking the antibiotic even if you start to feel better. General instructions  Get plenty of rest.  Drink enough fluids to keep your urine clear or pale yellow.  Avoid smoking and secondhand smoke. Exposure to  cigarette smoke or irritating chemicals will make bronchitis worse. If you smoke and you need help quitting, ask your health care provider. Quitting smoking will help your lungs heal faster.  Use an inhaler, cool mist vaporizer, or humidifier as told by your health care provider.  Keep all follow-up visits as told by your health care provider. This is important. How is this prevented? To lower your risk of getting this condition again:  Wash your hands  often with soap and water. If soap and water are not available, use hand sanitizer.  Avoid contact with people who have cold symptoms.  Try not to touch your hands to your mouth, nose, or eyes.  Make sure to get the flu shot every year.  Contact a health care provider if:  Your symptoms do not improve in 2 weeks of treatment. Get help right away if:  You cough up blood.  You have chest pain.  You have severe shortness of breath.  You become dehydrated.  You faint or keep feeling like you are going to faint.  You keep vomiting.  You have a severe headache.  Your fever or chills gets worse. This information is not intended to replace advice given to you by your health care provider. Make sure you discuss any questions you have with your health care provider. Document Released: 11/11/2004 Document Revised: 04/28/2016 Document Reviewed: 03/24/2016 Elsevier Interactive Patient Education  [employer].

## 2018-01-31 NOTE — Telephone Encounter (Signed)
Pt notified -EH/RMA  

## 2018-02-07 ENCOUNTER — Ambulatory Visit (INDEPENDENT_AMBULATORY_CARE_PROVIDER_SITE_OTHER): Payer: No Typology Code available for payment source | Admitting: Family Medicine

## 2018-02-07 ENCOUNTER — Encounter: Payer: Self-pay | Admitting: Family Medicine

## 2018-02-07 ENCOUNTER — Encounter: Payer: Self-pay | Admitting: Adult Health

## 2018-02-07 ENCOUNTER — Ambulatory Visit (INDEPENDENT_AMBULATORY_CARE_PROVIDER_SITE_OTHER): Payer: PRIVATE HEALTH INSURANCE | Admitting: Adult Health

## 2018-02-07 VITALS — BP 127/67 | HR 71 | Ht 67.0 in | Wt 218.0 lb

## 2018-02-07 VITALS — BP 135/83 | HR 61 | Ht 66.5 in | Wt 218.0 lb

## 2018-02-07 DIAGNOSIS — Z903 Acquired absence of stomach [part of]: Secondary | ICD-10-CM

## 2018-02-07 DIAGNOSIS — R5383 Other fatigue: Secondary | ICD-10-CM | POA: Diagnosis not present

## 2018-02-07 DIAGNOSIS — G43019 Migraine without aura, intractable, without status migrainosus: Secondary | ICD-10-CM | POA: Diagnosis not present

## 2018-02-07 DIAGNOSIS — J209 Acute bronchitis, unspecified: Secondary | ICD-10-CM | POA: Diagnosis not present

## 2018-02-07 DIAGNOSIS — Z980 Intestinal bypass and anastomosis status: Secondary | ICD-10-CM | POA: Diagnosis not present

## 2018-02-07 LAB — CBC
HEMATOCRIT: 38.9 % (ref 35.0–45.0)
Hemoglobin: 12.5 g/dL (ref 11.7–15.5)
MCH: 26.7 pg — AB (ref 27.0–33.0)
MCHC: 32.1 g/dL (ref 32.0–36.0)
MCV: 82.9 fL (ref 80.0–100.0)
MPV: 11.9 fL (ref 7.5–12.5)
Platelets: 351 10*3/uL (ref 140–400)
RBC: 4.69 10*6/uL (ref 3.80–5.10)
RDW: 12.5 % (ref 11.0–15.0)
WBC: 5.6 10*3/uL (ref 3.8–10.8)

## 2018-02-07 LAB — IRON: Iron: 78 ug/dL (ref 45–160)

## 2018-02-07 LAB — MAGNESIUM: Magnesium: 1.8 mg/dL (ref 1.5–2.5)

## 2018-02-07 MED ORDER — NORTRIPTYLINE HCL 10 MG PO CAPS: 10.0000 mg | ORAL_CAPSULE | Freq: Every day | ORAL | 5 refills | Status: DC

## 2018-02-07 NOTE — Patient Instructions (Addendum)
Your Plan:  Continue Topamax 150 mg at bedtime Start Nortriptyline 10 mg at  Bedtime If your symptoms worsen or you develop new symptoms please let us know.   Thank you for coming to see [place] at [facility]. I hope we have been able to provide you high quality care today.  You may receive a patient satisfaction survey over the next few weeks. We would appreciate your feedback and comments so that we may continue to improve ourselves and the health of our patients.  Nortriptyline capsules What is this medicine? NORTRIPTYLINE (nor TRIP ti leen) is used to treat depression. This medicine may be used for other purposes; ask your health care provider or pharmacist if you have questions. COMMON BRAND NAME(S): Aventyl, Pamelor What should I tell my health care provider before I take this medicine? They need to know if you have any of these conditions: -an alcohol problem -bipolar disorder or schizophrenia -difficulty passing urine, prostate trouble -glaucoma -heart disease or recent heart attack -liver disease -over active thyroid -seizures -thoughts or plans of suicide or a previous suicide attempt or family history of suicide attempt -an unusual or allergic reaction to nortriptyline, other medicines, foods, dyes, or preservatives -pregnant or trying to get pregnant -breast-feeding How should I use this medicine? Take this medicine by mouth with a glass of water. Follow the directions on the prescription label. Take your doses at regular intervals. Do not take it more often than directed. Do not stop taking this medicine suddenly except upon the advice of your doctor. Stopping this medicine too quickly may cause serious side effects or your condition may worsen. A special MedGuide will be given to you by the pharmacist with each prescription and refill. Be sure to read this information carefully each time. Talk to your pediatrician regarding the use of this medicine in  children. Special care may be needed. Overdosage: If you think you have taken too much of this medicine contact a poison control center or emergency room at once. NOTE: This medicine is only for you. Do not share this medicine with others. What if I miss a dose? If you miss a dose, take it as soon as you can. If it is almost time for your next dose, take only that dose. Do not take double or extra doses. What may interact with this medicine? Do not take this medicine with any of the following medications: -arsenic trioxide -certain medicines medicines for irregular heart beat -cisapride -halofantrine -linezolid -MAOIs like Carbex, Eldepryl, Marplan, Nardil, and Parnate -methylene blue (injected into a vein) -other medicines for mental depression -phenothiazines like perphenazine, thioridazine and chlorpromazine -pimozide -probucol -procarbazine -sparfloxacin -St. John's Wort -ziprasidone This medicine may also interact with any of the following medications: -atropine and related drugs like hyoscyamine, scopolamine, tolterodine and others -barbiturate medicines for inducing sleep or treating seizures, such as phenobarbital -cimetidine -medicines for diabetes -medicines for seizures like carbamazepine or phenytoin -reserpine -thyroid medicine This list may not describe all possible interactions. Give your health care provider a list of all the medicines, herbs, non-prescription drugs, or dietary supplements you use. Also tell them if you smoke, drink alcohol, or use illegal drugs. Some items may interact with your medicine. What should I watch for while using this medicine? Tell your doctor if your symptoms do not get better or if they get worse. Visit your doctor or health care professional for regular checks on your progress. Because it may take several weeks to see the full effects  of this medicine, it is important to continue your treatment as prescribed by your doctor. Patients  and their families should watch out for new or worsening thoughts of suicide or depression. Also watch out for sudden changes in feelings such as feeling anxious, agitated, panicky, irritable, hostile, aggressive, impulsive, severely restless, overly excited and hyperactive, or not being able to sleep. If this happens, especially at the beginning of treatment or after a change in dose, call your health care professional. Dennis Bast may get drowsy or dizzy. Do not drive, use machinery, or do anything that needs mental alertness until you know how this medicine affects you. Do not stand or sit up quickly, especially if you are an older patient. This reduces the risk of dizzy or fainting spells. Alcohol may interfere with the effect of this medicine. Avoid alcoholic drinks. Do not treat yourself for coughs, colds, or allergies without asking your doctor or health care professional for advice. Some ingredients can increase possible side effects. Your mouth may get dry. Chewing sugarless gum or sucking hard candy, and drinking plenty of water may help. Contact your doctor if the problem does not go away or is severe. This medicine may cause dry eyes and blurred vision. If you wear contact lenses you may feel some discomfort. Lubricating drops may help. See your eye doctor if the problem does not go away or is severe. This medicine can cause constipation. Try to have a bowel movement at least every 2 to 3 days. If you do not have a bowel movement for 3 days, call your doctor or health care professional. This medicine can make you more sensitive to the sun. Keep out of the sun. If you cannot avoid being in the sun, wear protective clothing and use sunscreen. Do not use sun lamps or tanning beds/booths. What side effects may I notice from receiving this medicine? Side effects that you should report to your doctor or health care professional as soon as possible: -allergic reactions like skin rash, itching or hives,  swelling of the face, lips, or tongue -anxious -breathing problems -changes in vision -confusion -elevated mood, decreased need for sleep, racing thoughts, impulsive behavior -eye pain -fast, irregular heartbeat -feeling faint or lightheaded, falls -feeling agitated, angry, or irritable -fever with increased sweating -hallucination, loss of contact with reality -seizures -stiff muscles -suicidal thoughts or other mood changes -tingling, pain, or numbness in the feet or hands -trouble passing urine or change in the amount of urine -trouble sleeping -unusually weak or tired -vomiting -yellowing of the eyes or skin Side effects that usually do not require medical attention (report to your doctor or health care professional if they continue or are bothersome): -change in sex drive or performance -change in appetite or weight -constipation -dizziness -dry mouth -nausea -tired -tremors -upset stomach This list may not describe all possible side effects. Call your doctor for medical advice about side effects. You may report side effects to FDA at 1-800-FDA-1088. Where should I keep my medicine? Keep out of the reach of children. Store at room temperature between 15 and 30 degrees C (59 and 86 degrees F). Keep container tightly closed. Throw away any unused medicine after the expiration date. NOTE: This sheet is a summary. It may not cover all possible information. If you have questions about this medicine, talk to your doctor, pharmacist, or health care provider.  2018 Elsevier/Gold Standard (2016-03-05 12:53:08)

## 2018-02-07 NOTE — Progress Notes (Signed)
PATIENT: Rose Long DOB: [birth year]  REASON FOR VISIT: follow up HISTORY FROM: patient  HISTORY OF PRESENT ILLNESS: Today 02/07/18 Rose Long is a 57 year old female with a history of migraine headaches.  She returns today for follow-up.  She is currently on Topamax 100 mg at bedtime.  At the last visit she was placed on trazodone.  She has stopped this medication as she felt that it increased her migraine headaches.  She reports that she is having approximately  5 headaches a week.  She is been taking Imitrex.  She reports  she cuts her Imitrex in half so that does not run out by the end of the month.  She reports photophobia and phonophobia as well as nausea but no vomiting.  She reports tingling in the fingertips but this could be a side effect of Topamax.  In the past she has tried Flexeril, Fioricet and Ultram.  She reports that she had gastric bypass surgery 8 years ago.  She returns today for evaluation.  HISTORY Rose Long is a 57 year old left-handed white female with a history of migraine headaches since age 42.  The patient indicates that the weather changes and sleep deprivation are big activators for her headache.  She is having 1 or 2 headaches a week currently.  She is on 100 mg Topamax at night, she tolerates this well.  She takes 200 mg of Benadryl at night for sleep, she does this on a chronic basis.  The patient has been on trazodone previously, but she believes that she was only on 50 mg at night and it did not help her.  The patient takes Imitrex if needed for her migraines with good benefit.  She returns to the office today for an evaluation.    REVIEW OF SYSTEMS: Out of a complete 14 system review of symptoms, the patient complains only of the following symptoms, and all other reviewed systems are negative.  See HPI  ALLERGIES: Allergies  Allergen Reactions  . Sulfa Antibiotics Anaphylaxis  . Sulfamethoxazole-Trimethoprim Anaphylaxis, Swelling and Rash   Redness and swelling  . Bee Venom     HOME MEDICATIONS: Outpatient Medications Prior to Visit  Medication Sig Dispense Refill  . albuterol (PROVENTIL HFA;VENTOLIN HFA) 108 (90 Base) MCG/ACT inhaler Inhale 1-2 puffs into the lungs every 4 (four) hours as needed for wheezing or shortness of breath. 1 Inhaler 0  . AMBULATORY NON FORMULARY MEDICATION Medication Name: Optimal-V. Take 3 capsules 2 times daily with meals 180 capsule 11  . AMBULATORY NON FORMULARY MEDICATION Medication Name: Optimal-M. Take 2 capsules 2 times daily. 120 capsule 11  . AMBULATORY NON FORMULARY MEDICATION Medication Name: Magnical-D. Take 4 capsules daily, with meals. 120 capsule 11  . AMBULATORY NON FORMULARY MEDICATION Medication Name: Slenderiix. Take 0.70ml three times daily before meals. 2 oz 11  . AMBULATORY NON FORMULARY MEDICATION Medication Name: Alain Honey. Take 28ml three times daily before meals. 2 oz 11  . AMBULATORY NON FORMULARY MEDICATION Medication Name: PureNourish. Take one scoop daily in shake. 32 oz 11  . AMBULATORY NON FORMULARY MEDICATION Medication Name: PowerBoost. Take 1/2 scoop daily in shake. 14.2 oz 11  . calcium citrate-vitamin D 200-200 MG-UNIT TABS Take 1 tablet by mouth daily.      . Cholecalciferol (VITAMIN D3) 5000 units TABS Take 1 tablet (5,000 Units total) by mouth 2 (two) times daily. 90 tablet 1  . diclofenac sodium (VOLTAREN) 1 % GEL Apply 4 g topically 4 (four) times daily. 400 g  1  . diphenhydrAMINE (BENADRYL) 25 mg capsule Take 25 mg by mouth as needed.     . ferrous sulfate 325 (65 FE) MG tablet Take 325 mg by mouth daily with breakfast.    . guaiFENesin (MUCINEX) 600 MG 12 hr tablet Take 2 tablets (1,200 mg total) by mouth 2 (two) times daily.    . Iron-FA-B Cmp-C-Biot-Probiotic (FUSION PLUS) CAPS Take 1 capsule by mouth daily. 90 capsule 1  . MAGNESIUM CITRATE PO Take 1 tablet by mouth 2 (two) times daily.     . Multiple Vitamin (MULTIVITAMIN) tablet Take 1 tablet by mouth daily.       . Riboflavin 400 MG TABS Take 1 tablet by mouth daily.    . SUMAtriptan (IMITREX) 100 MG tablet TAKE ONE TABLET BY MOUTH ONCE AS NEEDED FOR MIGRAINE 9 tablet 11  . topiramate (TOPAMAX) 50 MG tablet Take 3 tablets (150 mg total) by mouth at bedtime. 270 tablet 3  . traZODone (DESYREL) 100 MG tablet Take 1 tablet (100 mg total) by mouth at bedtime. 30 tablet 3   No facility-administered medications prior to visit.     PAST MEDICAL HISTORY: Past Medical History:  Diagnosis Date  . Anemia   . Arthritis   . Complication of anesthesia    PONV  . Family history of adverse reaction to anesthesia    pt's mother stopped breathing possibly due to sleep apnea  . Migraine   . Obesity   . Simple ovarian cyst    Right    PAST SURGICAL HISTORY: Past Surgical History:  Procedure Laterality Date  . ABDOMINOPLASTY/PANNICULECTOMY    . BREAST SURGERY  1998   reduction  . BREAST SURGERY  10/2013   Lift, tummy tuck  . NASAL SINUS SURGERY  1999  . 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 under arm skin removed    . uterine ablation  2012    FAMILY HISTORY: Family History  Problem Relation Age of Onset  . Hypertension Mother   . Bladder Cancer Mother   . Hyperlipidemia Mother   . Migraines Mother   . Arthritis Mother   . Obesity Mother   . Hypertension Father   . CVA Father   . Heart failure Father   . Arthritis Father   . Hyperlipidemia Father   . Kidney failure Father   . Heart disease Maternal Grandfather   . Lung cancer Paternal Grandmother        lung  . Alcohol abuse Paternal Uncle   . Diabetes Cousin   . Arthritis Sister   . Hypertension Brother   . Arthritis Brother   . Obesity Brother     SOCIAL HISTORY: Social History   Socioeconomic History  . Marital status: Married    Spouse name: Not on file    . Number of children: 0  . Years of education: Masters  . Highest education level: Not on file  Occupational History  . Occupation: Secondary school teacher: [employer]  Social Needs  . Financial resource strain: Not on file  . Food insecurity:    Worry: Not on file    Inability: Not on file  . Transportation needs:    Medical: Not on file    Non-medical: Not on file  Tobacco Use  .  Smoking status: Never Smoker  . Smokeless tobacco: Never Used  Substance and Sexual Activity  . Alcohol use: Yes    Alcohol/week: 0.6 oz    Types: 1 Standard drinks or equivalent per week    Comment: socially  . Drug use: No  . Sexual activity: Not Currently  Lifestyle  . Physical activity:    Days per week: Not on file    Minutes per session: Not on file  . Stress: Not on file  Relationships  . Social connections:    Talks on phone: Not on file    Gets together: Not on file    Attends religious service: Not on file    Active member of club or organization: Not on file    Attends meetings of clubs or organizations: Not on file    Relationship status: Not on file  . Intimate partner violence:    Fear of current or ex partner: Not on file    Emotionally abused: Not on file    Physically abused: Not on file    Forced sexual activity: Not on file  Other Topics Concern  . Not on file  Social History Narrative   Parents moved into her home on June 2013.  2 caffeine drinks per day.  Works out 1 hour 5-6 days per week.    Left-handed      PHYSICAL EXAM  Vitals:   02/07/18 1424  BP: 135/83  Pulse: 61  Weight: 218 lb (98.9 kg)  Height: 5' 6.5" (1.689 m)   Body mass index is 34.66 kg/m.  Generalized: Well developed, in no acute distress   Neurological examination  Mentation: Alert oriented to time, place, history taking. Follows all commands speech and language fluent Cranial nerve II-XII: Pupils were equal round reactive to  light. Extraocular movements were full, visual field were full on confrontational test. Facial sensation and strength were normal. Uvula tongue midline. Head turning and shoulder shrug  were normal and symmetric. Motor: The motor testing reveals 5 over 5 strength of all 4 extremities. Good symmetric motor tone is noted throughout.  Sensory: Sensory testing is intact to soft touch on all 4 extremities. No evidence of extinction is noted.  Coordination: Cerebellar testing reveals good finger-nose-finger and heel-to-shin bilaterally.  Gait and station: Gait is normal. Tandem gait is normal. Romberg is negative. No drift is seen.  Reflexes: Deep tendon reflexes are symmetric and normal bilaterally.   DIAGNOSTIC DATA (LABS, IMAGING, TESTING) - I reviewed patient records, labs, notes, testing and imaging myself where available.  Lab Results  Component Value Date   WBC 8.3 09/19/2015   HGB 12.9 07/19/2016   HCT 39.2 09/19/2015   MCV 84.3 09/19/2015   PLT 324 09/19/2015      Component Value Date/Time   NA 139 08/26/2016 1545   K 4.0 08/26/2016 1545   CL 104 08/26/2016 1545   CO2 27 08/26/2016 1545   GLUCOSE 84 08/26/2016 1545   BUN 10 08/26/2016 1545   CREATININE 0.61 08/26/2016 1545   CALCIUM 9.1 08/26/2016 1545   PROT 6.7 06/02/2015 1053   ALBUMIN 3.6 06/02/2015 1053   AST 23 06/02/2015 1053   ALT 24 06/02/2015 1053   ALKPHOS 70 06/02/2015 1053   BILITOT 0.4 06/02/2015 1053   GFRNONAA >60 09/19/2015 1532   GFRNONAA >89 06/02/2015 1053   GFRAA >60 09/19/2015 1532   GFRAA >89 06/02/2015 1053   Lab Results  Component Value Date   CHOL 161 08/05/2014   HDL  60 08/05/2014   LDLCALC 86 08/05/2014   TRIG 76 08/05/2014   CHOLHDL 2.7 08/05/2014   No results found for: HGBA1C Lab Results  Component Value Date   VITAMINB12 481 06/02/2015   Lab Results  Component Value Date   TSH 2.311 11/29/2012      ASSESSMENT AND PLAN 57 y.o. year old female  has a past medical history of  Anemia, Arthritis, Complication of anesthesia, Family history of adverse reaction to anesthesia, Migraine, Obesity, and Simple ovarian cyst. here with:  1.  Migraine headache  The patient will continue on Topamax 50 mg at bedtime.  We will start the patient on nortriptyline 10 mg at bedtime.  I reviewed potential side effects with the patient and provided her with a handout.  She denies any cardiac history or abnormal rhythms.  She is advised that if her headache frequency does not improve she should let us know.  She will follow-up in 6 months or sooner if needed.   I spent 15 minutes with the patient. 50% of this time was spent discussing new medication-nortriptyline.   Ward Givens, MSN, NP-C 02/07/2018, 2:22 PM [facility] [address] [place], [place] [address] [phone]

## 2018-02-07 NOTE — Progress Notes (Signed)
I have read the note, and I agree with the clinical assessment and plan.  Geraldean Walen K Davide Risdon   

## 2018-02-07 NOTE — Progress Notes (Signed)
   Subjective:    Patient ID: Rose Long, female    DOB: [birth year], 57 y.o.   MRN: [id number]  HPI 57 year old female comes in today to follow-up for bronchitis.  She was seen on April 15 by my partner Sherlie Ban.  She had a negative chest x-ray and was treated expectorant, cough suppressant, and bronchodilator.  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  She is actually feeling significantly better.  She still has some residual cough with a little bit of mucus but overall feeling much better.  No shortness of breath.  No fever.  She is also requesting to have her iron levels checked.  She is been more tired than usual and has been sleeping more.  She is not sure if is just recovering from the bronchitis or she might actually be iron deficient again.  Years ago it was severe enough that she ended up having an iron infusion.  She admits she has not been consistent about her iron supplement.  Review of Systems      Objective:   Physical Exam  Constitutional: She is oriented to person, place, and time. She appears well-developed and well-nourished.  HENT:  Head: Normocephalic and atraumatic.  Right Ear: External ear normal.  Left Ear: External ear normal.  Nose: Nose normal.  Mouth/Throat: Oropharynx is clear and moist.  TMs and canals are clear.   Eyes: Pupils are equal, round, and reactive to light. Conjunctivae and EOM are normal.  Neck: Neck supple. No thyromegaly present.  Cardiovascular: Normal rate, regular rhythm and normal heart sounds.  Pulmonary/Chest: Effort normal and breath sounds normal. She has no wheezes.  Lymphadenopathy:    She has no cervical adenopathy.  Neurological: She is alert and oriented to person, place, and time.  Skin: Skin is warm and dry.  Psychiatric: She has a normal mood and affect.        Assessment & Plan:  Bronchitis-lungs are completely clear on exam today.  She still has some residual cough but  overall is feeling much better.  If suddenly feeling worse or short of breath and please give [place] a call back but I think she will continue to improve over the next 1 to 2 weeks.  Fatigue/increased sleep-we will check for iron deficiency anemia.  Also check B12, folate, and magnesium level as well.  She prefers to do liquid iron.  He did make these over-the-counter.  She will check with [facility].

## 2018-02-08 LAB — FERRITIN: FERRITIN: 6 ng/mL — AB (ref 10–232)

## 2018-02-08 LAB — B12 AND FOLATE PANEL
Folate: 10.1 ng/mL
VITAMIN B 12: 439 pg/mL (ref 200–1100)

## 2018-04-08 IMAGING — MR MR HEAD WO/W CM
10 series · 46 of 48 positions shown · IV contrast (multihance)
Comparison: Cervical spine MRI 01/22/2012.

CLINICAL DATA: 55-year-old female with intractable persistent daily
headaches. Initial encounter.

EXAM:
MRI HEAD WITHOUT AND WITH CONTRAST
TECHNIQUE: Multiplanar, multiecho pulse sequences of the brain and surrounding
structures were obtained without and with intravenous contrast.
CONTRAST:  19mL MULTIHANCE GADOBENATE DIMEGLUMINE 529 MG/ML IV SOLN

[Series 2: T1 · sagittal · 5.0mm · 0.45mm/px · 2 of 23 slices shown (1 of 2)]
[im 1/23]
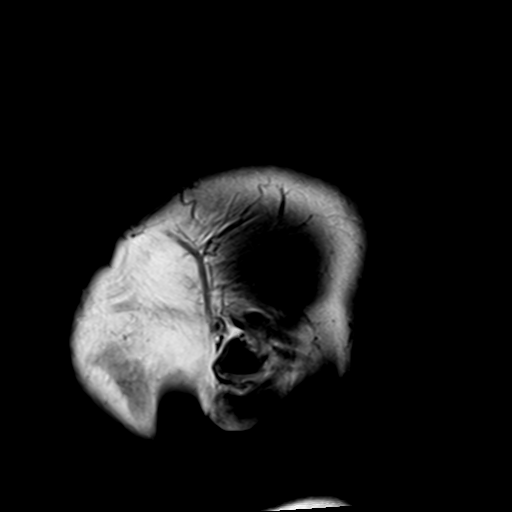
[im 23/23]
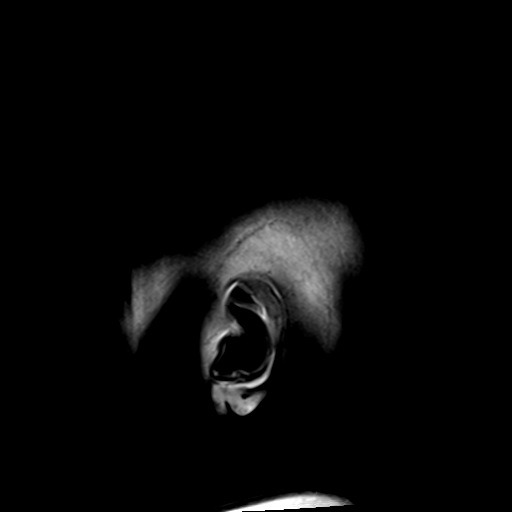

[Series 4: DWI · axial · 3.0mm · 1.20mm/px · z∈[-55,+99]mm · 6 of 55 slices shown (1 of 2)]
[im 1/55]
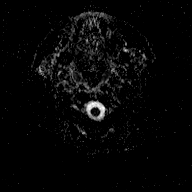
[im 11/55]
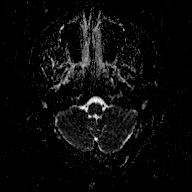
[im 22/55]
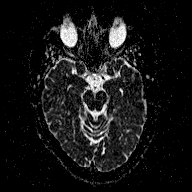
[im 33/55]
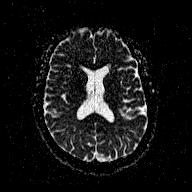
[im 44/55]
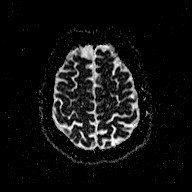
[im 55/55]
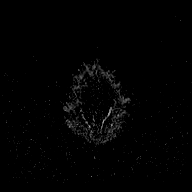

[Series 5: T2 · axial · 5.0mm · 0.72mm/px · z∈[-55,+100]mm · 3 of 26 slices shown (1 of 2)]
[im 1/26]
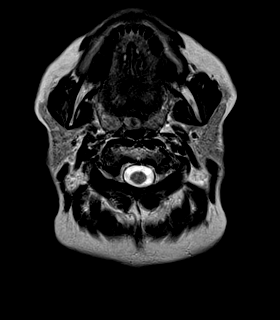
[im 13/26]
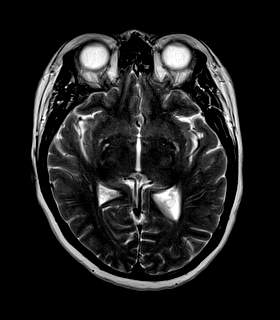
[im 26/26]
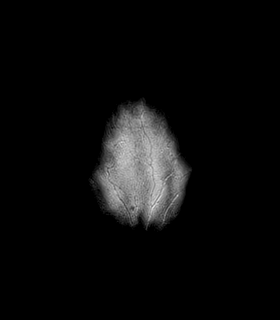

[Series 6: FLAIR · axial · 5.0mm · 0.45mm/px · z∈[-55,+100]mm · 3 of 26 slices shown]
[im 1/26]
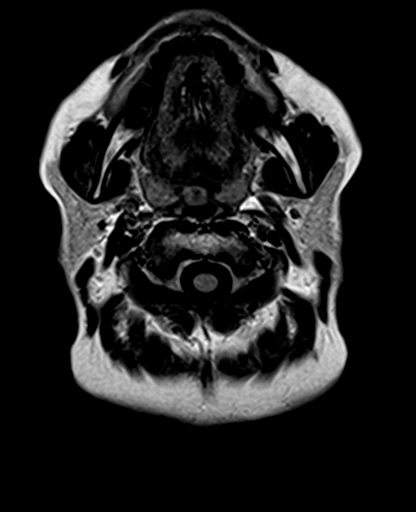
[im 13/26]
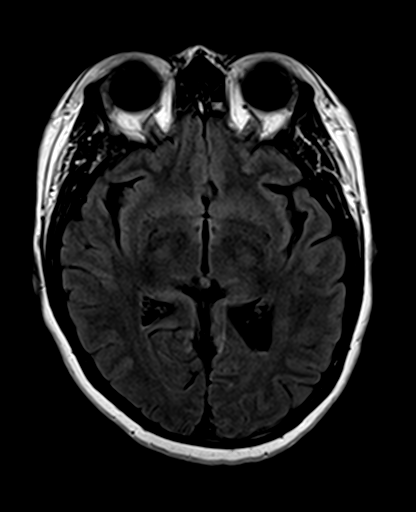
[im 26/26]
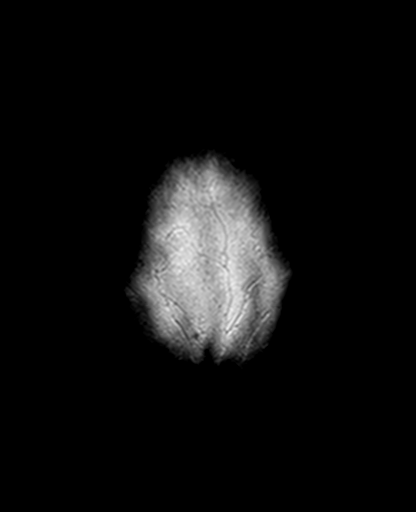

[Series 7: T2 · axial · 5.0mm · 0.72mm/px · z∈[-55,+100]mm · 3 of 26 slices shown (2 of 2)]
[im 1/26]
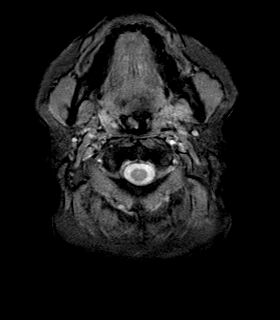
[im 13/26]
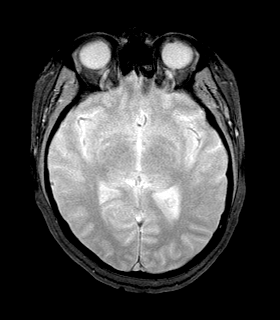
[im 26/26]
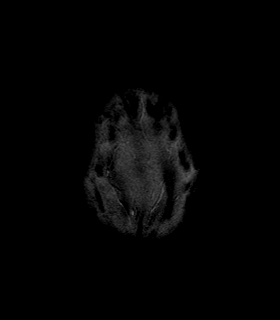

[Series 8: T1 · axial · 3.0mm · 1.00mm/px · z∈[-63,+65]mm · 6 of 64 slices shown (2 of 2)]
[im 1/64]
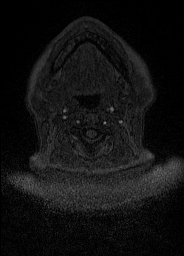
[im 10/64]
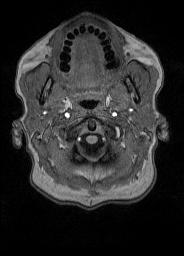
[im 19/64]
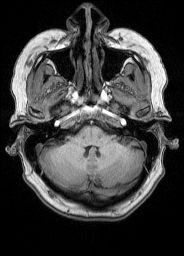
[im 28/64]
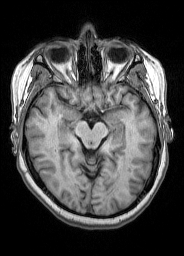
[im 37/64]
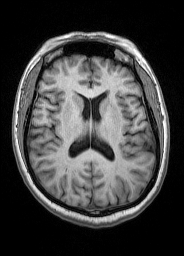
[im 46/64]
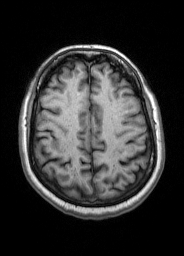

[Series 9: T2 post-contrast · coronal · 5.0mm · 0.45mm/px · 4 of 29 slices shown]
[im 1/29]
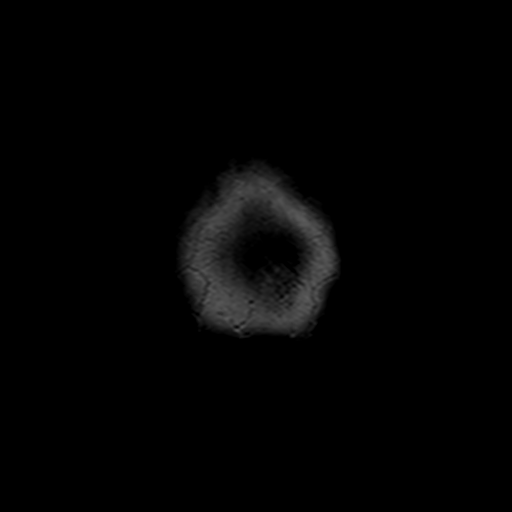
[im 10/29]
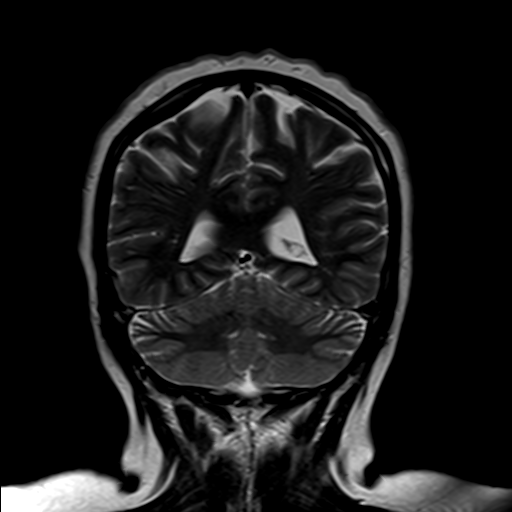
[im 19/29]
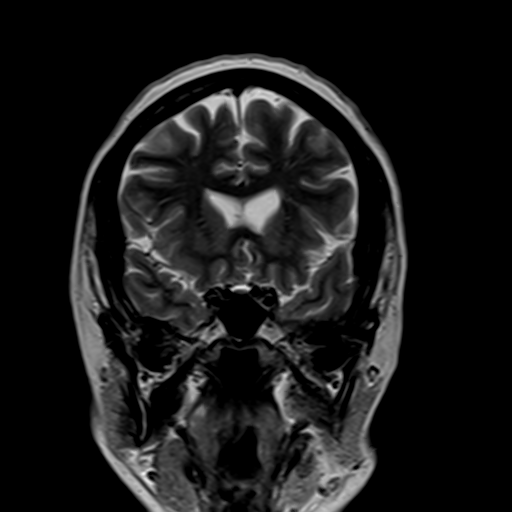
[im 29/29]
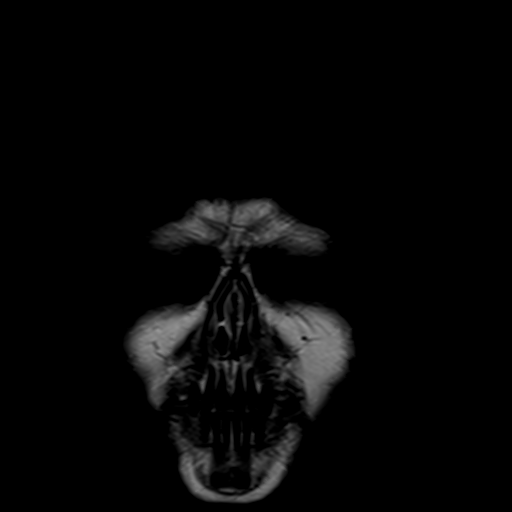

[Series 10: T1 post-contrast · axial · 3.0mm · 1.00mm/px · z∈[-63,+116]mm · 8 of 64 slices shown (1 of 2)]
[im 1/64]
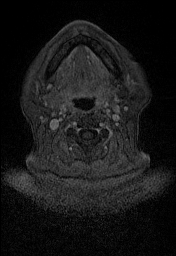
[im 10/64]
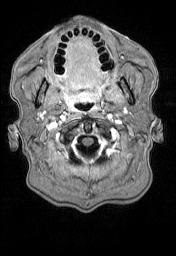
[im 19/64]
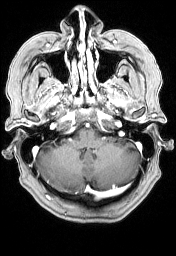
[im 28/64]
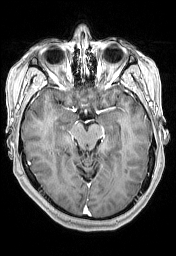
[im 37/64]
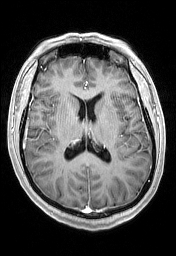
[im 46/64]
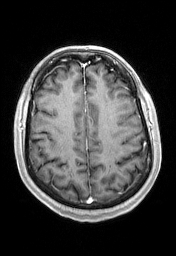
[im 55/64]
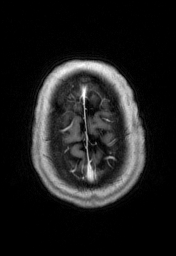
[im 64/64]
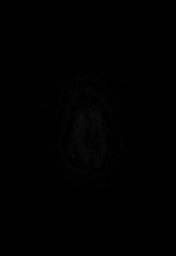

[Series 11: T1 post-contrast · coronal · 5.0mm · 0.45mm/px · 4 of 29 slices shown (2 of 2)]
[im 1/29]
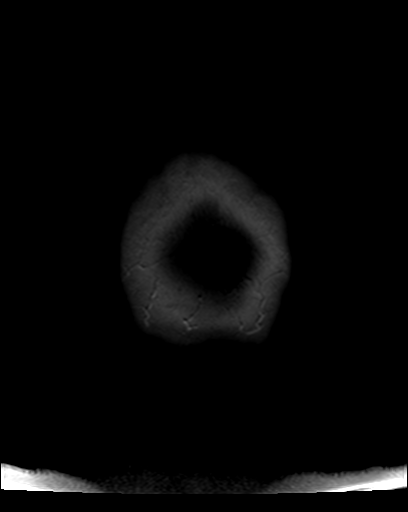
[im 10/29]
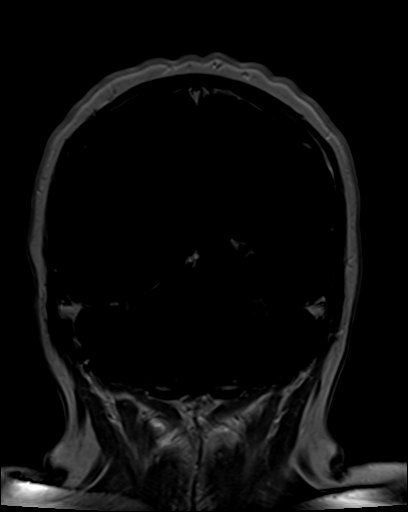
[im 19/29]
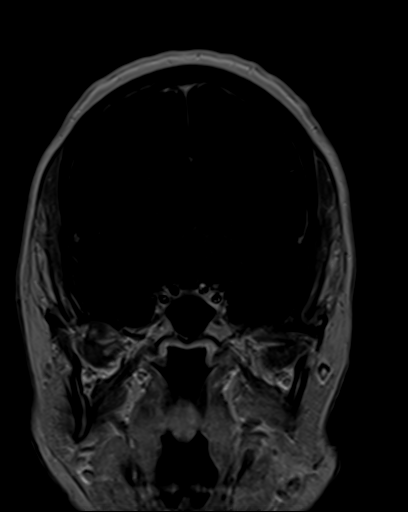
[im 29/29]
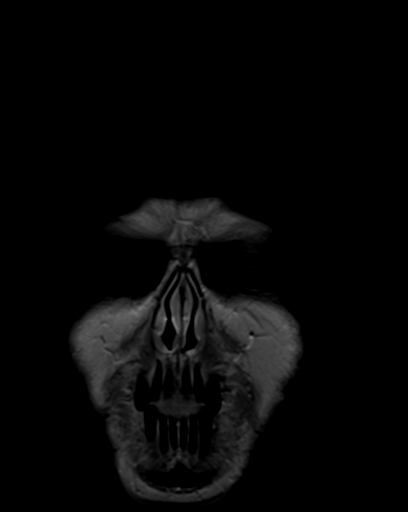

[Series 100: DWI · axial · 3.0mm · 1.20mm/px · z∈[-55,+99]mm · 7 of 55 slices shown (2 of 2)]
[im 1/55]
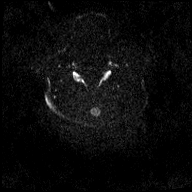
[im 10/55]
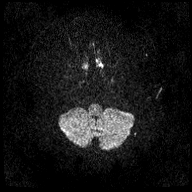
[im 19/55]
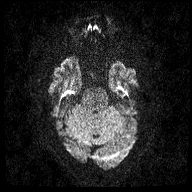
[im 28/55]
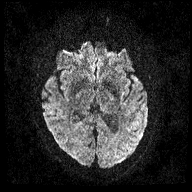
[im 37/55]
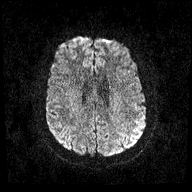
[im 46/55]
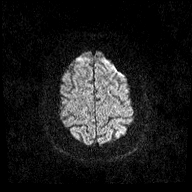
[im 55/55]
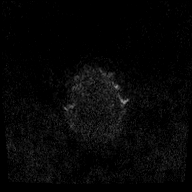

[46 of 48 positions shown; findings below may reference images not displayed]

FINDINGS: Brain: No restricted diffusion to suggest acute infarction. No
midline shift, mass effect, evidence of mass lesion,
ventriculomegaly, extra-axial collection or acute intracranial
hemorrhage. Cervicomedullary junction and pituitary are within
normal limits. Cerebral volume is normal.

Scattered small nonspecific foci of cerebral white matter T2 and
FLAIR hyperintensity. The extent is minimal to mild for age. No
cortical encephalomalacia. No definite chronic cerebral blood
products. Deep gray matter nuclei, brainstem, and cerebellum are
within normal limits. No abnormal enhancement identified. No dural
thickening.

Vascular: Major intracranial vascular flow voids appear normal.

Skull and upper cervical spine: Stable since 5749 with mild upper
cervical spine degeneration. Visualized bone marrow signal is within
normal limits.

Sinuses/Orbits: Normal orbits soft tissues. Trace left ethmoid sinus
mucosal thickening. Other paranasal sinuses and mastoids are clear.

Other: Visible internal auditory structures appear normal. Negative
scalp soft tissues.
IMPRESSION: Largely unremarkable for age MRI appearance of the brain.

Minimal to mild nonspecific white matter signal changes,
significance doubtful.

## 2018-06-26 ENCOUNTER — Ambulatory Visit (INDEPENDENT_AMBULATORY_CARE_PROVIDER_SITE_OTHER): Payer: No Typology Code available for payment source | Admitting: Family Medicine

## 2018-06-26 ENCOUNTER — Telehealth: Payer: Self-pay | Admitting: *Deleted

## 2018-06-26 ENCOUNTER — Encounter: Payer: Self-pay | Admitting: Family Medicine

## 2018-06-26 VITALS — BP 131/74 | HR 77 | Ht 67.0 in | Wt 223.0 lb

## 2018-06-26 DIAGNOSIS — F418 Other specified anxiety disorders: Secondary | ICD-10-CM | POA: Diagnosis not present

## 2018-06-26 DIAGNOSIS — Z23 Encounter for immunization: Secondary | ICD-10-CM

## 2018-06-26 MED ORDER — SERTRALINE HCL 50 MG PO TABS: ORAL_TABLET | ORAL | 1 refills | Status: DC

## 2018-06-26 NOTE — Patient Instructions (Signed)
Please remember to schedule your mammogram

## 2018-06-26 NOTE — Progress Notes (Addendum)
Subjective:    Patient ID: Rose Long, female    DOB: 01-Nov-1960, 57 y.o.   MRN: [id number]  HPI   56 year old female comes in today complaining of difficulty with focus and inattention. She feels like she gets anxious around her co-workers and feeling like under a microscope at work.  For about the last year she has been under a new boss.  And just feels like she is constantly being scrutinized.  She said coworkers complain about her taking vacation.  And they have also complained about how she gets her work done but she always gets it done.  She is just starting to feel very anxious about the whole situation.  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  At first she started to wonder if it was more difficulty with focus and inattention.  But she spoke to friends and family as well as some previous coworkers who reassured her that they had never noticed any difficulty with her ability to focus and concentrate.  Denies feeling down depressed or hopeless.  She would like to consider possibly a medication for anxiety just to see if it might be helpful with her situation.  He does have a sister with ADHD but says she can tell there is a major difference between the 2 of them.  Is also traveling to the [place] for a missions trip in November and wants to know which vaccines she may need for her travel.  Review of Systems  BP 131/74   Pulse 77   Ht 5\' 7"  (1.702 m)   Wt 223 lb (101.2 kg)   SpO2 98%   BMI 34.93 kg/m     Allergies  Allergen Reactions  . Sulfa Antibiotics Anaphylaxis  . Sulfamethoxazole-Trimethoprim Anaphylaxis, Swelling and Rash    Redness and swelling  . Bee Venom   . Trazodone And Nefazodone     headache    Past Medical History:  Diagnosis Date  . Anemia   . Arthritis   . Bronchitis 2019  . Complication of anesthesia    PONV  . Family history of adverse reaction to anesthesia     pt's mother stopped breathing possibly due to sleep apnea  . Migraine   . Obesity   . Simple ovarian cyst    Right    Past Surgical History:  Procedure Laterality Date  . ABDOMINOPLASTY/PANNICULECTOMY    . BREAST SURGERY  1998   reduction  . BREAST SURGERY  10/2013   Lift, tummy tuck  . NASAL SINUS SURGERY  1999  . 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 under arm skin removed    . uterine ablation  2012    Social History   Socioeconomic History  . Marital status: Married    Spouse name: Not on file  . Number of children: 0  . Years of education: Masters  . Highest education level: Not on file  Occupational History    Comment: [employer]  Social Needs  . Financial resource strain: Not on file  . Food insecurity:    Worry: Not on file    Inability: Not on file  . Transportation needs:    Medical: Not on file    Non-medical: Not on file  Tobacco  Use  . Smoking status: Never Smoker  . Smokeless tobacco: Never Used  Substance and Sexual Activity  . Alcohol use: Yes    Alcohol/week: 1.0 standard drinks    Types: 1 Standard drinks or equivalent per week    Comment: socially  . Drug use: No  . Sexual activity: Not Currently  Lifestyle  . Physical activity:    Days per week: Not on file    Minutes per session: Not on file  . Stress: Not on file  Relationships  . Social connections:    Talks on phone: Not on file    Gets together: Not on file    Attends religious service: Not on file    Active member of club or organization: Not on file    Attends meetings of clubs or organizations: Not on file    Relationship status: Not on file  . Intimate partner violence:    Fear of current or ex partner: Not on file    Emotionally abused: Not on file    Physically abused: Not on file    Forced sexual  activity: Not on file  Other Topics Concern  . Not on file  Social History Narrative   Parents moved into her home on June 2013. Lives with her spouse.  2 caffeine drinks per day.  Takes a 30 minute walk 7 days a week. Left-handed    Family History  Problem Relation Age of Onset  . Hypertension Mother   . Bladder Cancer Mother   . Hyperlipidemia Mother   . Migraines Mother   . Arthritis Mother   . Obesity Mother   . Hypertension Father   . CVA Father   . Heart failure Father   . Arthritis Father   . Hyperlipidemia Father   . Kidney failure Father   . Heart disease Maternal Grandfather   . Lung cancer Paternal Grandmother        lung  . Alcohol abuse Paternal Uncle   . Diabetes Cousin   . Arthritis Sister   . Hypertension Brother   . Arthritis Brother   . Obesity Brother     Outpatient Encounter Medications as of 06/26/2018  Medication Sig  . albuterol (PROVENTIL HFA;VENTOLIN HFA) 108 (90 Base) MCG/ACT inhaler Inhale 1-2 puffs into the lungs every 4 (four) hours as needed for wheezing or shortness of breath.  . calcium citrate-vitamin D 200-200 MG-UNIT TABS Take 1 tablet by mouth daily.    . Cholecalciferol (VITAMIN D3) 5000 units TABS Take 1 tablet (5,000 Units total) by mouth 2 (two) times daily.  . diclofenac sodium (VOLTAREN) 1 % GEL Apply 4 g topically 4 (four) times daily.  . diphenhydrAMINE (BENADRYL) 25 mg capsule Take 25 mg by mouth as needed.   . ferrous sulfate 325 (65 FE) MG tablet Take 325 mg by mouth daily with breakfast.  . MAGNESIUM CITRATE PO Take 1 tablet by mouth 2 (two) times daily.   . Multiple Vitamin (MULTIVITAMIN) tablet Take 1 tablet by mouth daily.    . nortriptyline (PAMELOR) 10 MG capsule Take 1 capsule (10 mg total) by mouth at bedtime.  . Riboflavin 400 MG TABS Take 1 tablet by mouth daily.  . SUMAtriptan (IMITREX) 100 MG tablet TAKE ONE TABLET BY MOUTH ONCE AS NEEDED FOR MIGRAINE  . topiramate (TOPAMAX) 50 MG tablet Take 3 tablets (150 mg  total) by mouth at bedtime.  . sertraline (ZOLOFT) 50 MG tablet 1/2 tab po QD x 6 days, then increase to  whole tab daily  . [DISCONTINUED] AMBULATORY NON FORMULARY MEDICATION Medication Name: Optimal-V. Take 3 capsules 2 times daily with meals (Patient not taking: Reported on 02/07/2018)  . [DISCONTINUED] AMBULATORY NON FORMULARY MEDICATION Medication Name: Optimal-M. Take 2 capsules 2 times daily. (Patient not taking: Reported on 02/07/2018)  . [DISCONTINUED] AMBULATORY NON FORMULARY MEDICATION Medication Name: Magnical-D. Take 4 capsules daily, with meals. (Patient not taking: Reported on 02/07/2018)  . [DISCONTINUED] AMBULATORY NON FORMULARY MEDICATION Medication Name: Slenderiix. Take 0.24ml three times daily before meals. (Patient not taking: Reported on 02/07/2018)  . [DISCONTINUED] AMBULATORY NON FORMULARY MEDICATION Medication Name: Alain Honey. Take 62ml three times daily before meals. (Patient not taking: Reported on 02/07/2018)  . [DISCONTINUED] AMBULATORY NON FORMULARY MEDICATION Medication Name: PureNourish. Take one scoop daily in shake. (Patient not taking: Reported on 02/07/2018)  . [DISCONTINUED] AMBULATORY NON FORMULARY MEDICATION Medication Name: PowerBoost. Take 1/2 scoop daily in shake. (Patient not taking: Reported on 02/07/2018)  . [DISCONTINUED] guaiFENesin (MUCINEX) 600 MG 12 hr tablet Take 2 tablets (1,200 mg total) by mouth 2 (two) times daily.  . [DISCONTINUED] Iron-FA-B Cmp-C-Biot-Probiotic (FUSION PLUS) CAPS Take 1 capsule by mouth daily. (Patient not taking: Reported on 02/07/2018)  . [DISCONTINUED] traZODone (DESYREL) 100 MG tablet Take 1 tablet (100 mg total) by mouth at bedtime. (Patient not taking: Reported on 02/07/2018)   No facility-administered encounter medications on file as of 06/26/2018.         Objective:   Physical Exam  Constitutional: She is oriented to person, place, and time. She appears well-developed and well-nourished.  HENT:  Head: Normocephalic and  atraumatic.  Eyes: Conjunctivae and EOM are normal.  Cardiovascular: Normal rate.  Pulmonary/Chest: Effort normal.  Neurological: She is alert and oriented to person, place, and time.  Skin: Skin is dry. No pallor.  Psychiatric: She has a normal mood and affect. Her behavior is normal.  Vitals reviewed.       Assessment & Plan:  Anxiety-discussed options.  Will start with low-dose sertraline and work her way up.  I think it certainly worth trying to see if she feels like it may be helpful.  I do think unfortunately part of this is just the situation of the job position.  She would really like to stay in the position for 1 more year if at all possible.  Follow-up in 3 to 4 weeks.  That way we can see how she is doing and adjust the medication if need be.  Certainly she has any problems she can give [place] a call back.  Travel counseling/advised-recommend to start the Twinrix series.  Recommend expedited series starting with 0, 7, 21 days and then a booster at 12 months.  She will also need typhoid vaccination and malaria prophylaxis.

## 2018-06-26 NOTE — Telephone Encounter (Signed)
lvm advising pt to call and schedule an appt w/nurse to start Twinrix series before her trip. Rose Long, [name], CMA

## 2018-07-03 ENCOUNTER — Ambulatory Visit (INDEPENDENT_AMBULATORY_CARE_PROVIDER_SITE_OTHER): Payer: No Typology Code available for payment source | Admitting: Family Medicine

## 2018-07-03 VITALS — BP 133/80 | HR 58 | Temp 98.5°F | Wt 225.0 lb

## 2018-07-03 DIAGNOSIS — Z23 Encounter for immunization: Secondary | ICD-10-CM | POA: Diagnosis not present

## 2018-07-03 MED ORDER — ATOVAQUONE-PROGUANIL HCL 250-100 MG PO TABS: 1.0000 | ORAL_TABLET | Freq: Every day | ORAL | 0 refills | Status: DC

## 2018-07-03 MED ORDER — TYPHOID VACCINE PO CPDR: 1.0000 | DELAYED_RELEASE_CAPSULE | ORAL | 0 refills | Status: DC

## 2018-07-03 NOTE — Progress Notes (Signed)
Pt presents to office today for first Twinrix immunization. Pt traveling to [place] in November. At [place] she was advised the following by Dr Madilyn Fireman: "Travel counseling/advised-recommend to start the Twinrix series.  Recommend expedited series starting with 0, 7, 21 days and then a booster at 12 months.  She will also need typhoid vaccination and malaria prophylaxis"  First Twinrix administered in left deltoid, pt tolerated well. Pt advised to go ahead and schedule the 7 day, 21 day, and 1 year booster for Twinrix.   Malarone pended, please review and send if appropriate. Please advise on Typhoid.

## 2018-07-03 NOTE — Progress Notes (Signed)
Pt advised about malaria and typhoid.

## 2018-07-03 NOTE — Progress Notes (Signed)
Agree with documentation as above.  Options sent over for typhoid vaccine.  She can actually go ahead and start that now and needs to finish it at least a week before she travels but I would encourage her to just go ahead and take it now.  In the malaria prophylaxis was sent she will actually start that one day before travel and then continue during her travels and then continue for 1 more week after she returns to the states.  Beatrice Lecher, MD

## 2018-07-10 ENCOUNTER — Ambulatory Visit (INDEPENDENT_AMBULATORY_CARE_PROVIDER_SITE_OTHER): Payer: PRIVATE HEALTH INSURANCE | Admitting: Family Medicine

## 2018-07-10 VITALS — Temp 98.4°F

## 2018-07-10 DIAGNOSIS — Z23 Encounter for immunization: Secondary | ICD-10-CM | POA: Diagnosis not present

## 2018-07-10 NOTE — Progress Notes (Signed)
Pt presents to office for second Twinrix vaccine.   No side effects reported from first vaccine.   Pt tolerated vaccine well with no immediate complications.

## 2018-07-10 NOTE — Progress Notes (Signed)
Agree with documentation as above.   Marlissa Emerick, MD  

## 2018-07-24 ENCOUNTER — Ambulatory Visit (INDEPENDENT_AMBULATORY_CARE_PROVIDER_SITE_OTHER): Payer: PRIVATE HEALTH INSURANCE | Admitting: Family Medicine

## 2018-07-24 ENCOUNTER — Encounter: Payer: Self-pay | Admitting: Family Medicine

## 2018-07-24 VITALS — BP 120/64 | HR 64 | Ht 67.0 in | Wt 220.0 lb

## 2018-07-24 DIAGNOSIS — Z23 Encounter for immunization: Secondary | ICD-10-CM | POA: Diagnosis not present

## 2018-07-24 DIAGNOSIS — G43019 Migraine without aura, intractable, without status migrainosus: Secondary | ICD-10-CM | POA: Diagnosis not present

## 2018-07-24 DIAGNOSIS — F418 Other specified anxiety disorders: Secondary | ICD-10-CM

## 2018-07-24 MED ORDER — SERTRALINE HCL 100 MG PO TABS: 100.0000 mg | ORAL_TABLET | Freq: Every day | ORAL | 0 refills | Status: DC

## 2018-07-24 NOTE — Patient Instructions (Signed)
Will need a booster Twinrix in one year.

## 2018-07-24 NOTE — Progress Notes (Signed)
   Subjective:    Patient ID: Rose Long, female    DOB: 25-Sep-1961, 57 y.o.   MRN: [id number]  HPI Here for follow-up situational anxiety-recently started her on Zoloft.  She is up to 50 mg a day and doing well.  Has not noticed any significant problems or side effects with the medication and says she thinks she would actually like to go up on her dose just a little bit.  Follow-up migraine headaches-she also wanted to let me know that she has been seeing Dr. Domingo Cocking [facility] and they have made some adjustments to her medication regimen. She is off her nortryptiline now.  She is really been working on making dietary changes, eating healthy and exercising to better control her headaches.   Review of Systems     Objective:   Physical Exam  Constitutional: She is oriented to person, place, and time. She appears well-developed and well-nourished.  HENT:  Head: Normocephalic and atraumatic.  Cardiovascular: Normal rate, regular rhythm and normal heart sounds.  Pulmonary/Chest: Effort normal and breath sounds normal.  Neurological: She is alert and oriented to person, place, and time.  Skin: Skin is warm and dry.  Psychiatric: She has a normal mood and affect. Her behavior is normal.          Assessment & Plan:  Situational anxiety-doing well on low-dose sertraline.  Happy with her regimen but interested in increasing her dose.  Now that she is off the nortriptyline we can push her dose to 100 mg.  Follow-up in 6 weeks.  Ongoing headaches-updated her chart with correct medications and she is now seeing Dr[facility] Domingo [facility]ing [facility].  Travel counseling - given 3rd Twinrix today.

## 2018-07-25 ENCOUNTER — Other Ambulatory Visit: Payer: Self-pay | Admitting: Family Medicine

## 2018-07-25 DIAGNOSIS — Z1231 Encounter for screening mammogram for malignant neoplasm of breast: Secondary | ICD-10-CM

## 2018-07-28 ENCOUNTER — Ambulatory Visit (INDEPENDENT_AMBULATORY_CARE_PROVIDER_SITE_OTHER): Payer: PRIVATE HEALTH INSURANCE

## 2018-07-28 DIAGNOSIS — Z1231 Encounter for screening mammogram for malignant neoplasm of breast: Secondary | ICD-10-CM | POA: Diagnosis not present

## 2018-08-16 ENCOUNTER — Ambulatory Visit: Payer: PRIVATE HEALTH INSURANCE | Admitting: Adult Health

## 2018-08-24 ENCOUNTER — Telehealth: Payer: Self-pay

## 2018-08-24 MED ORDER — CIPROFLOXACIN HCL 500 MG PO TABS: 500.0000 mg | ORAL_TABLET | Freq: Two times a day (BID) | ORAL | 0 refills | Status: AC

## 2018-08-24 NOTE — Telephone Encounter (Signed)
Pt is going on a missions trip to [place] and someone who went last time contracted a virus. The team that is traveling has been advised to take an emergency RX of Cipro with them in case of sickness.  Pt wanting to know if this can be sent to pharmacy for her to have in case of emergency.   Please advise

## 2018-08-24 NOTE — Telephone Encounter (Signed)
Pt advised.

## 2018-08-24 NOTE — Telephone Encounter (Signed)
New rx sent

## 2018-09-08 ENCOUNTER — Ambulatory Visit: Payer: PRIVATE HEALTH INSURANCE | Admitting: Family Medicine

## 2018-09-11 ENCOUNTER — Encounter (HOSPITAL_COMMUNITY): Payer: Self-pay

## 2018-09-12 ENCOUNTER — Ambulatory Visit (INDEPENDENT_AMBULATORY_CARE_PROVIDER_SITE_OTHER): Payer: PRIVATE HEALTH INSURANCE | Admitting: Family Medicine

## 2018-09-12 ENCOUNTER — Encounter: Payer: Self-pay | Admitting: Family Medicine

## 2018-09-12 VITALS — BP 135/73 | HR 58 | Ht 67.32 in | Wt 211.0 lb

## 2018-09-12 DIAGNOSIS — R630 Anorexia: Secondary | ICD-10-CM | POA: Diagnosis not present

## 2018-09-12 DIAGNOSIS — R05 Cough: Secondary | ICD-10-CM

## 2018-09-12 DIAGNOSIS — R634 Abnormal weight loss: Secondary | ICD-10-CM

## 2018-09-12 DIAGNOSIS — R059 Cough, unspecified: Secondary | ICD-10-CM

## 2018-09-12 NOTE — Progress Notes (Signed)
Acute Office Visit  Subjective:    Patient ID: Rose Long, female    DOB: [birth year], 57 y.o.   MRN: [id number]  Chief Complaint  Patient presents with  . abnormal weight loss    loss of appetite, abdominal discomfort, no pain,diarrhea,nausea,vomiting, she reports feeling tired and fatigued, she has had some respiratory issues of wheezing and mucous. she said that she started feeling better on yesterday. no one else in the group has reported to be sick    HPI Patient is in today for abnormal weight loss that started on 09/03/18.  She recently got back from [place]  Since she has been home she has had decreased appetite.  She actually had 1 day where she ran a fever but says that has not happened since then that was on Friday night.  She has had some upper respiratory symptoms.  He has had a cough and wheezing.  She says is actually been a little bit better the last 2 days.  And she is been losing on average about a pound a day.  Infectious lost 7 pounds in 1 week.  She says her stomach has felt a little bit upset but she is not had any actual vomiting or diarrhea.  She just feels extremely fatigued.  In fact over the weekend she slept almost 17 hours straight. She finished her malaria medication on Sunday.  She is down 9 lbs since the last time here.    She does have some joint aches but says they are not different from her baseline.  No joint swelling.  She has not had more headaches in the last week than usual.  Past Medical History:  Diagnosis Date  . Anemia   . Arthritis   . Bronchitis 2019  . Complication of anesthesia    PONV  . Family history of adverse reaction to anesthesia    pt's mother stopped breathing possibly due to sleep apnea  . Migraine   . Obesity   . Simple ovarian cyst    Right    Past Surgical History:  Procedure Laterality Date  . ABDOMINOPLASTY/PANNICULECTOMY    . BREAST SURGERY  1998   reduction  . BREAST  SURGERY  10/2013   Lift, tummy tuck  . NASAL SINUS SURGERY  1999  . REDUCTION MAMMAPLASTY  1998  . REDUCTION MAMMAPLASTY  2017   Breast lift  . 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 under arm skin removed    . uterine ablation  2012    Family History  Problem Relation Age of Onset  . Hypertension Mother   . Bladder Cancer Mother   . Hyperlipidemia Mother   . Migraines Mother   . Arthritis Mother   . Obesity Mother   . Hypertension Father   . CVA Father   . Heart failure Father   . Arthritis Father   . Hyperlipidemia Father   . Kidney failure Father   . Heart disease Maternal Grandfather   . Lung cancer Paternal Grandmother        lung  . Alcohol abuse Paternal Uncle   . Diabetes Cousin   . Arthritis Sister   . Hypertension Brother   . Arthritis Brother   . Obesity Brother     Social History   Socioeconomic  History  . Marital status: Married    Spouse name: Not on file  . Number of children: 0  . Years of education: Masters  . Highest education level: Not on file  Occupational History    Comment: [employer]  Social Needs  . Financial resource strain: Not on file  . Food insecurity:    Worry: Not on file    Inability: Not on file  . Transportation needs:    Medical: Not on file    Non-medical: Not on file  Tobacco Use  . Smoking status: Never Smoker  . Smokeless tobacco: Never Used  Substance and Sexual Activity  . Alcohol use: Yes    Alcohol/week: 1.0 standard drinks    Types: 1 Standard drinks or equivalent per week    Comment: socially  . Drug use: No  . Sexual activity: Not Currently  Lifestyle  . Physical activity:    Days per week: Not on file    Minutes per session: Not on file  . Stress: Not on file  Relationships  . Social connections:    Talks on phone: Not on file     Gets together: Not on file    Attends religious service: Not on file    Active member of club or organization: Not on file    Attends meetings of clubs or organizations: Not on file    Relationship status: Not on file  . Intimate partner violence:    Fear of current or ex partner: Not on file    Emotionally abused: Not on file    Physically abused: Not on file    Forced sexual activity: Not on file  Other Topics Concern  . Not on file  Social History Narrative   Parents moved into her home on June 2013. Lives with her spouse.  2 caffeine drinks per day.  Takes a 30 minute walk 7 days a week. Left-handed    Outpatient Medications Prior to Visit  Medication Sig Dispense Refill  . albuterol (PROVENTIL HFA;VENTOLIN HFA) 108 (90 Base) MCG/ACT inhaler Inhale 1-2 puffs into the lungs every 4 (four) hours as needed for wheezing or shortness of breath. 1 Inhaler 0  . baclofen (LIORESAL) 10 MG tablet Take 10 mg by mouth 2 (two) times daily as needed.  0  . calcium citrate-vitamin D 200-200 MG-UNIT TABS Take 1 tablet by mouth daily.      . Cholecalciferol (VITAMIN D3) 5000 units TABS Take 1 tablet (5,000 Units total) by mouth 2 (two) times daily. 90 tablet 1  . diclofenac sodium (VOLTAREN) 1 % GEL Apply 4 g topically 4 (four) times daily. 400 g 1  . diphenhydrAMINE (BENADRYL) 25 mg capsule Take 25 mg by mouth as needed.     . ferrous sulfate 325 (65 FE) MG tablet Take 325 mg by mouth daily with breakfast.    . MAGNESIUM CITRATE PO Take 1 tablet by mouth 2 (two) times daily.     . Multiple Vitamin (MULTIVITAMIN) tablet Take 1 tablet by mouth daily.      . sertraline (ZOLOFT) 100 MG tablet Take 1 tablet (100 mg total) by mouth daily. 90 tablet 0  . zonisamide (ZONEGRAN) 25 MG capsule Take 25 mg by mouth daily.  1  . Riboflavin 400 MG TABS Take 1 tablet by mouth daily.     No facility-administered medications prior to visit.     Allergies  Allergen Reactions  . Sulfa Antibiotics Anaphylaxis  .  Sulfamethoxazole-Trimethoprim Anaphylaxis, Swelling and  Rash    Redness and swelling  . Bee Venom   . Trazodone And Nefazodone     headache    ROS     Objective:    Physical Exam  Constitutional: She is oriented to person, place, and time. She appears well-developed and well-nourished.  HENT:  Head: Normocephalic and atraumatic.  Right Ear: External ear normal.  Left Ear: External ear normal.  Nose: Nose normal.  Mouth/Throat: Oropharynx is clear and moist.  TMs and canals are clear.   Eyes: Pupils are equal, round, and reactive to light. Conjunctivae and EOM are normal.  Neck: Neck supple. No thyromegaly present.  Cardiovascular: Normal rate, regular rhythm and normal heart sounds.  Pulmonary/Chest: Effort normal and breath sounds normal. She has no wheezes.  Lymphadenopathy:    She has no cervical adenopathy.  Neurological: She is alert and oriented to person, place, and time.  Skin: Skin is warm and dry.  Psychiatric: She has a normal mood and affect.    BP 135/73   Pulse (!) 58   Ht 5' 7.32" (1.71 m)   Wt 211 lb (95.7 kg)   SpO2 99%   BMI 32.73 kg/m  Wt Readings from Last 3 Encounters:  09/12/18 211 lb (95.7 kg)  07/24/18 220 lb (99.8 kg)  07/03/18 225 lb (102.1 kg)    There are no preventive care reminders to display for this patient.  There are no preventive care reminders to display for this patient.   Lab Results  Component Value Date   TSH 2.311 11/29/2012   Lab Results  Component Value Date   WBC 5.6 02/07/2018   HGB 12.5 02/07/2018   HCT 38.9 02/07/2018   MCV 82.9 02/07/2018   PLT 351 02/07/2018   Lab Results  Component Value Date   NA 139 08/26/2016   K 4.0 08/26/2016   CO2 27 08/26/2016   GLUCOSE 84 08/26/2016   BUN 10 08/26/2016   CREATININE 0.61 08/26/2016   BILITOT 0.4 06/02/2015   ALKPHOS 70 06/02/2015   AST 23 06/02/2015   ALT 24 06/02/2015   PROT 6.7 06/02/2015   ALBUMIN 3.6 06/02/2015   CALCIUM 9.1 08/26/2016   ANIONGAP  9 09/19/2015   Lab Results  Component Value Date   CHOL 161 08/05/2014   Lab Results  Component Value Date   HDL 60 08/05/2014   Lab Results  Component Value Date   LDLCALC 86 08/05/2014   Lab Results  Component Value Date   TRIG 76 08/05/2014   Lab Results  Component Value Date   CHOLHDL 2.7 08/05/2014   No results found for: HGBA1C     Assessment & Plan:   Problem List Items Addressed This Visit    None    Visit Diagnoses    Abnormal weight loss    -  Primary   Relevant Orders   CBC with Differential/Platelet   COMPLETE METABOLIC PANEL WITH GFR   Loss of appetite       Relevant Orders   CBC with Differential/Platelet   COMPLETE METABOLIC PANEL WITH GFR   Cough       Relevant Orders   CBC with Differential/Platelet   COMPLETE METABOLIC PANEL WITH GFR     Unclear etiology at this point.  I really suspect some type of upper viral illness but I am concerned about the brisk weight loss.  Though it does seem like it may have leveled out over the last 24 hours we discussed a couple of  options including just doing an initial minimal work-up with a CBC and CMP.  And if in the next 24 to 48 hours she really does continue to feel better and her weight stabilizes then no further work-up needed.  Her lung exam was clear so we held off on chest x-ray today.  We discussed that if she continues to progress then we may need further work-up to evaluate for the possibility of malaria, dengue, and Chikungunya.  No orders of the defined types were placed in this encounter.    Beatrice Lecher, MD

## 2018-09-13 LAB — CBC WITH DIFFERENTIAL/PLATELET
BASOS ABS: 51 {cells}/uL (ref 0–200)
BASOS PCT: 0.9 %
Eosinophils Absolute: 97 cells/uL (ref 15–500)
Eosinophils Relative: 1.7 %
HCT: 40.9 % (ref 35.0–45.0)
HEMOGLOBIN: 13.6 g/dL (ref 11.7–15.5)
Lymphs Abs: 2166 cells/uL (ref 850–3900)
MCH: 28.3 pg (ref 27.0–33.0)
MCHC: 33.3 g/dL (ref 32.0–36.0)
MCV: 85.2 fL (ref 80.0–100.0)
MPV: 11.4 fL (ref 7.5–12.5)
Monocytes Relative: 7.5 %
NEUTROS ABS: 2958 {cells}/uL (ref 1500–7800)
Neutrophils Relative %: 51.9 %
Platelets: 382 10*3/uL (ref 140–400)
RBC: 4.8 10*6/uL (ref 3.80–5.10)
RDW: 12.4 % (ref 11.0–15.0)
TOTAL LYMPHOCYTE: 38 %
WBC: 5.7 10*3/uL (ref 3.8–10.8)
WBCMIX: 428 {cells}/uL (ref 200–950)

## 2018-09-13 LAB — COMPLETE METABOLIC PANEL WITH GFR
AG Ratio: 1.3 (calc) (ref 1.0–2.5)
ALBUMIN MSPROF: 3.9 g/dL (ref 3.6–5.1)
ALKALINE PHOSPHATASE (APISO): 79 U/L (ref 33–130)
ALT: 19 U/L (ref 6–29)
AST: 20 U/L (ref 10–35)
BUN: 11 mg/dL (ref 7–25)
CALCIUM: 9.6 mg/dL (ref 8.6–10.4)
CO2: 28 mmol/L (ref 20–32)
CREATININE: 0.84 mg/dL (ref 0.50–1.05)
Chloride: 106 mmol/L (ref 98–110)
GFR, Est African American: 90 mL/min/{1.73_m2} (ref >=60)
GFR, Est Non African American: 78 mL/min/{1.73_m2} (ref >=60)
GLUCOSE: 88 mg/dL (ref 65–99)
Globulin: 3.1 g/dL (calc) (ref 1.9–3.7)
Potassium: 4.1 mmol/L (ref 3.5–5.3)
Sodium: 141 mmol/L (ref 135–146)
Total Bilirubin: 0.3 mg/dL (ref 0.2–1.2)
Total Protein: 7 g/dL (ref 6.1–8.1)

## 2018-09-28 ENCOUNTER — Encounter: Payer: Self-pay | Admitting: Family Medicine

## 2018-09-28 ENCOUNTER — Ambulatory Visit (INDEPENDENT_AMBULATORY_CARE_PROVIDER_SITE_OTHER): Payer: PRIVATE HEALTH INSURANCE | Admitting: Family Medicine

## 2018-09-28 VITALS — BP 118/72 | HR 71 | Temp 97.9°F | Ht 67.0 in | Wt 213.0 lb

## 2018-09-28 DIAGNOSIS — F439 Reaction to severe stress, unspecified: Secondary | ICD-10-CM

## 2018-09-28 DIAGNOSIS — F418 Other specified anxiety disorders: Secondary | ICD-10-CM

## 2018-09-28 MED ORDER — SERTRALINE HCL 100 MG PO TABS: 100.0000 mg | ORAL_TABLET | Freq: Every day | ORAL | 1 refills | Status: DC

## 2018-09-28 NOTE — Progress Notes (Signed)
Subjective:     CC: F/U mood medication.    HPI: F/U situational anxiety -he is actually doing really well on the Zoloft.  We had increased the dose when I last saw her 200 mg.  She feels like it is working well and is happy with her regimen.  She has not noted any concerning or significant side effects.  Work continues to be a little bit stressful.   Objective:    General: Well Developed, well nourished, and in no acute distress.  Neuro: Alert and oriented x3, extra-ocular muscles intact, sensation grossly intact.  HEENT: Normocephalic, atraumatic  Skin: Warm and dry, no rashes. Cardiac: Regular rate and rhythm, no murmurs rubs or gallops, no lower extremity edema.  Respiratory: Clear to auscultation bilaterally. Not using accessory muscles, speaking in full sentences.   Impression and Recommendations:    Situational anxiety -well controlled currently on Zoloft 100 mg daily.  New current regimen and follow-up in 3 to 4 months depending on how well she is doing.

## 2018-11-17 LAB — HM COLONOSCOPY

## 2018-11-17 MED FILL — HEMMOREX-HC 25 MG SUPP: 25 | 7 days supply | Qty: 7 | Fill #0

## 2018-12-29 ENCOUNTER — Ambulatory Visit (INDEPENDENT_AMBULATORY_CARE_PROVIDER_SITE_OTHER): Payer: PRIVATE HEALTH INSURANCE | Admitting: Family Medicine

## 2018-12-29 ENCOUNTER — Other Ambulatory Visit: Payer: Self-pay

## 2018-12-29 ENCOUNTER — Encounter: Payer: Self-pay | Admitting: Family Medicine

## 2018-12-29 VITALS — BP 136/78 | HR 54 | Ht 67.0 in

## 2018-12-29 DIAGNOSIS — F418 Other specified anxiety disorders: Secondary | ICD-10-CM

## 2018-12-29 DIAGNOSIS — Z9884 Bariatric surgery status: Secondary | ICD-10-CM

## 2018-12-29 DIAGNOSIS — M25562 Pain in left knee: Secondary | ICD-10-CM

## 2018-12-29 DIAGNOSIS — G43019 Migraine without aura, intractable, without status migrainosus: Secondary | ICD-10-CM | POA: Diagnosis not present

## 2018-12-29 NOTE — Progress Notes (Signed)
Subjective:    CC: 3 mo mood check   HPI:  Follow-up situational anxiety-overall she is doing fantastic on 200 mg of sertraline daily.  She is getting ready to change jobs so we will continue with her current regimen she denies any concerning side effects.  Follow-up migraine headaches-she also is doing well in regards to her headaches in fact she is doing much better than she has in quite a while.  She recently switched providers and is now [facility] and is seeing Dr. Orie Rout.  They have been doing trigger point injections and she has made some dietary changes.  She is now off the Imitrex and any opioid type pain medication she is mostly just relying on the zonisamide and the occasional baclofen and that actually seems to be working really well for her.  She went from 20+ headaches per month down to just a few.  She is also been having some left knee pain she says is not so much with sitting or with actually walking it is more when she is changing position she thinks is at the point where she might need an injection.  She has seen Dr. Dianah Field previously for other musculoskeletal injuries.  She did follow-up with Dr. Milford Cage the bariatric surgeon she had not been there in several years.  She saw him in December and got a good checkup but he did recommend that on the screening labs that we order yearly for her to make sure that we are checking a thiamine level.  Past medical history, Surgical history, Family history not pertinant except as noted below, Social history, Allergies, and medications have been entered into the medical record, reviewed, and corrections made.   Review of Systems: No fevers, chills, night sweats, weight loss, chest pain, or shortness of breath.   Objective:    General: Well Developed, well nourished, and in no acute distress.  Neuro: Alert and oriented x3, extra-ocular muscles intact, sensation grossly intact.  HEENT:  Normocephalic, atraumatic  Skin: Warm and dry, no rashes. Cardiac: Regular rate and rhythm, no murmurs rubs or gallops, no lower extremity edema.  Respiratory: Clear to auscultation bilaterally. Not using accessory muscles, speaking in full sentences.   Impression and Recommendations:    Anxiety-actually doing really well on current regimen continue with Zoloft daily.  I will see her back in 4 to 6 months.  Migraine headaches-now following with Dr. Domingo Cocking at the [facility] and she has been doing really fantastic with her migraine control.  Status post bariatric surgery-will get updated lab panel sometime next month.  I did go ahead and print it and put it in the computer system today.  Left knee pain-recommend that she get back in with Dr. Dianah Field early next week.

## 2019-01-02 ENCOUNTER — Other Ambulatory Visit: Payer: Self-pay

## 2019-01-02 ENCOUNTER — Ambulatory Visit (INDEPENDENT_AMBULATORY_CARE_PROVIDER_SITE_OTHER): Payer: PRIVATE HEALTH INSURANCE

## 2019-01-02 ENCOUNTER — Ambulatory Visit (INDEPENDENT_AMBULATORY_CARE_PROVIDER_SITE_OTHER): Payer: PRIVATE HEALTH INSURANCE | Admitting: Sports Medicine

## 2019-01-02 ENCOUNTER — Encounter: Payer: Self-pay | Admitting: Sports Medicine

## 2019-01-02 DIAGNOSIS — M25461 Effusion, right knee: Secondary | ICD-10-CM

## 2019-01-02 DIAGNOSIS — M25562 Pain in left knee: Secondary | ICD-10-CM

## 2019-01-02 DIAGNOSIS — M1711 Unilateral primary osteoarthritis, right knee: Secondary | ICD-10-CM | POA: Diagnosis not present

## 2019-01-02 DIAGNOSIS — Z96652 Presence of left artificial knee joint: Secondary | ICD-10-CM | POA: Diagnosis not present

## 2019-01-02 MED ORDER — DICLOFENAC SODIUM 2 % TD SOLN: 2.0000 | Freq: Two times a day (BID) | TRANSDERMAL | 11 refills | Status: DC

## 2019-01-02 MED ORDER — ACETAMINOPHEN ER 650 MG PO TBCR: 650.0000 mg | EXTENDED_RELEASE_TABLET | Freq: Three times a day (TID) | ORAL | 3 refills | Status: DC | PRN

## 2019-01-02 NOTE — Assessment & Plan Note (Signed)
Severe lateral compartment osteoarthritis. History of gastric bypass we cannot use oral NSAIDs. Unloader brace placed today. Need updated x-rays. Adding arthritis strength Tylenol and topical Pennsaid. Return to see me in a month, we will do an injection if no better. She wants to do anything possible to avoid knee arthroplasty.

## 2019-01-02 NOTE — Progress Notes (Signed)
Subjective:    I'm seeing this patient as a consultation for: Dr. Beatrice Lecher  CC: Right knee pain  HPI: This is a pleasant 58 year old female, she is post left total knee arthroplasty.  For the past several months she is had worsening pain in her right knee, lateral joint line, moderate, persistent, localized without radiation, no trauma, no mechanical symptoms.  She is unable to use NSAIDs due to gastric bypass.  I reviewed the past medical history, family history, social history, surgical history, and allergies today and no changes were needed.  Please see the problem list section below in epic for further details.  Past Medical History: Past Medical History:  Diagnosis Date  . Anemia   . Arthritis   . Bronchitis 2019  . Complication of anesthesia    PONV  . Family history of adverse reaction to anesthesia    pt's mother stopped breathing possibly due to sleep apnea  . Migraine   . Obesity   . Simple ovarian cyst    Right   Past Surgical History: Past Surgical History:  Procedure Laterality Date  . ABDOMINOPLASTY/PANNICULECTOMY    . BREAST SURGERY  1998   reduction  . BREAST SURGERY  10/2013   Lift, tummy tuck  . NASAL SINUS SURGERY  1999  . REDUCTION MAMMAPLASTY  1998  . REDUCTION MAMMAPLASTY  2017   Breast lift  . 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 TOTAL KNEE ARTHROPLASTY Left 09/30/2015   Procedure: TOTAL KNEE ARTHROPLASTY;  Surgeon: Melrose Nakayama, MD;  Location: [facility];  Service: Orthopedics;  Laterality: Left;  . under arm skin removed    . uterine ablation  2012   Social History: Social History   Socioeconomic History  . Marital status: Married    Spouse name: Not on file  . Number of children: 0  . Years of education: Masters  . Highest education level: Not on file  Occupational History    Comment: [employer]  Social Needs  . Financial resource strain: Not on file  . Food insecurity:    Worry: Not on file    Inability: Not on file  .  Transportation needs:    Medical: Not on file    Non-medical: Not on file  Tobacco Use  . Smoking status: Never Smoker  . Smokeless tobacco: Never Used  Substance and Sexual Activity  . Alcohol use: Yes    Alcohol/week: 1.0 standard drinks    Types: 1 Standard drinks or equivalent per week    Comment: socially  . Drug use: No  . Sexual activity: Not Currently  Lifestyle  . Physical activity:    Days per week: Not on file    Minutes per session: Not on file  . Stress: Not on file  Relationships  . Social connections:    Talks on phone: Not on file    Gets together: Not on file    Attends religious service: Not on file    Active member of club or organization: Not on file    Attends meetings of clubs or organizations: Not on file    Relationship status: Not on file  Other Topics Concern  . Not on file  Social History Narrative   Parents moved into her home on June 2013. Lives with her spouse.  2 caffeine drinks per day.  Takes a 30 minute walk 7 days a week. Left-handed   Family History: Family History  Problem Relation Age of Onset  . Hypertension Mother   .  Bladder Cancer Mother   . Hyperlipidemia Mother   . Migraines Mother   . Arthritis Mother   . Obesity Mother   . Hypertension Father   . CVA Father   . Heart failure Father   . Arthritis Father   . Hyperlipidemia Father   . Kidney failure Father   . Heart disease Maternal Grandfather   . Lung cancer Paternal Grandmother        lung  . Alcohol abuse Paternal Uncle   . Diabetes Cousin   . Arthritis Sister   . Hypertension Brother   . Arthritis Brother   . Obesity Brother    Allergies: Allergies  Allergen Reactions  . Sulfa Antibiotics Anaphylaxis  . Sulfamethoxazole-Trimethoprim Anaphylaxis, Swelling and Rash    Redness and swelling  . Bee Venom   . Trazodone And Nefazodone     headache   Medications: See med rec.  Review of Systems: No headache, visual changes, nausea, vomiting, diarrhea,  constipation, dizziness, abdominal pain, skin rash, fevers, chills, night sweats, weight loss, swollen lymph nodes, body aches, joint swelling, muscle aches, chest pain, shortness of breath, mood changes, visual or auditory hallucinations.   Objective:   General: Well Developed, well nourished, and in no acute distress.  Neuro:  Extra-ocular muscles intact, able to move all 4 extremities, sensation grossly intact.  Deep tendon reflexes tested were normal. Psych: Alert and oriented, mood congruent with affect. ENT:  Ears and nose appear unremarkable.  Hearing grossly normal. Neck: Unremarkable overall appearance, trachea midline.  No visible thyroid enlargement. Eyes: Conjunctivae and lids appear unremarkable.  Pupils equal and round. Skin: Warm and dry, no rashes noted.  Cardiovascular: Pulses palpable, no extremity edema. Right knee: Tender palpation to medial and lateral joint lines, valgus deformity. ROM normal in flexion and extension and lower leg rotation. Ligaments with solid consistent endpoints including ACL, PCL, LCL, MCL. Negative Mcmurray's and provocative meniscal tests. Non painful patellar compression. Patellar and quadriceps tendons unremarkable. Hamstring and quadriceps strength is normal.  OA reaction unloader knee brace applied to unload the lateral compartment.  Impression and Recommendations:   This case required medical decision making of moderate complexity.  Primary osteoarthritis of right knee Severe lateral compartment osteoarthritis. History of gastric bypass we cannot use oral NSAIDs. Unloader brace placed today. Need updated x-rays. Adding arthritis strength Tylenol and topical Pennsaid. Return to see me in a month, we will do an injection if no better. She wants to do anything possible to avoid knee arthroplasty.   ___________________________________________ Gwen Her. Dianah Field, M.D., ABFM., CAQSM. Primary Care and Sports Medicine [place]  [facility]  Adjunct Professor of [employer] [facility] [employer]

## 2019-02-01 ENCOUNTER — Encounter: Payer: Self-pay | Admitting: Sports Medicine

## 2019-02-05 ENCOUNTER — Encounter: Payer: Self-pay | Admitting: Sports Medicine

## 2019-02-05 ENCOUNTER — Ambulatory Visit (INDEPENDENT_AMBULATORY_CARE_PROVIDER_SITE_OTHER): Payer: 59 | Admitting: Sports Medicine

## 2019-02-05 DIAGNOSIS — M1711 Unilateral primary osteoarthritis, right knee: Secondary | ICD-10-CM | POA: Diagnosis not present

## 2019-02-05 MED ORDER — DICLOFENAC SODIUM 2 % TD SOLN: 2.0000 | Freq: Two times a day (BID) | TRANSDERMAL | 11 refills | Status: DC

## 2019-02-05 NOTE — Progress Notes (Signed)
Virtual Visit via Telephone   I connected with  Rose Long  on 02/05/19 by telephone/telehealth and verified that I am speaking with the correct person using two identifiers.   I discussed the limitations, risks, security and privacy concerns of performing an evaluation and management service by telephone, including the higher likelihood of inaccurate diagnosis and treatment, and the availability of in person appointments.  We also discussed the likely need of an additional face to face encounter for complete and high quality delivery of care.  I also discussed with the patient that there may be a patient responsible charge related to this service. The patient expressed understanding and wishes to proceed.  Provider location is either at home or medical facility. Patient location is at their home, different from provider location. People involved in care of the patient during this telehealth encounter were myself, my nurse/medical assistant, and my front office/scheduling team member.  Subjective:    CC: Knee osteoarthritis  HPI: This is a pleasant 58 year old female, she has responded extremely well to Tylenol, topical Pennsaid, as well as an unloader reaction knee brace.  Happy with how things are going.  I reviewed the past medical history, family history, social history, surgical history, and allergies today and no changes were needed.  Please see the problem list section below in epic for further details.  Past Medical History: Past Medical History:  Diagnosis Date  . Anemia   . Arthritis   . Bronchitis 2019  . Complication of anesthesia    PONV  . Family history of adverse reaction to anesthesia    pt's mother stopped breathing possibly due to sleep apnea  . Migraine   . Obesity   . Simple ovarian cyst    Right   Past Surgical History: Past Surgical History:  Procedure Laterality Date  . ABDOMINOPLASTY/PANNICULECTOMY    . BREAST SURGERY  1998   reduction  .  BREAST SURGERY  10/2013   Lift, tummy tuck  . NASAL SINUS SURGERY  1999  . REDUCTION MAMMAPLASTY  1998  . REDUCTION MAMMAPLASTY  2017   Breast lift  . 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 TOTAL KNEE ARTHROPLASTY Left 09/30/2015   Procedure: TOTAL KNEE ARTHROPLASTY;  Surgeon: Melrose Nakayama, MD;  Location: [facility];  Service: Orthopedics;  Laterality: Left;  . under arm skin removed    . uterine ablation  2012   Social History: Social History   Socioeconomic History  . Marital status: Married    Spouse name: Not on file  . Number of children: 0  . Years of education: Masters  . Highest education level: Not on file  Occupational History    Comment: [employer]  Social Needs  . Financial resource strain: Not on file  . Food insecurity:    Worry: Not on file    Inability: Not on file  . Transportation needs:    Medical: Not on file    Non-medical: Not on file  Tobacco Use  . Smoking status: Never Smoker  . Smokeless tobacco: Never Used  Substance and Sexual Activity  . Alcohol use: Yes    Alcohol/week: 1.0 standard drinks    Types: 1 Standard drinks or equivalent per week    Comment: socially  . Drug use: No  . Sexual activity: Not Currently  Lifestyle  . Physical activity:    Days per week: Not on file    Minutes per session: Not on file  . Stress: Not on file  Relationships  . Social connections:    Talks on phone: Not on file    Gets together: Not on file    Attends religious service: Not on file    Active member of club or organization: Not on file    Attends meetings of clubs or organizations: Not on file    Relationship status: Not on file  Other Topics Concern  . Not on file  Social History Narrative   Parents moved into her home on June 2013. Lives with her spouse.  2 caffeine drinks per day.  Takes a 30 minute walk 7 days a week. Left-handed   Family History: Family History  Problem Relation Age of Onset  . Hypertension Mother   . Bladder Cancer Mother    . Hyperlipidemia Mother   . Migraines Mother   . Arthritis Mother   . Obesity Mother   . Hypertension Father   . CVA Father   . Heart failure Father   . Arthritis Father   . Hyperlipidemia Father   . Kidney failure Father   . Heart disease Maternal Grandfather   . Lung cancer Paternal Grandmother        lung  . Alcohol abuse Paternal Uncle   . Diabetes Cousin   . Arthritis Sister   . Hypertension Brother   . Arthritis Brother   . Obesity Brother    Allergies: Allergies  Allergen Reactions  . Sulfa Antibiotics Anaphylaxis  . Sulfamethoxazole-Trimethoprim Anaphylaxis, Swelling and Rash    Redness and swelling  . Bee Venom   . Trazodone And Nefazodone     headache   Medications: See med rec.  Review of Systems: No fevers, chills, night sweats, weight loss, chest pain, or shortness of breath.   Objective:    General: Speaking full sentences, no audible heavy breathing.  Sounds alert and appropriately interactive.  No other physical exam performed due to the non-face to face nature of this visit.  Impression and Recommendations:    Primary osteoarthritis of right knee Pain now essentially resolved with topical diclofenac, oral arthritis strength Tylenol, as well as an OA unloader reaction knee brace. We had a good long discussion about the knee brace and how to improve the comfort when using it. Trying to keep away from oral NSAIDs due to true gastric bypass. Refilling Pennsaid (diclofenac 2% topical)   I discussed the above assessment and treatment plan with the patient. The patient was provided an opportunity to ask questions and all were answered. The patient agreed with the plan and demonstrated an understanding of the instructions.   The patient was advised to call back or seek an in-person evaluation if the symptoms worsen or if the condition fails to improve as anticipated.   I provided 25 minutes of non-face-to-face time during this encounter, 15 minutes of  additional time was needed to gather information, review chart, records, communicate/coordinate with staff remotely, and complete documentation.   ___________________________________________ Gwen Her. Dianah Field, M.D., ABFM., CAQSM. Primary Care and Sports Medicine [facility] [facility]  Adjunct Professor of [employer] [facility] [employer]

## 2019-02-05 NOTE — Assessment & Plan Note (Addendum)
Pain now essentially resolved with topical diclofenac, oral arthritis strength Tylenol, as well as an OA unloader reaction knee brace. We had a good long discussion about the knee brace and how to improve the comfort when using it. Trying to keep away from oral NSAIDs due to true gastric bypass. Refilling Pennsaid (diclofenac 2% topical)

## 2019-02-07 ENCOUNTER — Ambulatory Visit (INDEPENDENT_AMBULATORY_CARE_PROVIDER_SITE_OTHER): Payer: 59 | Admitting: Family Medicine

## 2019-02-07 ENCOUNTER — Encounter: Payer: Self-pay | Admitting: Family Medicine

## 2019-02-07 VITALS — BP 115/77 | HR 73 | Temp 98.8°F | Ht 67.0 in

## 2019-02-07 DIAGNOSIS — B029 Zoster without complications: Secondary | ICD-10-CM | POA: Diagnosis not present

## 2019-02-07 LAB — TIQ-NTM

## 2019-02-07 MED ORDER — LIDOCAINE 2 % EX GEL: 1.0000 "application " | Freq: Three times a day (TID) | CUTANEOUS | 1 refills | Status: DC | PRN

## 2019-02-07 MED ORDER — GABAPENTIN 100 MG PO CAPS: 100.0000 mg | ORAL_CAPSULE | Freq: Three times a day (TID) | ORAL | 1 refills | Status: DC | PRN

## 2019-02-07 MED ORDER — TRAMADOL HCL 50 MG PO TABS: 50.0000 mg | ORAL_TABLET | Freq: Four times a day (QID) | ORAL | 0 refills | Status: AC | PRN

## 2019-02-07 MED ORDER — VALACYCLOVIR HCL 1 G PO TABS: 1000.0000 mg | ORAL_TABLET | Freq: Three times a day (TID) | ORAL | 0 refills | Status: AC

## 2019-02-07 NOTE — Progress Notes (Signed)
Acute Office Visit  Subjective:    Patient ID: Rose Long, female    DOB: Oct 01, 1961, 58 y.o.   MRN: [id number]  Chief Complaint  Patient presents with  . Rash    pt reports last wk sunday she noticed a rash around her belly button she said that she felt the "pain" of the rash before it started/noticed it she denies being around any poison ivy/oak she tried calamine lotion on the area on sunday only. she also has a couple of spots on her back that popped up and began to itch also    HPI Patient is in today for Rash.  She says initially she noticed some pain over her abdomen near her bellybutton.  She thought maybe she overdid it and pulled some muscles.  Then about 3 to 4 days later she noticed a rash around her bellybutton.  She denies any known contact allergy such as poison ivy etc. She tried some  calamine lotion on the area on sunday only. she also has a couple of spots on her back that popped up and began to itch also.  No fevers chills or sweats but says it is quite uncomfortable and painful.  Past Medical History:  Diagnosis Date  . Anemia   . Arthritis   . Bronchitis 2019  . Complication of anesthesia    PONV  . Family history of adverse reaction to anesthesia    pt's mother stopped breathing possibly due to sleep apnea  . Migraine   . Obesity   . Simple ovarian cyst    Right    Past Surgical History:  Procedure Laterality Date  . ABDOMINOPLASTY/PANNICULECTOMY    . BREAST SURGERY  1998   reduction  . BREAST SURGERY  10/2013   Lift, tummy tuck  . NASAL SINUS SURGERY  1999  . REDUCTION MAMMAPLASTY  1998  . REDUCTION MAMMAPLASTY  2017   Breast lift  . 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 under arm skin removed    . uterine ablation  2012    Family History  Problem Relation Age of Onset  .  Hypertension Mother   . Bladder Cancer Mother   . Hyperlipidemia Mother   . Migraines Mother   . Arthritis Mother   . Obesity Mother   . Hypertension Father   . CVA Father   . Heart failure Father   . Arthritis Father   . Hyperlipidemia Father   . Kidney failure Father   . Heart disease Maternal Grandfather   . Lung cancer Paternal Grandmother        lung  . Alcohol abuse Paternal Uncle   . Diabetes Cousin   . Arthritis Sister   . Hypertension Brother   . Arthritis Brother   . Obesity Brother     Social History   Socioeconomic History  . Marital status: Married    Spouse name: Not on file  . Number of children: 0  . Years of education: Masters  . Highest education level: Not on file  Occupational History    Comment: [employer]  Social Needs  . Financial resource strain: Not on file  . Food insecurity:    Worry: Not on file    Inability: Not on file  . Transportation needs:    Medical: Not on file  Non-medical: Not on file  Tobacco Use  . Smoking status: Never Smoker  . Smokeless tobacco: Never Used  Substance and Sexual Activity  . Alcohol use: Yes    Alcohol/week: 1.0 standard drinks    Types: 1 Standard drinks or equivalent per week    Comment: socially  . Drug use: No  . Sexual activity: Not Currently  Lifestyle  . Physical activity:    Days per week: Not on file    Minutes per session: Not on file  . Stress: Not on file  Relationships  . Social connections:    Talks on phone: Not on file    Gets together: Not on file    Attends religious service: Not on file    Active member of club or organization: Not on file    Attends meetings of clubs or organizations: Not on file    Relationship status: Not on file  . Intimate partner violence:    Fear of current or ex partner: Not on file    Emotionally abused: Not on file    Physically abused: Not on file    Forced sexual activity: Not on file  Other Topics Concern  . Not on file  Social  History Narrative   Parents moved into her home on June 2013. Lives with her spouse.  2 caffeine drinks per day.  Takes a 30 minute walk 7 days a week. Left-handed    Outpatient Medications Prior to Visit  Medication Sig Dispense Refill  . acetaminophen (TYLENOL) 650 MG CR tablet Take 1 tablet (650 mg total) by mouth every 8 (eight) hours as needed for pain. 90 tablet 3  . albuterol (PROVENTIL HFA;VENTOLIN HFA) 108 (90 Base) MCG/ACT inhaler Inhale 1-2 puffs into the lungs every 4 (four) hours as needed for wheezing or shortness of breath. 1 Inhaler 0  . baclofen (LIORESAL) 10 MG tablet Take 10 mg by mouth 2 (two) times daily as needed.  0  . calcium citrate-vitamin D 200-200 MG-UNIT TABS Take 1 tablet by mouth daily.      . Cholecalciferol (VITAMIN D3) 5000 units TABS Take 1 tablet (5,000 Units total) by mouth 2 (two) times daily. 90 tablet 1  . Diclofenac Sodium 2 % SOLN Place 2 sprays onto the skin 2 (two) times daily. 1 Bottle 11  . diphenhydrAMINE (BENADRYL) 25 mg capsule Take 25 mg by mouth as needed.     . ferrous sulfate 325 (65 FE) MG tablet Take 325 mg by mouth daily with breakfast.    . MAGNESIUM CITRATE PO Take 1 tablet by mouth 2 (two) times daily.     . Multiple Vitamin (MULTIVITAMIN) tablet Take 1 tablet by mouth daily.      . sertraline (ZOLOFT) 100 MG tablet Take 1 tablet (100 mg total) by mouth daily. 90 tablet 1  . zonisamide (ZONEGRAN) 50 MG capsule Take 3 capsules by mouth daily.     No facility-administered medications prior to visit.     Allergies  Allergen Reactions  . Sulfa Antibiotics Anaphylaxis  . Sulfamethoxazole-Trimethoprim Anaphylaxis, Swelling and Rash    Redness and swelling  . Bee Venom   . Trazodone And Nefazodone     headache    ROS     Objective:    Physical Exam  Constitutional: She is oriented to person, place, and time. She appears well-developed and well-nourished.  HENT:  Head: Normocephalic and atraumatic.  Eyes: Conjunctivae and EOM  are normal.  Cardiovascular: Normal rate.  Pulmonary/Chest: Effort  normal.  Abdominal:    She also has a couple lesions just to the right of the spine in the flank area also in that dermatome.  Neurological: She is alert and oriented to person, place, and time.  Skin: Skin is dry. No pallor.  Psychiatric: She has a normal mood and affect. Her behavior is normal.  Vitals reviewed.   BP 115/77   Pulse 73   Temp 98.8 F (37.1 C)   Ht 5\' 7"  (1.702 m)   SpO2 99%   BMI 33.36 kg/m  Wt Readings from Last 3 Encounters:  09/28/18 213 lb (96.6 kg)  09/12/18 211 lb (95.7 kg)  07/24/18 220 lb (99.8 kg)    Health Maintenance Due  Topic Date Due  . COLONOSCOPY  10/22/2018    There are no preventive care reminders to display for this patient.   Lab Results  Component Value Date   TSH 2.311 11/29/2012   Lab Results  Component Value Date   WBC 5.7 09/12/2018   HGB 13.6 09/12/2018   HCT 40.9 09/12/2018   MCV 85.2 09/12/2018   PLT 382 09/12/2018   Lab Results  Component Value Date   NA 141 09/12/2018   K 4.1 09/12/2018   CO2 28 09/12/2018   GLUCOSE 88 09/12/2018   BUN 11 09/12/2018   CREATININE 0.84 09/12/2018   BILITOT 0.3 09/12/2018   ALKPHOS 70 06/02/2015   AST 20 09/12/2018   ALT 19 09/12/2018   PROT 7.0 09/12/2018   ALBUMIN 3.6 06/02/2015   CALCIUM 9.6 09/12/2018   ANIONGAP 9 09/19/2015   Lab Results  Component Value Date   CHOL 161 08/05/2014   Lab Results  Component Value Date   HDL 60 08/05/2014   Lab Results  Component Value Date   LDLCALC 86 08/05/2014   Lab Results  Component Value Date   TRIG 76 08/05/2014   Lab Results  Component Value Date   CHOLHDL 2.7 08/05/2014   No results found for: HGBA1C     Assessment & Plan:   Problem List Items Addressed This Visit    None    Visit Diagnoses    Herpes zoster without complication    -  Primary   Relevant Medications   valACYclovir (VALTREX) 1000 MG tablet   gabapentin (NEURONTIN) 100  MG capsule   traMADol (ULTRAM) 50 MG tablet   Lidocaine 2 % GEL   Other Relevant Orders   Varicella-zoster by PCR     Rash is most consistent with shingles.  Discussed diagnosis.  Keep the area covered.  Do not share towels etc.  Wash clothes in warm water.  Avoid direct skin to skin contact with her husband.  Avoid being around pregnant women.  Will treat with valacyclovir as well as topical lidocaine.  For pain control will try gabapentin as well as tramadol.  Did warn about potential for sedation.  Drill at bedtime for sleep so did encourage her to drop that down to just 25 mg and I want her to be overly sedated.  If pain increases or gets worse then we can consider narcotics if needed.  She can call back and let me know if she is having any problems.  I do a culture as well but I feel 99% sure that it is shingles.   Meds ordered this encounter  Medications  . valACYclovir (VALTREX) 1000 MG tablet    Sig: Take 1 tablet (1,000 mg total) by mouth 3 (three) times daily for 7  days.    Dispense:  21 tablet    Refill:  0  . gabapentin (NEURONTIN) 100 MG capsule    Sig: Take 1 capsule (100 mg total) by mouth 3 (three) times daily as needed.    Dispense:  90 capsule    Refill:  1  . traMADol (ULTRAM) 50 MG tablet    Sig: Take 1 tablet (50 mg total) by mouth every 6 (six) hours as needed for up to 5 days.    Dispense:  20 tablet    Refill:  0  . Lidocaine 2 % GEL    Sig: Apply 1 application topically 3 (three) times daily as needed.    Dispense:  60 g    Refill:  1    OK to sub OTC or similar version     Beatrice Lecher, MD

## 2019-02-10 LAB — VARICELLA-ZOSTER BY PCR: VARICELLA ZOSTER VIRUS (VZV) DNA, QL RT PCR: DETECTED — AB

## 2019-02-23 ENCOUNTER — Other Ambulatory Visit: Payer: Self-pay | Admitting: Family Medicine

## 2019-02-23 DIAGNOSIS — G43709 Chronic migraine without aura, not intractable, without status migrainosus: Secondary | ICD-10-CM | POA: Diagnosis not present

## 2019-02-23 MED ORDER — SERTRALINE HCL 100 MG PO TABS: 200.0000 mg | ORAL_TABLET | Freq: Every day | ORAL | 1 refills | Status: DC

## 2019-04-04 ENCOUNTER — Encounter: Payer: 59 | Admitting: Family Medicine

## 2019-04-13 ENCOUNTER — Ambulatory Visit (INDEPENDENT_AMBULATORY_CARE_PROVIDER_SITE_OTHER): Payer: 59 | Admitting: Family Medicine

## 2019-04-13 ENCOUNTER — Encounter: Payer: Self-pay | Admitting: Family Medicine

## 2019-04-13 VITALS — BP 136/70 | HR 71 | Ht 67.0 in | Wt 222.0 lb

## 2019-04-13 DIAGNOSIS — Z1211 Encounter for screening for malignant neoplasm of colon: Secondary | ICD-10-CM | POA: Diagnosis not present

## 2019-04-13 DIAGNOSIS — Z0000 Encounter for general adult medical examination without abnormal findings: Secondary | ICD-10-CM | POA: Diagnosis not present

## 2019-04-13 NOTE — Patient Instructions (Addendum)
Call Dr. Barnett Abu to schedule your pap smear and breast exam.

## 2019-04-13 NOTE — Progress Notes (Signed)
Subjective:     Rose Long is a 58 y.o. female and is here for a comprehensive physical exam. The patient reports problems - still has a rash where she has shingles. Would like me look at it today.  She is not having any significant pain or discomfort.  She is interested in getting the shingles vaccine.  It is been about 8 weeks since she was first diagnosed and says that the cleared about 6 weeks ago.  She has been trying to walk her dog for exercise but but is been extremely busy she works full-time and on top of that has been spending her evenings with her father who is currently on hospice and trying to help out her mother and brother.  Social History   Socioeconomic History  . Marital status: Married    Spouse name: Not on file  . Number of children: 0  . Years of education: Masters  . Highest education level: Not on file  Occupational History    Comment: [employer]  Social Needs  . Financial resource strain: Not on file  . Food insecurity    Worry: Not on file    Inability: Not on file  . Transportation needs    Medical: Not on file    Non-medical: Not on file  Tobacco Use  . Smoking status: Never Smoker  . Smokeless tobacco: Never Used  Substance and Sexual Activity  . Alcohol use: Yes    Alcohol/week: 1.0 standard drinks    Types: 1 Standard drinks or equivalent per week    Comment: socially  . Drug use: No  . Sexual activity: Not Currently  Lifestyle  . Physical activity    Days per week: Not on file    Minutes per session: Not on file  . Stress: Not on file  Relationships  . Social Herbalist on phone: Not on file    Gets together: Not on file    Attends religious service: Not on file    Active member of club or organization: Not on file    Attends meetings of clubs or organizations: Not on file    Relationship status: Not on file  . Intimate partner violence    Fear of current or ex partner: Not on file    Emotionally abused: Not on  file    Physically abused: Not on file    Forced sexual activity: Not on file  Other Topics Concern  . Not on file  Social History Narrative   Parents moved into her home on June 2013. Lives with her spouse.  2 caffeine drinks per day.  Takes a 30 minute walk 7 days a week. Left-handed   Health Maintenance  Topic Date Due  . COLONOSCOPY  10/22/2018  . PAP SMEAR-Modifier  06/12/2019  . INFLUENZA VACCINE  05/19/2019  . MAMMOGRAM  07/28/2020  . TETANUS/TDAP  11/17/2022  . Hepatitis C Screening  Completed  . HIV Screening  Completed    The following portions of the patient's history were reviewed and updated as appropriate: allergies, current medications, past family history, past medical history, past social history, past surgical history and problem list.  Review of Systems A comprehensive review of systems was negative.   Objective:    BP 136/70   Pulse 71   Ht 5\' 7"  (1.702 m)   Wt 222 lb (100.7 kg)   SpO2 100%   BMI 34.77 kg/m  General appearance: alert, cooperative and appears stated  age Head: Normocephalic, without obvious abnormality, atraumatic Eyes: conj clear, EOMI, PEERLA Ears: normal TM's and external ear canals both ears Nose: Nares normal. Septum midline. Mucosa normal. No drainage or sinus tenderness. Throat: lips, mucosa, and tongue normal; teeth and gums normal Neck: no adenopathy, no carotid bruit, no JVD, supple, symmetrical, trachea midline and thyroid not enlarged, symmetric, no tenderness/mass/nodules Back: symmetric, no curvature. ROM normal. No CVA tenderness. Lungs: clear to auscultation bilaterally Heart: regular rate and rhythm, S1, S2 normal, no murmur, click, rub or gallop Abdomen: soft, non-tender; bowel sounds normal; no masses,  no organomegaly Extremities: extremities normal, atraumatic, no cyanosis or edema Pulses: 2+ and symmetric Skin: Skin color, texture, turgor normal. No rashes or lesions.  She does have some hyperpigmentation where she  had the rash in the exact same pattern on that right lower quadrant of the anterior abdomen.  He also has an erythematous scaly patch on the left facial cheek just above the TMJ joint in front of the ear. Lymph nodes: Cervical adenopathy: nl and Supraclavicular adenopathy: nl Neurologic: Alert and oriented X 3, normal strength and tone. Normal symmetric reflexes. Normal coordination and gait    Assessment:    Healthy female exam.      Plan:     See After Visit Summary for Counseling Recommendations   Keep up a regular exercise program and make sure you are eating a healthy diet Try to eat 4 servings of dairy a day, or if you are lactose intolerant take a calcium with vitamin D daily.  Your vaccines are up to date.   Hyperpigmentation from shingles rash-it should eventually fade but she is so fair skinned it may actually take up to a year.  Rash on near her left ear on her face near her jaw-most consistent with an actinic [facility] think she should discuss with her dermatologist she will be following up with them this summer.  She is interested in getting the shingles vaccine.  Recommend waiting at least 1 year after shingles outbreak to receive the vaccine.  She is due for 5-year repeat colonoscopy so we will add in place referral to GI for that as well.

## 2019-04-18 ENCOUNTER — Telehealth: Payer: Self-pay

## 2019-04-18 MED ORDER — SAXENDA 18 MG/3ML ~~LOC~~ SOPN: PEN_INJECTOR | SUBCUTANEOUS | 0 refills | Status: DC

## 2019-04-18 NOTE — Telephone Encounter (Signed)
[place], sorry. New rx sent.

## 2019-04-18 NOTE — Telephone Encounter (Signed)
Pt advised.

## 2019-04-18 NOTE — Telephone Encounter (Signed)
Rose Long states the pharmacy didn't receive the Saxenda.

## 2019-04-19 ENCOUNTER — Telehealth: Payer: Self-pay | Admitting: Family Medicine

## 2019-04-19 NOTE — Telephone Encounter (Signed)
Received fax from [employer] that Saxenda requires a PA. Information has been sent to the insurance company. Awaiting determination.   

## 2019-04-23 NOTE — Telephone Encounter (Signed)
Received a fax that insurance was requesting patient BMI and it was noted on the fax and sent back to insurance. Waiting on a response.

## 2019-04-25 MED FILL — TECHLITE PEN NDL 32GX1/4": 32G X 6 MM | 90 days supply | Qty: 100 | Fill #0

## 2019-04-25 MED FILL — TECHLITE PEN NDL 32GX1/4: 32G X 6 MM | 90 days supply | Qty: 100 | Fill #0

## 2019-04-25 NOTE — Telephone Encounter (Signed)
Received a fax from [employer] that Kirke Shaggy has been approved from 04/24/2019 through 08/24/2019. Pharmacy aware and form sent to scan.

## 2019-04-26 ENCOUNTER — Ambulatory Visit: Payer: 59 | Admitting: Skilled Nursing Facility1

## 2019-04-26 ENCOUNTER — Encounter: Payer: Self-pay | Admitting: Family Medicine

## 2019-04-26 MED FILL — SERTRALINE HCL 100 MG TAB: 100 | 90 days supply | Qty: 180 | Fill #0

## 2019-04-26 MED FILL — SAXENDA 18 MG/3 ML PEN: 18 | 30 days supply | Qty: 6 | Fill #0

## 2019-04-26 NOTE — Telephone Encounter (Signed)
Patient will come by the office to pick up a savings card in office this afternoon.

## 2019-04-27 DIAGNOSIS — M542 Cervicalgia: Secondary | ICD-10-CM | POA: Diagnosis not present

## 2019-04-27 DIAGNOSIS — M791 Myalgia, unspecified site: Secondary | ICD-10-CM | POA: Diagnosis not present

## 2019-04-27 DIAGNOSIS — G43709 Chronic migraine without aura, not intractable, without status migrainosus: Secondary | ICD-10-CM | POA: Diagnosis not present

## 2019-04-27 MED FILL — tiZANidine HCL 4 MG TABS: 4 | 10 days supply | Qty: 20 | Fill #0

## 2019-04-27 MED FILL — ZONISAMIDE 50 MG CAPSULE: 50 | 90 days supply | Qty: 90 | Fill #0

## 2019-05-02 ENCOUNTER — Other Ambulatory Visit: Payer: Self-pay

## 2019-05-02 ENCOUNTER — Encounter: Payer: Self-pay | Admitting: Skilled Nursing Facility1

## 2019-05-02 ENCOUNTER — Encounter: Payer: 59 | Attending: Family Medicine | Admitting: Skilled Nursing Facility1

## 2019-05-02 DIAGNOSIS — E639 Nutritional deficiency, unspecified: Secondary | ICD-10-CM | POA: Insufficient documentation

## 2019-05-02 DIAGNOSIS — E669 Obesity, unspecified: Secondary | ICD-10-CM | POA: Insufficient documentation

## 2019-05-02 NOTE — Progress Notes (Signed)
  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 Pt states she got down to 181 pounds. Pt states gaining this 40 pounds makes her crazy. Pt states her father has recently passed away. Pt states she does not believe she is an emotional eater. Pt states she has 5-10 minutes in between each pt starting at 8am so she skips meals often. Pt states she does not struggle with portions using small plates. Pt states she sets her own schedule but does not get paid if she is not working. Pt states she has been drinking about a gallon of sweet tea every 3-4 days. Pt states she has lots of colon problem needing dulcolax daily. Pt states she has nausea due to constipation.    MEDICATIONS: See List   DIETARY INTAKE:  Usual eating pattern includes 2 meals and 1-2 snacks per day.  Everyday foods include none stated.  Avoided foods include none stated.    24-hr recall:  B ( AM):  Snk ( AM):  L ( 11:30 PM): oatmeal with toast in coffee cup Snk (2 PM): swiss cheese and ham rollup D ( 4 PM):  Snk ( PM):  Beverages: sweet tea, water   Usual physical activity: ADL's    Intervention:  Nutrition counseling. Goals: -Eat every 3-5 hours with balanced meals -Aim to eat non starchy vegetables 2 times a day 7 days a week -Work on planning and prepping to reach these goals -Try kefir  -Tell your husband not to eat your food -Get the appropriate multivitamin   -Take your calcium 2 hours apart   Teaching Method Utilized:  Visual Auditory Hands on  Handouts given during visit include:  myplate   Balanced   Barriers to learning/adherence to lifestyle change: none identified  Demonstrated degree of understanding via:  Teach Back   Monitoring/Evaluation:  Dietary intake, exercise, and body weight prn.

## 2019-05-09 ENCOUNTER — Ambulatory Visit: Payer: 59 | Admitting: Skilled Nursing Facility1

## 2019-05-16 ENCOUNTER — Encounter: Payer: 59 | Admitting: Skilled Nursing Facility1

## 2019-05-16 ENCOUNTER — Other Ambulatory Visit: Payer: Self-pay

## 2019-05-16 DIAGNOSIS — E639 Nutritional deficiency, unspecified: Secondary | ICD-10-CM

## 2019-05-16 NOTE — Progress Notes (Signed)
  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 Pt states her father has recently passed away. Pt states she does not believe she is an emotional eater. Pt states she has 5-10 minutes in between each pt starting at 8am so she skips meals often. Pt states she does not struggle with portions using small plates. Pt states she sets her own schedule but does not get paid if she is not working.Pt states she has lots of colon problem needing dulcolax daily.  Pt states her Bowel movements every other day with more vegetables and the soy crumbles with breakfast which she liked. Pt state she did label her food so her husband will not eat it and has bene planning her meals.    217.3 pounds  Body Composition Scale 05/16/2019  Total Body Fat % 41.9  Visceral Fat 12  Fat-Free Mass % 58   Total Body Water % 43.5   Muscle-Mass lbs 31.3  Body Fat Displacement          Torso  lbs 56.4         Left Leg  lbs 11.2         Right Leg  lbs 11.2         Left Arm  lbs 5.6         Right Arm   lbs 5.6    MEDICATIONS: See List   DIETARY INTAKE:  Usual eating pattern includes 2 meals and 1-2 snacks per day.  Everyday foods include none stated.  Avoided foods include none stated.    24-hr recall:  B ( AM):  Snk ( AM): cheese or nuts L ( 11:30 PM): oatmeal with toast in coffee cup Snk (2 PM): swiss cheese and ham rollup D ( 4 PM):  Snk ( PM):  Beverages: sweet tea, water   Usual physical activity: Walking: 5000-6000 steps     Intervention:  Nutrition counseling. Goals: -Eat every 3-5 hours with balanced meals -Aim to eat non starchy vegetables 2 times a day 7 days a week -Try kefir   Teaching Method Utilized:  Visual Auditory Hands on  Handouts given during visit include:  myplate   Balanced   Barriers to learning/adherence to  lifestyle change: none identified  Demonstrated degree of understanding via:  Teach Back   Monitoring/Evaluation:  Dietary intake, exercise, and body weight prn.

## 2019-05-22 ENCOUNTER — Encounter: Payer: 59 | Attending: Family Medicine | Admitting: Skilled Nursing Facility1

## 2019-05-22 ENCOUNTER — Other Ambulatory Visit: Payer: Self-pay

## 2019-05-22 DIAGNOSIS — E639 Nutritional deficiency, unspecified: Secondary | ICD-10-CM | POA: Insufficient documentation

## 2019-05-22 DIAGNOSIS — E669 Obesity, unspecified: Secondary | ICD-10-CM | POA: Insufficient documentation

## 2019-05-22 NOTE — Progress Notes (Signed)
  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 Pt states her father has recently passed away. Pt states she does not believe she is an emotional eater. Pt states she has 5-10 minutes in between each pt starting at 8am so she skips meals often. Pt states she does not struggle with portions using small plates. Pt states she sets her own schedule but does not get paid if she is not working.Pt states she has lots of colon problem needing dulcolax daily.  Pt states her Bowel movements every other day with more vegetables and the soy crumbles with breakfast which she liked. Pt state she did label her food so her husband will not eat it and has bene planning her meals.   Pt states she does suffer form migraine and had one all last week so had it difficult to make changes. Pt state she did not try kefir. `   218.8 pounds  Body Composition Scale 05/16/2019 05/22/2019  Total Body Fat % 41.9 42  Visceral Fat 12 13  Fat-Free Mass % 58 57.9   Total Body Water % 43.5 43.4   Muscle-Mass lbs 31.3 31.3  Body Fat Displacement           Torso  lbs 56.4 57         Left Leg  lbs 11.2 11.4         Right Leg  lbs 11.2 11.4         Left Arm  lbs 5.6 5.7         Right Arm   lbs 5.6 5.7    MEDICATIONS: See List   DIETARY INTAKE:  Usual eating pattern includes 2 meals and 1-2 snacks per day.  Everyday foods include none stated.  Avoided foods include none stated.    24-hr recall:  B ( AM):  Snk ( AM): cheese or nuts L ( 11:30 PM): oatmeal with toast in coffee cup Snk (2 PM): swiss cheese and ham rollup D ( 4 PM):  Snk ( PM):  Beverages: sweet tea, water   Usual physical activity: Walking: 5000-6000 steps     Intervention:  Nutrition counseling. Goals: -Eat every 3-5 hours with balanced meals -Aim to eat non starchy vegetables 2 times a  day 7 days a week -Try kefir   Teaching Method Utilized:  Visual Auditory Hands on  Handouts given during visit include:  myplate   Balanced   Barriers to learning/adherence to lifestyle change: none identified  Demonstrated degree of understanding via:  Teach Back   Monitoring/Evaluation:  Dietary intake, exercise, and body weight prn.

## 2019-06-01 ENCOUNTER — Other Ambulatory Visit: Payer: Self-pay | Admitting: Family Medicine

## 2019-06-01 MED FILL — ZONISAMIDE 50 MG CAPSULE: 50 | 30 days supply | Qty: 90 | Fill #1

## 2019-06-01 NOTE — Telephone Encounter (Signed)
Rose Long called and states she needs a refill on Saxenda. She will need the next dosing for the new prescription. She has reached to the 1.8 mg. However, she has been out of the medication for 5 days.

## 2019-06-11 DIAGNOSIS — M542 Cervicalgia: Secondary | ICD-10-CM | POA: Diagnosis not present

## 2019-06-11 DIAGNOSIS — G43709 Chronic migraine without aura, not intractable, without status migrainosus: Secondary | ICD-10-CM | POA: Diagnosis not present

## 2019-06-11 DIAGNOSIS — M791 Myalgia, unspecified site: Secondary | ICD-10-CM | POA: Diagnosis not present

## 2019-06-11 MED FILL — ZONISAMIDE 100 MG CAPSULE: 100 | 90 days supply | Qty: 180 | Fill #0

## 2019-06-13 ENCOUNTER — Encounter: Payer: Self-pay | Admitting: Family Medicine

## 2019-06-13 ENCOUNTER — Telehealth: Payer: Self-pay | Admitting: Family Medicine

## 2019-06-13 NOTE — Telephone Encounter (Signed)
Patient will be needing a new prescription for Saxenda. It will need to be 15 ml per 30 days with the 3 mg dose. Can you send this to the pharmacy? Please advise.

## 2019-06-14 MED ORDER — SAXENDA 18 MG/3ML ~~LOC~~ SOPN: 3.0000 mg | PEN_INJECTOR | Freq: Every day | SUBCUTANEOUS | 1 refills | Status: DC

## 2019-06-14 MED FILL — SAXENDA 18 MG/3 ML PEN: 18 | 30 days supply | Qty: 15 | Fill #0

## 2019-06-14 NOTE — Telephone Encounter (Signed)
OK, rx sent.

## 2019-06-27 ENCOUNTER — Ambulatory Visit: Payer: 59 | Admitting: Skilled Nursing Facility1

## 2019-07-04 ENCOUNTER — Other Ambulatory Visit: Payer: Self-pay

## 2019-07-04 ENCOUNTER — Ambulatory Visit (INDEPENDENT_AMBULATORY_CARE_PROVIDER_SITE_OTHER): Payer: 59 | Admitting: Family Medicine

## 2019-07-04 DIAGNOSIS — Z23 Encounter for immunization: Secondary | ICD-10-CM

## 2019-07-24 DIAGNOSIS — M791 Myalgia, unspecified site: Secondary | ICD-10-CM | POA: Diagnosis not present

## 2019-07-24 DIAGNOSIS — G43709 Chronic migraine without aura, not intractable, without status migrainosus: Secondary | ICD-10-CM | POA: Diagnosis not present

## 2019-07-24 DIAGNOSIS — M542 Cervicalgia: Secondary | ICD-10-CM | POA: Diagnosis not present

## 2019-07-24 DIAGNOSIS — Z9884 Bariatric surgery status: Secondary | ICD-10-CM | POA: Diagnosis not present

## 2019-07-24 DIAGNOSIS — G43019 Migraine without aura, intractable, without status migrainosus: Secondary | ICD-10-CM | POA: Diagnosis not present

## 2019-07-24 MED FILL — SERTRALINE HCL 100 MG TABS: 100 | 30 days supply | Qty: 60 | Fill #1

## 2019-07-24 MED FILL — SAXENDA 18 MG/3 ML PEN: 18 | 30 days supply | Qty: 15 | Fill #1

## 2019-07-24 MED FILL — CHLORZOXAZONE 500 MG TABS: 500 | 35 days supply | Qty: 20 | Fill #0

## 2019-07-27 LAB — VITAMIN B12: Vitamin B-12: 251 pg/mL (ref 200–1100)

## 2019-07-27 LAB — COMPLETE METABOLIC PANEL WITH GFR
AG Ratio: 1.3 (calc) (ref 1.0–2.5)
ALT: 11 U/L (ref 6–29)
AST: 13 U/L (ref 10–35)
Albumin: 4 g/dL (ref 3.6–5.1)
Alkaline phosphatase (APISO): 71 U/L (ref 37–153)
BUN: 9 mg/dL (ref 7–25)
CO2: 26 mmol/L (ref 20–32)
Calcium: 9.7 mg/dL (ref 8.6–10.4)
Chloride: 107 mmol/L (ref 98–110)
Creat: 0.84 mg/dL (ref 0.50–1.05)
GFR, Est African American: 89 mL/min/{1.73_m2} (ref >=60)
GFR, Est Non African American: 77 mL/min/{1.73_m2} (ref >=60)
Globulin: 3 g/dL (calc) (ref 1.9–3.7)
Glucose, Bld: 94 mg/dL (ref 65–99)
Potassium: 3.9 mmol/L (ref 3.5–5.3)
Sodium: 143 mmol/L (ref 135–146)
Total Bilirubin: 0.3 mg/dL (ref 0.2–1.2)
Total Protein: 7 g/dL (ref 6.1–8.1)

## 2019-07-27 LAB — CBC
HCT: 40.9 % (ref 35.0–45.0)
Hemoglobin: 13.5 g/dL (ref 11.7–15.5)
MCH: 28.3 pg (ref 27.0–33.0)
MCHC: 33 g/dL (ref 32.0–36.0)
MCV: 85.7 fL (ref 80.0–100.0)
MPV: 12.3 fL (ref 7.5–12.5)
Platelets: 316 10*3/uL (ref 140–400)
RBC: 4.77 10*6/uL (ref 3.80–5.10)
RDW: 13.4 % (ref 11.0–15.0)
WBC: 5.6 10*3/uL (ref 3.8–10.8)

## 2019-07-27 LAB — LIPID PANEL
Cholesterol: 245 mg/dL — ABNORMAL HIGH (ref <200)
HDL: 59 mg/dL (ref >=50)
LDL Cholesterol (Calc): 164 mg/dL (calc) — ABNORMAL HIGH
Non-HDL Cholesterol (Calc): 186 mg/dL (calc) — ABNORMAL HIGH (ref <130)
Total CHOL/HDL Ratio: 4.2 (calc) (ref <5.0)
Triglycerides: 108 mg/dL (ref <150)

## 2019-07-27 LAB — VITAMIN D 25 HYDROXY (VIT D DEFICIENCY, FRACTURES): Vit D, 25-Hydroxy: 20 ng/mL — ABNORMAL LOW (ref 30–100)

## 2019-07-27 LAB — MAGNESIUM: Magnesium: 2 mg/dL (ref 1.5–2.5)

## 2019-07-27 LAB — SPECIMEN COMPROMISED

## 2019-07-27 LAB — TSH: TSH: 2.46 mIU/L (ref 0.40–4.50)

## 2019-07-27 LAB — IRON: Iron: 84 ug/dL (ref 45–160)

## 2019-07-27 LAB — FERRITIN: Ferritin: 16 ng/mL (ref 16–232)

## 2019-07-27 LAB — FOLATE: Folate: 9.5 ng/mL

## 2019-07-27 LAB — VITAMIN B1: Vitamin B1 (Thiamine): 8 nmol/L (ref 8–30)

## 2019-07-31 ENCOUNTER — Telehealth: Payer: Self-pay

## 2019-07-31 MED ORDER — CHOLECALCIFEROL 1.25 MG (50000 UT) PO CAPS: 50000.0000 [IU] | ORAL_CAPSULE | ORAL | 1 refills | Status: DC

## 2019-07-31 MED FILL — METAXALONE 800 MG TABS: 800 | 35 days supply | Qty: 20 | Fill #0

## 2019-07-31 MED FILL — VIT D3-50 50,000 UNITS CAPS: 1.25 MG | 84 days supply | Qty: 12 | Fill #0

## 2019-07-31 NOTE — Telephone Encounter (Signed)
Okay, have her increase her multivitamin to twice a day and I am going to send over a prescription vitamin D to take once a week and we will plan to recheck her vitamin D, thiamine, ferritin and B12 in 8 weeks.

## 2019-07-31 NOTE — Telephone Encounter (Signed)
Lab work follow up.   Rose Long called and states she is taking a multi vitamin and 5,000 IU of vitamin D daily.

## 2019-08-01 NOTE — Telephone Encounter (Signed)
Pt advised. Verbalized understanding.

## 2019-08-01 NOTE — Telephone Encounter (Signed)
Routing for clarification:    Pt to double multivitamin or double vit d?

## 2019-08-01 NOTE — Telephone Encounter (Signed)
Double multivitamin.  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  So I would recommend taking 1 in the morning and 1 in the evening.  I sent over prescription form of vitamin D so she can let us know there is any problems at the pharmacy.  Called cholecalciferol

## 2019-08-20 DIAGNOSIS — M542 Cervicalgia: Secondary | ICD-10-CM | POA: Diagnosis not present

## 2019-08-20 DIAGNOSIS — G43709 Chronic migraine without aura, not intractable, without status migrainosus: Secondary | ICD-10-CM | POA: Diagnosis not present

## 2019-08-20 DIAGNOSIS — M791 Myalgia, unspecified site: Secondary | ICD-10-CM | POA: Diagnosis not present

## 2019-09-07 ENCOUNTER — Other Ambulatory Visit: Payer: Self-pay

## 2019-09-07 MED ORDER — SERTRALINE HCL 100 MG PO TABS: 200.0000 mg | ORAL_TABLET | Freq: Every day | ORAL | 1 refills | Status: DC

## 2019-09-07 MED FILL — SERTRALINE HCL 100 MG TABS: 100 | 90 days supply | Qty: 180 | Fill #0

## 2019-09-18 DIAGNOSIS — M791 Myalgia, unspecified site: Secondary | ICD-10-CM | POA: Diagnosis not present

## 2019-09-18 DIAGNOSIS — M542 Cervicalgia: Secondary | ICD-10-CM | POA: Diagnosis not present

## 2019-09-18 DIAGNOSIS — G43709 Chronic migraine without aura, not intractable, without status migrainosus: Secondary | ICD-10-CM | POA: Diagnosis not present

## 2019-09-18 MED FILL — METAXALONE 800 MG TABS: 800 | 70 days supply | Qty: 40 | Fill #0

## 2019-09-18 MED FILL — ZONISAMIDE 100 MG CAPSULE: 100 | 90 days supply | Qty: 180 | Fill #0

## 2019-10-05 MED FILL — ZONISAMIDE 100 MG CAPSULE: 100 | 90 days supply | Qty: 180 | Fill #0

## 2019-10-10 DIAGNOSIS — M542 Cervicalgia: Secondary | ICD-10-CM | POA: Diagnosis not present

## 2019-10-10 DIAGNOSIS — M791 Myalgia, unspecified site: Secondary | ICD-10-CM | POA: Diagnosis not present

## 2019-10-10 DIAGNOSIS — G43709 Chronic migraine without aura, not intractable, without status migrainosus: Secondary | ICD-10-CM | POA: Diagnosis not present

## 2019-10-23 ENCOUNTER — Telehealth (INDEPENDENT_AMBULATORY_CARE_PROVIDER_SITE_OTHER): Payer: 59 | Admitting: Family Medicine

## 2019-10-23 ENCOUNTER — Encounter: Payer: Self-pay | Admitting: Family Medicine

## 2019-10-23 VITALS — Ht 67.0 in | Wt 215.0 lb

## 2019-10-23 DIAGNOSIS — Z8739 Personal history of other diseases of the musculoskeletal system and connective tissue: Secondary | ICD-10-CM | POA: Diagnosis not present

## 2019-10-23 DIAGNOSIS — Z980 Intestinal bypass and anastomosis status: Secondary | ICD-10-CM | POA: Diagnosis not present

## 2019-10-23 DIAGNOSIS — E559 Vitamin D deficiency, unspecified: Secondary | ICD-10-CM

## 2019-10-23 DIAGNOSIS — Z1231 Encounter for screening mammogram for malignant neoplasm of breast: Secondary | ICD-10-CM | POA: Diagnosis not present

## 2019-10-23 DIAGNOSIS — F418 Other specified anxiety disorders: Secondary | ICD-10-CM

## 2019-10-23 DIAGNOSIS — Z6833 Body mass index (BMI) 33.0-33.9, adult: Secondary | ICD-10-CM | POA: Diagnosis not present

## 2019-10-23 DIAGNOSIS — M19049 Primary osteoarthritis, unspecified hand: Secondary | ICD-10-CM

## 2019-10-23 DIAGNOSIS — Z903 Acquired absence of stomach [part of]: Secondary | ICD-10-CM | POA: Diagnosis not present

## 2019-10-23 NOTE — Addendum Note (Signed)
Addended by: Beatrice Lecher D on: 10/23/2019 09:31 PM   Modules accepted: Orders

## 2019-10-23 NOTE — Assessment & Plan Note (Signed)
At risk for nutritional deficiencies

## 2019-10-23 NOTE — Assessment & Plan Note (Signed)
Doing well on zoloft. Continue current regimen. F/U in 6 mo.

## 2019-10-23 NOTE — Assessment & Plan Note (Signed)
Continue with prescription supplementation at least through the winter months and then plan to recheck labs again in March which will have been 6 months from previous check.

## 2019-10-23 NOTE — Progress Notes (Signed)
lvm advising pt that I was calling to do her prescreening.  

## 2019-10-23 NOTE — Progress Notes (Addendum)
Virtual Visit via Video Note  I connected with Rose Long on 10/23/19 at  9:50 AM EST by a video enabled telemedicine application and verified that I am speaking with the correct person using two identifiers.   I discussed the limitations of evaluation and management by telemedicine and the availability of in person appointments. The patient expressed understanding and agreed to proceed.    Established Patient Office Visit  Subjective:  Patient ID: Rose Long, female    DOB: 1961/07/16  Age: 59 y.o. MRN: [id number]  CC: No chief complaint on file.   HPI Rose Long presents for f/u anxiety.  Oertli on Zoloft 200 mg daily.  3 happy with her dosing and regimen.  Sleep is okay.  Mood has been really good.  No significant anxiety symptoms recently.  She has been able to work from home and has been quite busy.  She has not had a lot of time to workout or exercise but says that her husband is retiring in April and is hoping to be able to get into an exercise routine and maybe go to the gym with him.  Vitamin D deficiency -last vitamin D level in October was 20.  We started her on additional vitamin D supplementation.  She is status post gastric bypass surgery.  She wanted to know how long to stay on it as she has been on it for almost 2 months at this point.  I also noted that she had rheumatoid arthritis on her problem list.  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  She does still get some occasional joint pain and swelling particularly in the hands shoulders elbows and feet.  Sometimes she will have knee pain as well.  Her mother was diagnosed with rheumatoid and her brother has a diagnosis as well.  She has not had any recent labs or work-up or evaluation in regards to this.    Past Medical History:  Diagnosis Date  . Anemia   . Arthritis   . Bronchitis 2019  . Complication of anesthesia    PONV  . Family history of adverse reaction to  anesthesia    pt's mother stopped breathing possibly due to sleep apnea  . Migraine   . Obesity   . Simple ovarian cyst    Right    Past Surgical History:  Procedure Laterality Date  . ABDOMINOPLASTY/PANNICULECTOMY    . BREAST SURGERY  1998   reduction  . BREAST SURGERY  10/2013   Lift, tummy tuck  . NASAL SINUS SURGERY  1999  . REDUCTION MAMMAPLASTY  1998  . REDUCTION MAMMAPLASTY  2017   Breast lift  . 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 TOTAL KNEE ARTHROPLASTY Left 09/30/2015   Procedure: TOTAL KNEE ARTHROPLASTY;  Surgeon: Melrose Nakayama, MD;  Location: [facility];  Service: Orthopedics;  Laterality: Left;  . under arm skin removed    . uterine ablation  2012    Family History  Problem Relation Age of Onset  . Hypertension Mother   . Bladder Cancer Mother   . Hyperlipidemia Mother   . Migraines Mother   . Obesity Mother   . Rheum arthritis Mother   . Hypertension Father   . CVA Father   . Heart failure Father   . Arthritis Father   . Hyperlipidemia Father   . Kidney failure Father   . Heart disease Maternal Grandfather   . Lung cancer Paternal Grandmother  lung  . Alcohol abuse Paternal Uncle   . Diabetes Cousin   . Arthritis Sister   . Hypertension Brother   . Obesity Brother   . Rheum arthritis Brother     Social History   Socioeconomic History  . Marital status: Married    Spouse name: Not on file  . Number of children: 0  . Years of education: Masters  . Highest education level: Not on file  Occupational History    Comment: [employer]  Tobacco Use  . Smoking status: Never Smoker  . Smokeless tobacco: Never Used  Substance and Sexual Activity  . Alcohol use: Yes    Alcohol/week: 1.0 standard drinks    Types: 1 Standard drinks or equivalent per week    Comment: socially  . Drug use: No  . Sexual activity: Not Currently  Other Topics Concern  . Not on file  Social History Narrative   Parents moved into her home on June 2013. Lives with her  spouse.  2 caffeine drinks per day.  Takes a 30 minute walk 7 days a week. Left-handed   Social Determinants of Health   Financial Resource Strain:   . Difficulty of Paying Living Expenses: Not on file  Food Insecurity:   . Worried About Charity fundraiser in the Last Year: Not on file  . Ran Out of Food in the Last Year: Not on file  Transportation Needs:   . Lack of Transportation (Medical): Not on file  . Lack of Transportation (Non-Medical): Not on file  Physical Activity:   . Days of Exercise per Week: Not on file  . Minutes of Exercise per Session: Not on file  Stress:   . Feeling of Stress : Not on file  Social Connections:   . Frequency of Communication with Friends and Family: Not on file  . Frequency of Social Gatherings with Friends and Family: Not on file  . Attends Religious Services: Not on file  . Active Member of Clubs or Organizations: Not on file  . Attends Archivist Meetings: Not on file  . Marital Status: Not on file  Intimate Partner Violence:   . Fear of Current or Ex-Partner: Not on file  . Emotionally Abused: Not on file  . Physically Abused: Not on file  . Sexually Abused: Not on file    Outpatient Medications Prior to Visit  Medication Sig Dispense Refill  . acetaminophen (TYLENOL) 650 MG CR tablet Take 1 tablet (650 mg total) by mouth every 8 (eight) hours as needed for pain. 90 tablet 3  . calcium citrate-vitamin D 200-200 MG-UNIT TABS Take 1 tablet by mouth daily.      . Cholecalciferol 1.25 MG (50000 UT) capsule Take 1 capsule (50,000 Units total) by mouth once a week. 12 capsule 1  . Diclofenac Sodium 2 % SOLN Place 2 sprays onto the skin 2 (two) times daily. 1 Bottle 11  . ferrous sulfate 325 (65 FE) MG tablet Take 325 mg by mouth daily with breakfast.    . MAGNESIUM CITRATE PO Take 1 tablet by mouth 2 (two) times daily.     . metaxalone (SKELAXIN) 800 MG tablet     . Multiple Vitamin (MULTIVITAMIN) tablet Take 1 tablet by mouth  daily.      . sertraline (ZOLOFT) 100 MG tablet Take 2 tablets (200 mg total) by mouth daily. 180 tablet 1  . zonisamide (ZONEGRAN) 100 MG capsule     . zonisamide (ZONEGRAN) 50 MG  capsule Take 3 capsules by mouth daily.    . baclofen (LIORESAL) 10 MG tablet Take 10 mg by mouth 2 (two) times daily as needed.  0  . Liraglutide -Weight Management (SAXENDA) 18 MG/3ML SOPN Inject 3 mg into the skin daily. 15 mL 1   No facility-administered medications prior to visit.    Allergies  Allergen Reactions  . Sulfa Antibiotics Anaphylaxis  . Sulfamethoxazole-Trimethoprim Anaphylaxis, Swelling and Rash    Redness and swelling  . Bee Venom   . Trazodone And Nefazodone     headache    ROS Review of Systems    Objective:    Physical Exam  Constitutional: She is oriented to person, place, and time. She appears well-developed and well-nourished.  HENT:  Head: Normocephalic and atraumatic.  Eyes: Conjunctivae and EOM are normal.  Pulmonary/Chest: Effort normal.  Neurological: She is alert and oriented to person, place, and time.  Skin: No pallor.  Psychiatric: She has a normal mood and affect. Her behavior is normal.  Vitals reviewed.   Ht 5\' 7"  (1.702 m)   Wt 215 lb (97.5 kg)   BMI 33.67 kg/m  Wt Readings from Last 3 Encounters:  10/23/19 215 lb (97.5 kg)  05/22/19 218 lb 12.8 oz (99.2 kg)  05/16/19 217 lb 4.8 oz (98.6 kg)     Health Maintenance Due  Topic Date Due  . PAP SMEAR-Modifier  06/12/2019    There are no preventive care reminders to display for this patient.  Lab Results  Component Value Date   TSH 2.46 07/24/2019   Lab Results  Component Value Date   WBC 5.6 07/24/2019   HGB 13.5 07/24/2019   HCT 40.9 07/24/2019   MCV 85.7 07/24/2019   PLT 316 07/24/2019   Lab Results  Component Value Date   NA 143 07/24/2019   K 3.9 07/24/2019   CO2 26 07/24/2019   GLUCOSE 94 07/24/2019   BUN 9 07/24/2019   CREATININE 0.84 07/24/2019   BILITOT 0.3 07/24/2019    ALKPHOS 70 06/02/2015   AST 13 07/24/2019   ALT 11 07/24/2019   PROT 7.0 07/24/2019   ALBUMIN 3.6 06/02/2015   CALCIUM 9.7 07/24/2019   ANIONGAP 9 09/19/2015   Lab Results  Component Value Date   CHOL 245 (H) 07/24/2019   Lab Results  Component Value Date   HDL 59 07/24/2019   Lab Results  Component Value Date   LDLCALC 164 (H) 07/24/2019   Lab Results  Component Value Date   TRIG 108 07/24/2019   Lab Results  Component Value Date   CHOLHDL 4.2 07/24/2019   No results found for: HGBA1C    Assessment & Plan:   Problem List Items Addressed This Visit      Musculoskeletal and Integument   History of juvenile arthritis   Relevant Medications   metaxalone (SKELAXIN) 800 MG tablet   Other Relevant Orders   DG Hand 2 View Left   DG Hand 2 View Right   Hand arthritis   Relevant Medications   metaxalone (SKELAXIN) 800 MG tablet   Other Relevant Orders   DG Hand 2 View Left   DG Hand 2 View Right     Other   Vitamin D deficiency    Continue with prescription supplementation at least through the winter months and then plan to recheck labs again in March which will have been 6 months from previous check.      Status post total gastrectomy and Roux-en-Y esophagojejunal anastomosis  At risk for nutritional deficiencies      Situational anxiety - Primary    Doing well on zoloft. Continue current regimen. F/U in 6 mo.      BMI 33.0-33.9,adult    Discussed making time to exercise. Plans on working out with husband this spring. The Saxenda was not effective.        Other Visit Diagnoses    Screening mammogram, encounter for       Relevant Orders   MM SCREENING BREAST TOMO BILATERAL     Hand arthritis - hx of JRA.  Will evaluate for adult RA.  Will do labs when due for labs in March, with sed rate, ANA and anti-ccp.  Will get plain films of hands as well too.   No orders of the defined types were placed in this encounter.   Follow-up: Return in about 6  months (around 04/21/2020) for Mood and medications. .     I discussed the assessment and treatment plan with the patient. The patient was provided an opportunity to ask questions and all were answered. The patient agreed with the plan and demonstrated an understanding of the instructions.   The patient was advised to call back or seek an in-person evaluation if the symptoms worsen or if the condition fails to improve as anticipated.    Beatrice Lecher, MD

## 2019-10-23 NOTE — Patient Instructions (Addendum)
Plan for labs in March to recheck vitamin D.  We will also check inflammatory markers and schedule for hand x-rays.  Please schedule mammogram.

## 2019-10-23 NOTE — Assessment & Plan Note (Signed)
Discussed making time to exercise. Plans on working out with husband this spring. The Saxenda was not effective.

## 2019-10-25 MED FILL — VIT D3-50 50,000 UNITS CAPS: 1.25 MG | 84 days supply | Qty: 12 | Fill #1

## 2019-11-09 DIAGNOSIS — M542 Cervicalgia: Secondary | ICD-10-CM | POA: Diagnosis not present

## 2019-11-09 DIAGNOSIS — M791 Myalgia, unspecified site: Secondary | ICD-10-CM | POA: Diagnosis not present

## 2019-11-09 DIAGNOSIS — G43709 Chronic migraine without aura, not intractable, without status migrainosus: Secondary | ICD-10-CM | POA: Diagnosis not present

## 2019-12-23 ENCOUNTER — Encounter (INDEPENDENT_AMBULATORY_CARE_PROVIDER_SITE_OTHER): Payer: Self-pay

## 2019-12-24 MED FILL — SERTRALINE HCL 100 MG TABS: 100 | 90 days supply | Qty: 180 | Fill #1

## 2019-12-27 ENCOUNTER — Other Ambulatory Visit: Payer: Self-pay

## 2019-12-27 ENCOUNTER — Ambulatory Visit
Admission: RE | Admit: 2019-12-27 | Discharge: 2019-12-27 | Disposition: A | Discharge status: Home / Self Care | Payer: 59 | Source: Ambulatory Visit | Attending: Family Medicine | Admitting: Family Medicine

## 2019-12-27 DIAGNOSIS — M791 Myalgia, unspecified site: Secondary | ICD-10-CM | POA: Diagnosis not present

## 2019-12-27 DIAGNOSIS — M542 Cervicalgia: Secondary | ICD-10-CM | POA: Diagnosis not present

## 2019-12-27 DIAGNOSIS — Z1231 Encounter for screening mammogram for malignant neoplasm of breast: Secondary | ICD-10-CM | POA: Diagnosis not present

## 2019-12-27 DIAGNOSIS — G43709 Chronic migraine without aura, not intractable, without status migrainosus: Secondary | ICD-10-CM | POA: Diagnosis not present

## 2019-12-27 MED FILL — ZONISAMIDE 100 MG CAPSULE: 100 | 90 days supply | Qty: 180 | Fill #0

## 2019-12-27 MED FILL — METAXALONE 800 MG TABLET: 800 | 70 days supply | Qty: 40 | Fill #0

## 2020-01-29 DIAGNOSIS — G43709 Chronic migraine without aura, not intractable, without status migrainosus: Secondary | ICD-10-CM | POA: Diagnosis not present

## 2020-01-29 DIAGNOSIS — M542 Cervicalgia: Secondary | ICD-10-CM | POA: Diagnosis not present

## 2020-01-29 DIAGNOSIS — M791 Myalgia, unspecified site: Secondary | ICD-10-CM | POA: Diagnosis not present

## 2020-02-28 DIAGNOSIS — M542 Cervicalgia: Secondary | ICD-10-CM | POA: Diagnosis not present

## 2020-02-28 DIAGNOSIS — G43709 Chronic migraine without aura, not intractable, without status migrainosus: Secondary | ICD-10-CM | POA: Diagnosis not present

## 2020-02-28 DIAGNOSIS — M791 Myalgia, unspecified site: Secondary | ICD-10-CM | POA: Diagnosis not present

## 2020-03-13 ENCOUNTER — Encounter: Payer: Self-pay | Admitting: Family Medicine

## 2020-03-14 ENCOUNTER — Ambulatory Visit (INDEPENDENT_AMBULATORY_CARE_PROVIDER_SITE_OTHER): Payer: 59 | Admitting: Family Medicine

## 2020-03-14 ENCOUNTER — Encounter: Payer: Self-pay | Admitting: Family Medicine

## 2020-03-14 VITALS — BP 127/76 | HR 64 | Ht 67.0 in

## 2020-03-14 DIAGNOSIS — R21 Rash and other nonspecific skin eruption: Secondary | ICD-10-CM

## 2020-03-14 MED ORDER — PREDNISONE 20 MG PO TABS: ORAL_TABLET | ORAL | 0 refills | Status: AC

## 2020-03-14 MED FILL — predniSONE 20 MG TABS: 20 | 10 days supply | Qty: 15 | Fill #0

## 2020-03-14 NOTE — Progress Notes (Signed)
Acute Office Visit  Subjective:    Patient ID: Rose Long, female    DOB: 06/30/1961, 59 y.o.   MRN: [id number]  Chief Complaint  Patient presents with  . Rash    HPI   Patient is in today for Rash.  She says she noticed the rash on Tuesday.  About 3 days after she had gone kayaking.  She does not remember any specific exposure to any type of plant etc.  But on Tuesday started to notice little bit of a raised red rash.  It started feeling kind of itchy and irritated today.  But she really has not had any pain or discomfort in the area she has not put anything on it.  Past Medical History:  Diagnosis Date  . Anemia   . Arthritis   . Bronchitis 2019  . Complication of anesthesia    PONV  . Family history of adverse reaction to anesthesia    pt's mother stopped breathing possibly due to sleep apnea  . Migraine   . Obesity   . Simple ovarian cyst    Right    Past Surgical History:  Procedure Laterality Date  . ABDOMINOPLASTY/PANNICULECTOMY    . BREAST SURGERY  1998   reduction  . BREAST SURGERY  10/2013   Lift, tummy tuck  . NASAL SINUS SURGERY  1999  . REDUCTION MAMMAPLASTY  1998  . REDUCTION MAMMAPLASTY  2017   Breast lift  . 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 TOTAL KNEE ARTHROPLASTY Left 09/30/2015   Procedure: TOTAL KNEE ARTHROPLASTY;  Surgeon: Melrose Nakayama, MD;  Location: [facility];  Service: Orthopedics;  Laterality: Left;  . under arm skin removed    . uterine ablation  2012    Family History  Problem Relation Age of Onset  . Hypertension Mother   . Bladder Cancer Mother   . Hyperlipidemia Mother   . Migraines Mother   . Obesity Mother   . Rheum arthritis Mother   . Hypertension Father   . CVA Father   . Heart failure Father   . Arthritis Father   . Hyperlipidemia Father   . Kidney failure Father   . Heart disease Maternal Grandfather   . Lung cancer Paternal Grandmother        lung  . Alcohol abuse Paternal Uncle   . Diabetes Cousin   .  Arthritis Sister   . Hypertension Brother   . Obesity Brother   . Rheum arthritis Brother     Social History   Socioeconomic History  . Marital status: Married    Spouse name: Not on file  . Number of children: 0  . Years of education: Masters  . Highest education level: Not on file  Occupational History    Comment: [employer]  Tobacco Use  . Smoking status: Never Smoker  . Smokeless tobacco: Never Used  Substance and Sexual Activity  . Alcohol use: Yes    Alcohol/week: 1.0 standard drinks    Types: 1 Standard drinks or equivalent per week    Comment: socially  . Drug use: No  . Sexual activity: Not Currently  Other Topics Concern  . Not on file  Social History Narrative   Parents moved into her home on June 2013. Lives with her spouse.  2 caffeine drinks per day.  Takes a 30 minute walk 7 days a week. Left-handed   Social Determinants of Health   Financial Resource Strain:   . Difficulty of Paying Living Expenses:  Food Insecurity:   . Worried About Charity fundraiser in the Last Year:   . Arboriculturist in the Last Year:   Transportation Needs:   . Film/video editor (Medical):   Marland Kitchen Lack of Transportation (Non-Medical):   Physical Activity:   . Days of Exercise per Week:   . Minutes of Exercise per Session:   Stress:   . Feeling of Stress :   Social Connections:   . Frequency of Communication with Friends and Family:   . Frequency of Social Gatherings with Friends and Family:   . Attends Religious Services:   . Active Member of Clubs or Organizations:   . Attends Archivist Meetings:   Marland Kitchen Marital Status:   Intimate Partner Violence:   . Fear of Current or Ex-Partner:   . Emotionally Abused:   Marland Kitchen Physically Abused:   . Sexually Abused:     Outpatient Medications Prior to Visit  Medication Sig Dispense Refill  . acetaminophen (TYLENOL) 650 MG CR tablet Take 1 tablet (650 mg total) by mouth every 8 (eight) hours as needed for pain. 90  tablet 3  . calcium citrate-vitamin D 200-200 MG-UNIT TABS Take 1 tablet by mouth daily.      . Cholecalciferol 1.25 MG (50000 UT) capsule Take 1 capsule (50,000 Units total) by mouth once a week. 12 capsule 1  . Diclofenac Sodium 2 % SOLN Place 2 sprays onto the skin 2 (two) times daily. 1 Bottle 11  . ferrous sulfate 325 (65 FE) MG tablet Take 325 mg by mouth daily with breakfast.    . MAGNESIUM CITRATE PO Take 1 tablet by mouth 2 (two) times daily.     . metaxalone (SKELAXIN) 800 MG tablet     . Multiple Vitamin (MULTIVITAMIN) tablet Take 1 tablet by mouth daily.      . sertraline (ZOLOFT) 100 MG tablet Take 2 tablets (200 mg total) by mouth daily. 180 tablet 1  . zonisamide (ZONEGRAN) 100 MG capsule      No facility-administered medications prior to visit.    Allergies  Allergen Reactions  . Sulfa Antibiotics Anaphylaxis  . Sulfamethoxazole-Trimethoprim Anaphylaxis, Swelling and Rash    Redness and swelling  . Bee Venom   . Trazodone And Nefazodone     headache    Review of Systems     Objective:    Physical Exam Vitals reviewed.  Constitutional:      Appearance: She is well-developed.  HENT:     Head: Normocephalic and atraumatic.  Eyes:     Conjunctiva/sclera: Conjunctivae normal.  Cardiovascular:     Rate and Rhythm: Normal rate.  Pulmonary:     Effort: Pulmonary effort is normal.  Skin:    General: Skin is dry.     Coloration: Skin is not pale.     Comments: Erythematous maculopapular rash with several of the areas showing up in a distinct linear pattern.  Mostly focal under the right axilla and going a little bit towards her shoulder blade on the right.  Neurological:     Mental Status: She is alert and oriented to person, place, and time.  Psychiatric:        Behavior: Behavior normal.     BP 127/76   Pulse 64   Ht 5\' 7"  (1.702 m)   SpO2 99%   BMI 33.67 kg/m  Wt Readings from Last 3 Encounters:  10/23/19 215 lb (97.5 kg)  05/22/19 218 lb 12.8 oz  (99.2  kg)  05/16/19 217 lb 4.8 oz (98.6 kg)    There are no preventive care reminders to display for this patient.  There are no preventive care reminders to display for this patient.   Lab Results  Component Value Date   TSH 2.46 07/24/2019   Lab Results  Component Value Date   WBC 5.6 07/24/2019   HGB 13.5 07/24/2019   HCT 40.9 07/24/2019   MCV 85.7 07/24/2019   PLT 316 07/24/2019   Lab Results  Component Value Date   NA 143 07/24/2019   K 3.9 07/24/2019   CO2 26 07/24/2019   GLUCOSE 94 07/24/2019   BUN 9 07/24/2019   CREATININE 0.84 07/24/2019   BILITOT 0.3 07/24/2019   ALKPHOS 70 06/02/2015   AST 13 07/24/2019   ALT 11 07/24/2019   PROT 7.0 07/24/2019   ALBUMIN 3.6 06/02/2015   CALCIUM 9.7 07/24/2019   ANIONGAP 9 09/19/2015   Lab Results  Component Value Date   CHOL 245 (H) 07/24/2019   Lab Results  Component Value Date   HDL 59 07/24/2019   Lab Results  Component Value Date   LDLCALC 164 (H) 07/24/2019   Lab Results  Component Value Date   TRIG 108 07/24/2019   Lab Results  Component Value Date   CHOLHDL 4.2 07/24/2019   No results found for: HGBA1C     Assessment & Plan:   Problem List Items Addressed This Visit    None    Visit Diagnoses    Rash    -  Primary    Rash-most consistent with contact dermatitis.  The rash has a very linear pattern to it though she does actually have vesicles some of which are weeping.  We will treat with oral prednisone.  Recommend cool packs and calamine lotion topically.  If not feeling better after the weekend and the rash is not improving then please let us know she is not had any other systemic symptoms such as fevers etc.  Though she did have a few chills last evening.   Meds ordered this encounter  Medications  . predniSONE (DELTASONE) 20 MG tablet    Sig: Take 2 tablets (40 mg total) by mouth daily with breakfast for 5 days, THEN 1 tablet (20 mg total) daily with breakfast for 5 days.    Dispense:   15 tablet    Refill:  0     Beatrice Lecher, MD

## 2020-03-14 NOTE — Progress Notes (Signed)
She reports that the rash started on 4 days ago. She had been Kayaking and questions if she came into contact with something at that time.   The affected area is her R axilla and spreading to her back.

## 2020-03-24 ENCOUNTER — Encounter: Payer: Self-pay | Admitting: Obstetrics & Gynecology

## 2020-03-24 ENCOUNTER — Other Ambulatory Visit: Payer: Self-pay

## 2020-03-24 ENCOUNTER — Other Ambulatory Visit (HOSPITAL_COMMUNITY)
Admission: RE | Admit: 2020-03-24 | Discharge: 2020-03-24 | Disposition: A | Discharge status: Home / Self Care | Payer: 59 | Source: Ambulatory Visit | Attending: Obstetrics & Gynecology | Admitting: Obstetrics & Gynecology

## 2020-03-24 ENCOUNTER — Ambulatory Visit (INDEPENDENT_AMBULATORY_CARE_PROVIDER_SITE_OTHER): Payer: 59 | Admitting: Obstetrics & Gynecology

## 2020-03-24 VITALS — BP 117/73 | HR 68 | Resp 16 | Ht 66.5 in

## 2020-03-24 DIAGNOSIS — Z780 Asymptomatic menopausal state: Secondary | ICD-10-CM | POA: Diagnosis not present

## 2020-03-24 DIAGNOSIS — Z01419 Encounter for gynecological examination (general) (routine) without abnormal findings: Secondary | ICD-10-CM

## 2020-03-24 NOTE — Progress Notes (Signed)
Subjective:     Rose Long is a 59 y.o. female here for a routine exam.  Current complaints: none.     Gynecologic History No LMP recorded. Patient has had an ablation. Contraception: post menopausal status Last Pap: 2015. Results were: normal Last mammogram: 3/21. Results were: normal  Obstetric History OB History  Gravida Para Term Preterm AB Living  0 0 0 0 0 0  SAB TAB Ectopic Multiple Live Births  0 0 0 0       The following portions of the patient's history were reviewed and updated as appropriate: allergies, current medications, past family history, past medical history, past social history, past surgical history and problem list.  Review of Systems Pertinent items noted in HPI and remainder of comprehensive ROS otherwise negative.    Objective:      Vitals:   03/24/20 1452  BP: 117/73  Pulse: 68  Resp: 16  Height: 5' 6.5" (1.689 m)   Vitals:  WNL General appearance: alert, cooperative and no distress  HEENT: Normocephalic, without obvious abnormality, atraumatic Eyes: negative Throat: lips, mucosa, and tongue normal; teeth and gums normal  Respiratory: Clear to auscultation bilaterally  CV: Regular rate and rhythm  Breasts:  Normal appearance, no masses or tenderness, no nipple retraction or dimpling.  S/p breast reduction  GI: Soft, non-tender; bowel sounds normal; no masses,  no organomegaly; s/p abdominoplasty after roux n y by pass.  GU: External Genitalia:  Tanner V, no lesion Urethra:  No prolapse   Vagina: Pink, normal rugae, no blood or discharge  Cervix: No CMT, no lesion  Uterus:  Non tender, limited by habitus/abdominoplasty  Adnexa: Non tender, limited by habitus/abdominoplasty  Musculoskeletal: No edema, redness or tenderness in the calves or thighs  Skin: No lesions or rash  Lymphatic: Axillary adenopathy: none     Psychiatric: Normal mood and behavior        Assessment:    Healthy female exam.    Plan:    1.  Pap with  cotesting 2.  Mammogram nml 3/21 3.  Colonoscopy (was to rpt in 5 years)--followed  by PCP

## 2020-03-25 LAB — CYTOLOGY - PAP
Comment: NEGATIVE
Diagnosis: NEGATIVE
High risk HPV: NEGATIVE

## 2020-03-26 DIAGNOSIS — M542 Cervicalgia: Secondary | ICD-10-CM | POA: Diagnosis not present

## 2020-03-26 DIAGNOSIS — G43709 Chronic migraine without aura, not intractable, without status migrainosus: Secondary | ICD-10-CM | POA: Diagnosis not present

## 2020-03-26 DIAGNOSIS — M791 Myalgia, unspecified site: Secondary | ICD-10-CM | POA: Diagnosis not present

## 2020-03-31 ENCOUNTER — Other Ambulatory Visit: Payer: Self-pay | Admitting: Family Medicine

## 2020-03-31 MED FILL — SERTRALINE HCL 100 MG TABS: 100 | 90 days supply | Qty: 180 | Fill #0

## 2020-05-05 ENCOUNTER — Ambulatory Visit: Payer: 59 | Admitting: Family Medicine

## 2020-05-06 ENCOUNTER — Ambulatory Visit (INDEPENDENT_AMBULATORY_CARE_PROVIDER_SITE_OTHER): Payer: 59 | Admitting: Physician Assistant

## 2020-05-06 ENCOUNTER — Other Ambulatory Visit: Payer: Self-pay

## 2020-05-06 ENCOUNTER — Encounter: Payer: Self-pay | Admitting: Physician Assistant

## 2020-05-06 VITALS — BP 127/83 | HR 65 | Ht 66.5 in | Wt 217.0 lb

## 2020-05-06 DIAGNOSIS — T753XXS Motion sickness, sequela: Secondary | ICD-10-CM

## 2020-05-06 DIAGNOSIS — Z298 Encounter for other specified prophylactic measures: Secondary | ICD-10-CM

## 2020-05-06 DIAGNOSIS — Z7184 Encounter for health counseling related to travel: Secondary | ICD-10-CM

## 2020-05-06 MED ORDER — MUPIROCIN 2 % EX OINT: 1.0000 "application " | TOPICAL_OINTMENT | Freq: Three times a day (TID) | CUTANEOUS | 0 refills | Status: DC

## 2020-05-06 MED ORDER — ATOVAQUONE-PROGUANIL HCL 250-100 MG PO TABS: 1.0000 | ORAL_TABLET | Freq: Every day | ORAL | 0 refills | Status: DC

## 2020-05-06 MED ORDER — ONDANSETRON HCL 8 MG PO TABS: 8.0000 mg | ORAL_TABLET | Freq: Three times a day (TID) | ORAL | 0 refills | Status: DC | PRN

## 2020-05-06 MED ORDER — CIPROFLOXACIN HCL 500 MG PO TABS: 500.0000 mg | ORAL_TABLET | Freq: Two times a day (BID) | ORAL | 0 refills | Status: DC

## 2020-05-06 MED ORDER — SCOPOLAMINE 1 MG/3DAYS TD PT72: 1.0000 | MEDICATED_PATCH | TRANSDERMAL | 0 refills | Status: DC

## 2020-05-06 MED FILL — ONDANSETRON HCL 8 MG TABLET: 8 | 6 days supply | Qty: 20 | Fill #0

## 2020-05-06 MED FILL — ATOVAQUONE-PROGUANIL 250-10: 250-100 | 30 days supply | Qty: 30 | Fill #0

## 2020-05-06 MED FILL — SCOPOLAMINE 1 MG/3 DAY PATC: 1 | 12 days supply | Qty: 4 | Fill #0

## 2020-05-06 MED FILL — CIPROFLOXACIN HCL 500 MG TA: 500 | 10 days supply | Qty: 20 | Fill #0

## 2020-05-06 MED FILL — MUPIROCIN 2% OINTMENT: 2 | 10 days supply | Qty: 22 | Fill #0

## 2020-05-06 NOTE — Progress Notes (Signed)
Subjective:    Patient ID: Rose Long, female    DOB: [birth year], 59 y.o.   MRN: [id number]  HPI  Pt is a 59 yo female who is going to [place] on August 7th for one week for mission work. She would like to discuss medications and vaccines she might need.   .. Active Ambulatory Problems    Diagnosis Date Noted  . Migraine 11/23/2011  . Insomnia 08/06/2011  . Obesity 08/06/2011  . Primary osteoarthritis of right knee 05/04/2013  . Low back pain 05/21/2013  . Trochanteric bursitis of both hips 06/12/2013  . Abnormal facial hair 08/03/2013  . Hemorrhoids 06/12/2014  . Anemia 06/12/2014  . Primary osteoarthritis of left wrist 07/28/2015  . History of left knee replacement 11/25/2015  . Restless legs syndrome 07/19/2016  . 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 Rheumatoid arthritis ([place]) 08/15/2008  . Vitamin D deficiency 10/23/2019  . 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 Situational anxiety 10/23/2019  . BMI 33.0-33.9,adult 10/23/2019  . 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 OTITIS MEDIA, ACUTE, LEFT 10/05/2009  . URI 06/27/2009  . Rheumatoid arthritis(714.0) 08/15/2008  . CONTACT DERMATITIS&OTHER ECZEMA DUE UNSPEC CAUSE 05/03/2011  . Throat pain 05/03/2011  . Chronic daily headache 11/23/2011  . Muscle spasm 11/23/2011  . Metatarsalgia of left foot 05/04/2013  . Olecranon bursitis of right elbow 06/16/2015  . Primary osteoarthritis of left knee 09/30/2015  . Primary osteoarthritis of knee 09/30/2015  . Closed fracture of proximal phalanx of right little finger 06/17/2016   Past Medical History:  Diagnosis Date  . Arthritis   . Bronchitis 2019  . Complication of anesthesia   . Family history of adverse reaction to anesthesia   . Simple ovarian cyst        Review of Systems  All other systems reviewed and are negative.      Objective:   Physical  Exam Vitals reviewed.  Constitutional:      Appearance: Normal appearance.  Cardiovascular:     Rate and Rhythm: Normal rate.  Pulmonary:     Effort: Pulmonary effort is normal.  Neurological:     General: No focal deficit present.     Mental Status: She is alert.  Psychiatric:        Mood and Affect: Mood normal.           Assessment & Plan:  Rose Long KitchenMarland KitchenAmarria was seen today for follow-up.  Diagnoses and all orders for this visit:  Counseling for travel -     atovaquone-proguanil (MALARONE) 250-100 MG TABS tablet; Take 1 tablet by mouth daily. Start 1 day before travel and D/C 7 days after return home -     ondansetron (ZOFRAN) 8 MG tablet; Take 1 tablet (8 mg total) by mouth every 8 (eight) hours as needed for nausea or vomiting. -     ciprofloxacin (CIPRO) 500 MG tablet; Take 1 tablet (500 mg total) by mouth 2 (two) times daily. -     mupirocin ointment (BACTROBAN) 2 %; Apply 1 application topically 3 (three) times daily. Apply to affected area for 7-10 days. -     scopolamine (TRANSDERM-SCOP, 1.5 MG,) 1 MG/3DAYS; Place 1 patch (1.5 mg total) onto the skin every 3 (three) days.  Need for malaria prophylaxis -     atovaquone-proguanil (MALARONE) 250-100 MG TABS tablet; Take 1 tablet by mouth daily. Start 1 day before travel and D/C  7 days after return home  Motion sickness, sequela -     ondansetron (ZOFRAN) 8 MG tablet; Take 1 tablet (8 mg total) by mouth every 8 (eight) hours as needed for nausea or vomiting. -     scopolamine (TRANSDERM-SCOP, 1.5 MG,) 1 MG/3DAYS; Place 1 patch (1.5 mg total) onto the skin every 3 (three) days.   Pt is up to date on:  Routine vaccinations Influenza Covid 19 Tdap Typhoid 2016-good for 5 years.   She was sent rx for malaria.  zofran to use as needed for nausea.  cipro for UTI or travelors diarrhea.  bactroban for superficial wounds.  Transdermal patches for motion sickness  Encouraged tic and mosiquto protection.  Drink from only  bottled water.   Spent 30 minutes with patient.

## 2020-05-19 ENCOUNTER — Ambulatory Visit: Payer: 59 | Admitting: Family Medicine

## 2020-05-21 DIAGNOSIS — Z20822 Contact with and (suspected) exposure to covid-19: Secondary | ICD-10-CM | POA: Diagnosis not present

## 2020-05-29 ENCOUNTER — Ambulatory Visit (INDEPENDENT_AMBULATORY_CARE_PROVIDER_SITE_OTHER): Payer: 59 | Admitting: Sports Medicine

## 2020-05-29 DIAGNOSIS — Z96652 Presence of left artificial knee joint: Secondary | ICD-10-CM | POA: Diagnosis not present

## 2020-05-29 NOTE — Assessment & Plan Note (Signed)
This is a very pleasant 59 year old female, she is one of our behavioral therapist, she is currently on a medical mission in [place], about 40 minutes ago she fell impacting the anterior aspect of her left knee, she developed rapid swelling and a palpable bubble that is tender on the anterior aspect of her kneecap, it feels movable, no fevers, chills warmth, no pain or swelling in the lower leg or cramping in the calf. I think she simply has a traumatic prepatellar bursitis, I have advised that she compress this as well as ice it 20 minutes 3-4 times a day, and come see me when she returns to the [place], we will likely aspirate or doing hematoma evacuation.  Her main concern was whether or not this could travel to her lungs as a pulmonary embolism, I do not think this is likely.

## 2020-05-29 NOTE — Progress Notes (Signed)
   Virtual Visit via Telephone   I connected with  [name]  on 05/29/20 by telephone/telehealth and verified that I am speaking with the correct person using two identifiers.   I discussed the limitations, risks, security and privacy concerns of performing an evaluation and management service by telephone, including the higher likelihood of inaccurate diagnosis and treatment, and the availability of in person appointments.  We also discussed the likely need of an additional face to face encounter for complete and high quality delivery of care.  I also discussed with the patient that there may be a patient responsible charge related to this service. The patient expressed understanding and wishes to proceed.  Provider location is in medical facility. Patient location is at their home, different from provider location. People involved in care of the patient during this telehealth encounter were myself, my nurse/medical assistant, and my front office/scheduling team member.  Review of Systems: No fevers, chills, night sweats, weight loss, chest pain, or shortness of breath.   Objective Findings:    General: Speaking full sentences, no audible heavy breathing.  Sounds alert and appropriately interactive.    Independent interpretation of tests performed by another provider:   None.  Brief History, Exam, Impression, and Recommendations:    History of left knee replacement This is a very pleasant 59 year old female, she is one of our behavioral therapist, she is currently on a medical mission in [place], about 40 minutes ago she fell impacting the anterior aspect of her left knee, she developed rapid swelling and a palpable bubble that is tender on the anterior aspect of her kneecap, it feels movable, no fevers, chills warmth, no pain or swelling in the lower leg or cramping in the calf. I think she simply has a traumatic prepatellar bursitis, I have advised that she compress this as  well as ice it 20 minutes 3-4 times a day, and come see me when she returns to the [place], we will likely aspirate or doing hematoma evacuation.  Her main concern was whether or not this could travel to her lungs as a pulmonary embolism, I do not think this is likely.   I discussed the above assessment and treatment plan with the patient. The patient was provided an opportunity to ask questions and all were answered. The patient agreed with the plan and demonstrated an understanding of the instructions.   The patient was advised to call back or seek an in-person evaluation if the symptoms worsen or if the condition fails to improve as anticipated.   I provided 30 minutes of face to face and non-face-to-face time during this encounter date, time was needed to gather information, review chart, records, communicate/coordinate with staff remotely, as well as complete documentation.   ___________________________________________ Gwen Her. Dianah Field, M.D., ABFM., CAQSM. Primary Care and Sports Medicine [facility]  Adjunct Professor of [employer]

## 2020-06-03 ENCOUNTER — Other Ambulatory Visit: Payer: Self-pay

## 2020-06-03 ENCOUNTER — Encounter (HOSPITAL_BASED_OUTPATIENT_CLINIC_OR_DEPARTMENT_OTHER): Payer: Self-pay | Admitting: *Deleted

## 2020-06-03 ENCOUNTER — Emergency Department (HOSPITAL_BASED_OUTPATIENT_CLINIC_OR_DEPARTMENT_OTHER)
Admission: EM | Admit: 2020-06-03 | Discharge: 2020-06-04 | Disposition: A | Discharge status: Home / Self Care | Payer: 59 | Attending: Emergency Medicine | Admitting: Emergency Medicine

## 2020-06-03 ENCOUNTER — Emergency Department (HOSPITAL_BASED_OUTPATIENT_CLINIC_OR_DEPARTMENT_OTHER): Payer: 59

## 2020-06-03 DIAGNOSIS — U071 COVID-19: Secondary | ICD-10-CM | POA: Diagnosis not present

## 2020-06-03 DIAGNOSIS — R05 Cough: Secondary | ICD-10-CM | POA: Diagnosis not present

## 2020-06-03 DIAGNOSIS — G43409 Hemiplegic migraine, not intractable, without status migrainosus: Secondary | ICD-10-CM | POA: Insufficient documentation

## 2020-06-03 LAB — SARS CORONAVIRUS 2 BY RT PCR (HOSPITAL ORDER, PERFORMED IN ~~LOC~~ HOSPITAL LAB): SARS Coronavirus 2: POSITIVE — AB

## 2020-06-03 MED ORDER — ACETAMINOPHEN 325 MG PO TABS
650.0000 mg | ORAL_TABLET | Freq: Once | ORAL | Status: AC
  Administered 2020-06-03: 650 mg via ORAL

## 2020-06-03 MED ORDER — KETOROLAC TROMETHAMINE 15 MG/ML IJ SOLN
15.0000 mg | Freq: Once | INTRAMUSCULAR | Status: AC
  Administered 2020-06-04: 15 mg via INTRAVENOUS
  Filled 2020-06-03: qty 1

## 2020-06-03 MED ORDER — SODIUM CHLORIDE 0.9 % IV BOLUS
1000.0000 mL | Freq: Once | INTRAVENOUS | Status: AC
  Administered 2020-06-04: 1000 mL via INTRAVENOUS

## 2020-06-03 MED ORDER — ACETAMINOPHEN 325 MG PO TABS
ORAL_TABLET | ORAL | Status: AC
  Filled 2020-06-03: qty 2

## 2020-06-03 MED ORDER — DIPHENHYDRAMINE HCL 50 MG/ML IJ SOLN
25.0000 mg | Freq: Once | INTRAMUSCULAR | Status: AC
  Administered 2020-06-04: 25 mg via INTRAVENOUS
  Filled 2020-06-03: qty 1

## 2020-06-03 MED ORDER — METOCLOPRAMIDE HCL 5 MG/ML IJ SOLN
10.0000 mg | Freq: Once | INTRAMUSCULAR | Status: AC
  Administered 2020-06-04: 10 mg via INTRAVENOUS
  Filled 2020-06-03: qty 2

## 2020-06-03 NOTE — ED Provider Notes (Signed)
[facility] DEPT [facility] Provider Note: Georgena Spurling, MD, FACEP  CSN: [id number] MRN: [id number] ARRIVAL: 06/03/20 at [place]: [place]  URI   HISTORY OF PRESENT ILLNESS  06/03/20 11:34 PM Rose Long is a 59 y.o. female with a 2-day history of body aches, chills, cough and headache.  She describes the headache as severe and similar to previous migraines.  It is involving her entire head.  It is associated with photophobia and nausea but no vomiting.  She has taken sumatriptan without relief.  She was given acetaminophen on arrival without significant improvement.  She has had recent Covid exposure.  She denies loss of taste or smell.   Past Medical History:  Diagnosis Date  . Anemia   . Arthritis   . Bronchitis 2019  . Complication of anesthesia    PONV  . Family history of adverse reaction to anesthesia    pt's mother stopped breathing possibly due to sleep apnea  . Migraine   . Obesity   . Simple ovarian cyst    Right    Past Surgical History:  Procedure Laterality Date  . ABDOMINOPLASTY/PANNICULECTOMY    . BREAST SURGERY  1998   reduction  . BREAST SURGERY  10/2013   Lift, tummy tuck  . NASAL SINUS SURGERY  1999  . REDUCTION MAMMAPLASTY  1998  . REDUCTION MAMMAPLASTY  2017   Breast lift  . 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 TOTAL KNEE ARTHROPLASTY Left 09/30/2015   Procedure: TOTAL KNEE ARTHROPLASTY;  Surgeon: Melrose Nakayama, MD;  Location: [facility];  Service: Orthopedics;  Laterality: Left;  . under arm skin removed    . uterine ablation  2012    Family History  Problem Relation Age of Onset  . Hypertension Mother   . Bladder Cancer Mother   . Hyperlipidemia Mother   . Migraines Mother   . Obesity Mother   . Rheum arthritis Mother   . Hypertension Father   . CVA Father   . Heart failure Father   . Arthritis Father   . Hyperlipidemia Father   . Kidney failure Father   . Heart disease Maternal Grandfather   . Lung cancer  Paternal Grandmother        lung  . Alcohol abuse Paternal Uncle   . Diabetes Cousin   . Arthritis Sister   . Hypertension Brother   . Obesity Brother   . Rheum arthritis Brother     Social History   Tobacco Use  . Smoking status: Never Smoker  . Smokeless tobacco: Never Used  Vaping Use  . Vaping Use: Never used  Substance Use Topics  . Alcohol use: Yes    Alcohol/week: 1.0 standard drink    Types: 1 Standard drinks or equivalent per week    Comment: socially  . Drug use: No    Prior to Admission medications   Medication Sig Start Date End Date Taking? Authorizing Provider  acetaminophen (TYLENOL) 650 MG CR tablet Take 1 tablet (650 mg total) by mouth every 8 (eight) hours as needed for pain. 01/02/19   Silverio Decamp, MD  atovaquone-proguanil (MALARONE) 250-100 MG TABS tablet Take 1 tablet by mouth daily. Start 1 day before travel and D/C 7 days after return home 05/06/20   Iran Planas L, PA-C  calcium citrate-vitamin D 200-200 MG-UNIT TABS Take 1 tablet by mouth daily.      [provider]  Cholecalciferol 1.25 MG (50000 UT) capsule Take 1 capsule (  50,000 Units total) by mouth once a week. 07/31/19   Hali Marry, MD  ciprofloxacin (CIPRO) 500 MG tablet Take 1 tablet (500 mg total) by mouth 2 (two) times daily. 05/06/20   Breeback, Luvenia Starch L, PA-C  Diclofenac Sodium 2 % SOLN Place 2 sprays onto the skin 2 (two) times daily. 02/05/19   Silverio Decamp, MD  ferrous sulfate 325 (65 FE) MG tablet Take 325 mg by mouth daily with breakfast.    [provider]  MAGNESIUM CITRATE PO Take 1 tablet by mouth 2 (two) times daily.     [provider]  metaxalone (SKELAXIN) 800 MG tablet  07/31/19   [provider]  Multiple Vitamin (MULTIVITAMIN) tablet Take 1 tablet by mouth daily.      [provider]  mupirocin ointment (BACTROBAN) 2 % Apply 1 application topically 3 (three) times daily. Apply to affected area for 7-10  days. 05/06/20   Breeback, Jade L, PA-C  ondansetron (ZOFRAN) 8 MG tablet Take 1 tablet (8 mg total) by mouth every 8 (eight) hours as needed for nausea or vomiting. 05/06/20   Breeback, Jade L, PA-C  scopolamine (TRANSDERM-SCOP, 1.5 MG,) 1 MG/3DAYS Place 1 patch (1.5 mg total) onto the skin every 3 (three) days. 05/06/20   Breeback, Jade L, PA-C  sertraline (ZOLOFT) 100 MG tablet TAKE TWO TABLETS BY MOUTH DAILY 03/31/20   Hali Marry, MD  zonisamide [facility]) 100 MG capsule  06/11/19   [provider]    Allergies Sulfa antibiotics, Sulfamethoxazole-trimethoprim, Bee venom, and Trazodone and nefazodone   REVIEW OF SYSTEMS  Negative except as noted here or in the History of Present Illness.   PHYSICAL EXAMINATION  Initial Vital Signs Blood pressure 115/80, pulse 82, temperature 98.5 F (36.9 C), temperature source Oral, resp. rate 16, height 5\' 7"  (1.702 m), weight 95.7 kg, SpO2 94 %.  Examination General: Well-developed, well-nourished female in no acute distress; appearance consistent with age of record HENT: normocephalic; atraumatic Eyes: pupils equal, round and reactive to light; extraocular muscles intact; photophobia Neck: supple Heart: regular rate and rhythm Lungs: clear to auscultation bilaterally Abdomen: soft; nondistended; nontender; bowel sounds present Extremities: No deformity; full range of motion; pulses normal Neurologic: Awake, alert and oriented; motor function intact in all extremities and symmetric; no facial droop Skin: Warm and dry Psychiatric: Normal mood and affect   RESULTS  Summary of this visit's results, reviewed and interpreted by myself:   EKG Interpretation  Date/Time:    Ventricular Rate:    PR Interval:    QRS Duration:   QT Interval:    QTC Calculation:   R Axis:     Text Interpretation:        Laboratory Studies: Results for orders placed or performed during the hospital encounter of 06/03/20 (from the past 24  hour(s))  SARS Coronavirus 2 by RT PCR (hospital order, performed in [facility] hospital lab) Nasopharyngeal Nasopharyngeal Swab     Status: Abnormal   Collection Time: 06/03/20  6:18 PM   Specimen: Nasopharyngeal Swab  Result Value Ref Range   SARS Coronavirus 2 POSITIVE (A) NEGATIVE   Imaging Studies: DG Chest Portable 1 View  Result Date: 06/03/2020 CLINICAL DATA:  Cough. EXAM: PORTABLE CHEST 1 VIEW COMPARISON:  January 30, 2018 FINDINGS: There is no evidence of acute infiltrate, pleural effusion or pneumothorax. The heart size and mediastinal contours are within normal limits. The visualized skeletal structures are unremarkable. IMPRESSION: No active disease. Electronically Signed  By: Virgina Norfolk M.D.   On: 06/03/2020 19:11    ED COURSE and MDM  Nursing notes, initial and subsequent vitals signs, including pulse oximetry, reviewed and interpreted by myself.  Vitals:   06/03/20 1813 06/03/20 1816 06/03/20 2212 06/03/20 2314  BP:  135/69 115/80   Pulse:  (!) 105 82   Resp:   16   Temp:    98.5 F (36.9 C)  TempSrc:    Oral  SpO2:  96% 94%   Weight: 95.7 kg     Height: 5\' 7"  (1.702 m)      Medications  acetaminophen (TYLENOL) tablet 650 mg (650 mg Oral Given 06/03/20 2025)  sodium chloride 0.9 % bolus 1,000 mL (1,000 mLs Intravenous New Bag/Given 06/04/20 0003)  diphenhydrAMINE (BENADRYL) injection 25 mg (25 mg Intravenous Given 06/04/20 0003)  metoCLOPramide (REGLAN) injection 10 mg (10 mg Intravenous Given 06/04/20 0003)  ketorolac (TORADOL) 15 MG/ML injection 15 mg (15 mg Intravenous Given 06/04/20 0003)   1:22 AM Patient states she still has a headache but is ready to go home.  We have referred her to the outpatient monoclonal antibody infusion clinic and a message has been sent.   PROCEDURES  Procedures   ED DIAGNOSES     ICD-10-CM   1. COVID-19 virus infection  U07.1   2. Sporadic migraine  G43.[place]        Aditri Louischarles, MD 06/04/20 [phone]

## 2020-06-03 NOTE — ED Triage Notes (Signed)
C/o Uri symptoms and h/a x 2 days

## 2020-06-04 ENCOUNTER — Telehealth: Payer: Self-pay | Admitting: Oncology

## 2020-06-04 DIAGNOSIS — U071 COVID-19: Secondary | ICD-10-CM | POA: Diagnosis not present

## 2020-06-04 DIAGNOSIS — G43409 Hemiplegic migraine, not intractable, without status migrainosus: Secondary | ICD-10-CM | POA: Diagnosis not present

## 2020-06-04 NOTE — Telephone Encounter (Signed)
Called to Discuss with patient about Covid symptoms and the use of regeneron, a monoclonal antibody infusion for those with mild to moderate Covid symptoms and at a high risk of hospitalization.     Pt is qualified for this infusion at the [facility] infusion center due to co-morbid conditions and/or a member of an at-risk group.     Unable to reach pt. Left message to return call  Jenny Mariesha Venturella, AGNP-C [phone] (Infusion Center Hotline)  

## 2020-06-05 ENCOUNTER — Other Ambulatory Visit (HOSPITAL_COMMUNITY): Payer: Self-pay | Admitting: Oncology

## 2020-06-05 ENCOUNTER — Ambulatory Visit (HOSPITAL_COMMUNITY)
Admission: RE | Admit: 2020-06-05 | Discharge: 2020-06-05 | Disposition: A | Discharge status: Home / Self Care | Payer: 59 | Source: Ambulatory Visit | Attending: Pulmonary Disease | Admitting: Pulmonary Disease

## 2020-06-05 ENCOUNTER — Encounter: Payer: Self-pay | Admitting: Oncology

## 2020-06-05 DIAGNOSIS — U071 COVID-19: Secondary | ICD-10-CM

## 2020-06-05 MED ORDER — SODIUM CHLORIDE 0.9 % IV SOLN
1200.0000 mg | Freq: Once | INTRAVENOUS | Status: AC
  Administered 2020-06-05: 1200 mg via INTRAVENOUS
  Filled 2020-06-05: qty 10

## 2020-06-05 MED ORDER — ALBUTEROL SULFATE HFA 108 (90 BASE) MCG/ACT IN AERS: 2.0000 | INHALATION_SPRAY | Freq: Once | RESPIRATORY_TRACT | Status: DC | PRN

## 2020-06-05 MED ORDER — FAMOTIDINE IN NACL 20-0.9 MG/50ML-% IV SOLN: 20.0000 mg | Freq: Once | INTRAVENOUS | Status: DC | PRN

## 2020-06-05 MED ORDER — EPINEPHRINE 0.3 MG/0.3ML IJ SOAJ: 0.3000 mg | Freq: Once | INTRAMUSCULAR | Status: DC | PRN

## 2020-06-05 MED ORDER — SODIUM CHLORIDE 0.9 % IV SOLN: INTRAVENOUS | Status: DC | PRN

## 2020-06-05 MED ORDER — METHYLPREDNISOLONE SODIUM SUCC 125 MG IJ SOLR: 125.0000 mg | Freq: Once | INTRAMUSCULAR | Status: DC | PRN

## 2020-06-05 MED ORDER — DIPHENHYDRAMINE HCL 50 MG/ML IJ SOLN: 50.0000 mg | Freq: Once | INTRAMUSCULAR | Status: DC | PRN

## 2020-06-05 NOTE — Progress Notes (Signed)
I connected by phone with  Rose Long on 06/05/20 at 09:45am to discuss the potential use of an new treatment for mild to moderate COVID-19 viral infection in non-hospitalized patients.   This patient is a age/sex that meets the FDA criteria for Emergency Use Authorization of casirivimab\imdevimab.  Has a (+) direct SARS-CoV-2 viral test result 1. Has mild or moderate COVID-19  2. Is ? 58 years of age and weighs ? 40 kg 3. Is NOT hospitalized due to COVID-19 4. Is NOT requiring oxygen therapy or requiring an increase in baseline oxygen flow rate due to COVID-19 5. Is within 10 days of symptom onset 6. Has at least one of the high risk factor(s) for progression to severe COVID-19 and/or hospitalization as defined in EUA. ? Specific high risk criteria :Obesity   Symptom onset  06/01/20   I have spoken and communicated the following to the patient or parent/caregiver:   1. FDA has authorized the emergency use of casirivimab\imdevimab for the treatment of mild to moderate COVID-19 in adults and pediatric patients with positive results of direct SARS-CoV-2 viral testing who are 93 years of age and older weighing at least 40 kg, and who are at high risk for progressing to severe COVID-19 and/or hospitalization.   2. The significant known and potential risks and benefits of casirivimab\imdevimab, and the extent to which such potential risks and benefits are unknown.   3. Information on available alternative treatments and the risks and benefits of those alternatives, including clinical trials.   4. Patients treated with casirivimab\imdevimab should continue to self-isolate and use infection control measures (e.g., wear mask, isolate, social distance, avoid sharing personal items, clean and disinfect "high touch" surfaces, and frequent handwashing) according to CDC guidelines.    5. The patient or parent/caregiver has the option to accept or refuse casirivimab\imdevimab .   After reviewing this  information with the patient, The patient agreed to proceed with receiving casirivimab\imdevimab infusion and will be provided a copy of the Fact sheet prior to receiving the infusion.Rulon Abide, AGNP-C [phone] ([place])

## 2020-06-05 NOTE — Progress Notes (Signed)
  Diagnosis: COVID-19  Physician: Dr. Asencion Noble  Procedure: Covid Infusion Clinic Med: casirivimab\imdevimab infusion - Provided patient with casirivimab\imdevimab fact sheet for patients, parents and caregivers prior to infusion.  Complications: No immediate complications noted.  Discharge: Discharged home   Rose Long 06/05/2020

## 2020-06-05 NOTE — Discharge Instructions (Signed)

## 2020-06-16 ENCOUNTER — Ambulatory Visit (INDEPENDENT_AMBULATORY_CARE_PROVIDER_SITE_OTHER): Payer: 59 | Admitting: Physician Assistant

## 2020-06-16 ENCOUNTER — Encounter: Payer: Self-pay | Admitting: Physician Assistant

## 2020-06-16 VITALS — BP 110/69 | HR 93 | Temp 99.1°F | Ht 67.0 in | Wt 207.0 lb

## 2020-06-16 DIAGNOSIS — R05 Cough: Secondary | ICD-10-CM

## 2020-06-16 DIAGNOSIS — H9201 Otalgia, right ear: Secondary | ICD-10-CM

## 2020-06-16 DIAGNOSIS — R058 Other specified cough: Secondary | ICD-10-CM

## 2020-06-16 DIAGNOSIS — J0100 Acute maxillary sinusitis, unspecified: Secondary | ICD-10-CM | POA: Diagnosis not present

## 2020-06-16 DIAGNOSIS — Z8616 Personal history of COVID-19: Secondary | ICD-10-CM

## 2020-06-16 MED ORDER — FLUTICASONE PROPIONATE 50 MCG/ACT NA SUSP: 2.0000 | Freq: Every day | NASAL | 0 refills | Status: DC

## 2020-06-16 MED ORDER — HYDROCODONE-HOMATROPINE 5-1.5 MG/5ML PO SYRP: 5.0000 mL | ORAL_SOLUTION | Freq: Three times a day (TID) | ORAL | 0 refills | Status: DC | PRN

## 2020-06-16 MED ORDER — AMOXICILLIN-POT CLAVULANATE 875-125 MG PO TABS: 1.0000 | ORAL_TABLET | Freq: Two times a day (BID) | ORAL | 0 refills | Status: DC

## 2020-06-16 MED FILL — HYDROCODONE-HOMATROPINE SOL: 5-1.5 | 4 days supply | Qty: 60 | Fill #0

## 2020-06-16 MED FILL — FLUTICASONE PROP 50 MCG SPR: 50 | 30 days supply | Qty: 16 | Fill #0

## 2020-06-16 MED FILL — AMOX-CLAV 875-125 MG TABLET: 875-125 | 10 days supply | Qty: 20 | Fill #0

## 2020-06-16 NOTE — Progress Notes (Signed)
Subjective:    Patient ID: Rose Long, female    DOB: 1961-10-01, 59 y.o.   MRN: [id number]  HPI  Pt is a 59 yo female with sinus pressure, right ear pain, post nasal drip and post viral cough. She was dx with covid on 8/17. She had infusion and it helped a lot. Her energy is coming back. She continues to have some lingering symptoms. She is having sharp, shooting pain in right ear that woke her up last night. She is taking tylenol and mucinex. No fever, chills, body aches.   Pt was vaccinated from covid earlier this year.  .. Active Ambulatory Problems    Diagnosis Date Noted  . Migraine 11/23/2011  . Insomnia 08/06/2011  . Obesity 08/06/2011  . Primary osteoarthritis of right knee 05/04/2013  . Low back pain 05/21/2013  . Trochanteric bursitis of both hips 06/12/2013  . Abnormal facial hair 08/03/2013  . Hemorrhoids 06/12/2014  . Anemia 06/12/2014  . Primary osteoarthritis of left wrist 07/28/2015  . History of left knee replacement 11/25/2015  . Restless legs syndrome 07/19/2016  . 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 Rheumatoid arthritis ([place]) 08/15/2008  . Vitamin D deficiency 10/23/2019  . 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 Situational anxiety 10/23/2019  . BMI 33.0-33.9,adult 10/23/2019  . 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 OTITIS MEDIA, ACUTE, LEFT 10/05/2009  . URI 06/27/2009  . Rheumatoid arthritis(714.0) 08/15/2008  . CONTACT DERMATITIS&OTHER ECZEMA DUE UNSPEC CAUSE 05/03/2011  . Throat pain 05/03/2011  . Chronic daily headache 11/23/2011  . Muscle spasm 11/23/2011  . Metatarsalgia of left foot 05/04/2013  . Olecranon bursitis of right elbow 06/16/2015  . Primary osteoarthritis of left knee 09/30/2015  . Primary osteoarthritis of knee 09/30/2015  . Closed fracture of proximal phalanx of right little finger 06/17/2016   Past Medical History:   Diagnosis Date  . Arthritis   . Bronchitis 2019  . Complication of anesthesia   . Family history of adverse reaction to anesthesia   . Simple ovarian cyst      Review of Systems   see HPI.  Objective:   Physical Exam Vitals reviewed.  Constitutional:      Appearance: Normal appearance. She is obese.  HENT:     Head: Normocephalic.     Right Ear: Tympanic membrane, ear canal and external ear normal. There is no impacted cerumen.     Left Ear: Tympanic membrane, ear canal and external ear normal. There is no impacted cerumen.     Ears:     Comments: Right TM dull.     Nose: Congestion present.     Mouth/Throat:     Mouth: Mucous membranes are moist.  Eyes:     Conjunctiva/sclera: Conjunctivae normal.  Neck:     Comments: Tender bilateral anterior cervical adenopathy.  Cardiovascular:     Rate and Rhythm: Normal rate and regular rhythm.     Pulses: Normal pulses.  Pulmonary:     Effort: Pulmonary effort is normal.     Breath sounds: Normal breath sounds.     Comments: Constant throat clearing.  Neurological:     General: No focal deficit present.     Mental Status: She is alert and oriented to person, place, and time.  Psychiatric:        Mood and Affect: Mood normal.           Assessment &  Plan:  ..Rose Long was seen today for ear pain.  Diagnoses and all orders for this visit:  Acute non-recurrent maxillary sinusitis -     amoxicillin-clavulanate (AUGMENTIN) 875-125 MG tablet; Take 1 tablet by mouth 2 (two) times daily. -     fluticasone (FLONASE) 50 MCG/ACT nasal spray; Place 2 sprays into both nostrils daily. -     HYDROcodone-homatropine (HYCODAN) 5-1.5 MG/5ML syrup; Take 5 mLs by mouth every 8 (eight) hours as needed for cough.  Post-viral cough syndrome -     HYDROcodone-homatropine (HYCODAN) 5-1.5 MG/5ML syrup; Take 5 mLs by mouth every 8 (eight) hours as needed for cough.  History of COVID-19  Right ear pain -     fluticasone (FLONASE) 50  MCG/ACT nasal spray; Place 2 sprays into both nostrils daily.  Other orders -     Discontinue: HYDROcodone-homatropine (HYCODAN) 5-1.5 MG/5ML syrup; Take 5 mLs by mouth every 8 (eight) hours as needed for cough.   Suspect post viral cough from covid 19 and secondary bacterial infection with sinusitis. Pt has issues with sinuses. Start augmentin, flonase and given cough syrup. Follow up if not improving. Reassured no bacterial ear infection. Continue to rest and hydrate. If not improving or worsening call office. Lungs sound good. Discussed some post covid 19 symptoms that can last after initial infection.   ..PDMP not reviewed this encounter.  No concerns with cough syrup.

## 2020-06-16 NOTE — Patient Instructions (Signed)
Eustachian Tube Dysfunction ° °Eustachian tube dysfunction refers to a condition in which a blockage develops in the narrow passage that connects the middle ear to the back of the nose (eustachian tube). The eustachian tube regulates air pressure in the middle ear by letting air move between the ear and nose. It also helps to drain fluid from the middle ear space. °Eustachian tube dysfunction can affect one or both ears. When the eustachian tube does not function properly, air pressure, fluid, or both can build up in the middle ear. °What are the causes? °This condition occurs when the eustachian tube becomes blocked or cannot open normally. Common causes of this condition include: °· Ear infections. °· Colds and other infections that affect the nose, mouth, and throat (upper respiratory tract). °· Allergies. °· Irritation from cigarette smoke. °· Irritation from stomach acid coming up into the esophagus (gastroesophageal reflux). The esophagus is the tube that carries food from the mouth to the stomach. °· Sudden changes in air pressure, such as from descending in an airplane or scuba diving. °· Abnormal growths in the nose or throat, such as: °? Growths that line the nose (nasal polyps). °? Abnormal growth of cells (tumors). °? Enlarged tissue at the back of the throat (adenoids). °What increases the risk? °You are more likely to develop this condition if: °· You smoke. °· You are overweight. °· You are a child who has: °? Certain birth defects of the mouth, such as cleft palate. °? Large tonsils or adenoids. °What are the signs or symptoms? °Common symptoms of this condition include: °· A feeling of fullness in the ear. °· Ear pain. °· Clicking or popping noises in the ear. °· Ringing in the ear. °· Hearing loss. °· Loss of balance. °· Dizziness. °Symptoms may get worse when the air pressure around you changes, such as when you travel to an area of high elevation, fly on an airplane, or go scuba diving. °How is  this diagnosed? °This condition may be diagnosed based on: °· Your symptoms. °· A physical exam of your ears, nose, and throat. °· Tests, such as those that measure: °? The movement of your eardrum (tympanogram). °? Your hearing (audiometry). °How is this treated? °Treatment depends on the cause and severity of your condition. °· In mild cases, you may relieve your symptoms by moving air into your ears. This is called "popping the ears." °· In more severe cases, or if you have symptoms of fluid in your ears, treatment may include: °? Medicines to relieve congestion (decongestants). °? Medicines that treat allergies (antihistamines). °? Nasal sprays or ear drops that contain medicines that reduce swelling (steroids). °? A procedure to drain the fluid in your eardrum (myringotomy). In this procedure, a small tube is placed in the eardrum to: °§ Drain the fluid. °§ Restore the air in the middle ear space. °? A procedure to insert a balloon device through the nose to inflate the opening of the eustachian tube (balloon dilation). °Follow these instructions at home: °Lifestyle °· Do not do any of the following until your health care provider approves: °? Travel to high altitudes. °? Fly in airplanes. °? Work in a pressurized cabin or room. °? Scuba dive. °· Do not use any products that contain nicotine or tobacco, such as cigarettes and e-cigarettes. If you need help quitting, ask your health care provider. °· Keep your ears dry. Wear fitted earplugs during showering and bathing. Dry your ears completely after. °General instructions °· Take over-the-counter   and prescription medicines only as told by your health care provider. °· Use techniques to help pop your ears as recommended by your health care provider. These may include: °? Chewing gum. °? Yawning. °? Frequent, forceful swallowing. °? Closing your mouth, holding your nose closed, and gently blowing as if you are trying to blow air out of your nose. °· Keep all  follow-up visits as told by your health care provider. This is important. °Contact a health care provider if: °· Your symptoms do not go away after treatment. °· Your symptoms come back after treatment. °· You are unable to pop your ears. °· You have: °? A fever. °? Pain in your ear. °? Pain in your head or neck. °? Fluid draining from your ear. °· Your hearing suddenly changes. °· You become very dizzy. °· You lose your balance. °Summary °· Eustachian tube dysfunction refers to a condition in which a blockage develops in the eustachian tube. °· It can be caused by ear infections, allergies, inhaled irritants, or abnormal growths in the nose or throat. °· Symptoms include ear pain, hearing loss, or ringing in the ears. °· Mild cases are treated with maneuvers to unblock the ears, such as yawning or ear popping. °· Severe cases are treated with medicines. Surgery may also be done (rare). °This information is not intended to replace advice given to you by your health care provider. Make sure you discuss any questions you have with your health care provider. °Document Revised: 01/24/2018 Document Reviewed: 01/24/2018 °Elsevier Patient Education © 2020 [employer] ° °

## 2020-06-26 ENCOUNTER — Encounter: Payer: Self-pay | Admitting: Family Medicine

## 2020-06-26 ENCOUNTER — Telehealth (INDEPENDENT_AMBULATORY_CARE_PROVIDER_SITE_OTHER): Payer: 59 | Admitting: Family Medicine

## 2020-06-26 VITALS — Ht 64.0 in | Wt 175.0 lb

## 2020-06-26 DIAGNOSIS — R5383 Other fatigue: Secondary | ICD-10-CM

## 2020-06-26 DIAGNOSIS — R059 Cough, unspecified: Secondary | ICD-10-CM

## 2020-06-26 DIAGNOSIS — R05 Cough: Secondary | ICD-10-CM | POA: Diagnosis not present

## 2020-06-26 DIAGNOSIS — R61 Generalized hyperhidrosis: Secondary | ICD-10-CM

## 2020-06-26 NOTE — Progress Notes (Signed)
Called pt and lvm advising her that Dr. Madilyn Fireman is running late and that she can speak with her at either 2pm or at the end of the day.

## 2020-06-26 NOTE — Progress Notes (Signed)
Virtual Visit via Video Note  I connected with Rose Long on 06/26/20 at 11:30 AM EDT by a video enabled telemedicine application and verified that I am speaking with the correct person using two identifiers.   I discussed the limitations of evaluation and management by telemedicine and the availability of in person appointments. The patient expressed understanding and agreed to proceed.  Patient location: at home Provider location: in office  Subjective:    CC: Cough  HPI: F/U persistent cough and extreme fatigue.  Patiently recent travel to [place] for a missions trip and unfortunately on the way back contracted COVID-19.  She presented to the urgent care on August 17 after 2 days of body aches chills cough and headache.  She did receive the monoclonal antibody infusion on August 19.  Since then she has still continued to not feel well.  Complains of extreme fatigue, sleeping in between patients.  She is sleeping well at night.  Still has on ongonig cough, dry.  She had an office visit on August 30 for sinus symptoms, cough and ear pain and was treated with Augmenting about a week ago.  Still having night sweats.  Some post nasal drip from sinuses.  No SOB.  Still low appetite and has had some weight loss.  She has lost 10 lbs in the last 2 weeks. No swelling in the feet.   4 weeks of contipation.  She is been battling the constipation since she came back over the last 4 weeks as well.  She recently started some over-the-counter medications including what sounds like MiraLAX and some Dulcolax.  She feels like she is finally making some headway in getting her bowels to move.    Past medical history, Surgical history, Family history not pertinant except as noted below, Social history, Allergies, and medications have been entered into the medical record, reviewed, and corrections made.   Review of Systems: No fevers, chills, night sweats, weight loss, chest pain, or shortness of  breath.   Objective:    General: Speaking clearly in complete sentences without any shortness of breath.  Alert and oriented x3.  Normal judgment. No apparent acute distress.    Impression and Recommendations:    No problem-specific Assessment & Plan notes found for this encounter.  Fatigue with cough and night sweats-consider post viral syndrome after having had Covid.  She should not have persistent infection at this point as its been about 3-1/2 weeks.  Evaluate for pneumonia with chest x-ray or other complicating infections we will check a CBC with differential to see if her white count is elevated.  Also evaluate for tuberculosis since she did travel outside of the country and has been having night sweats and weight loss.  In addition consider testing for CMV and EBV.  If everything comes back normal consider testing for pertussis as well.    Time spent in encounter 22 minutes  I discussed the assessment and treatment plan with the patient. The patient was provided an opportunity to ask questions and all were answered. The patient agreed with the plan and demonstrated an understanding of the instructions.   The patient was advised to call back or seek an in-person evaluation if the symptoms worsen or if the condition fails to improve as anticipated.   Beatrice Lecher, MD

## 2020-06-27 ENCOUNTER — Ambulatory Visit (INDEPENDENT_AMBULATORY_CARE_PROVIDER_SITE_OTHER): Payer: 59

## 2020-06-27 ENCOUNTER — Other Ambulatory Visit: Payer: Self-pay

## 2020-06-27 DIAGNOSIS — R059 Cough, unspecified: Secondary | ICD-10-CM

## 2020-06-27 DIAGNOSIS — R5383 Other fatigue: Secondary | ICD-10-CM | POA: Diagnosis not present

## 2020-06-27 DIAGNOSIS — U071 COVID-19: Secondary | ICD-10-CM | POA: Diagnosis not present

## 2020-06-27 DIAGNOSIS — R05 Cough: Secondary | ICD-10-CM

## 2020-07-01 LAB — TSH: TSH: 1.62 mIU/L (ref 0.40–4.50)

## 2020-07-01 LAB — CBC WITH DIFFERENTIAL/PLATELET
Absolute Monocytes: 389 cells/uL (ref 200–950)
Basophils Absolute: 33 cells/uL (ref 0–200)
Basophils Relative: 0.5 %
Eosinophils Absolute: 40 cells/uL (ref 15–500)
Eosinophils Relative: 0.6 %
HCT: 41.1 % (ref 35.0–45.0)
Hemoglobin: 13.2 g/dL (ref 11.7–15.5)
Lymphs Abs: 2171 cells/uL (ref 850–3900)
MCH: 26.8 pg — ABNORMAL LOW (ref 27.0–33.0)
MCHC: 32.1 g/dL (ref 32.0–36.0)
MCV: 83.5 fL (ref 80.0–100.0)
MPV: 12.1 fL (ref 7.5–12.5)
Monocytes Relative: 5.9 %
Neutro Abs: 3967 cells/uL (ref 1500–7800)
Neutrophils Relative %: 60.1 %
Platelets: 355 10*3/uL (ref 140–400)
RBC: 4.92 10*6/uL (ref 3.80–5.10)
RDW: 13.8 % (ref 11.0–15.0)
Total Lymphocyte: 32.9 %
WBC: 6.6 10*3/uL (ref 3.8–10.8)

## 2020-07-01 LAB — EPSTEIN-BARR VIRUS VCA ANTIBODY PANEL
EBV NA IgG: >600 U/mL — ABNORMAL HIGH
EBV VCA IgG: 183 U/mL — ABNORMAL HIGH
EBV VCA IgM: <36 U/mL

## 2020-07-01 LAB — COMPLETE METABOLIC PANEL WITH GFR
AG Ratio: 1.2 (calc) (ref 1.0–2.5)
ALT: 6 U/L (ref 6–29)
AST: 12 U/L (ref 10–35)
Albumin: 4 g/dL (ref 3.6–5.1)
Alkaline phosphatase (APISO): 68 U/L (ref 37–153)
BUN: 11 mg/dL (ref 7–25)
CO2: 25 mmol/L (ref 20–32)
Calcium: 9.9 mg/dL (ref 8.6–10.4)
Chloride: 105 mmol/L (ref 98–110)
Creat: 0.77 mg/dL (ref 0.50–1.05)
GFR, Est African American: 99 mL/min/{1.73_m2} (ref >=60)
GFR, Est Non African American: 85 mL/min/{1.73_m2} (ref >=60)
Globulin: 3.4 g/dL (calc) (ref 1.9–3.7)
Glucose, Bld: 89 mg/dL (ref 65–139)
Potassium: 4.1 mmol/L (ref 3.5–5.3)
Sodium: 138 mmol/L (ref 135–146)
Total Bilirubin: 0.3 mg/dL (ref 0.2–1.2)
Total Protein: 7.4 g/dL (ref 6.1–8.1)

## 2020-07-01 LAB — QUANTIFERON-TB GOLD PLUS
Mitogen-NIL: 9.8 IU/mL
NIL: 0.02 IU/mL
QuantiFERON-TB Gold Plus: NEGATIVE
TB1-NIL: 0.01 IU/mL
TB2-NIL: 0.15 IU/mL

## 2020-07-01 LAB — TEST AUTHORIZATION

## 2020-07-01 LAB — CMV ABS, IGG+IGM (CYTOMEGALOVIRUS)
CMV IgM: <30 AU/mL
Cytomegalovirus Ab-IgG: <0.6 U/mL

## 2020-07-01 LAB — BORDETELLA PERTUSSIS TOXIN (PT) AB ( IGG, IGA) , MAID
Bordetella pertussis toxin (PT) Ab (IgA): 2 IU/mL
Bordetella pertussis toxin (PT) Ab (IgG): 47 IU/mL — ABNORMAL HIGH

## 2020-07-01 LAB — VITAMIN B12: Vitamin B-12: 292 pg/mL (ref 200–1100)

## 2020-07-01 LAB — IRON: Iron: 72 ug/dL (ref 45–160)

## 2020-07-01 LAB — FERRITIN: Ferritin: 32 ng/mL (ref 16–232)

## 2020-07-08 ENCOUNTER — Encounter: Payer: Self-pay | Admitting: Family Medicine

## 2020-07-08 NOTE — Telephone Encounter (Signed)
Please schedule a nurse visit scheduled for  B12 injection, weekly for 1 month, and then monthly.  B12 1000 mcg IM weekly x4 weeks, then monthly x6 months

## 2020-07-10 NOTE — Telephone Encounter (Signed)
Left voicemail for patient to call us to make this appointment.

## 2020-07-11 ENCOUNTER — Ambulatory Visit (INDEPENDENT_AMBULATORY_CARE_PROVIDER_SITE_OTHER): Payer: 59 | Admitting: Family Medicine

## 2020-07-11 VITALS — BP 127/70 | HR 71

## 2020-07-11 DIAGNOSIS — D510 Vitamin B12 deficiency anemia due to intrinsic factor deficiency: Secondary | ICD-10-CM

## 2020-07-11 MED ORDER — CYANOCOBALAMIN 1000 MCG/ML IJ SOLN
1000.0000 ug | Freq: Once | INTRAMUSCULAR | Status: AC
  Administered 2020-07-11: 1000 ug via INTRAMUSCULAR

## 2020-07-11 NOTE — Progress Notes (Signed)
Patient is here for a vitamin B 12 injection. Denies gastrointestinal problems or dizziness. Patient tolerated patient well without complications. Patient advised to schedule next injection in 30 days.

## 2020-07-12 ENCOUNTER — Encounter: Payer: Self-pay | Admitting: Family Medicine

## 2020-07-12 NOTE — Progress Notes (Signed)
Agree with documentation as above.   Kathi Dohn, MD  

## 2020-07-26 ENCOUNTER — Encounter: Payer: Self-pay | Admitting: Family Medicine

## 2020-08-01 ENCOUNTER — Other Ambulatory Visit: Payer: Self-pay

## 2020-08-01 ENCOUNTER — Ambulatory Visit (INDEPENDENT_AMBULATORY_CARE_PROVIDER_SITE_OTHER): Payer: 59 | Admitting: Family Medicine

## 2020-08-01 VITALS — BP 135/72 | HR 69 | Temp 97.9°F | Wt 207.1 lb

## 2020-08-01 DIAGNOSIS — Z23 Encounter for immunization: Secondary | ICD-10-CM

## 2020-08-01 DIAGNOSIS — D510 Vitamin B12 deficiency anemia due to intrinsic factor deficiency: Secondary | ICD-10-CM

## 2020-08-01 MED ORDER — CYANOCOBALAMIN 1000 MCG/ML IJ SOLN
1000.0000 ug | Freq: Once | INTRAMUSCULAR | Status: AC
  Administered 2020-08-01: 1000 ug via INTRAMUSCULAR

## 2020-08-01 NOTE — Progress Notes (Signed)
Patient presented for B12 injection. Patient schedule: 1053mcg B12 every 7 days x 4 weeks. Then injection due monthly x 6 months.  This is week 2 of 4. Injected 102mcg B12 = Right Deltoid  Denies gastrointestinal problems or dizziness.   Patient tolerated injection well without complications. Patient advised to schedule next injection in 7 days.

## 2020-08-01 NOTE — Progress Notes (Signed)
Agree with documentation as above.   Anju Sereno, MD  

## 2020-08-07 ENCOUNTER — Ambulatory Visit (INDEPENDENT_AMBULATORY_CARE_PROVIDER_SITE_OTHER): Payer: 59 | Admitting: Family Medicine

## 2020-08-07 VITALS — BP 125/82 | HR 71 | Wt 207.0 lb

## 2020-08-07 DIAGNOSIS — D510 Vitamin B12 deficiency anemia due to intrinsic factor deficiency: Secondary | ICD-10-CM | POA: Diagnosis not present

## 2020-08-07 MED ORDER — CYANOCOBALAMIN 1000 MCG/ML IJ SOLN
1000.0000 ug | Freq: Once | INTRAMUSCULAR | Status: AC
Start: 2020-08-07 — End: 2020-08-07
  Administered 2020-08-07: 1000 ug via INTRAMUSCULAR

## 2020-08-07 NOTE — Progress Notes (Signed)
Agree with documentation as above.   Aveena Bari, MD  

## 2020-08-07 NOTE — Progress Notes (Signed)
   Subjective:    Patient ID: Rose Long, female    DOB: [birth year], 59 y.o.   MRN: [id number]  HPI  Patient here for a B12 injection.  Denies gastrointestinal problems or dizziness.    Review of Systems     Objective:   Physical Exam        Assessment & Plan:  Patient tolerated injection well without complications.  Patient advised to schedule next injection in 1 week.

## 2020-08-15 ENCOUNTER — Ambulatory Visit (INDEPENDENT_AMBULATORY_CARE_PROVIDER_SITE_OTHER): Payer: 59 | Admitting: Family Medicine

## 2020-08-15 ENCOUNTER — Other Ambulatory Visit: Payer: Self-pay

## 2020-08-15 VITALS — BP 142/88 | HR 61

## 2020-08-15 DIAGNOSIS — D510 Vitamin B12 deficiency anemia due to intrinsic factor deficiency: Secondary | ICD-10-CM

## 2020-08-15 MED ORDER — CYANOCOBALAMIN 1000 MCG/ML IJ SOLN
1000.0000 ug | Freq: Once | INTRAMUSCULAR | Status: AC
  Administered 2020-08-15: 1000 ug via INTRAMUSCULAR

## 2020-08-15 NOTE — Progress Notes (Signed)
Agree with documentation as above.   Partick Musselman, MD  

## 2020-08-15 NOTE — Progress Notes (Signed)
° °  Subjective:    Patient ID: Rose Long, female    DOB: 22-Jul-1961, 59 y.o.   MRN: [id number]  HPI Patient is here for a Vitamin B12 injection. Denied any gastrointestional problems or dizziness.    Review of Systems     Objective:   Physical Exam        Assessment & Plan:  Patient tolerated injection well without complication. Patient advised to schedule next injection in 30 days

## 2020-08-18 ENCOUNTER — Other Ambulatory Visit: Payer: Self-pay | Admitting: Family Medicine

## 2020-08-18 ENCOUNTER — Encounter: Payer: Self-pay | Admitting: Family Medicine

## 2020-08-18 ENCOUNTER — Ambulatory Visit: Payer: 59 | Admitting: Family Medicine

## 2020-08-18 VITALS — BP 143/86 | HR 58

## 2020-08-18 DIAGNOSIS — Z79899 Other long term (current) drug therapy: Secondary | ICD-10-CM

## 2020-08-18 DIAGNOSIS — R5383 Other fatigue: Secondary | ICD-10-CM

## 2020-08-18 DIAGNOSIS — R41840 Attention and concentration deficit: Secondary | ICD-10-CM | POA: Diagnosis not present

## 2020-08-18 DIAGNOSIS — D510 Vitamin B12 deficiency anemia due to intrinsic factor deficiency: Secondary | ICD-10-CM | POA: Diagnosis not present

## 2020-08-18 DIAGNOSIS — F411 Generalized anxiety disorder: Secondary | ICD-10-CM

## 2020-08-18 DIAGNOSIS — F909 Attention-deficit hyperactivity disorder, unspecified type: Secondary | ICD-10-CM | POA: Insufficient documentation

## 2020-08-18 MED ORDER — SERTRALINE HCL 50 MG PO TABS: ORAL_TABLET | ORAL | 0 refills | Status: DC

## 2020-08-18 MED ORDER — AMPHETAMINE-DEXTROAMPHETAMINE 10 MG PO TABS: 10.0000 mg | ORAL_TABLET | Freq: Two times a day (BID) | ORAL | 0 refills | Status: DC

## 2020-08-18 MED FILL — SERTRALINE HCL 50 MG TABLET: 50 | 90 days supply | Qty: 90 | Fill #0

## 2020-08-18 MED FILL — AMPHETAMINE SALTS 10 MG: 10 | 30 days supply | Qty: 60 | Fill #0

## 2020-08-18 NOTE — Assessment & Plan Note (Signed)
And to recheck B12 level before next injection in about 3 to 4 weeks.  So we can get a baseline and see how well she is doing hopefully we can just continue with monthly injections at that point.

## 2020-08-18 NOTE — Assessment & Plan Note (Signed)
Discussed options.  I am interested to see if starting the ADD medication does alleviate some of her symptoms.  She would like to at least get back on a low-dose of sertraline and then we can always adjust it depending on how well she is responding to the Adderall.

## 2020-08-18 NOTE — Assessment & Plan Note (Signed)
She is currently undergoing more formal evaluation and will get that report to me as soon as she can.  If we need to do more detailed testing at any point we can certainly do so if she is not responding to the medication well.  Did discuss starting with a stimulant warned about potential side effects including chest pain, palpitations insomnia and decreased appetite.  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

## 2020-08-18 NOTE — Progress Notes (Signed)
Established Patient Office Visit  Subjective:  Patient ID: Rose Long, female    DOB: [birth year]  Age: 59 y.o. MRN: [id number]  CC:  Chief Complaint  Patient presents with  . Follow-up    HPI Rose Long presents for   Follow-up extreme fatigue and persistent cough since having had Covid after a trip to [place].  We did test her for tuberculosis and it was negative as well as pertussis.  Negative for CMV and EBV.  Her iron stores actually looked okay but her B12 was on the low end of normal.  We have started her on B12 shots.  Actually feels like the Covid symptoms have been gradually getting better.  She is also been doing the B12 shots once a week for 4 weeks and says that really has helped with her energy level she does feel better.  But she has some new issues she would like to discuss today.  While she was sick she really was not taking her medication consistently.  So she just came off of it completely she was on 10 mg of sertraline.  She notes overall she needs to get back on it but says that after really thinking and analyzing her symptoms she really wonders if she has ADD or ADHD.  She reports that she has had difficulty being on time, losing things such as her wallet, keys etc.  Constantly running behind and not being organized.  And this then leads into some irritability.  She is noticed some mild impulsiveness in regards to making decisions.  Trouble concentrating particularly for reading and studying.  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  She has never been formally evaluated or treated.  She is currently undergoing an assessment with a [facility] licensed provider for ADD/ADHD.  She is interested in a trial of medication.  She would also like to restart her sertraline but at a lower dose.  Past Medical History:  Diagnosis Date  . Anemia   . Arthritis   . Bronchitis 2019  . Complication of anesthesia    PONV  . Family  history of adverse reaction to anesthesia    pt's mother stopped breathing possibly due to sleep apnea  . Migraine   . Obesity   . Simple ovarian cyst    Right    Past Surgical History:  Procedure Laterality Date  . ABDOMINOPLASTY/PANNICULECTOMY    . BREAST SURGERY  1998   reduction  . BREAST SURGERY  10/2013   Lift, tummy tuck  . NASAL SINUS SURGERY  1999  . REDUCTION MAMMAPLASTY  1998  . REDUCTION MAMMAPLASTY  2017   Breast lift  . 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 under arm skin removed    . uterine ablation  2012    Family History  Problem Relation Age of Onset  . Hypertension Mother   . Bladder Cancer Mother   . Hyperlipidemia Mother   . Migraines Mother   . Obesity Mother   . Rheum arthritis Mother   . Hypertension Father   . CVA Father   . Heart failure Father   . Arthritis Father   . Hyperlipidemia Father   . Kidney failure Father   . Heart disease Maternal Grandfather   . Lung cancer Paternal Grandmother  lung  . Alcohol abuse Paternal Uncle   . Diabetes Cousin   . Arthritis Sister   . Hypertension Brother   . Obesity Brother   . Rheum arthritis Brother     Social History   Socioeconomic History  . Marital status: Married    Spouse name: Not on file  . Number of children: 0  . Years of education: Masters  . Highest education level: Not on file  Occupational History  . Occupation: Therapist    Comment: [employer]  Tobacco Use  . Smoking status: Never Smoker  . Smokeless tobacco: Never Used  Vaping Use  . Vaping Use: Never used  Substance and Sexual Activity  . Alcohol use: Yes    Alcohol/week: 1.0 standard drink    Types: 1 Standard drinks or equivalent per week    Comment: socially  . Drug use: No  . Sexual activity: Yes    Birth control/protection: None  Other  Topics Concern  . Not on file  Social History Narrative   Parents moved into her home on June 2013. Lives with her spouse.  2 caffeine drinks per day.  Takes a 30 minute walk 7 days a week. Left-handed   Social Determinants of Health   Financial Resource Strain:   . Difficulty of Paying Living Expenses: Not on file  Food Insecurity:   . Worried About Charity fundraiser in the Last Year: Not on file  . Ran Out of Food in the Last Year: Not on file  Transportation Needs:   . Lack of Transportation (Medical): Not on file  . Lack of Transportation (Non-Medical): Not on file  Physical Activity:   . Days of Exercise per Week: Not on file  . Minutes of Exercise per Session: Not on file  Stress:   . Feeling of Stress : Not on file  Social Connections:   . Frequency of Communication with Friends and Family: Not on file  . Frequency of Social Gatherings with Friends and Family: Not on file  . Attends Religious Services: Not on file  . Active Member of Clubs or Organizations: Not on file  . Attends Archivist Meetings: Not on file  . Marital Status: Not on file  Intimate Partner Violence:   . Fear of Current or Ex-Partner: Not on file  . Emotionally Abused: Not on file  . Physically Abused: Not on file  . Sexually Abused: Not on file    Outpatient Medications Prior to Visit  Medication Sig Dispense Refill  . acetaminophen (TYLENOL) 650 MG CR tablet Take 1 tablet (650 mg total) by mouth every 8 (eight) hours as needed for pain. 90 tablet 3  . calcium citrate-vitamin D 200-200 MG-UNIT TABS Take 1 tablet by mouth daily.      . Cholecalciferol 1.25 MG (50000 UT) capsule Take 1 capsule (50,000 Units total) by mouth once a week. 12 capsule 1  . Diclofenac Sodium 2 % SOLN Place 2 sprays onto the skin 2 (two) times daily. 1 Bottle 11  . ferrous sulfate 325 (65 FE) MG tablet Take 325 mg by mouth daily with breakfast.    . MAGNESIUM CITRATE PO Take 1 tablet by mouth 2 (two) times  daily.     . Multiple Vitamin (MULTIVITAMIN) tablet Take 1 tablet by mouth daily.      . sertraline (ZOLOFT) 100 MG tablet TAKE TWO TABLETS BY MOUTH DAILY 180 tablet 1  . metaxalone (SKELAXIN) 800 MG tablet     .  ondansetron (ZOFRAN) 8 MG tablet Take 1 tablet (8 mg total) by mouth every 8 (eight) hours as needed for nausea or vomiting. 20 tablet 0  . scopolamine (TRANSDERM-SCOP, 1.5 MG,) 1 MG/3DAYS Place 1 patch (1.5 mg total) onto the skin every 3 (three) days. 4 patch 0  . zonisamide (ZONEGRAN) 100 MG capsule      No facility-administered medications prior to visit.    Allergies  Allergen Reactions  . Sulfa Antibiotics Anaphylaxis  . Sulfamethoxazole-Trimethoprim Anaphylaxis, Swelling and Rash    Redness and swelling  . Bee Venom   . Trazodone And Nefazodone     headache    ROS Review of Systems    Objective:    Physical Exam Constitutional:      Appearance: She is well-developed.  HENT:     Head: Normocephalic and atraumatic.  Cardiovascular:     Rate and Rhythm: Normal rate and regular rhythm.     Heart sounds: Normal heart sounds.  Pulmonary:     Effort: Pulmonary effort is normal.     Breath sounds: Normal breath sounds.  Skin:    General: Skin is warm and dry.  Neurological:     Mental Status: She is alert and oriented to person, place, and time.  Psychiatric:        Behavior: Behavior normal.     BP (!) 143/86   Pulse (!) 58   SpO2 100%  Wt Readings from Last 3 Encounters:  08/07/20 207 lb (93.9 kg)  08/01/20 207 lb 1.6 oz (93.9 kg)  06/26/20 175 lb (79.4 kg)     There are no preventive care reminders to display for this patient.  There are no preventive care reminders to display for this patient.  Lab Results  Component Value Date   TSH 1.62 06/26/2020   Lab Results  Component Value Date   WBC 6.6 06/26/2020   HGB 13.2 06/26/2020   HCT 41.1 06/26/2020   MCV 83.5 06/26/2020   PLT 355 06/26/2020   Lab Results  Component Value Date   NA  138 06/26/2020   K 4.1 06/26/2020   CO2 25 06/26/2020   GLUCOSE 89 06/26/2020   BUN 11 06/26/2020   CREATININE 0.77 06/26/2020   BILITOT 0.3 06/26/2020   ALKPHOS 70 06/02/2015   AST 12 06/26/2020   ALT 6 06/26/2020   PROT 7.4 06/26/2020   ALBUMIN 3.6 06/02/2015   CALCIUM 9.9 06/26/2020   ANIONGAP 9 09/19/2015   Lab Results  Component Value Date   CHOL 245 (H) 07/24/2019   Lab Results  Component Value Date   HDL 59 07/24/2019   Lab Results  Component Value Date   LDLCALC 164 (H) 07/24/2019   Lab Results  Component Value Date   TRIG 108 07/24/2019   Lab Results  Component Value Date   CHOLHDL 4.2 07/24/2019   No results found for: HGBA1C    Assessment & Plan:   Problem List Items Addressed This Visit      Other   Pernicious anemia - Primary    And to recheck B12 level before next injection in about 3 to 4 weeks.  So we can get a baseline and see how well she is doing hopefully we can just continue with monthly injections at that point.      Relevant Orders   B12   Inattention    She is currently undergoing more formal evaluation and will get that report to me as soon as she can.  If we need to do more detailed testing at any point we can certainly do so if she is not responding to the medication well.  Did discuss starting with a stimulant warned about potential side effects including chest pain, palpitations insomnia and decreased appetite.  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      Relevant Medications   amphetamine-dextroamphetamine (ADDERALL) 10 MG tablet   GAD (generalized anxiety disorder)    Discussed options.  I am interested to see if starting the ADD medication does alleviate some of her symptoms.  She would like to at least get back on a low-dose of sertraline and then we can always adjust it depending on how well she is responding to the Adderall.       Relevant Medications   sertraline (ZOLOFT) 50 MG tablet    Other Visit Diagnoses    Fatigue, unspecified type       Relevant Orders   B12   New medication added       Relevant Orders   EKG 12-Lead     EKG today shows rate of 65 bpm, normal sinus rhythm with no acute ST-T wave changes.  And no significant change from prior EKG.  Meds ordered this encounter  Medications  . amphetamine-dextroamphetamine (ADDERALL) 10 MG tablet    Sig: Take 1 tablet (10 mg total) by mouth 2 (two) times daily with a meal.    Dispense:  60 tablet    Refill:  0  . sertraline (ZOLOFT) 50 MG tablet    Sig: 1/2 tab po QD x 8 days then 1 tab po QD    Dispense:  90 tablet    Refill:  0    Follow-up: No follow-ups on file.    Beatrice Lecher, MD

## 2020-08-22 ENCOUNTER — Ambulatory Visit: Payer: 59

## 2020-09-04 ENCOUNTER — Encounter: Payer: Self-pay | Admitting: Family Medicine

## 2020-09-04 DIAGNOSIS — R41840 Attention and concentration deficit: Secondary | ICD-10-CM

## 2020-09-04 DIAGNOSIS — R5383 Other fatigue: Secondary | ICD-10-CM | POA: Diagnosis not present

## 2020-09-04 DIAGNOSIS — D510 Vitamin B12 deficiency anemia due to intrinsic factor deficiency: Secondary | ICD-10-CM | POA: Diagnosis not present

## 2020-09-05 LAB — VITAMIN B12: Vitamin B-12: 420 pg/mL (ref 200–1100)

## 2020-09-08 ENCOUNTER — Ambulatory Visit: Payer: 59 | Admitting: Family Medicine

## 2020-09-08 DIAGNOSIS — D510 Vitamin B12 deficiency anemia due to intrinsic factor deficiency: Secondary | ICD-10-CM

## 2020-09-08 NOTE — Assessment & Plan Note (Deleted)
Plan to start spacing B12 injections to monthly.  And then repeat level in 4 to 6 months.

## 2020-09-08 NOTE — Progress Notes (Deleted)
Established Patient Office Visit  Subjective:  Patient ID: Rose Long, female    DOB: 1961-05-20  Age: 59 y.o. MRN: [id number]  CC: No chief complaint on file.   HPI Rose Long presents for in attention. F/U ADD testing that was done via onn online evaluation service. She sent the records via Mychart to review.  They did feel like her symptoms were most consistent with ADHD.  Also encourage that she continue to treat her depression and anxiety and work on sleep quality all of which can affect inattention.  Follow-up pernicious anemia.  Due for B12 injection today.  She actually just recently did updated blood work her B12 levels are coming up very nicely.  Past Medical History:  Diagnosis Date  . Anemia   . Arthritis   . Bronchitis 2019  . Complication of anesthesia    PONV  . Family history of adverse reaction to anesthesia    pt's mother stopped breathing possibly due to sleep apnea  . Migraine   . Obesity   . Simple ovarian cyst    Right    Past Surgical History:  Procedure Laterality Date  . ABDOMINOPLASTY/PANNICULECTOMY    . BREAST SURGERY  1998   reduction  . BREAST SURGERY  10/2013   Lift, tummy tuck  . NASAL SINUS SURGERY  1999  . REDUCTION MAMMAPLASTY  1998  . REDUCTION MAMMAPLASTY  2017   Breast lift  . 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 TOTAL KNEE ARTHROPLASTY Left 09/30/2015   Procedure: TOTAL KNEE ARTHROPLASTY;  Surgeon: Melrose Nakayama, MD;  Location: [facility];  Service: Orthopedics;  Laterality: Left;  . under arm skin removed    . uterine ablation  2012    Family History  Problem Relation Age of Onset  . Hypertension Mother   . Bladder Cancer Mother   . Hyperlipidemia Mother   . Migraines Mother   . Obesity Mother   . Rheum arthritis Mother   . Hypertension Father   . CVA Father   . Heart failure Father   . Arthritis Father   . Hyperlipidemia Father   . Kidney failure Father   . Heart disease Maternal Grandfather   . Lung cancer  Paternal Grandmother        lung  . Alcohol abuse Paternal Uncle   . Diabetes Cousin   . Arthritis Sister   . Hypertension Brother   . Obesity Brother   . Rheum arthritis Brother     Social History   Socioeconomic History  . Marital status: Married    Spouse name: Not on file  . Number of children: 0  . Years of education: Masters  . Highest education level: Not on file  Occupational History  . Occupation: Therapist    Comment: [employer]  Tobacco Use  . Smoking status: Never Smoker  . Smokeless tobacco: Never Used  Vaping Use  . Vaping Use: Never used  Substance and Sexual Activity  . Alcohol use: Yes    Alcohol/week: 1.0 standard drink    Types: 1 Standard drinks or equivalent per week    Comment: socially  . Drug use: No  . Sexual activity: Yes    Birth control/protection: None  Other Topics Concern  . Not on file  Social History Narrative   Parents moved into her home on June 2013. Lives with her spouse.  2 caffeine drinks per day.  Takes a 30 minute walk 7 days a week. Left-handed   Social  Determinants of Health   Financial Resource Strain:   . Difficulty of Paying Living Expenses: Not on file  Food Insecurity:   . Worried About Charity fundraiser in the Last Year: Not on file  . Ran Out of Food in the Last Year: Not on file  Transportation Needs:   . Lack of Transportation (Medical): Not on file  . Lack of Transportation (Non-Medical): Not on file  Physical Activity:   . Days of Exercise per Week: Not on file  . Minutes of Exercise per Session: Not on file  Stress:   . Feeling of Stress : Not on file  Social Connections:   . Frequency of Communication with Friends and Family: Not on file  . Frequency of Social Gatherings with Friends and Family: Not on file  . Attends Religious Services: Not on file  . Active Member of Clubs or Organizations: Not on file  . Attends Archivist Meetings: Not on file  . Marital Status: Not on file  Intimate  Partner Violence:   . Fear of Current or Ex-Partner: Not on file  . Emotionally Abused: Not on file  . Physically Abused: Not on file  . Sexually Abused: Not on file    Outpatient Medications Prior to Visit  Medication Sig Dispense Refill  . acetaminophen (TYLENOL) 650 MG CR tablet Take 1 tablet (650 mg total) by mouth every 8 (eight) hours as needed for pain. 90 tablet 3  . amphetamine-dextroamphetamine (ADDERALL) 10 MG tablet Take 1 tablet (10 mg total) by mouth 2 (two) times daily with a meal. 60 tablet 0  . calcium citrate-vitamin D 200-200 MG-UNIT TABS Take 1 tablet by mouth daily.      . Cholecalciferol 1.25 MG (50000 UT) capsule Take 1 capsule (50,000 Units total) by mouth once a week. 12 capsule 1  . Diclofenac Sodium 2 % SOLN Place 2 sprays onto the skin 2 (two) times daily. 1 Bottle 11  . ferrous sulfate 325 (65 FE) MG tablet Take 325 mg by mouth daily with breakfast.    . MAGNESIUM CITRATE PO Take 1 tablet by mouth 2 (two) times daily.     . Multiple Vitamin (MULTIVITAMIN) tablet Take 1 tablet by mouth daily.      . sertraline (ZOLOFT) 50 MG tablet 1/2 tab po QD x 8 days then 1 tab po QD 90 tablet 0   No facility-administered medications prior to visit.    Allergies  Allergen Reactions  . Sulfa Antibiotics Anaphylaxis  . Sulfamethoxazole-Trimethoprim Anaphylaxis, Swelling and Rash    Redness and swelling  . Bee Venom   . Trazodone And Nefazodone     headache    ROS Review of Systems    Objective:    Physical Exam Constitutional:      Appearance: She is well-developed.  HENT:     Head: Normocephalic and atraumatic.  Cardiovascular:     Rate and Rhythm: Normal rate and regular rhythm.     Heart sounds: Normal heart sounds.  Pulmonary:     Effort: Pulmonary effort is normal.     Breath sounds: Normal breath sounds.  Skin:    General: Skin is warm and dry.  Neurological:     Mental Status: She is alert and oriented to person, place, and time.  Psychiatric:         Behavior: Behavior normal.     There were no vitals taken for this visit. Wt Readings from Last 3 Encounters:  08/07/20 207  lb (93.9 kg)  08/01/20 207 lb 1.6 oz (93.9 kg)  06/26/20 175 lb (79.4 kg)     There are no preventive care reminders to display for this patient.  There are no preventive care reminders to display for this patient.  Lab Results  Component Value Date   TSH 1.62 06/26/2020   Lab Results  Component Value Date   WBC 6.6 06/26/2020   HGB 13.2 06/26/2020   HCT 41.1 06/26/2020   MCV 83.5 06/26/2020   PLT 355 06/26/2020   Lab Results  Component Value Date   NA 138 06/26/2020   K 4.1 06/26/2020   CO2 25 06/26/2020   GLUCOSE 89 06/26/2020   BUN 11 06/26/2020   CREATININE 0.77 06/26/2020   BILITOT 0.3 06/26/2020   ALKPHOS 70 06/02/2015   AST 12 06/26/2020   ALT 6 06/26/2020   PROT 7.4 06/26/2020   ALBUMIN 3.6 06/02/2015   CALCIUM 9.9 06/26/2020   ANIONGAP 9 09/19/2015   Lab Results  Component Value Date   CHOL 245 (H) 07/24/2019   Lab Results  Component Value Date   HDL 59 07/24/2019   Lab Results  Component Value Date   LDLCALC 164 (H) 07/24/2019   Lab Results  Component Value Date   TRIG 108 07/24/2019   Lab Results  Component Value Date   CHOLHDL 4.2 07/24/2019   No results found for: HGBA1C    Assessment & Plan:   Problem List Items Addressed This Visit      Other   Pernicious anemia - Primary    Plan to start spacing B12 injections to monthly.  And then repeat level in 4 to 6 months.      ADHD      No orders of the defined types were placed in this encounter.   Follow-up: No follow-ups on file.    Beatrice Lecher, MD

## 2020-09-10 ENCOUNTER — Ambulatory Visit: Payer: 59

## 2020-09-30 ENCOUNTER — Other Ambulatory Visit: Payer: Self-pay | Admitting: Family Medicine

## 2020-09-30 MED ORDER — AMPHETAMINE-DEXTROAMPHETAMINE 10 MG PO TABS: 10.0000 mg | ORAL_TABLET | Freq: Two times a day (BID) | ORAL | 0 refills | Status: DC

## 2020-09-30 MED FILL — AMPHETAMINE SALTS 10 MG: 10 | 30 days supply | Qty: 60 | Fill #0

## 2020-10-02 ENCOUNTER — Ambulatory Visit (INDEPENDENT_AMBULATORY_CARE_PROVIDER_SITE_OTHER): Payer: 59 | Admitting: Family Medicine

## 2020-10-02 ENCOUNTER — Other Ambulatory Visit: Payer: Self-pay

## 2020-10-02 VITALS — BP 147/87 | HR 78

## 2020-10-02 DIAGNOSIS — D510 Vitamin B12 deficiency anemia due to intrinsic factor deficiency: Secondary | ICD-10-CM | POA: Diagnosis not present

## 2020-10-02 MED ORDER — CYANOCOBALAMIN 1000 MCG/ML IJ SOLN
1000.0000 ug | Freq: Once | INTRAMUSCULAR | Status: AC
  Administered 2020-10-02: 15:00:00 1000 ug via INTRAMUSCULAR

## 2020-10-02 NOTE — Progress Notes (Signed)
Medical screening examination/treatment was performed by qualified clinical staff member and as supervising physician I was immediately available for consultation/collaboration. I have reviewed documentation and agree with assessment and plan.  Wendell Fiebig, DO  

## 2020-10-02 NOTE — Progress Notes (Signed)
Pt is here for B12 injection. She denies any GI issues, dizziness.   Pt given injection in LD she tolerated well.  She will RTC in 1 month for next injection.

## 2020-10-08 ENCOUNTER — Ambulatory Visit: Payer: 59

## 2020-10-13 ENCOUNTER — Other Ambulatory Visit: Payer: Self-pay | Admitting: Family Medicine

## 2020-10-13 ENCOUNTER — Encounter: Payer: Self-pay | Admitting: Family Medicine

## 2020-10-13 ENCOUNTER — Telehealth (INDEPENDENT_AMBULATORY_CARE_PROVIDER_SITE_OTHER): Payer: 59 | Admitting: Family Medicine

## 2020-10-13 DIAGNOSIS — R030 Elevated blood-pressure reading, without diagnosis of hypertension: Secondary | ICD-10-CM | POA: Diagnosis not present

## 2020-10-13 DIAGNOSIS — G43919 Migraine, unspecified, intractable, without status migrainosus: Secondary | ICD-10-CM

## 2020-10-13 DIAGNOSIS — Z980 Intestinal bypass and anastomosis status: Secondary | ICD-10-CM

## 2020-10-13 DIAGNOSIS — F902 Attention-deficit hyperactivity disorder, combined type: Secondary | ICD-10-CM

## 2020-10-13 DIAGNOSIS — Z903 Acquired absence of stomach [part of]: Secondary | ICD-10-CM

## 2020-10-13 MED ORDER — AMPHETAMINE-DEXTROAMPHETAMINE 10 MG PO TABS: 10.0000 mg | ORAL_TABLET | Freq: Two times a day (BID) | ORAL | 0 refills | Status: DC

## 2020-10-13 MED ORDER — SUMATRIPTAN SUCCINATE 100 MG PO TABS: 100.0000 mg | ORAL_TABLET | ORAL | 2 refills | Status: DC | PRN

## 2020-10-13 MED FILL — SUMATRIPTAN SUCC 100 MG TAB: 100 | 30 days supply | Qty: 9 | Fill #0

## 2020-10-13 NOTE — Assessment & Plan Note (Signed)
Discussed potential causes of blood pressure elevations.  Encouraged her to consider getting a home monitor to check pressures at home after she has been able to sit and rest for 5 minutes.

## 2020-10-13 NOTE — Assessment & Plan Note (Signed)
Did warn about using NSAIDs infrequently.  Discussed that frequent use can actually increased risk for gastric ulcers.

## 2020-10-13 NOTE — Progress Notes (Signed)
Virtual Visit via Video Note  I connected with Rose Long on 10/13/20 at  2:20 PM EST by a video enabled telemedicine application and verified that I am speaking with the correct person using two identifiers.   I discussed the limitations of evaluation and management by telemedicine and the availability of in person appointments. The patient expressed understanding and agreed to proceed.  Patient location: at home  Provider location: in office  Subjective:    CC:   HPI: ADD - Reports symptoms are well controlled on current regime. Denies any problems with insomnia, chest pain, palpitations, or SOB.  Has been on for 6 weeks.  Doing really well with her regimen.  She feels like it is very effective in helping her get things done, close charts and stay organized.  She is been completing work on time.  She says it really has been great.  She does not have any concerns about the medication at this point in time.  Did want to discuss her blood pressures more recently being elevated she says it started in before she started the Adderall where she was noticing some occasional elevation.  She does take an occasional NSAID for her migraines.  She does drink a small amount of caffeine usually daily.  No recent changes in salt intake.   Past medical history, Surgical history, Family history not pertinant except as noted below, Social history, Allergies, and medications have been entered into the medical record, reviewed, and corrections made.   Review of Systems: No fevers, chills, night sweats, weight loss, chest pain, or shortness of breath.   Objective:    General: Speaking clearly in complete sentences without any shortness of breath.  Alert and oriented x3.  Normal judgment. No apparent acute distress.    Impression and Recommendations:    ADHD Very happy with current regimen.  Refill sent for the next 90 days.  Follow-up in 3 to 4 months.  Status post total gastrectomy and  Roux-en-Y esophagojejunal anastomosis Did warn about using NSAIDs infrequently.  Discussed that frequent use can actually increased risk for gastric ulcers.  Elevated BP without diagnosis of hypertension Discussed potential causes of blood pressure elevations.  Encouraged her to consider getting a home monitor to check pressures at home after she has been able to sit and rest for 5 minutes.  Migraine He would like to retry Imitrex she had been getting Botox injections for quite a long time it did seem to help but she has not done those since she got sick earlier this summer.  But she would like a new prescription for Imitrex so that she is not having to use Excedrin Migraine as frequently.      Time spent in encounter 21 minutes  I discussed the assessment and treatment plan with the patient. The patient was provided an opportunity to ask questions and all were answered. The patient agreed with the plan and demonstrated an understanding of the instructions.   The patient was advised to call back or seek an in-person evaluation if the symptoms worsen or if the condition fails to improve as anticipated.   Nani Gasser, MD

## 2020-10-13 NOTE — Progress Notes (Signed)
Called and LVM advising pt that I was calling to do her prescreening. Asked that she log into my chart.

## 2020-10-13 NOTE — Assessment & Plan Note (Signed)
Very happy with current regimen.  Refill sent for the next 90 days.  Follow-up in 3 to 4 months.

## 2020-10-13 NOTE — Assessment & Plan Note (Signed)
He would like to retry Imitrex she had been getting Botox injections for quite a long time it did seem to help but she has not done those since she got sick earlier this summer.  But she would like a new prescription for Imitrex so that she is not having to use Excedrin Migraine as frequently.

## 2020-11-07 ENCOUNTER — Other Ambulatory Visit: Payer: Self-pay

## 2020-11-07 ENCOUNTER — Ambulatory Visit (INDEPENDENT_AMBULATORY_CARE_PROVIDER_SITE_OTHER): Payer: 59 | Admitting: Family Medicine

## 2020-11-07 VITALS — BP 140/82 | HR 59 | Temp 97.1°F

## 2020-11-07 DIAGNOSIS — D510 Vitamin B12 deficiency anemia due to intrinsic factor deficiency: Secondary | ICD-10-CM

## 2020-11-07 MED ORDER — CYANOCOBALAMIN 1000 MCG/ML IJ SOLN
1000.0000 ug | Freq: Once | INTRAMUSCULAR | Status: AC
  Administered 2020-11-07: 1000 ug via INTRAMUSCULAR

## 2020-11-07 NOTE — Progress Notes (Signed)
Pt is here for B12 injection. She denies any GI issues, dizziness.   Pt given injection in RD. She tolerated well.  She will RTC in 1 month for next inject

## 2020-11-07 NOTE — Progress Notes (Signed)
Agree with documentation as above.   Leatrice Parilla, MD  

## 2020-11-25 ENCOUNTER — Other Ambulatory Visit: Payer: Self-pay | Admitting: Family Medicine

## 2020-11-25 DIAGNOSIS — F411 Generalized anxiety disorder: Secondary | ICD-10-CM

## 2020-11-25 MED FILL — SUMATRIPTAN SUCC 100 MG TAB: 100 | 30 days supply | Qty: 9 | Fill #1

## 2020-11-25 MED FILL — AMPHETAMINE SALTS 10 MG: 10 | 30 days supply | Qty: 60 | Fill #0

## 2020-11-25 MED FILL — SERTRALINE HCL 50 MG TABLET: 50 | 90 days supply | Qty: 90 | Fill #0

## 2020-12-05 ENCOUNTER — Other Ambulatory Visit: Payer: Self-pay

## 2020-12-05 ENCOUNTER — Ambulatory Visit (INDEPENDENT_AMBULATORY_CARE_PROVIDER_SITE_OTHER): Payer: 59 | Admitting: Family Medicine

## 2020-12-05 VITALS — BP 135/82 | HR 63 | Temp 97.7°F | Wt 213.0 lb

## 2020-12-05 DIAGNOSIS — D510 Vitamin B12 deficiency anemia due to intrinsic factor deficiency: Secondary | ICD-10-CM

## 2020-12-05 MED ORDER — CYANOCOBALAMIN 1000 MCG/ML IJ SOLN
1000.0000 ug | Freq: Once | INTRAMUSCULAR | Status: AC
  Administered 2020-12-05: 1000 ug via INTRAMUSCULAR

## 2020-12-05 NOTE — Progress Notes (Signed)
Agree with documentation as above.   Kery Batzel, MD  

## 2020-12-05 NOTE — Progress Notes (Signed)
Patient is here for a vitamin B12 injection. Denies GI problems or dizziness.  Patient tolerated injection well w/o complications. Patient advised to schedule next injection in 30 days.   Location: [facility]

## 2021-01-02 ENCOUNTER — Other Ambulatory Visit: Payer: Self-pay

## 2021-01-02 ENCOUNTER — Ambulatory Visit (INDEPENDENT_AMBULATORY_CARE_PROVIDER_SITE_OTHER): Payer: 59 | Admitting: Family Medicine

## 2021-01-02 VITALS — BP 122/73 | HR 64 | Ht 64.0 in | Wt 216.0 lb

## 2021-01-02 DIAGNOSIS — D510 Vitamin B12 deficiency anemia due to intrinsic factor deficiency: Secondary | ICD-10-CM | POA: Diagnosis not present

## 2021-01-02 MED ORDER — CYANOCOBALAMIN 1000 MCG/ML IJ SOLN
1000.0000 ug | Freq: Once | INTRAMUSCULAR | Status: AC
  Administered 2021-01-02: 1000 ug via INTRAMUSCULAR

## 2021-01-02 NOTE — Progress Notes (Signed)
   Subjective:    Patient ID: Rose Long, female    DOB: Apr 17, 1961, 60 y.o.   MRN: [id number]  HPI Patient is here for a B12 injection. Denies gastrointestinal problems or dizziness.    Review of Systems     Objective:   Physical Exam        Assessment & Plan:  Patient tolerated injection well without complications.  Patient advised to schedule next injection in 30 days with labs just prior to assess levels per the notes on 09/04/2020 lab results. Lab orders entered and faxed to lab.

## 2021-01-02 NOTE — Progress Notes (Signed)
Agree with documentation as above.   Tiyana Galla, MD  

## 2021-01-05 ENCOUNTER — Other Ambulatory Visit: Payer: Self-pay | Admitting: Physician Assistant

## 2021-01-05 ENCOUNTER — Ambulatory Visit (INDEPENDENT_AMBULATORY_CARE_PROVIDER_SITE_OTHER): Payer: 59 | Admitting: Physician Assistant

## 2021-01-05 VITALS — BP 129/75 | HR 72 | Temp 98.5°F | Ht 64.0 in | Wt 217.0 lb

## 2021-01-05 DIAGNOSIS — J040 Acute laryngitis: Secondary | ICD-10-CM

## 2021-01-05 DIAGNOSIS — J40 Bronchitis, not specified as acute or chronic: Secondary | ICD-10-CM | POA: Diagnosis not present

## 2021-01-05 DIAGNOSIS — J329 Chronic sinusitis, unspecified: Secondary | ICD-10-CM

## 2021-01-05 MED ORDER — HYDROCODONE-HOMATROPINE 5-1.5 MG/5ML PO SYRP: 5.0000 mL | ORAL_SOLUTION | Freq: Every evening | ORAL | 0 refills | Status: DC | PRN

## 2021-01-05 MED ORDER — AMOXICILLIN-POT CLAVULANATE 875-125 MG PO TABS: 1.0000 | ORAL_TABLET | Freq: Two times a day (BID) | ORAL | 0 refills | Status: DC

## 2021-01-05 MED ORDER — METHYLPREDNISOLONE ACETATE 80 MG/ML IJ SUSP
80.0000 mg | Freq: Once | INTRAMUSCULAR | Status: AC
  Administered 2021-01-05: 80 mg via INTRAMUSCULAR

## 2021-01-05 MED FILL — AMOX TR-K CLV 875-125 MG TA: 875-125 | 10 days supply | Qty: 20 | Fill #0

## 2021-01-05 MED FILL — HYCODAN 5-1.5 MG/5ML SYRP: 5-1.5 | 12 days supply | Qty: 60 | Fill #0

## 2021-01-05 MED FILL — SUMATRIPTAN SUCCINATE 100 M: 100 | 30 days supply | Qty: 9 | Fill #2

## 2021-01-05 NOTE — Patient Instructions (Signed)
Laryngitis  Laryngitis is inflammation of the vocal cords that causes symptoms such as hoarseness or loss of voice. The vocal cords are two bands of muscles in your throat. When you speak, these cords come together and vibrate. The vibrations come out through your mouth as sound. When your vocal cords are inflamed, your voice sounds different. Laryngitis can be temporary (acute) or long-term (chronic). Most cases of acute laryngitis improve with time. Chronic laryngitis is laryngitis that lasts for more than 3 weeks. What are the causes? Acute laryngitis may be caused by:  A viral infection.  Lots of talking, yelling, or singing. This is also called vocal strain.  A bacterial infection. Chronic laryngitis may be caused by:  Vocal strain.  Injury to your vocal cords.  Acid reflux (gastroesophageal reflux disease, or GERD).  Allergies.  A sinus infection.  Smoking.  Alcohol abuse.  Breathing in chemicals or dust.  Growths on the vocal cords. What increases the risk? The following factors may make you more likely to develop this condition:  Smoking.  Alcohol abuse.  Having allergies.  Chronic irritants in the workplace, such as toxic fumes. What are the signs or symptoms? Symptoms of this condition may include:  Low, hoarse voice.  Loss of voice.  Dry cough.  Sore or dry throat.  Stuffy nose. How is this diagnosed? This condition may be diagnosed based on:  Your symptoms and a physical exam.  Throat culture.  Blood test.  A procedure in which your health care provider looks at your vocal cords with a mirror or viewing tube (laryngoscopy). How is this treated? Treatment for laryngitis depends on what is causing it.  Usually, treatment involves resting your voice and using medicines to soothe your throat.  If your laryngitis is caused by a bacterial infection, you may need to take antibiotic medicine.  If your laryngitis is caused by a growth, you may  need to have a procedure to remove it. Follow these instructions at home: Medicines  Take over-the-counter and prescription medicines only as told by your health care provider.  If you were prescribed an antibiotic medicine, take it as told by your health care provider. Do not stop taking the antibiotic even if you start to feel better. General instructions  Talk as little as possible. Also avoid whispering, which can cause vocal strain.  Write instead of talking. Do this until your voice is back to normal.  Drink enough fluid to keep your urine pale yellow.  Breathe in moist air. Use a humidifier if you live in a dry climate.  Do not use any products that contain nicotine or tobacco, such as cigarettes and e-cigarettes. If you need help quitting, ask your health care provider. Contact a health care provider if:  You have a fever.  You have increasing pain.  Your symptoms do not get better in 2 weeks. Get help right away if:  You cough up blood.  You have difficulty swallowing.  You have trouble breathing. Summary  Laryngitis is inflammation of the vocal cords that causes symptoms such as hoarseness or loss of voice.  Laryngitis can be temporary (acute) or long-term (chronic).  Treatment for laryngitis depends on the cause. It often involves resting your voice and using medicine to soothe your throat. This information is not intended to replace advice given to you by your health care provider. Make sure you discuss any questions you have with your health care provider. Document Revised: 09/21/2017 Document Reviewed: 09/21/2017 Elsevier Patient Education    [place].

## 2021-01-05 NOTE — Progress Notes (Signed)
Subjective:    Patient ID: Rose Long, female    DOB: 10/13/1961, 60 y.o.   MRN: [id number]  HPI  Pt is a 60 yo female who presents to the clinic with cough, ST, head/sinus pressure, ears popping, lost voice for little over a week. She was at a church event that was crowded and got sick a few days later. She is fully vaccinated against covid and tested negative at home. She is coughing up productive sptum from chest and nose. No fever, chills, body aches. She has had ST. No GI symptoms. She is taking OTC cough drops, mucinex, dayquil and nyquil. Her voice not improving at all. No labored breathing or wheezing.   .. Active Ambulatory Problems    Diagnosis Date Noted  . Migraine 11/23/2011  . Insomnia 08/06/2011  . Obesity 08/06/2011  . Primary osteoarthritis of right knee 05/04/2013  . Low back pain 05/21/2013  . Trochanteric bursitis of both hips 06/12/2013  . Abnormal facial hair 08/03/2013  . Hemorrhoids 06/12/2014  . Anemia 06/12/2014  . Primary osteoarthritis of left wrist 07/28/2015  . History of left knee replacement 11/25/2015  . Restless legs syndrome 07/19/2016  . 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 Rheumatoid arthritis ([place]) 08/15/2008  . Vitamin D deficiency 10/23/2019  . 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 Situational anxiety 10/23/2019  . BMI 33.0-33.9,adult 10/23/2019  . Hand arthritis 10/23/2019  . ADHD 08/18/2020  . Pernicious anemia 08/18/2020  . GAD (generalized anxiety disorder) 08/18/2020  . Elevated BP without diagnosis of hypertension 10/13/2020   Resolved Ambulatory Problems    Diagnosis Date Noted  . OTITIS MEDIA, ACUTE, LEFT 10/05/2009  . URI 06/27/2009  . Rheumatoid arthritis(714.0) 08/15/2008  . CONTACT DERMATITIS&OTHER ECZEMA DUE UNSPEC CAUSE 05/03/2011  . Throat pain 05/03/2011  . Chronic daily headache 11/23/2011  . Muscle spasm 11/23/2011  . Metatarsalgia of left foot  05/04/2013  . Olecranon bursitis of right elbow 06/16/2015  . Primary osteoarthritis of left knee 09/30/2015  . Primary osteoarthritis of knee 09/30/2015  . Closed fracture of proximal phalanx of right little finger 06/17/2016   Past Medical History:  Diagnosis Date  . Arthritis   . Bronchitis 2019  . Complication of anesthesia   . Family history of adverse reaction to anesthesia   . Simple ovarian cyst       Review of Systems See HPI.     Objective:   Physical Exam Vitals reviewed.  Constitutional:      Appearance: Normal appearance. She is obese.  HENT:     Head: Normocephalic.     Comments: Tenderness to palpation bilateral maxillary sinuses.     Right Ear: Tympanic membrane and external ear normal.     Left Ear: Tympanic membrane and external ear normal.     Nose: Congestion present.     Mouth/Throat:     Mouth: Mucous membranes are moist.     Comments: Erythematous oropharynx with clear PND. Hoarse voice.  No enlarged tonsils.  Eyes:     General:        Right eye: No discharge.     Conjunctiva/sclera: Conjunctivae normal.     Pupils: Pupils are equal, round, and reactive to light.  Cardiovascular:     Rate and Rhythm: Normal rate and regular rhythm.     Pulses: Normal pulses.     Heart sounds: Normal heart sounds.  Pulmonary:     Effort: Pulmonary effort is normal.  Breath sounds: Normal breath sounds. No wheezing or rhonchi.  Neurological:     General: No focal deficit present.     Mental Status: She is alert and oriented to person, place, and time.  Psychiatric:        Mood and Affect: Mood normal.           Assessment & Plan:  Marland KitchenMarland KitchenZayana was seen today for cough.  Diagnoses and all orders for this visit:  Sinobronchitis -     HYDROcodone-homatropine (HYCODAN) 5-1.5 MG/5ML syrup; Take 5 mLs by mouth at bedtime as needed for cough. -     amoxicillin-clavulanate (AUGMENTIN) 875-125 MG tablet; Take 1 tablet by mouth 2 (two) times daily for 10  days. -     methylPREDNISolone acetate (DEPO-MEDROL) injection 80 mg  Laryngitis -     amoxicillin-clavulanate (AUGMENTIN) 875-125 MG tablet; Take 1 tablet by mouth 2 (two) times daily for 10 days. -     methylPREDNISolone acetate (DEPO-MEDROL) injection 80 mg   covid vaccinated/history of covid/testing negative with home rapid.  Sounds like URI progressed to sinusitis/bronchitis and laryngitis.  Depomedrol given IM today.  Start augmentin for 10 days.  Hycodan for cough.  Discussed honey tea and voice rest.  Cut back on talking to avoid voice strain.  HO given.  Encouraged cough drops during the day.

## 2021-01-06 ENCOUNTER — Encounter: Payer: Self-pay | Admitting: Physician Assistant

## 2021-01-29 ENCOUNTER — Other Ambulatory Visit: Payer: Self-pay

## 2021-01-29 ENCOUNTER — Ambulatory Visit (INDEPENDENT_AMBULATORY_CARE_PROVIDER_SITE_OTHER): Payer: 59 | Admitting: Physician Assistant

## 2021-01-29 VITALS — BP 129/67 | HR 61 | Ht 64.0 in | Wt 217.0 lb

## 2021-01-29 DIAGNOSIS — D510 Vitamin B12 deficiency anemia due to intrinsic factor deficiency: Secondary | ICD-10-CM

## 2021-01-29 MED ORDER — CYANOCOBALAMIN 1000 MCG/ML IJ SOLN
1000.0000 ug | Freq: Once | INTRAMUSCULAR | Status: AC
  Administered 2021-01-29: 1000 ug via INTRAMUSCULAR

## 2021-01-29 NOTE — Progress Notes (Signed)
Pt is here for a vitamin B12 injection. Denies GI problems or dizziness.  Pt tolerated injection well without complication. Patient advised to schedule next injection in 30 days.  Location: [facility]

## 2021-01-30 ENCOUNTER — Ambulatory Visit: Payer: 59

## 2021-02-02 NOTE — Progress Notes (Signed)
Patient ID: Rose Long, female   DOB: 05-18-1961, 60 y.o.   MRN: [id number] Agree with above plan.

## 2021-02-19 ENCOUNTER — Encounter: Payer: Self-pay | Admitting: Family Medicine

## 2021-02-19 ENCOUNTER — Other Ambulatory Visit: Payer: Self-pay | Admitting: Family Medicine

## 2021-02-19 ENCOUNTER — Other Ambulatory Visit (HOSPITAL_BASED_OUTPATIENT_CLINIC_OR_DEPARTMENT_OTHER): Payer: Self-pay

## 2021-02-19 ENCOUNTER — Ambulatory Visit (INDEPENDENT_AMBULATORY_CARE_PROVIDER_SITE_OTHER): Payer: 59 | Admitting: Family Medicine

## 2021-02-19 VITALS — BP 130/84 | HR 55 | Ht 64.0 in | Wt 221.0 lb

## 2021-02-19 DIAGNOSIS — Z1231 Encounter for screening mammogram for malignant neoplasm of breast: Secondary | ICD-10-CM | POA: Diagnosis not present

## 2021-02-19 DIAGNOSIS — Z0000 Encounter for general adult medical examination without abnormal findings: Secondary | ICD-10-CM | POA: Diagnosis not present

## 2021-02-19 DIAGNOSIS — G43919 Migraine, unspecified, intractable, without status migrainosus: Secondary | ICD-10-CM

## 2021-02-19 DIAGNOSIS — F411 Generalized anxiety disorder: Secondary | ICD-10-CM

## 2021-02-19 DIAGNOSIS — E559 Vitamin D deficiency, unspecified: Secondary | ICD-10-CM | POA: Diagnosis not present

## 2021-02-19 DIAGNOSIS — Z7689 Persons encountering health services in other specified circumstances: Secondary | ICD-10-CM | POA: Diagnosis not present

## 2021-02-19 DIAGNOSIS — F902 Attention-deficit hyperactivity disorder, combined type: Secondary | ICD-10-CM

## 2021-02-19 MED ORDER — EMGALITY 120 MG/ML ~~LOC~~ SOAJ
240.0000 mg | Freq: Once | SUBCUTANEOUS | 3 refills | Status: AC
  Filled 2021-02-19: qty 2, 28d supply, fill #0
  Filled 2021-03-23: qty 2, 28d supply, fill #1
  Filled 2021-03-27: qty 1, 28d supply, fill #1
  Filled 2021-03-27: qty 2, 28d supply, fill #1
  Filled 2021-04-24: qty 1, 28d supply, fill #2

## 2021-02-19 MED ORDER — WEGOVY 0.25 MG/0.5ML ~~LOC~~ SOAJ
0.2500 mg | SUBCUTANEOUS | 0 refills | Status: DC
Start: 2021-02-19 — End: 2021-04-24
  Filled 2021-02-19 – 2021-03-27 (×3): qty 2, 28d supply, fill #0

## 2021-02-19 MED ORDER — SERTRALINE HCL 50 MG PO TABS
50.0000 mg | ORAL_TABLET | Freq: Every day | ORAL | 1 refills | Status: DC
  Filled 2021-02-27: qty 90, 90d supply, fill #0
  Filled 2021-06-11: qty 90, 90d supply, fill #1

## 2021-02-19 MED ORDER — CHOLECALCIFEROL 1.25 MG (50000 UT) PO CAPS
50000.0000 [IU] | ORAL_CAPSULE | ORAL | 3 refills | Status: DC
  Filled 2021-02-19: qty 12, 84d supply, fill #0
  Filled 2021-06-11: qty 12, 84d supply, fill #1
  Filled 2021-08-28 – 2021-10-05 (×2): qty 12, 84d supply, fill #2
  Filled 2021-11-16: qty 12, 84d supply, fill #3

## 2021-02-19 MED ORDER — SUMATRIPTAN SUCCINATE 100 MG PO TABS
ORAL_TABLET | ORAL | 5 refills | Status: DC
  Filled 2021-02-19: qty 9, 30d supply, fill #0
  Filled 2021-03-27: qty 9, 30d supply, fill #1
  Filled 2021-04-24: qty 9, 30d supply, fill #2
  Filled 2021-06-11: qty 9, 30d supply, fill #3
  Filled 2021-08-28 – 2021-10-05 (×2): qty 9, 30d supply, fill #4
  Filled 2021-11-16: qty 9, 30d supply, fill #5

## 2021-02-19 MED ORDER — FERROUS SULFATE 325 (65 FE) MG PO TABS
325.0000 mg | ORAL_TABLET | Freq: Every day | ORAL | 3 refills | Status: DC
  Filled 2021-02-19: qty 100, 100d supply, fill #0
  Filled 2021-06-11: qty 100, 100d supply, fill #1
  Filled 2021-08-28 – 2022-01-07 (×2): qty 100, 100d supply, fill #2

## 2021-02-19 NOTE — Progress Notes (Signed)
Subjective:     Rose Long is a 60 y.o. female and is here for a comprehensive physical exam. The patient reports problems - Biggest concern today is that she has gained some weight and is really struggling.  She is not currently exercising regularly.  Status post Roux-en-Y.  Social History   Socioeconomic History  . Marital status: Married    Spouse name: Not on file  . Number of children: 0  . Years of education: Masters  . Highest education level: Not on file  Occupational History  . Occupation: Therapist    Comment: [employer]  Tobacco Use  . Smoking status: Never Smoker  . Smokeless tobacco: Never Used  Vaping Use  . Vaping Use: Never used  Substance and Sexual Activity  . Alcohol use: Yes    Alcohol/week: 1.0 standard drink    Types: 1 Standard drinks or equivalent per week    Comment: socially  . Drug use: No  . Sexual activity: Yes    Birth control/protection: None  Other Topics Concern  . Not on file  Social History Narrative   Parents moved into her home on June 2013. Lives with her spouse.  2 caffeine drinks per day.  Takes a 30 minute walk 7 days a week. Left-handed   Social Determinants of Health   Financial Resource Strain: Not on file  Food Insecurity: Not on file  Transportation Needs: Not on file  Physical Activity: Not on file  Stress: Not on file  Social Connections: Not on file  Intimate Partner Violence: Not on file   Health Maintenance  Topic Date Due  . INFLUENZA VACCINE  05/18/2021  . MAMMOGRAM  12/26/2021  . TETANUS/TDAP  11/17/2022  . COLONOSCOPY (Pts 45-33yrs Insurance coverage will need to be confirmed)  11/18/2023  . PAP SMEAR-Modifier  03/24/2025  . COVID-19 Vaccine  Completed  . Hepatitis C Screening  Completed  . HIV Screening  Completed  . HPV VACCINES  Aged Out    The following portions of the patient's history were reviewed and updated as appropriate: allergies, current medications, past family history, past medical  history, past social history, past surgical history and problem list.  Review of Systems A comprehensive review of systems was negative.   Objective:    BP 130/84   Pulse (!) 55   Ht 5\' 4"  (1.626 m)   Wt 221 lb (100.2 kg)   SpO2 100%   BMI 37.93 kg/m  General appearance: alert, cooperative and appears stated age Head: Normocephalic, without obvious abnormality, atraumatic Eyes: conj clear, EOMI, PEERLA Ears: normal TM's and external ear canals both ears Nose: Nares normal. Septum midline. Mucosa normal. No drainage or sinus tenderness. Throat: lips, mucosa, and tongue normal; teeth and gums normal Neck: no adenopathy, no carotid bruit, no JVD, supple, symmetrical, trachea midline and thyroid not enlarged, symmetric, no tenderness/mass/nodules Back: symmetric, no curvature. ROM normal. No CVA tenderness. Lungs: clear to auscultation bilaterally Breasts: normal appearance, no masses or tenderness, incision well healed. She has a epidermal cyst at the nipple.   Heart: regular rate and rhythm, S1, S2 normal, no murmur, click, rub or gallop Abdomen: soft, non-tender; bowel sounds normal; no masses,  no organomegaly Extremities: extremities normal, atraumatic, no cyanosis or edema Pulses: 2+ and symmetric Skin: Skin color, texture, turgor normal. No rashes or lesions Lymph nodes: Cervical, supraclavicular, and axillary nodes normal. Neurologic: Grossly normal    Assessment:    Healthy female exam     Plan:  See After Visit Summary for Counseling Recommendations   Keep up a regular exercise program and make sure you are eating a healthy diet Try to eat 4 servings of dairy a day, or if you are lactose intolerant take a calcium with vitamin D daily.  Your vaccines are up to date.   S/P post Roux-en-Y-we did check for additional mineral deficiencies today.  We also discussed a trial of Ozempic.  She has tried [place] in the past and did get some beneficial results initially.  But  it seemed to eventually wear off.  We also discussed getting back on track with regular exercise.  Weight management - see note above.  If approved, f/U in 4 weeks.  Discussed goal setting.    Migraines - consider trial of Emgality.  F/U in 8 weeks

## 2021-02-19 NOTE — Assessment & Plan Note (Signed)
Current weight: 221 lbs  Previous weight: Change in weight: Goal weight: Dietary goals: Nutritional goals: Medication: Wegovy  Follow-up and referrals:

## 2021-02-19 NOTE — Patient Instructions (Signed)
Preventive Care 84-59 Years Old, Female Preventive care refers to lifestyle choices and visits with your health care provider that can promote health and wellness. This includes:  A yearly physical exam. This is also called an annual wellness visit.  Regular dental and eye exams.  Immunizations.  Screening for certain conditions.  Healthy lifestyle choices, such as: ? Eating a healthy diet. ? Getting regular exercise. ? Not using drugs or products that contain nicotine and tobacco. ? Limiting alcohol use. What can I expect for my preventive care visit? Physical exam Your health care provider will check your:  Height and weight. These may be used to calculate your BMI (body mass index). BMI is a measurement that tells if you are at a healthy weight.  Heart rate and blood pressure.  Body temperature.  Skin for abnormal spots. Counseling Your health care provider may ask you questions about your:  Past medical problems.  Family's medical history.  Alcohol, tobacco, and drug use.  Emotional well-being.  Home life and relationship well-being.  Sexual activity.  Diet, exercise, and sleep habits.  Work and work Statistician.  Access to firearms.  Method of birth control.  Menstrual cycle.  Pregnancy history. What immunizations do I need? Vaccines are usually given at various ages, according to a schedule. Your health care provider will recommend vaccines for you based on your age, medical history, and lifestyle or other factors, such as travel or where you work.   What tests do I need? Blood tests  Lipid and cholesterol levels. These may be checked every 5 years, or more often if you are over 59 years old.  Hepatitis C test.  Hepatitis B test. Screening  Lung cancer screening. You may have this screening every year starting at age 59 if you have a 30-pack-year history of smoking and currently smoke or have quit within the past 15 years.  Colorectal cancer  screening. ? All adults should have this screening starting at age 59 and continuing until age 17. ? Your health care provider may recommend screening at age 59 if you are at increased risk. ? You will have tests every 1-10 years, depending on your results and the type of screening test.  Diabetes screening. ? This is done by checking your blood sugar (glucose) after you have not eaten for a while (fasting). ? You may have this done every 1-3 years.  Mammogram. ? This may be done every 59-2 years. ? Talk with your health care provider about when you should start having regular mammograms. This may depend on whether you have a family history of breast cancer.  BRCA-related cancer screening. This may be done if you have a family history of breast, ovarian, tubal, or peritoneal cancers.  Pelvic exam and Pap test. ? This may be done every 3 years starting at age 10. ? Starting at age 11, this may be done every 5 years if you have a Pap test in combination with an HPV test. Other tests  STD (sexually transmitted disease) testing, if you are at risk.  Bone density scan. This is done to screen for osteoporosis. You may have this scan if you are at high risk for osteoporosis. Talk with your health care provider about your test results, treatment options, and if necessary, the need for more tests. Follow these instructions at home: Eating and drinking  Eat a diet that includes fresh fruits and vegetables, whole grains, lean protein, and low-fat dairy products.  Take vitamin and mineral supplements  as recommended by your health care provider.  Do not drink alcohol if: ? Your health care provider tells you not to drink. ? You are pregnant, may be pregnant, or are planning to become pregnant.  If you drink alcohol: ? Limit how much you have to 0-1 drink a day. ? Be aware of how much alcohol is in your drink. In the [place] one drink equals one 12 oz bottle of beer (355 mL), one 5 oz glass of  wine (148 mL), or one 1 oz glass of hard liquor (44 mL).   Lifestyle  Take daily care of your teeth and gums. Brush your teeth every morning and night with fluoride toothpaste. Floss one time each day.  Stay active. Exercise for at least 30 minutes 5 or more days each week.  Do not use any products that contain nicotine or tobacco, such as cigarettes, e-cigarettes, and chewing tobacco. If you need help quitting, ask your health care provider.  Do not use drugs.  If you are sexually active, practice safe sex. Use a condom or other form of protection to prevent STIs (sexually transmitted infections).  If you do not wish to become pregnant, use a form of birth control. If you plan to become pregnant, see your health care provider for a prepregnancy visit.  If told by your health care provider, take low-dose aspirin daily starting at age 50.  Find healthy ways to cope with stress, such as: ? Meditation, yoga, or listening to music. ? Journaling. ? Talking to a trusted person. ? Spending time with friends and family. Safety  Always wear your seat belt while driving or riding in a vehicle.  Do not drive: ? If you have been drinking alcohol. Do not ride with someone who has been drinking. ? When you are tired or distracted. ? While texting.  Wear a helmet and other protective equipment during sports activities.  If you have firearms in your house, make sure you follow all gun safety procedures. What's next?  Visit your health care provider once a year for an annual wellness visit.  Ask your health care provider how often you should have your eyes and teeth checked.  Stay up to date on all vaccines. This information is not intended to replace advice given to you by your health care provider. Make sure you discuss any questions you have with your health care provider. Document Revised: 07/08/2020 Document Reviewed: 06/15/2018 Elsevier Patient Education  2021 Elsevier Inc.  

## 2021-02-23 ENCOUNTER — Other Ambulatory Visit (HOSPITAL_BASED_OUTPATIENT_CLINIC_OR_DEPARTMENT_OTHER): Payer: Self-pay

## 2021-02-23 MED ORDER — AMPHETAMINE-DEXTROAMPHETAMINE 10 MG PO TABS
10.0000 mg | ORAL_TABLET | Freq: Two times a day (BID) | ORAL | 0 refills | Status: DC
  Filled 2021-02-23: qty 60, 30d supply, fill #0

## 2021-02-24 ENCOUNTER — Other Ambulatory Visit (HOSPITAL_BASED_OUTPATIENT_CLINIC_OR_DEPARTMENT_OTHER): Payer: Self-pay

## 2021-02-27 ENCOUNTER — Other Ambulatory Visit (HOSPITAL_BASED_OUTPATIENT_CLINIC_OR_DEPARTMENT_OTHER): Payer: Self-pay

## 2021-03-02 ENCOUNTER — Other Ambulatory Visit (HOSPITAL_BASED_OUTPATIENT_CLINIC_OR_DEPARTMENT_OTHER): Payer: Self-pay

## 2021-03-03 ENCOUNTER — Other Ambulatory Visit (HOSPITAL_BASED_OUTPATIENT_CLINIC_OR_DEPARTMENT_OTHER): Payer: Self-pay

## 2021-03-04 ENCOUNTER — Other Ambulatory Visit (HOSPITAL_BASED_OUTPATIENT_CLINIC_OR_DEPARTMENT_OTHER): Payer: Self-pay

## 2021-03-05 DIAGNOSIS — Z0000 Encounter for general adult medical examination without abnormal findings: Secondary | ICD-10-CM | POA: Diagnosis not present

## 2021-03-06 ENCOUNTER — Other Ambulatory Visit (HOSPITAL_BASED_OUTPATIENT_CLINIC_OR_DEPARTMENT_OTHER): Payer: Self-pay

## 2021-03-09 ENCOUNTER — Other Ambulatory Visit (HOSPITAL_BASED_OUTPATIENT_CLINIC_OR_DEPARTMENT_OTHER): Payer: Self-pay

## 2021-03-10 ENCOUNTER — Other Ambulatory Visit (HOSPITAL_BASED_OUTPATIENT_CLINIC_OR_DEPARTMENT_OTHER): Payer: Self-pay

## 2021-03-11 ENCOUNTER — Other Ambulatory Visit (HOSPITAL_BASED_OUTPATIENT_CLINIC_OR_DEPARTMENT_OTHER): Payer: Self-pay

## 2021-03-11 LAB — COMPLETE METABOLIC PANEL WITH GFR
AG Ratio: 1.3 (calc) (ref 1.0–2.5)
ALT: 14 U/L (ref 6–29)
AST: 18 U/L (ref 10–35)
Albumin: 4 g/dL (ref 3.6–5.1)
Alkaline phosphatase (APISO): 89 U/L (ref 37–153)
BUN: 12 mg/dL (ref 7–25)
CO2: 29 mmol/L (ref 20–32)
Calcium: 10 mg/dL (ref 8.6–10.4)
Chloride: 104 mmol/L (ref 98–110)
Creat: 0.7 mg/dL (ref 0.50–1.05)
GFR, Est African American: 111 mL/min/{1.73_m2} (ref >=60)
GFR, Est Non African American: 96 mL/min/{1.73_m2} (ref >=60)
Globulin: 3.1 g/dL (calc) (ref 1.9–3.7)
Glucose, Bld: 90 mg/dL (ref 65–139)
Potassium: 5.5 mmol/L — ABNORMAL HIGH (ref 3.5–5.3)
Sodium: 140 mmol/L (ref 135–146)
Total Bilirubin: 0.3 mg/dL (ref 0.2–1.2)
Total Protein: 7.1 g/dL (ref 6.1–8.1)

## 2021-03-11 LAB — VITAMIN B1: Vitamin B1 (Thiamine): 31 nmol/L — ABNORMAL HIGH (ref 8–30)

## 2021-03-11 LAB — MAGNESIUM: Magnesium: 2 mg/dL (ref 1.5–2.5)

## 2021-03-11 LAB — VITAMIN B12: Vitamin B-12: 465 pg/mL (ref 200–1100)

## 2021-03-11 LAB — FOLATE: Folate: 14.6 ng/mL

## 2021-03-11 LAB — LIPID PANEL W/REFLEX DIRECT LDL
Cholesterol: 224 mg/dL — ABNORMAL HIGH (ref <200)
HDL: 64 mg/dL (ref >=50)
LDL Cholesterol (Calc): 141 mg/dL (calc) — ABNORMAL HIGH
Non-HDL Cholesterol (Calc): 160 mg/dL (calc) — ABNORMAL HIGH (ref <130)
Total CHOL/HDL Ratio: 3.5 (calc) (ref <5.0)
Triglycerides: 84 mg/dL (ref <150)

## 2021-03-11 LAB — VITAMIN B6: Vitamin B6: 64.7 ng/mL — ABNORMAL HIGH (ref 2.1–21.7)

## 2021-03-11 LAB — VITAMIN D 25 HYDROXY (VIT D DEFICIENCY, FRACTURES): Vit D, 25-Hydroxy: 34 ng/mL (ref 30–100)

## 2021-03-13 ENCOUNTER — Other Ambulatory Visit (HOSPITAL_BASED_OUTPATIENT_CLINIC_OR_DEPARTMENT_OTHER): Payer: Self-pay

## 2021-03-17 ENCOUNTER — Other Ambulatory Visit (HOSPITAL_BASED_OUTPATIENT_CLINIC_OR_DEPARTMENT_OTHER): Payer: Self-pay

## 2021-03-18 ENCOUNTER — Other Ambulatory Visit (HOSPITAL_BASED_OUTPATIENT_CLINIC_OR_DEPARTMENT_OTHER): Payer: Self-pay

## 2021-03-19 ENCOUNTER — Ambulatory Visit: Payer: 59 | Admitting: Family Medicine

## 2021-03-20 ENCOUNTER — Other Ambulatory Visit (HOSPITAL_BASED_OUTPATIENT_CLINIC_OR_DEPARTMENT_OTHER): Payer: Self-pay

## 2021-03-23 ENCOUNTER — Other Ambulatory Visit (HOSPITAL_BASED_OUTPATIENT_CLINIC_OR_DEPARTMENT_OTHER): Payer: Self-pay

## 2021-03-24 ENCOUNTER — Telehealth: Payer: Self-pay

## 2021-03-24 ENCOUNTER — Other Ambulatory Visit (HOSPITAL_BASED_OUTPATIENT_CLINIC_OR_DEPARTMENT_OTHER): Payer: Self-pay

## 2021-03-24 NOTE — Telephone Encounter (Signed)
PA submitted for Emgality, awaiting response.

## 2021-03-25 ENCOUNTER — Other Ambulatory Visit (HOSPITAL_BASED_OUTPATIENT_CLINIC_OR_DEPARTMENT_OTHER): Payer: Self-pay

## 2021-03-25 DIAGNOSIS — L820 Inflamed seborrheic keratosis: Secondary | ICD-10-CM | POA: Diagnosis not present

## 2021-03-25 DIAGNOSIS — L570 Actinic keratosis: Secondary | ICD-10-CM | POA: Diagnosis not present

## 2021-03-25 DIAGNOSIS — L578 Other skin changes due to chronic exposure to nonionizing radiation: Secondary | ICD-10-CM | POA: Diagnosis not present

## 2021-03-25 DIAGNOSIS — X32XXXS Exposure to sunlight, sequela: Secondary | ICD-10-CM | POA: Diagnosis not present

## 2021-03-25 DIAGNOSIS — L814 Other melanin hyperpigmentation: Secondary | ICD-10-CM | POA: Diagnosis not present

## 2021-03-27 ENCOUNTER — Other Ambulatory Visit: Payer: Self-pay | Admitting: *Deleted

## 2021-03-27 ENCOUNTER — Other Ambulatory Visit (HOSPITAL_BASED_OUTPATIENT_CLINIC_OR_DEPARTMENT_OTHER): Payer: Self-pay

## 2021-03-27 DIAGNOSIS — D510 Vitamin B12 deficiency anemia due to intrinsic factor deficiency: Secondary | ICD-10-CM | POA: Diagnosis not present

## 2021-03-27 NOTE — Telephone Encounter (Signed)
Emgality approved. Effective 04/18/21 - 09/17/21 for 5 fills and is limited to 81mL (1 pen) per month.  Pharmacy notified.

## 2021-03-28 LAB — VITAMIN B12: Vitamin B-12: 404 pg/mL (ref 200–1100)

## 2021-03-30 ENCOUNTER — Other Ambulatory Visit (HOSPITAL_BASED_OUTPATIENT_CLINIC_OR_DEPARTMENT_OTHER): Payer: Self-pay

## 2021-03-31 ENCOUNTER — Other Ambulatory Visit (HOSPITAL_BASED_OUTPATIENT_CLINIC_OR_DEPARTMENT_OTHER): Payer: Self-pay

## 2021-04-01 ENCOUNTER — Other Ambulatory Visit (HOSPITAL_BASED_OUTPATIENT_CLINIC_OR_DEPARTMENT_OTHER): Payer: Self-pay

## 2021-04-02 ENCOUNTER — Other Ambulatory Visit (HOSPITAL_BASED_OUTPATIENT_CLINIC_OR_DEPARTMENT_OTHER): Payer: Self-pay

## 2021-04-23 ENCOUNTER — Telehealth: Payer: Self-pay | Admitting: *Deleted

## 2021-04-23 NOTE — Telephone Encounter (Signed)
Pt wanted to know since she is unable to get the [facility] can Dr. Madilyn Fireman send a prescription in for Ozempic instead for her to start for weight loss?

## 2021-04-24 ENCOUNTER — Other Ambulatory Visit (HOSPITAL_BASED_OUTPATIENT_CLINIC_OR_DEPARTMENT_OTHER): Payer: Self-pay

## 2021-04-24 MED ORDER — OZEMPIC (0.25 OR 0.5 MG/DOSE) 2 MG/1.5ML ~~LOC~~ SOPN
0.2500 mg | PEN_INJECTOR | SUBCUTANEOUS | 0 refills | Status: DC
  Filled 2021-04-24: qty 1.5, 56d supply, fill #0

## 2021-04-24 NOTE — Telephone Encounter (Signed)
Meds ordered this encounter  Medications   Semaglutide,0.25 or 0.5MG /DOS, (OZEMPIC, 0.25 OR 0.5 MG/DOSE,) 2 MG/1.5ML SOPN    Sig: Inject 0.25 mg into the skin once a week.    Dispense:  1.5 mL    Refill:  0

## 2021-04-28 NOTE — Telephone Encounter (Signed)
Spoke w/pt advised to schedule f/u in 4 weeks to see how she is doing on medication. She voiced understanding and agreed

## 2021-05-01 ENCOUNTER — Ambulatory Visit
Admission: RE | Admit: 2021-05-01 | Discharge: 2021-05-01 | Disposition: A | Discharge status: Home / Self Care | Payer: 59 | Source: Ambulatory Visit | Attending: Family Medicine | Admitting: Family Medicine

## 2021-05-01 ENCOUNTER — Other Ambulatory Visit: Payer: Self-pay

## 2021-05-01 DIAGNOSIS — Z1231 Encounter for screening mammogram for malignant neoplasm of breast: Secondary | ICD-10-CM | POA: Diagnosis not present

## 2021-05-08 ENCOUNTER — Other Ambulatory Visit (HOSPITAL_BASED_OUTPATIENT_CLINIC_OR_DEPARTMENT_OTHER): Payer: Self-pay

## 2021-05-21 ENCOUNTER — Ambulatory Visit: Payer: 59 | Admitting: Family Medicine

## 2021-05-22 ENCOUNTER — Other Ambulatory Visit: Payer: Self-pay

## 2021-05-22 ENCOUNTER — Ambulatory Visit: Payer: 59 | Admitting: Physician Assistant

## 2021-05-22 ENCOUNTER — Ambulatory Visit (INDEPENDENT_AMBULATORY_CARE_PROVIDER_SITE_OTHER): Payer: 59 | Admitting: Physician Assistant

## 2021-05-22 ENCOUNTER — Encounter: Payer: Self-pay | Admitting: Physician Assistant

## 2021-05-22 VITALS — BP 129/72 | HR 68 | Temp 98.8°F | Ht 64.0 in | Wt 222.0 lb

## 2021-05-22 DIAGNOSIS — Z980 Intestinal bypass and anastomosis status: Secondary | ICD-10-CM

## 2021-05-22 DIAGNOSIS — Z903 Acquired absence of stomach [part of]: Secondary | ICD-10-CM

## 2021-05-22 DIAGNOSIS — Z23 Encounter for immunization: Secondary | ICD-10-CM | POA: Diagnosis not present

## 2021-05-22 DIAGNOSIS — R4702 Dysphasia: Secondary | ICD-10-CM | POA: Diagnosis not present

## 2021-05-22 MED ORDER — PANTOPRAZOLE SODIUM 40 MG PO TBEC: 40.0000 mg | DELAYED_RELEASE_TABLET | Freq: Every day | ORAL | 0 refills | Status: DC

## 2021-05-22 NOTE — Progress Notes (Signed)
Subjective:    Patient ID: Rose Long, female    DOB: 1961-03-15, 60 y.o.   MRN: [id number]  HPI Pt is a 60 yo obese female status post gastrectomy who presents to the clinic to discuss almost a year with a sensation of lump in throat and some increasing swallowing difficulty.   She noticed this starting after a mission trip to [place] last year but seems like getting worse in the last month. 2 weeks ago she actually choked for the first time on salad in a restaurant and threw it back up in her plate. Denies any weight loss. She has not even started ozempic yet. Denies any reflux symptoms.   .. 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   Rheumatoid arthritis ([place]) 08/15/2008   Vitamin D deficiency 10/23/2019   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   Situational anxiety 10/23/2019   BMI 33.0-33.9,adult 10/23/2019   Hand arthritis 10/23/2019   ADHD 08/18/2020   Pernicious anemia 08/18/2020   GAD (generalized anxiety disorder) 08/18/2020   Elevated BP without diagnosis of hypertension 10/13/2020   Encounter for weight management 02/19/2021   Dysphasia 05/22/2021   Resolved Ambulatory Problems    Diagnosis Date Noted   OTITIS MEDIA, ACUTE, LEFT 10/05/2009   URI 06/27/2009   Rheumatoid arthritis(714.0) 08/15/2008   CONTACT DERMATITIS&OTHER ECZEMA DUE UNSPEC CAUSE 05/03/2011   Throat pain 05/03/2011   Chronic daily headache 11/23/2011   Muscle spasm 11/23/2011   Metatarsalgia  of left foot 05/04/2013   Olecranon bursitis of right elbow 06/16/2015   Primary osteoarthritis of left knee 09/30/2015   Primary osteoarthritis of knee 09/30/2015   Closed fracture of proximal phalanx of right little finger 06/17/2016   Past Medical History:  Diagnosis Date   Arthritis    Bronchitis [id number]   Complication of anesthesia    Family history of adverse reaction to anesthesia    Simple ovarian cyst      Review of Systems See HPI.     Objective:   Physical Exam Vitals reviewed.  Constitutional:      Appearance: Normal appearance. She is obese.  HENT:     Head: Normocephalic.  Neck:     Vascular: No carotid bruit.     Comments: No thyroid enlargement.  Cardiovascular:     Rate and Rhythm: Normal rate and regular rhythm.     Pulses: Normal pulses.     Heart sounds: Normal heart sounds.  Pulmonary:     Effort: Pulmonary effort is normal.     Breath sounds: Normal breath sounds.  Musculoskeletal:        General: Normal range of motion.     Cervical back: Normal range of motion and neck supple. No tenderness.  Lymphadenopathy:     Cervical: No cervical adenopathy.  Neurological:     General: No focal deficit present.     Mental Status: She is alert and oriented to person, place, and time.  Psychiatric:        Mood and Affect:  Mood normal.          Assessment & Plan:   Marland KitchenMarland KitchenDesja was seen today for dysphagia.  Diagnoses and all orders for this visit:  Dysphasia -     pantoprazole (PROTONIX) 40 MG tablet; Take 1 tablet (40 mg total) by mouth daily. -     US Soft Tissue Head/Neck (NON-THYROID); Future -     Ambulatory referral to Gastroenterology  Need for shingles vaccine -     Varicella-zoster vaccine IM  Status post total gastrectomy and Roux-en-Y esophagojejunal anastomosis  Declines shingrix.  Encouraged patient to get covid booster before traveling.   Will order u/s of neck to look at thyroid and other soft tissue.  Start protonix daily.  Discussed chewing food really well before swallowing. Referral made for GI.

## 2021-05-22 NOTE — Patient Instructions (Addendum)
Will refer to GI.  Will get u/s.  Start protonix.   Bradley's Neurology in Page (8th ed., pp. [phone]). [place], [place]: Elsevier."> Marya Amsler of Surgery (21st ed., pp. [phone]). [place], [place]: Else[employer] Inc.">  Dysphagia  Dysphagia is trouble swallowing. This condition occurs when solids and liquids stick in a person's throat on the way down to the stomach, or when food takeslonger to get to the stomach than usual. You may have problems swallowing food, liquids, or both. You may also have pain while trying to swallow. It may take you more time and effort to swallowsomething. What are the causes? This condition may be caused by: Muscle problems. These may make it difficult for you to move food and liquids through the esophagus, which is the tube that connects your mouth to your stomach. Blockages. You may have ulcers, scar tissue, or inflammation that blocks the normal passage of food and liquids. Causes of these problems include: Acid reflux from your stomach into your esophagus (gastroesophageal reflux). Infections. Radiation treatment for cancer. Medicines taken without enough fluids to wash them down into your stomach. Stroke. This can affect the nerves and make it difficult to swallow. Nerve problems. These prevent signals from being sent to the muscles of your esophagus to squeeze (contract) and move what you swallow down to your stomach. Globus pharyngeus. This is a common problem that involves a feeling like something is stuck in your throat or a sense of trouble with swallowing, even though nothing is wrong with the swallowing passages. Certain conditions, such as cerebral palsy or Parkinson's disease. What are the signs or symptoms? Common symptoms of this condition include: A feeling that solids or liquids are stuck in your throat on the way down to the stomach. Pain while swallowing. Coughing or gagging while trying to swallow. Other symptoms  include: Food moving back from your stomach to your mouth (regurgitation). Noises coming from your throat. Chest discomfort when swallowing. A feeling of fullness when swallowing. Drooling, especially when the throat is blocked. Heartburn. How is this diagnosed? This condition may be diagnosed by: Barium swallow X-ray. In this test, you will swallow a white liquid that sticks to the inside of your esophagus. X-ray images are then taken. Endoscopy. In this test, a flexible telescope is inserted down your throat to look at your esophagus and your stomach. CT scans or an MRI. How is this treated? Treatment for dysphagia depends on the cause of this condition: If the dysphagia is caused by acid reflux or infection, medicines may be used. These may include antibiotics or heartburn medicines. If the dysphagia is caused by problems with the muscles, swallowing therapy may be used to help you strengthen your swallowing muscles. You may have to do specific exercises to strengthen the muscles or stretch them. If the dysphagia is caused by a blockage or mass, procedures to remove the blockage may be done. You may need surgery and a feeding tube. You may need to make diet changes. Ask your health care provider for specificinstructions. Follow these instructions at home: Medicines Take over-the-counter and prescription medicines only as told by your health care provider. If you were prescribed an antibiotic medicine, take it as told by your health care provider. Do not stop taking the antibiotic even if you start to feel better. Eating and drinking  Make any diet changes as told by your health care provider. Work with a diet and nutrition specialist (dietitian) to create an eating plan that will help you get  the nutrients you need in order to stay healthy. Eat soft foods that are easier to swallow. Cut your food into small pieces and eat slowly. Take small bites. Eat and drink only when you are  sitting upright. Do not drink alcohol or caffeine. If you need help quitting, ask your health care provider.  General instructions Check your weight every day to make sure you are not losing weight. Do not use any products that contain nicotine or tobacco. These products include cigarettes, chewing tobacco, and vaping devices, such as e-cigarettes. If you need help quitting, ask your health care provider. Keep all follow-up visits. This is important. Contact a health care provider if: You lose weight because you cannot swallow. You cough when you drink liquids. You cough up partially digested food. Get help right away if: You cannot swallow your saliva. You have shortness of breath, a fever, or both. Your voice is hoarse and you have trouble swallowing. These symptoms may represent a serious problem that is an emergency. Do not wait to see if the symptoms will go away. Get medical help right away. Call your local emergency services (911 in the [place] Do not drive yourself to the hospital. Summary Dysphagia is trouble swallowing. This condition occurs when solids and liquids stick in a person's throat on the way down to the stomach. You may cough or gag while trying to swallow. Dysphagia has many possible causes. Treatment for dysphagia depends on the cause of the condition. Keep all follow-up visits. This is important. This information is not intended to replace advice given to you by your health care provider. Make sure you discuss any questions you have with your healthcare provider. Document Revised: 05/24/2020 Document Reviewed: 05/24/2020 Elsevier Patient Education  [place].   Recombinant Zoster (Shingles) Vaccine: What You Need to Know 1. Why get vaccinated? Recombinant zoster (shingles) vaccine can prevent shingles. Shingles (also called herpes zoster, or just zoster) is a painful skin rash, usually with blisters. In addition to the rash, shingles can cause fever,  headache, chills, or upset stomach. More rarely, shingles can lead to pneumonia, hearingproblems, blindness, brain inflammation (encephalitis), or death. The most common complication of shingles is long-term nerve pain called postherpetic neuralgia (PHN). PHN occurs in the areas where the shingles rash was, even after the rash clears up. It can last for months or years after therash goes away. The pain from PHN can be severe and debilitating. About 10 to 18% of people who get shingles will experience PHN. The risk of PHN increases with age. An older adult with shingles is more likely to develop PHN and have longer lasting and more severe pain than a younger person withshingles. Shingles is caused by the varicella zoster virus, the same virus that causes chickenpox. After you have chickenpox, the virus stays in your body and can cause shingles later in life. Shingles cannot be passed from one person to another, but the virus that causes shingles can spread and cause chickenpox insomeone who had never had chickenpox or received chickenpox vaccine. 2. Recombinant shingles vaccine Recombinant shingles vaccine provides strong protection against shingles. Bypreventing shingles, recombinant shingles vaccine also protects against PHN. Recombinant shingles vaccine is the preferred vaccine for the prevention of shingles. However, a different vaccine, live shingles vaccine, may be used in somecircumstances. The recombinant shingles vaccine is recommended for adults 50 years and older without serious immune problems. It is given as a two-dose series. This vaccine is also recommended for people who have  already gotten another type of shingles vaccine, the live shingles vaccine. There is no live virus inthis vaccine. Shingles vaccine may be given at the same time as other vaccines. 3. Talk with your health care provider Tell your vaccine provider if the person getting the vaccine: Has had an allergic reaction after a  previous dose of recombinant shingles vaccine, or has any severe, life-threatening allergies. Is pregnant or breastfeeding. Is currently experiencing an episode of shingles. In some cases, your health care provider may decide to postpone shinglesvaccination to a future visit. People with minor illnesses, such as a cold, may be vaccinated. People who are moderately or severely ill should usually wait until they recover beforegetting recombinant shingles vaccine. Your health care provider can give you more information. 4. Risks of a vaccine reaction A sore arm with mild or moderate pain is very common after recombinant shingles vaccine, affecting about 80% of vaccinated people. Redness and swelling can also happen at the site of the injection. Tiredness, muscle pain, headache, shivering, fever, stomach pain, and nausea happen after vaccination in more than half of people who receive recombinant shingles vaccine. In clinical trials, about 1 out of 6 people who got recombinant zoster vaccine experienced side effects that prevented them from doing regular activities.Symptoms usually went away on their own in 2 to 3 days. You should still get the second dose of recombinant zoster vaccine even if youhad one of these reactions after the first dose. People sometimes faint after medical procedures, including vaccination. Tellyour provider if you feel dizzy or have vision changes or ringing in the ears. As with any medicine, there is a very remote chance of a vaccine causing asevere allergic reaction, other serious injury, or death. 5. What if there is a serious problem? An allergic reaction could occur after the vaccinated person leaves the clinic. If you see signs of a severe allergic reaction (hives, swelling of the face and throat, difficulty breathing, a fast heartbeat, dizziness, or weakness), call 9-1-1 and get the person to the nearest hospital. For other signs that concern you, call your health care  provider. Adverse reactions should be reported to the Vaccine Adverse Event Reporting System (VAERS). Your health care provider will usually file this report, or you can do it yourself. Visit the VAERS website at www.vaers.SamedayNews.es or call (530)831-1859. VAERS is only for reporting reactions, and VAERS staff do not give medical advice. 6. How can I learn more? Ask your health care provider. Call your local or state health department. Contact the Centers for Disease Control and Prevention (CDC): Call 301-786-1640 (1-800-CDC-INFO) or Visit CDC's website at http://hunter.com/ Vaccine Information Statement Recombinant Zoster Vaccine (08/16/2018) This information is not intended to replace advice given to you by your health care provider. Make sure you discuss any questions you have with your healthcare provider. Document Revised: 06/06/2020 Document Reviewed: 06/06/2020 Elsevier Patient Education  [place].

## 2021-05-24 ENCOUNTER — Encounter: Payer: Self-pay | Admitting: Physician Assistant

## 2021-06-05 ENCOUNTER — Ambulatory Visit: Payer: 59 | Admitting: Family Medicine

## 2021-06-11 ENCOUNTER — Other Ambulatory Visit (HOSPITAL_BASED_OUTPATIENT_CLINIC_OR_DEPARTMENT_OTHER): Payer: Self-pay

## 2021-06-11 ENCOUNTER — Other Ambulatory Visit: Payer: Self-pay | Admitting: Family Medicine

## 2021-06-11 MED ORDER — OZEMPIC (0.25 OR 0.5 MG/DOSE) 2 MG/1.5ML ~~LOC~~ SOPN
0.2500 mg | PEN_INJECTOR | SUBCUTANEOUS | 0 refills | Status: DC
Start: 2021-06-11 — End: 2021-08-28
  Filled 2021-06-11: qty 1.5, 56d supply, fill #0

## 2021-06-12 ENCOUNTER — Ambulatory Visit: Payer: 59 | Attending: Internal Medicine

## 2021-06-12 DIAGNOSIS — Z86010 Personal history of colonic polyps: Secondary | ICD-10-CM | POA: Diagnosis not present

## 2021-06-12 DIAGNOSIS — Z23 Encounter for immunization: Secondary | ICD-10-CM

## 2021-06-12 DIAGNOSIS — Z79899 Other long term (current) drug therapy: Secondary | ICD-10-CM | POA: Diagnosis not present

## 2021-06-12 DIAGNOSIS — Z9884 Bariatric surgery status: Secondary | ICD-10-CM | POA: Diagnosis not present

## 2021-06-12 DIAGNOSIS — R1319 Other dysphagia: Secondary | ICD-10-CM | POA: Diagnosis not present

## 2021-06-12 NOTE — Progress Notes (Signed)
   Covid-19 Vaccination Clinic  Name:  Rose Long    MRN: [id number] DOB: 07/04/1961  06/12/2021  Rose Long was observed post Covid-19 immunization for 15 minutes without incident. She was provided with Vaccine Information Sheet and instruction to access the V-Safe system.   Rose Long was instructed to call 911 with any severe reactions post vaccine: Difficulty breathing  Swelling of face and throat  A fast heartbeat  A bad rash all over body  Dizziness and weakness   Immunizations Administered     Name Date Dose VIS Date Route   PFIZER Comrnaty(Gray TOP) Covid-19 Vaccine 06/12/2021 10:04 AM 0.3 mL 09/25/2020 Intramuscular   Manufacturer: [place]   Lot: [id number]   [place]: [phone]

## 2021-06-29 ENCOUNTER — Other Ambulatory Visit (HOSPITAL_BASED_OUTPATIENT_CLINIC_OR_DEPARTMENT_OTHER): Payer: Self-pay

## 2021-06-29 MED ORDER — COVID-19 MRNA VAC-TRIS(PFIZER) 30 MCG/0.3ML IM SUSP
INTRAMUSCULAR | 0 refills | Status: DC
  Filled 2021-06-29: qty 0.3, 1d supply, fill #0

## 2021-07-03 ENCOUNTER — Other Ambulatory Visit (HOSPITAL_BASED_OUTPATIENT_CLINIC_OR_DEPARTMENT_OTHER): Payer: Self-pay

## 2021-07-03 ENCOUNTER — Telehealth: Payer: Self-pay | Admitting: Family Medicine

## 2021-07-03 DIAGNOSIS — Z298 Encounter for other specified prophylactic measures: Secondary | ICD-10-CM

## 2021-07-03 DIAGNOSIS — Z7184 Encounter for health counseling related to travel: Secondary | ICD-10-CM

## 2021-07-03 MED ORDER — ATOVAQUONE-PROGUANIL HCL 250-100 MG PO TABS
1.0000 | ORAL_TABLET | Freq: Every day | ORAL | 0 refills | Status: DC
  Filled 2021-07-03: qty 20, 20d supply, fill #0

## 2021-07-03 NOTE — Telephone Encounter (Signed)
Patient traveling to [place] for missions trip.  Will need malaria medications.  Will be out of the country from September 24 October 1 approximately 1 week.  Meds ordered this encounter  Medications   atovaquone-proguanil (MALARONE) 250-100 MG TABS tablet    Sig: Take 1 tablet by mouth daily. Start 1 day before travel and D/C 7 days after return home    Dispense:  20 tablet    Refill:  0

## 2021-07-06 ENCOUNTER — Other Ambulatory Visit (HOSPITAL_BASED_OUTPATIENT_CLINIC_OR_DEPARTMENT_OTHER): Payer: Self-pay

## 2021-07-10 DIAGNOSIS — R1310 Dysphagia, unspecified: Secondary | ICD-10-CM | POA: Diagnosis not present

## 2021-08-28 ENCOUNTER — Other Ambulatory Visit (HOSPITAL_BASED_OUTPATIENT_CLINIC_OR_DEPARTMENT_OTHER): Payer: Self-pay

## 2021-08-28 ENCOUNTER — Other Ambulatory Visit: Payer: Self-pay

## 2021-08-28 ENCOUNTER — Encounter: Payer: Self-pay | Admitting: Family Medicine

## 2021-08-28 ENCOUNTER — Other Ambulatory Visit: Payer: Self-pay | Admitting: Family Medicine

## 2021-08-28 DIAGNOSIS — F902 Attention-deficit hyperactivity disorder, combined type: Secondary | ICD-10-CM

## 2021-08-28 DIAGNOSIS — K208 Other esophagitis without bleeding: Secondary | ICD-10-CM | POA: Diagnosis not present

## 2021-08-28 DIAGNOSIS — K222 Esophageal obstruction: Secondary | ICD-10-CM | POA: Diagnosis not present

## 2021-08-28 DIAGNOSIS — R1319 Other dysphagia: Secondary | ICD-10-CM | POA: Diagnosis not present

## 2021-08-28 DIAGNOSIS — K449 Diaphragmatic hernia without obstruction or gangrene: Secondary | ICD-10-CM | POA: Diagnosis not present

## 2021-08-28 DIAGNOSIS — F411 Generalized anxiety disorder: Secondary | ICD-10-CM

## 2021-08-28 MED ORDER — OMEPRAZOLE 40 MG PO CPDR
DELAYED_RELEASE_CAPSULE | ORAL | 2 refills | Status: DC
  Filled 2021-08-28: qty 60, 30d supply, fill #0
  Filled 2021-10-05: qty 60, 30d supply, fill #1
  Filled 2021-11-16: qty 60, 30d supply, fill #2

## 2021-08-31 ENCOUNTER — Other Ambulatory Visit: Payer: Self-pay

## 2021-08-31 MED ORDER — SERTRALINE HCL 50 MG PO TABS
50.0000 mg | ORAL_TABLET | Freq: Every day | ORAL | 1 refills | Status: DC
  Filled 2021-08-31 – 2022-01-07 (×3): qty 90, 90d supply, fill #0
  Filled 2022-05-04: qty 90, 90d supply, fill #1

## 2021-08-31 MED ORDER — OZEMPIC (0.25 OR 0.5 MG/DOSE) 2 MG/1.5ML ~~LOC~~ SOPN
0.5000 mg | PEN_INJECTOR | SUBCUTANEOUS | 1 refills | Status: DC
  Filled 2021-08-31: qty 1.5, 28d supply, fill #0

## 2021-08-31 MED ORDER — AMPHETAMINE-DEXTROAMPHETAMINE 10 MG PO TABS
10.0000 mg | ORAL_TABLET | Freq: Two times a day (BID) | ORAL | 0 refills | Status: DC
  Filled 2021-08-31: qty 60, 30d supply, fill #0

## 2021-10-05 ENCOUNTER — Other Ambulatory Visit (HOSPITAL_BASED_OUTPATIENT_CLINIC_OR_DEPARTMENT_OTHER): Payer: Self-pay

## 2021-11-06 ENCOUNTER — Encounter: Payer: Self-pay | Admitting: Family Medicine

## 2021-11-06 ENCOUNTER — Ambulatory Visit (INDEPENDENT_AMBULATORY_CARE_PROVIDER_SITE_OTHER): Payer: 59 | Admitting: Family Medicine

## 2021-11-06 ENCOUNTER — Other Ambulatory Visit: Payer: Self-pay

## 2021-11-06 ENCOUNTER — Other Ambulatory Visit (HOSPITAL_BASED_OUTPATIENT_CLINIC_OR_DEPARTMENT_OTHER): Payer: Self-pay

## 2021-11-06 DIAGNOSIS — R051 Acute cough: Secondary | ICD-10-CM

## 2021-11-06 DIAGNOSIS — J069 Acute upper respiratory infection, unspecified: Secondary | ICD-10-CM | POA: Diagnosis not present

## 2021-11-06 DIAGNOSIS — Z7689 Persons encountering health services in other specified circumstances: Secondary | ICD-10-CM | POA: Diagnosis not present

## 2021-11-06 MED ORDER — OZEMPIC (0.25 OR 0.5 MG/DOSE) 2 MG/1.5ML ~~LOC~~ SOPN
0.2500 mg | PEN_INJECTOR | SUBCUTANEOUS | 1 refills | Status: DC
  Filled 2021-11-06: qty 1.5, 56d supply, fill #0

## 2021-11-06 MED ORDER — HYDROCODONE BIT-HOMATROP MBR 5-1.5 MG/5ML PO SOLN
5.0000 mL | Freq: Three times a day (TID) | ORAL | 0 refills | Status: DC | PRN
  Filled 2021-11-06: qty 120, 8d supply, fill #0

## 2021-11-06 MED ORDER — HYDROCODONE BIT-HOMATROP MBR 5-1.5 MG/5ML PO SOLN
5.0000 mL | Freq: Every evening | ORAL | 0 refills | Status: DC | PRN
  Filled 2021-11-06: qty 60, 6d supply, fill #0

## 2021-11-06 MED ORDER — BENZONATATE 200 MG PO CAPS
200.0000 mg | ORAL_CAPSULE | Freq: Two times a day (BID) | ORAL | 0 refills | Status: DC | PRN
  Filled 2021-11-06: qty 20, 10d supply, fill #0

## 2021-11-06 NOTE — Progress Notes (Signed)
Acute Office Visit  Subjective:    Patient ID: Rose Long, female    DOB: 07-21-1961, 61 y.o.   MRN: [id number]  Chief Complaint  Patient presents with   Cough    HPI Patient is in today for cough.  She says her symptoms started about 5 days ago with mild sore throat and some sinus drainage.  It now feels like it is moved a little bit more into her chest and her sinuses feel little better.  No GI symptoms.  Her left ear does feel a little clogged and she is had a little bit of tenderness on the left side of her neck.  The overall sore throat has been a little better.  She is getting up a little bit of yellow-colored mucus.  No shortness of breath.  Not really using any over-the-counter medicines  In regards to weight management she has signed up to join the [place] program which is an aftercare health/maintenance program after having had bariatric surgery.  She would also like to consider trying the Ozempic again.  She is motivated to make some changes and has some goals already in mind.  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   Procedure: TOTAL KNEE ARTHROPLASTY;  Surgeon: Melrose Nakayama, MD;  Location: [facility];  Service: Orthopedics;  Laterality: Left;   under arm skin removed     uterine ablation  2012    Family History  Problem Relation Age of Onset   Hypertension Mother    Bladder  Cancer Mother    Hyperlipidemia Mother    Migraines Mother    Obesity Mother    Rheum arthritis Mother    Hypertension Father    CVA Father    Heart failure Father    Arthritis Father    Hyperlipidemia Father    Kidney failure Father    Heart disease Maternal Grandfather    Lung cancer Paternal Grandmother        lung   Alcohol abuse Paternal Uncle    Diabetes Cousin    Arthritis Sister    Hypertension Brother    Obesity Brother    Rheum arthritis Brother     Social History   Socioeconomic History   Marital status: Married    Spouse name: Not on file   Number of children: 0   Years of education: Masters   Highest education level: Not on file  Occupational History   Occupation: Therapist    Comment: [employer]  Tobacco Use   Smoking status: Never   Smokeless tobacco: Never  Vaping Use   Vaping Use: Never used  Substance and Sexual Activity   Alcohol use: Yes    Alcohol/week: 1.0 standard drink    Types: 1  Standard drinks or equivalent per week    Comment: socially   Drug use: No   Sexual activity: Yes    Birth control/protection: None  Other Topics Concern   Not on file  Social History Narrative   Parents moved into her home on June 2013. Lives with her spouse.  2 caffeine drinks per day.  Takes a 30 minute walk 7 days a week. Left-handed   Social Determinants of Radio broadcast assistant Strain: Not on file  Food Insecurity: Not on file  Transportation Needs: Not on file  Physical Activity: Not on file  Stress: Not on file  Social Connections: Not on file  Intimate Partner Violence: Not on file    Outpatient Medications Prior to Visit  Medication Sig Dispense Refill   acetaminophen (TYLENOL) 650 MG CR tablet Take 1 tablet (650 mg total) by mouth every 8 (eight) hours as needed for pain. 90 tablet 3   amphetamine-dextroamphetamine (ADDERALL) 10 MG tablet Take 1 tablet (10 mg total) by mouth 2 (two) times daily. 60 tablet 0    amphetamine-dextroamphetamine (ADDERALL) 10 MG tablet Take 1 tablet (10 mg total) by mouth 2 (two) times daily. 60 tablet 0   atovaquone-proguanil (MALARONE) 250-100 MG TABS tablet Take 1 tablet by mouth daily. Start 1 day before travel and D/C 7 days after return home 20 tablet 0   calcium citrate-vitamin D 200-200 MG-UNIT TABS Take 1 tablet by mouth daily.     Cholecalciferol 1.25 MG (50000 UT) capsule Take 1 capsule (50,000 Units total) by mouth once a week. 12 capsule 3   COVID-19 mRNA Vac-TriS, Pfizer, SUSP injection Inject into the muscle. 0.3 mL 0   ferrous sulfate 325 (65 FE) MG tablet Take 1 tablet (325 mg total) by mouth daily with breakfast. 90 tablet 3   MAGNESIUM CITRATE PO Take 1 tablet by mouth 2 (two) times daily.      Multiple Vitamin (MULTIVITAMIN) tablet Take 1 tablet by mouth daily.     omeprazole (PRILOSEC) 40 MG capsule Take one capsule (40 mg dose) by mouth 2 (two) times daily. 60 capsule 2   pantoprazole (PROTONIX) 40 MG tablet Take 1 tablet (40 mg total) by mouth daily. 30 tablet 0   sertraline (ZOLOFT) 50 MG tablet Take 1 tablet (50 mg total) by mouth daily. 90 tablet 1   SUMAtriptan (IMITREX) 100 MG tablet TAKE 1 TABLET BY MOUTH EVERY 2 HOURS AS NEEDED FOR MIGRAINE. MAY REPEAT IN 2 HOURS IF HEADACHE PERSISTS OR RECURS. MAX OF 2 TABLETS IN ONE DAY 10 tablet 5   Semaglutide,0.25 or 0.5MG /DOS, (OZEMPIC, 0.25 OR 0.5 MG/DOSE,) 2 MG/1.5ML SOPN Inject 0.5 mg into the skin once a week. 1.5 mL 1   No facility-administered medications prior to visit.    Allergies  Allergen Reactions   Sulfa Antibiotics Anaphylaxis   Sulfamethoxazole-Trimethoprim Anaphylaxis, Swelling and Rash    Redness and swelling   Bee Venom    Trazodone And Nefazodone     headache    Review of Systems     Objective:    Physical Exam Constitutional:      Appearance: She is well-developed.  HENT:     Head: Normocephalic and atraumatic.     Right Ear: External ear normal.     Left Ear: External  ear normal.     Nose: Nose normal.  Eyes:     Conjunctiva/sclera: Conjunctivae normal.     Pupils: Pupils are equal, round, and reactive to light.  Neck:  Thyroid: No thyromegaly.  Cardiovascular:     Rate and Rhythm: Normal rate and regular rhythm.     Heart sounds: Normal heart sounds.  Pulmonary:     Effort: Pulmonary effort is normal.     Breath sounds: Normal breath sounds. No wheezing.  Musculoskeletal:     Cervical back: Neck supple.  Lymphadenopathy:     Cervical: No cervical adenopathy.  Skin:    General: Skin is warm and dry.  Neurological:     Mental Status: She is alert and oriented to person, place, and time.    There were no vitals taken for this visit. Wt Readings from Last 3 Encounters:  05/22/21 222 lb (100.7 kg)  02/19/21 221 lb (100.2 kg)  01/29/21 217 lb (98.4 kg)    Health Maintenance Due  Topic Date Due   Zoster Vaccines- Shingrix (2 of 2) 07/17/2021   COVID-19 Vaccine (5 - Booster for Pfizer series) 08/07/2021    There are no preventive care reminders to display for this patient.   Lab Results  Component Value Date   TSH 1.62 06/26/2020   Lab Results  Component Value Date   WBC 6.6 06/26/2020   HGB 13.2 06/26/2020   HCT 41.1 06/26/2020   MCV 83.5 06/26/2020   PLT 355 06/26/2020   Lab Results  Component Value Date   NA 140 03/05/2021   K 5.5 (H) 03/05/2021   CO2 29 03/05/2021   GLUCOSE 90 03/05/2021   BUN 12 03/05/2021   CREATININE 0.70 03/05/2021   BILITOT 0.3 03/05/2021   ALKPHOS 70 06/02/2015   AST 18 03/05/2021   ALT 14 03/05/2021   PROT 7.1 03/05/2021   ALBUMIN 3.6 06/02/2015   CALCIUM 10.0 03/05/2021   ANIONGAP 9 09/19/2015   Lab Results  Component Value Date   CHOL 224 (H) 03/05/2021   Lab Results  Component Value Date   HDL 64 03/05/2021   Lab Results  Component Value Date   LDLCALC 141 (H) 03/05/2021   Lab Results  Component Value Date   TRIG 84 03/05/2021   Lab Results  Component Value Date    CHOLHDL 3.5 03/05/2021   No results found for: HGBA1C     Assessment & Plan:   Problem List Items Addressed This Visit       Other   Encounter for weight management   Relevant Medications   Semaglutide,0.25 or 0.5MG /DOS, (OZEMPIC, 0.25 OR 0.5 MG/DOSE,) 2 MG/1.5ML SOPN   Other Visit Diagnoses     Viral upper respiratory tract infection    -  Primary   Acute cough       Relevant Medications   benzonatate (TESSALON) 200 MG capsule   HYDROcodone bit-homatropine (HYCODAN) 5-1.5 MG/5ML syrup      Upper respiratory infection-discussed that at this point is most consistent with a viral illness so we will really work on trying to help control the cough which is keeping her awake at night.  We will try a small amount of hydrocodone cough syrup.  Meds ordered this encounter  Medications   benzonatate (TESSALON) 200 MG capsule    Sig: Take 1 capsule (200 mg total) by mouth 2 (two) times daily as needed for cough.    Dispense:  20 capsule    Refill:  0   DISCONTD: HYDROcodone bit-homatropine (HYCODAN) 5-1.5 MG/5ML syrup    Sig: Take 5 mLs by mouth every 8 (eight) hours as needed for cough.    Dispense:  120 mL  Refill:  0   Semaglutide,0.25 or 0.5MG /DOS, (OZEMPIC, 0.25 OR 0.5 MG/DOSE,) 2 MG/1.5ML SOPN    Sig: Inject 0.25 mg into the skin once a week.    Dispense:  1.5 mL    Refill:  1   HYDROcodone bit-homatropine (HYCODAN) 5-1.5 MG/5ML syrup    Sig: Take 5-10 mLs by mouth at bedtime as needed for cough.    Dispense:  60 mL    Refill:  0    Pls cancel order for 120 ml dose     Beatrice Lecher, MD

## 2021-11-16 ENCOUNTER — Encounter: Payer: Self-pay | Admitting: Family Medicine

## 2021-11-16 ENCOUNTER — Other Ambulatory Visit (HOSPITAL_BASED_OUTPATIENT_CLINIC_OR_DEPARTMENT_OTHER): Payer: Self-pay

## 2021-11-16 NOTE — Telephone Encounter (Signed)
Form completed and placed in [name]'s box

## 2021-11-18 NOTE — Telephone Encounter (Signed)
Form faxed and placed in scan  

## 2021-11-27 ENCOUNTER — Encounter: Payer: Self-pay | Admitting: Family Medicine

## 2021-11-27 ENCOUNTER — Other Ambulatory Visit (HOSPITAL_BASED_OUTPATIENT_CLINIC_OR_DEPARTMENT_OTHER): Payer: Self-pay

## 2021-11-27 ENCOUNTER — Ambulatory Visit (INDEPENDENT_AMBULATORY_CARE_PROVIDER_SITE_OTHER): Payer: 59 | Admitting: Family Medicine

## 2021-11-27 ENCOUNTER — Other Ambulatory Visit: Payer: Self-pay

## 2021-11-27 VITALS — BP 138/77 | HR 78 | Resp 16 | Ht 64.0 in | Wt 225.4 lb

## 2021-11-27 DIAGNOSIS — E559 Vitamin D deficiency, unspecified: Secondary | ICD-10-CM | POA: Diagnosis not present

## 2021-11-27 DIAGNOSIS — G43919 Migraine, unspecified, intractable, without status migrainosus: Secondary | ICD-10-CM | POA: Diagnosis not present

## 2021-11-27 DIAGNOSIS — Z7689 Persons encountering health services in other specified circumstances: Secondary | ICD-10-CM | POA: Diagnosis not present

## 2021-11-27 DIAGNOSIS — Z298 Encounter for other specified prophylactic measures: Secondary | ICD-10-CM

## 2021-11-27 DIAGNOSIS — Z7184 Encounter for health counseling related to travel: Secondary | ICD-10-CM

## 2021-11-27 DIAGNOSIS — Z23 Encounter for immunization: Secondary | ICD-10-CM | POA: Diagnosis not present

## 2021-11-27 MED ORDER — SUMATRIPTAN SUCCINATE 100 MG PO TABS
ORAL_TABLET | ORAL | 5 refills | Status: DC
  Filled 2021-11-27: qty 10, 30d supply, fill #0
  Filled 2022-01-07: qty 9, 30d supply, fill #0
  Filled 2022-02-05: qty 9, 30d supply, fill #1
  Filled 2022-03-29: qty 9, 30d supply, fill #2
  Filled 2022-05-04: qty 9, 30d supply, fill #3
  Filled 2022-06-03: qty 9, 30d supply, fill #4
  Filled 2022-07-19: qty 9, 30d supply, fill #5
  Filled 2022-08-15 – 2022-09-19 (×3): qty 9, 30d supply, fill #6

## 2021-11-27 MED ORDER — OZEMPIC (0.25 OR 0.5 MG/DOSE) 2 MG/1.5ML ~~LOC~~ SOPN
0.5000 mg | PEN_INJECTOR | SUBCUTANEOUS | 1 refills | Status: DC
  Filled 2021-11-27 – 2022-01-07 (×2): qty 1.5, 28d supply, fill #0
  Filled 2022-02-05: qty 1.5, 28d supply, fill #1

## 2021-11-27 MED ORDER — CHOLECALCIFEROL 1.25 MG (50000 UT) PO CAPS
50000.0000 [IU] | ORAL_CAPSULE | ORAL | 3 refills | Status: DC
  Filled 2021-11-27: qty 12, 84d supply, fill #0
  Filled 2022-01-07: qty 12, 84d supply, fill #1
  Filled 2022-07-19: qty 12, 84d supply, fill #2
  Filled 2022-10-16 – 2022-10-22 (×2): qty 12, 84d supply, fill #3

## 2021-11-27 MED ORDER — ATOVAQUONE-PROGUANIL HCL 250-100 MG PO TABS: 1.0000 | ORAL_TABLET | Freq: Every day | ORAL | 0 refills | Status: DC

## 2021-11-27 NOTE — Assessment & Plan Note (Signed)
Traveling to [place] in the summer.  Hepatitis a and B vaccines are complete. Given second shingles vaccine today. Commend getting COVID booster this spring before travel.

## 2021-11-27 NOTE — Progress Notes (Signed)
Established Patient Office Visit  Subjective:  Patient ID: Rose Long, female    DOB: 08/18/1961  Age: 61 y.o. MRN: [id number]  CC:  Chief Complaint  Patient presents with   Weight Management     Patient currently taking 0.25mg  of Ozempic. Patient states it is working well for her.     HPI Rose Long presents for Weight management. She started ozempic on 11/06/2020.  She is actually been doing well on the medication.  She does feel like its been helpful in curbing appetite.  She has been tolerating it well and has not had any problems or complications she has been on it for about 2 weeks at this point.  She did start the exercise belt program last Wednesday and it will be for 48 sessions.  She is currently trying to work on increasing her vegetable intake and decreasing her beef intake and she has been slowly weaning off of sugar and says should be sugar-free by this weekend.  Her goal is to really increase her endurance for walking for her missions trip this summer.  He is feeling much better after her recent illness.  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   Procedure: TOTAL KNEE ARTHROPLASTY;  Surgeon: Melrose Nakayama, MD;  Location: [facility];  Service: Orthopedics;  Laterality: Left;   under arm skin removed     uterine ablation  2012    Family History  Problem Relation Age  of Onset   Hypertension Mother    Bladder Cancer Mother    Hyperlipidemia Mother    Migraines Mother    Obesity Mother    Rheum arthritis Mother    Hypertension Father    CVA Father    Heart failure Father    Arthritis Father    Hyperlipidemia Father    Kidney failure Father    Heart disease Maternal Grandfather    Lung cancer Paternal Grandmother        lung   Alcohol abuse Paternal Uncle    Diabetes Cousin    Arthritis Sister    Hypertension Brother    Obesity Brother    Rheum arthritis Brother     Social History   Socioeconomic History   Marital status: Married    Spouse name: Not on file   Number of children: 0   Years of education: Masters   Highest education level: Not on file  Occupational History   Occupation: Therapist    Comment: [employer]  Tobacco Use   Smoking status: Never   Smokeless tobacco: Never  Vaping Use   Vaping Use: Never used  Substance and Sexual Activity   Alcohol use: Yes    Alcohol/week: 1.0 standard drink    Types: 1  Standard drinks or equivalent per week    Comment: socially   Drug use: No   Sexual activity: Yes    Birth control/protection: None  Other Topics Concern   Not on file  Social History Narrative   Parents moved into her home on June 2013. Lives with her spouse.  2 caffeine drinks per day.  Takes a 30 minute walk 7 days a week. Left-handed   Social Determinants of Radio broadcast assistant Strain: Not on file  Food Insecurity: Not on file  Transportation Needs: Not on file  Physical Activity: Not on file  Stress: Not on file  Social Connections: Not on file  Intimate Partner Violence: Not on file    Outpatient Medications Prior to Visit  Medication Sig Dispense Refill   acetaminophen (TYLENOL) 650 MG CR tablet Take 1 tablet (650 mg total) by mouth every 8 (eight) hours as needed for pain. 90 tablet 3   amphetamine-dextroamphetamine (ADDERALL) 10 MG tablet Take 1 tablet (10 mg total) by mouth 2 (two) times  daily. 60 tablet 0   amphetamine-dextroamphetamine (ADDERALL) 10 MG tablet Take 1 tablet (10 mg total) by mouth 2 (two) times daily. 60 tablet 0   calcium citrate-vitamin D 200-200 MG-UNIT TABS Take 1 tablet by mouth daily.     ferrous sulfate 325 (65 FE) MG tablet Take 1 tablet (325 mg total) by mouth daily with breakfast. 90 tablet 3   MAGNESIUM CITRATE PO Take 1 tablet by mouth 2 (two) times daily.      Multiple Vitamin (MULTIVITAMIN) tablet Take 1 tablet by mouth daily.     omeprazole (PRILOSEC) 40 MG capsule Take one capsule (40 mg dose) by mouth 2 (two) times daily. 60 capsule 2   pantoprazole (PROTONIX) 40 MG tablet Take 1 tablet (40 mg total) by mouth daily. 30 tablet 0   sertraline (ZOLOFT) 50 MG tablet Take 1 tablet (50 mg total) by mouth daily. 90 tablet 1   atovaquone-proguanil (MALARONE) 250-100 MG TABS tablet Take 1 tablet by mouth daily. Start 1 day before travel and D/C 7 days after return home 20 tablet 0   Cholecalciferol 1.25 MG (50000 UT) capsule Take 1 capsule (50,000 Units total) by mouth once a week. 12 capsule 3   Semaglutide,0.25 or 0.5MG /DOS, (OZEMPIC, 0.25 OR 0.5 MG/DOSE,) 2 MG/1.5ML SOPN Inject 0.25 mg into the skin once a week. 1.5 mL 1   SUMAtriptan (IMITREX) 100 MG tablet TAKE 1 TABLET BY MOUTH EVERY 2 HOURS AS NEEDED FOR MIGRAINE. MAY REPEAT IN 2 HOURS IF HEADACHE PERSISTS OR RECURS. MAX OF 2 TABLETS IN ONE DAY 10 tablet 5   benzonatate (TESSALON) 200 MG capsule Take 1 capsule (200 mg total) by mouth 2 (two) times daily as needed for cough. 20 capsule 0   COVID-19 mRNA Vac-TriS, Pfizer, SUSP injection Inject into the muscle. 0.3 mL 0   HYDROcodone bit-homatropine (HYCODAN) 5-1.5 MG/5ML syrup Take 5-10 mLs by mouth at bedtime as needed for cough. 60 mL 0   No facility-administered medications prior to visit.    Allergies  Allergen Reactions   Sulfa Antibiotics Anaphylaxis   Sulfamethoxazole-Trimethoprim Anaphylaxis, Swelling and Rash    Redness and swelling    Bee Venom    Trazodone And Nefazodone     headache    ROS Review of Systems    Objective:    Physical Exam Constitutional:      Appearance: Normal appearance. She is well-developed.  HENT:     Head: Normocephalic and  atraumatic.  Cardiovascular:     Rate and Rhythm: Normal rate and regular rhythm.     Heart sounds: Normal heart sounds.  Pulmonary:     Effort: Pulmonary effort is normal.     Breath sounds: Normal breath sounds.  Skin:    General: Skin is warm and dry.  Neurological:     Mental Status: She is alert and oriented to person, place, and time.  Psychiatric:        Behavior: Behavior normal.    BP 138/77 (BP Location: Left Arm)    Pulse 78    Resp 16    Ht 5\' 4"  (1.626 m)    Wt 225 lb 6.4 oz (102.2 kg)    SpO2 98%    BMI 38.69 kg/m  Wt Readings from Last 3 Encounters:  11/27/21 225 lb 6.4 oz (102.2 kg)  05/22/21 222 lb (100.7 kg)  02/19/21 221 lb (100.2 kg)     Health Maintenance Due  Topic Date Due   COVID-19 Vaccine (5 - Booster for Pfizer series) 08/07/2021    There are no preventive care reminders to display for this patient.  Lab Results  Component Value Date   TSH 1.62 06/26/2020   Lab Results  Component Value Date   WBC 6.6 06/26/2020   HGB 13.2 06/26/2020   HCT 41.1 06/26/2020   MCV 83.5 06/26/2020   PLT 355 06/26/2020   Lab Results  Component Value Date   NA 140 03/05/2021   K 5.5 (H) 03/05/2021   CO2 29 03/05/2021   GLUCOSE 90 03/05/2021   BUN 12 03/05/2021   CREATININE 0.70 03/05/2021   BILITOT 0.3 03/05/2021   ALKPHOS 70 06/02/2015   AST 18 03/05/2021   ALT 14 03/05/2021   PROT 7.1 03/05/2021   ALBUMIN 3.6 06/02/2015   CALCIUM 10.0 03/05/2021   ANIONGAP 9 09/19/2015   Lab Results  Component Value Date   CHOL 224 (H) 03/05/2021   Lab Results  Component Value Date   HDL 64 03/05/2021   Lab Results  Component Value Date   LDLCALC 141 (H) 03/05/2021   Lab Results  Component Value Date   TRIG 84 03/05/2021    Lab Results  Component Value Date   CHOLHDL 3.5 03/05/2021   No results found for: HGBA1C    Assessment & Plan:   Problem List Items Addressed This Visit       Cardiovascular and Mediastinum   Migraine    Refill on Imitrex.      Relevant Medications   SUMAtriptan (IMITREX) 100 MG tablet     Other   Vitamin D deficiency    Refill on vitamin D.      Relevant Medications   Cholecalciferol 1.25 MG (50000 UT) capsule   Encounter for weight management - Primary    Visit #: 2 Starting KZLDJT:701 lbs  Current weight: 225 lbs  Previous weight: Change in weight: Goal weight: 180 lbs Dietary goals:inc veggies, dec beef Exercise goals: started the BELT program last week. It is ill be 48 session.   Medication: ozempic.  Plan to increase to 0.5 mg in about 2 weeks.  I would like to see her back in about 6 weeks.  And then we can adjust her dose if she still doing well at that time. Follow-up and referrals:  6 weeks.        Relevant Medications   Semaglutide,0.25 or 0.5MG /DOS, (OZEMPIC, 0.25 OR 0.5 MG/DOSE,) 2 MG/1.5ML SOPN   Counseling for travel  Traveling to [place] in the summer.  Hepatitis a and B vaccines are complete. Given second shingles vaccine today. Commend getting COVID booster this spring before travel.      Relevant Medications   atovaquone-proguanil (MALARONE) 250-100 MG TABS tablet   Other Visit Diagnoses     Immunization due       Relevant Orders   Varicella-zoster vaccine IM (Shingrix) (Completed)   Need for malaria prophylaxis       Relevant Medications   atovaquone-proguanil (MALARONE) 250-100 MG TABS tablet       Meds ordered this encounter  Medications   SUMAtriptan (IMITREX) 100 MG tablet    Sig: TAKE 1 TABLET BY MOUTH EVERY 2 HOURS AS NEEDED FOR MIGRAINE. MAY REPEAT IN 2 HOURS IF HEADACHE PERSISTS OR RECURS. MAX OF 2 TABLETS IN ONE DAY    Dispense:  10 tablet    Refill:  5   Cholecalciferol 1.25 MG (50000 UT) capsule    Sig:  Take 1 capsule (50,000 Units total) by mouth once a week.    Dispense:  12 capsule    Refill:  3   Semaglutide,0.25 or 0.5MG /DOS, (OZEMPIC, 0.25 OR 0.5 MG/DOSE,) 2 MG/1.5ML SOPN    Sig: Inject 0.5 mg into the skin once a week.    Dispense:  1.5 mL    Refill:  1   atovaquone-proguanil (MALARONE) 250-100 MG TABS tablet    Sig: Take 1 tablet by mouth daily. Start 1 day before travel and D/C 7 days after return home    Dispense:  20 tablet    Refill:  0    Follow-up: Return in about 6 weeks (around 01/08/2022) for Med management .    Beatrice Lecher, MD

## 2021-11-27 NOTE — Assessment & Plan Note (Signed)
Refill on vitamin D.

## 2021-11-27 NOTE — Assessment & Plan Note (Addendum)
Visit #: 2 Starting PYKDXI:338 lbs  Current weight: 225 lbs  Previous weight: Change in weight: Goal weight: 180 lbs Dietary goals:inc veggies, dec beef Exercise goals: started the BELT program last week. It is ill be 48 session.   Medication: ozempic.  Plan to increase to 0.5 mg in about 2 weeks.  I would like to see her back in about 6 weeks.  And then we can adjust her dose if she still doing well at that time. Follow-up and referrals:  6 weeks.

## 2021-11-27 NOTE — Assessment & Plan Note (Signed)
Refill on Imitrex.

## 2021-12-25 DIAGNOSIS — K219 Gastro-esophageal reflux disease without esophagitis: Secondary | ICD-10-CM | POA: Diagnosis not present

## 2021-12-25 DIAGNOSIS — K208 Other esophagitis without bleeding: Secondary | ICD-10-CM | POA: Diagnosis not present

## 2021-12-25 DIAGNOSIS — K449 Diaphragmatic hernia without obstruction or gangrene: Secondary | ICD-10-CM | POA: Diagnosis not present

## 2022-01-07 ENCOUNTER — Ambulatory Visit (INDEPENDENT_AMBULATORY_CARE_PROVIDER_SITE_OTHER): Payer: 59 | Admitting: Family Medicine

## 2022-01-07 ENCOUNTER — Encounter: Payer: Self-pay | Admitting: Family Medicine

## 2022-01-07 ENCOUNTER — Other Ambulatory Visit (HOSPITAL_BASED_OUTPATIENT_CLINIC_OR_DEPARTMENT_OTHER): Payer: Self-pay

## 2022-01-07 ENCOUNTER — Telehealth: Payer: Self-pay

## 2022-01-07 ENCOUNTER — Other Ambulatory Visit: Payer: Self-pay | Admitting: Family Medicine

## 2022-01-07 ENCOUNTER — Other Ambulatory Visit: Payer: Self-pay

## 2022-01-07 VITALS — BP 141/81 | HR 97 | Resp 16 | Ht 64.0 in | Wt 222.1 lb

## 2022-01-07 DIAGNOSIS — F411 Generalized anxiety disorder: Secondary | ICD-10-CM

## 2022-01-07 DIAGNOSIS — Z7689 Persons encountering health services in other specified circumstances: Secondary | ICD-10-CM

## 2022-01-07 DIAGNOSIS — G43919 Migraine, unspecified, intractable, without status migrainosus: Secondary | ICD-10-CM

## 2022-01-07 DIAGNOSIS — Z7184 Encounter for health counseling related to travel: Secondary | ICD-10-CM

## 2022-01-07 DIAGNOSIS — R41840 Attention and concentration deficit: Secondary | ICD-10-CM

## 2022-01-07 DIAGNOSIS — Z298 Encounter for other specified prophylactic measures: Secondary | ICD-10-CM

## 2022-01-07 MED ORDER — NURTEC 75 MG PO TBDP
75.0000 mg | ORAL_TABLET | ORAL | 1 refills | Status: DC
  Filled 2022-01-07: qty 16, 30d supply, fill #0
  Filled 2022-02-12: qty 16, 30d supply, fill #1
  Filled 2022-03-29: qty 16, 30d supply, fill #2
  Filled 2022-05-04 – 2022-06-03 (×2): qty 16, 30d supply, fill #3
  Filled 2022-07-19: qty 16, 30d supply, fill #4
  Filled 2022-08-15 – 2022-09-19 (×3): qty 16, 30d supply, fill #5

## 2022-01-07 MED ORDER — OMEPRAZOLE 40 MG PO CPDR
40.0000 mg | DELAYED_RELEASE_CAPSULE | Freq: Two times a day (BID) | ORAL | 2 refills | Status: DC
  Filled 2022-01-07: qty 60, 30d supply, fill #0
  Filled 2022-02-05: qty 60, 30d supply, fill #1
  Filled 2022-03-29: qty 60, 30d supply, fill #2

## 2022-01-07 MED ORDER — AMPHETAMINE-DEXTROAMPHETAMINE 10 MG PO TABS
ORAL_TABLET | ORAL | 0 refills | Status: DC
  Filled 2022-01-07: qty 180, 90d supply, fill #0

## 2022-01-07 NOTE — Progress Notes (Signed)
? ?Established Patient Office Visit ? ?Subjective:  ?Patient ID: Rose Long, female    DOB: 1961-08-18  Age: 61 y.o. MRN: [id number] ? ?CC:  ?Chief Complaint  ?Patient presents with  ? Weight Management  ?  Patient currently taking Ozempic 0.'25mg'$  with weight loss of about 4 pounds. Previous weight 225. Current weight 222.   ? migraines  ?  Patient would like to discuss Nurtec   ? Discuss Covid Vaccine  ?  Patient would like to discuss if she should receive 5th Covid vaccine now or wait until June/closer to trip.   ? ? ?HPI ?Rose Long presents for weight management - Patient currently taking Ozempic 0.'25mg'$  with weight loss of about 4 pounds.  She will be starting the 0.5 mg with her next injection sees been tolerating well so far without any significant side effects no significant nausea. ? ?F/U migraines -currently uses sumatriptan for rescue but took a friend's Nurtec at church and found it really effective.  She would like to consider prescription for this.  Getting a migraine about every 3 days. ? ?Patient would like to discuss if she should receive 5th Covid vaccine now or wait until June/closer to trip ? ?She has had 2 endoscopies in the last several months.  They said she did not have Barrett's esophagus but she does have a hiatal hernia.  She is currently on her PPI twice a day but they want to be able to decrease down to once a day so they are planning on repeating her endoscopy in about 4 months. ? ? ?Past Medical History:  ?Diagnosis Date  ? Anemia   ? Arthritis   ? Bronchitis 2019  ? Complication of anesthesia   ? PONV  ? Family history of adverse reaction to anesthesia   ? pt's mother stopped breathing possibly due to sleep apnea  ? Hiatal hernia   ? Migraine   ? Obesity   ? Simple ovarian cyst   ? Right  ? ? ?Past Surgical History:  ?Procedure Laterality Date  ? ABDOMINOPLASTY/PANNICULECTOMY    ? BREAST SURGERY  1998  ? reduction  ? BREAST SURGERY  10/2013  ? Lift, tummy tuck  ? NASAL  SINUS SURGERY  1999  ? REDUCTION MAMMAPLASTY  1998  ? REDUCTION MAMMAPLASTY  2017  ? Breast lift  ? ROUX-EN-Y PROCEDURE  02/2010  ? 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 Procedure: TOTAL KNEE ARTHROPLASTY;  Surgeon: Melrose Nakayama, MD;  Location: [facility];  Service: Orthopedics;  Laterality: Left;  ? under arm skin removed    ? uterine ablation  2012  ? ? ?Family History  ?Problem Relation Age of Onset  ? Hypertension Mother   ? Bladder Cancer Mother   ? Hyperlipidemia Mother   ? Migraines Mother   ? Obesity Mother   ? Rheum arthritis Mother   ? Hypertension Father   ? CVA Father   ? Heart failure Father   ? Arthritis Father   ? Hyperlipidemia Father   ? Kidney failure Father   ? Heart disease Maternal Grandfather   ? Lung cancer Paternal Grandmother   ?     lung  ? Alcohol abuse Paternal Uncle   ? Diabetes Cousin   ? Arthritis Sister   ? Hypertension Brother   ? Obesity Brother   ? Rheum arthritis Brother   ? ? ?Social History  ? ?Socioeconomic History  ? Marital status: Married  ?  Spouse name: Not on file  ?  Number of children: 0  ? Years of education: Masters  ? Highest education level: Not on file  ?Occupational History  ? Occupation: Therapist  ?  Comment: [employer]  ?Tobacco Use  ? Smoking status: Never  ? Smokeless tobacco: Never  ?Vaping Use  ? Vaping Use: Never used  ?Substance and Sexual Activity  ? Alcohol use: Yes  ?  Alcohol/week: 1.0 standard drink  ?  Types: 1 Standard drinks or equivalent per week  ?  Comment: socially  ? Drug use: No  ? Sexual activity: Yes  ?  Birth control/protection: None  ?Other Topics Concern  ? Not on file  ?Social History Narrative  ? Parents moved into her home on June 2013. Lives with her spouse.  2 caffeine drinks per day.  Takes a 30 minute walk 7 days a week. Left-handed  ? ?Social Determinants of Health  ? ?Financial Resource Strain: Not on file  ?Food Insecurity: Not on file  ?Transportation Needs: Not on file  ?Physical Activity: Not on file  ?Stress: Not on file   ?Social Connections: Not on file  ?Intimate Partner Violence: Not on file  ? ? ?Outpatient Medications Prior to Visit  ?Medication Sig Dispense Refill  ? acetaminophen (TYLENOL) 650 MG CR tablet Take 1 tablet (650 mg total) by mouth every 8 (eight) hours as needed for pain. 90 tablet 3  ? amphetamine-dextroamphetamine (ADDERALL) 10 MG tablet Take 1 tablet (10 mg total) by mouth 2 (two) times daily. 60 tablet 0  ? amphetamine-dextroamphetamine (ADDERALL) 10 MG tablet Take 1 tablet (10 mg total) by mouth 2 (two) times daily. 60 tablet 0  ? atovaquone-proguanil (MALARONE) 250-100 MG TABS tablet Take 1 tablet by mouth daily. Start 1 day before travel and D/C 7 days after return home 20 tablet 0  ? calcium citrate-vitamin D 200-200 MG-UNIT TABS Take 1 tablet by mouth daily.    ? Cholecalciferol 1.25 MG (50000 UT) capsule Take 1 capsule (50,000 Units total) by mouth once a week. 12 capsule 3  ? ferrous sulfate 325 (65 FE) MG tablet Take 1 tablet (325 mg total) by mouth daily with breakfast. 90 tablet 3  ? MAGNESIUM CITRATE PO Take 1 tablet by mouth 2 (two) times daily.     ? Multiple Vitamin (MULTIVITAMIN) tablet Take 1 tablet by mouth daily.    ? omeprazole (PRILOSEC) 40 MG capsule Take 1 capsule (40 mg total) by mouth 2 (two) times daily. 60 capsule 2  ? pantoprazole (PROTONIX) 40 MG tablet Take 1 tablet (40 mg total) by mouth daily. 30 tablet 0  ? Semaglutide,0.25 or 0.'5MG'$ /DOS, (OZEMPIC, 0.25 OR 0.5 MG/DOSE,) 2 MG/1.5ML SOPN Inject 0.5 mg into the skin once a week. 1.5 mL 1  ? sertraline (ZOLOFT) 50 MG tablet Take 1 tablet (50 mg total) by mouth daily. 90 tablet 1  ? SUMAtriptan (IMITREX) 100 MG tablet TAKE 1 TABLET BY MOUTH EVERY 2 HOURS AS NEEDED FOR MIGRAINE. MAY REPEAT IN 2 HOURS IF HEADACHE PERSISTS OR RECURS. MAX OF 2 TABLETS IN ONE DAY 10 tablet 5  ? ?No facility-administered medications prior to visit.  ? ? ?Allergies  ?Allergen Reactions  ? Sulfa Antibiotics Anaphylaxis  ? Sulfamethoxazole-Trimethoprim  Anaphylaxis, Swelling and Rash  ?  Redness and swelling  ? Bee Venom   ? Trazodone And Nefazodone   ?  headache  ? ? ?ROS ?Review of Systems ? ?  ?Objective:  ?  ?Physical Exam ?Constitutional:   ?   Appearance: Normal appearance.  She is well-developed.  ?HENT:  ?   Head: Normocephalic and atraumatic.  ?Cardiovascular:  ?   Rate and Rhythm: Normal rate and regular rhythm.  ?   Heart sounds: Normal heart sounds.  ?Pulmonary:  ?   Effort: Pulmonary effort is normal.  ?   Breath sounds: Normal breath sounds.  ?Skin: ?   General: Skin is warm and dry.  ?Neurological:  ?   Mental Status: She is alert and oriented to person, place, and time.  ?Psychiatric:     ?   Behavior: Behavior normal.  ? ? ?BP (!) 141/81   Pulse 97   Resp 16   Ht '5\' 4"'$  (1.626 m)   Wt 222 lb 1.6 oz (100.7 kg)   SpO2 97%   BMI 38.12 kg/m?  ?Wt Readings from Last 3 Encounters:  ?01/07/22 222 lb 1.6 oz (100.7 kg)  ?11/27/21 225 lb 6.4 oz (102.2 kg)  ?05/22/21 222 lb (100.7 kg)  ? ? ? ?Health Maintenance Due  ?Topic Date Due  ? COVID-19 Vaccine (5 - Booster for Monon series) 08/07/2021  ? ? ?There are no preventive care reminders to display for this patient. ? ?Lab Results  ?Component Value Date  ? TSH 1.62 06/26/2020  ? ?Lab Results  ?Component Value Date  ? WBC 6.6 06/26/2020  ? HGB 13.2 06/26/2020  ? HCT 41.1 06/26/2020  ? MCV 83.5 06/26/2020  ? PLT 355 06/26/2020  ? ?Lab Results  ?Component Value Date  ? NA 140 03/05/2021  ? K 5.5 (H) 03/05/2021  ? CO2 29 03/05/2021  ? GLUCOSE 90 03/05/2021  ? BUN 12 03/05/2021  ? CREATININE 0.70 03/05/2021  ? BILITOT 0.3 03/05/2021  ? ALKPHOS 70 06/02/2015  ? AST 18 03/05/2021  ? ALT 14 03/05/2021  ? PROT 7.1 03/05/2021  ? ALBUMIN 3.6 06/02/2015  ? CALCIUM 10.0 03/05/2021  ? ANIONGAP 9 09/19/2015  ? ?Lab Results  ?Component Value Date  ? CHOL 224 (H) 03/05/2021  ? ?Lab Results  ?Component Value Date  ? HDL 64 03/05/2021  ? ?Lab Results  ?Component Value Date  ? LDLCALC 141 (H) 03/05/2021  ? ?Lab Results   ?Component Value Date  ? TRIG 84 03/05/2021  ? ?Lab Results  ?Component Value Date  ? CHOLHDL 3.5 03/05/2021  ? ?No results found for: HGBA1C ? ?  ?Assessment & Plan:  ? ?Problem List Items Addressed This Visit   ? ?  ? Ca

## 2022-01-07 NOTE — Assessment & Plan Note (Addendum)
Discussed options.  She would really like to get on Nurtec prophylactically.  Prescription sent to pharmacy.  Follow-up in a couple months to make sure that she is doing well and is happy with her current regimen.  Okay to use Imitrex as needed for rescue. ?

## 2022-01-07 NOTE — Assessment & Plan Note (Signed)
Visit #: 3 ?Starting Weight:225 lbs ?? ?Current weight: 222 lbs  ?Previous weight: 225 lbs ?Change in weight: down 3 lbs  ?Goal weight: 180 lbs ?Dietary goals:continue to work on cutting out veggies.  ?Exercise goals: started the BELT program last week. It will be 48 session.   ?Medication: Start Ozempic 0.5 mg. ?Follow-up and referrals:  6 weeks.  ?

## 2022-01-07 NOTE — Telephone Encounter (Addendum)
Initiated Prior authorization [id number] ?Via: Covermymeds ?Case/Key:[id number] ?Status: approved as of 01/07/22 ?Reason:Quant limit 16 max for 30 day supply,The authorization is effective for a maximum of 6 fills from 01/07/2022 to 07/09/2022,  ?Notified Pt via: Mychart ?

## 2022-01-08 ENCOUNTER — Ambulatory Visit: Payer: 59 | Admitting: Family Medicine

## 2022-01-08 ENCOUNTER — Other Ambulatory Visit (HOSPITAL_BASED_OUTPATIENT_CLINIC_OR_DEPARTMENT_OTHER): Payer: Self-pay

## 2022-01-27 IMAGING — DX DG CHEST 2V
2 series · 2 of 2 positions shown · non-contrast
Comparison: Radiograph 06/03/2020

CLINICAL DATA: Cough and fatigue.  COVID positive 06/03/2020

EXAM:
CHEST - 2 VIEW

[chest pa]
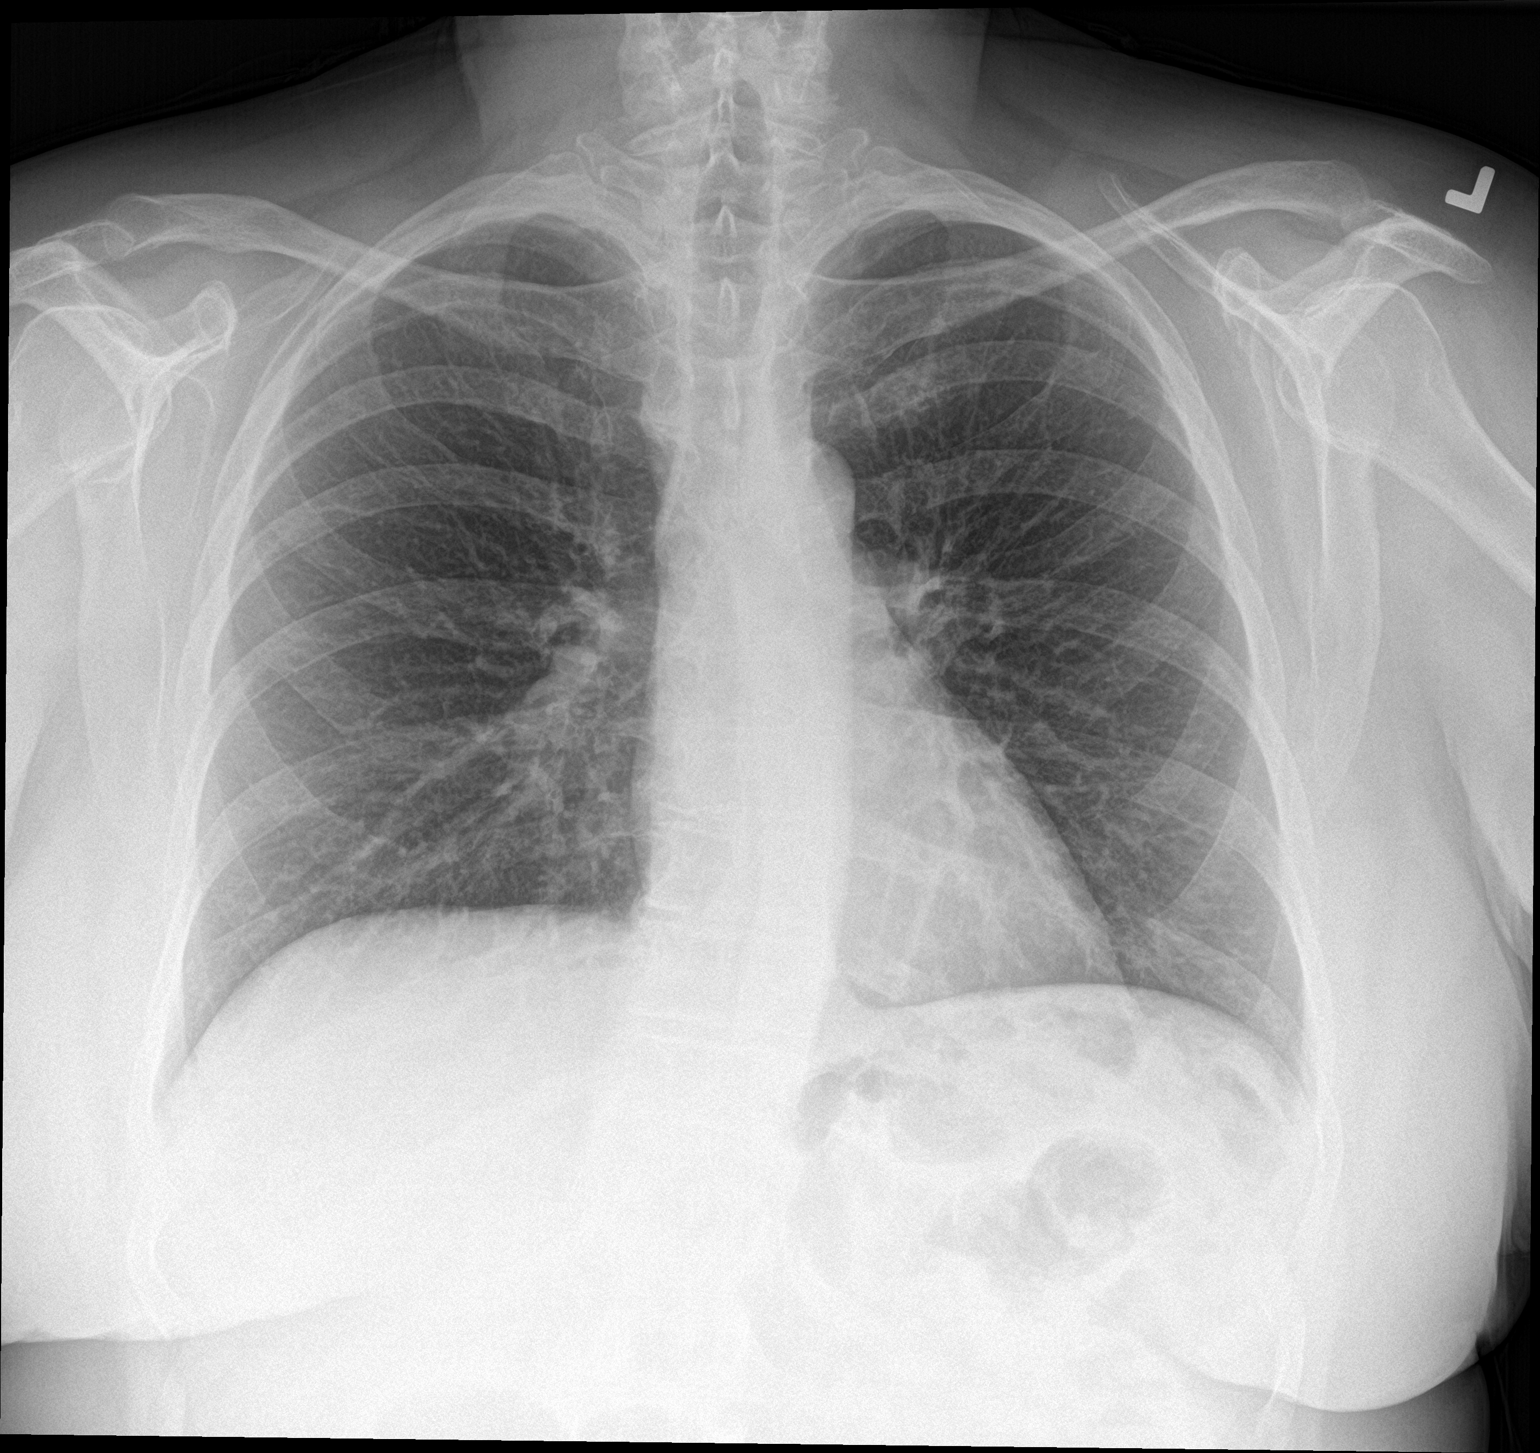

[chest lat]
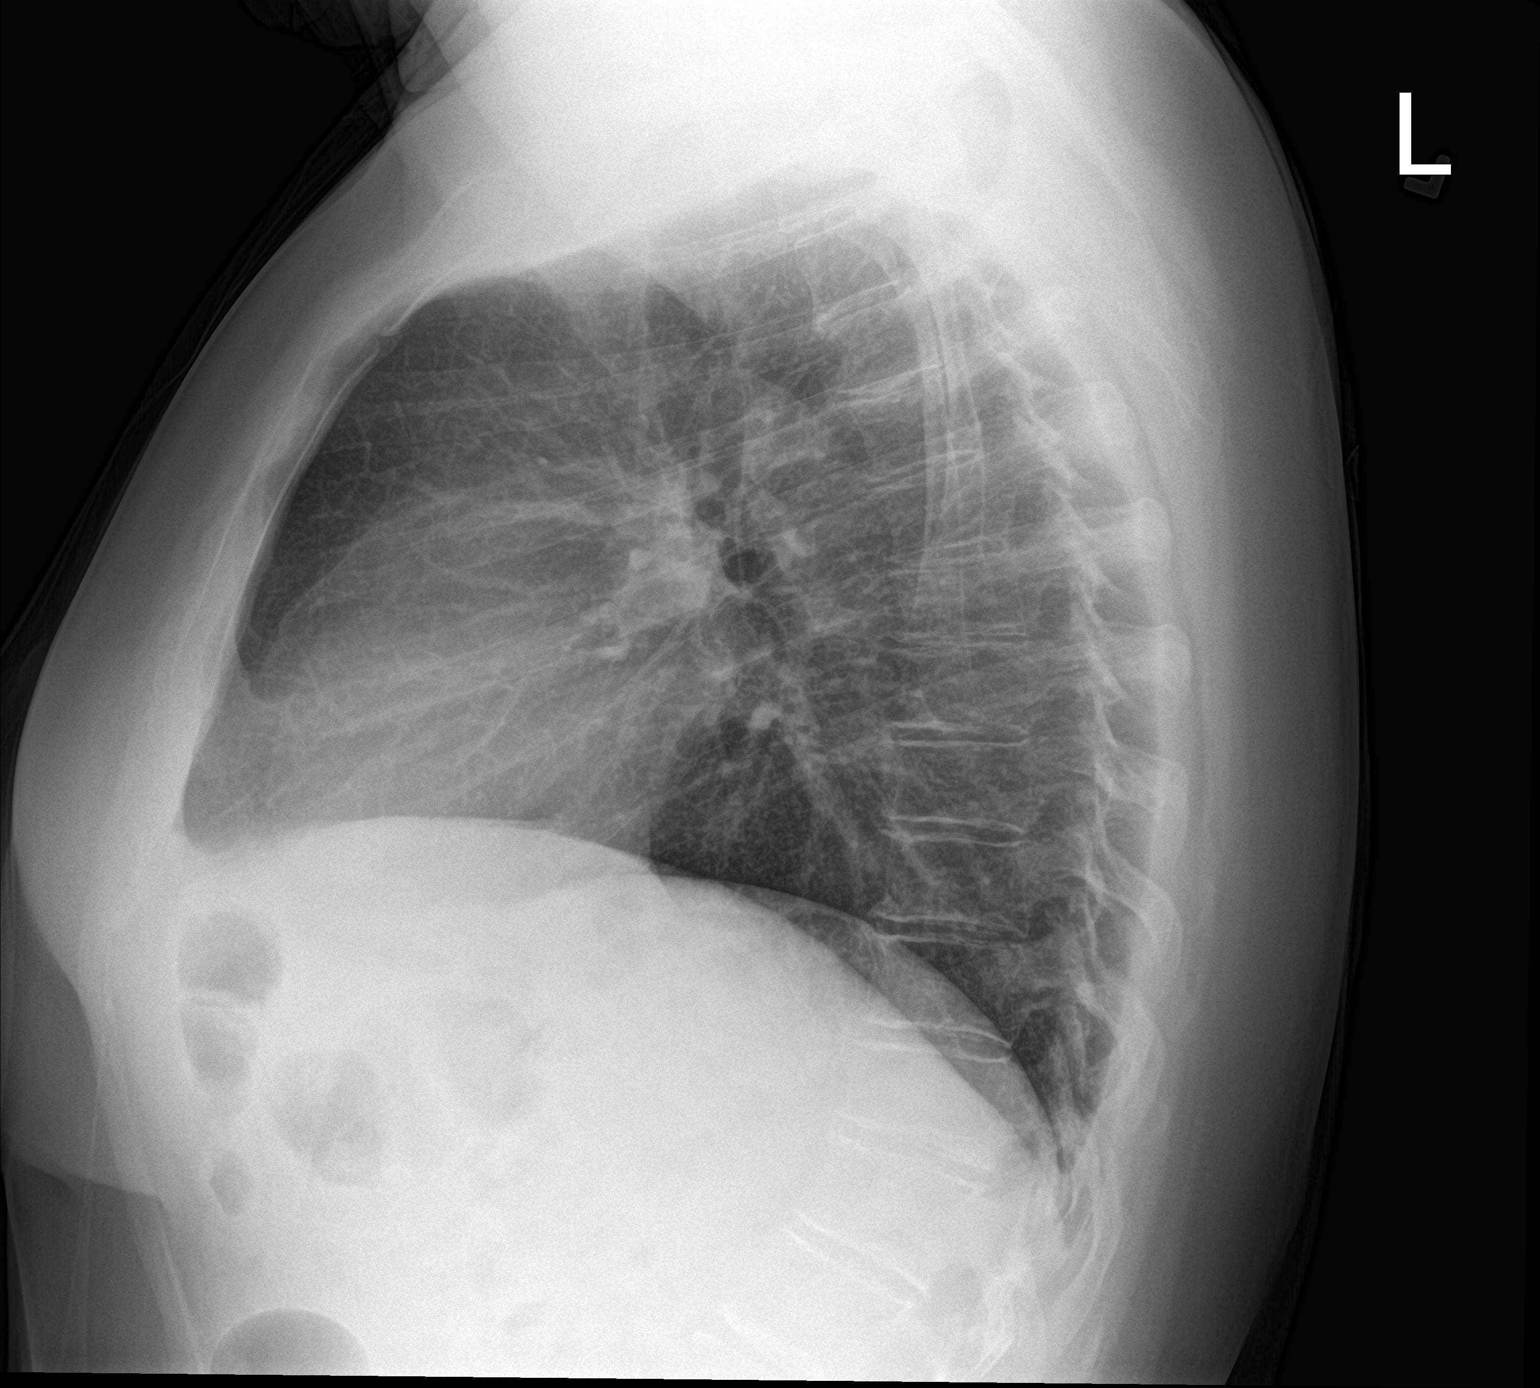

[2 of 2 positions shown; findings below may reference images not displayed]

FINDINGS: The cardiomediastinal contours are normal. The lungs are clear.
Pulmonary vasculature is normal. No consolidation, pleural effusion,
or pneumothorax. No acute osseous abnormalities are seen.
IMPRESSION: Clear lungs.  No evidence of pneumonia or acute findings.

## 2022-02-05 ENCOUNTER — Other Ambulatory Visit (HOSPITAL_BASED_OUTPATIENT_CLINIC_OR_DEPARTMENT_OTHER): Payer: Self-pay

## 2022-02-12 ENCOUNTER — Other Ambulatory Visit (HOSPITAL_BASED_OUTPATIENT_CLINIC_OR_DEPARTMENT_OTHER): Payer: Self-pay

## 2022-03-18 ENCOUNTER — Encounter: Payer: Self-pay | Admitting: Family Medicine

## 2022-03-18 ENCOUNTER — Other Ambulatory Visit (HOSPITAL_BASED_OUTPATIENT_CLINIC_OR_DEPARTMENT_OTHER): Payer: Self-pay

## 2022-03-18 ENCOUNTER — Ambulatory Visit (INDEPENDENT_AMBULATORY_CARE_PROVIDER_SITE_OTHER): Payer: 59 | Admitting: Family Medicine

## 2022-03-18 VITALS — BP 136/72 | HR 71 | Ht 65.5 in | Wt 210.0 lb

## 2022-03-18 DIAGNOSIS — Z7689 Persons encountering health services in other specified circumstances: Secondary | ICD-10-CM

## 2022-03-18 DIAGNOSIS — Z298 Encounter for other specified prophylactic measures: Secondary | ICD-10-CM | POA: Diagnosis not present

## 2022-03-18 DIAGNOSIS — Z0000 Encounter for general adult medical examination without abnormal findings: Secondary | ICD-10-CM

## 2022-03-18 DIAGNOSIS — Z23 Encounter for immunization: Secondary | ICD-10-CM

## 2022-03-18 DIAGNOSIS — Z7184 Encounter for health counseling related to travel: Secondary | ICD-10-CM | POA: Diagnosis not present

## 2022-03-18 DIAGNOSIS — L732 Hidradenitis suppurativa: Secondary | ICD-10-CM

## 2022-03-18 MED ORDER — AZITHROMYCIN 250 MG PO TABS
ORAL_TABLET | ORAL | 0 refills | Status: AC
  Filled 2022-03-18: qty 6, 5d supply, fill #0

## 2022-03-18 MED ORDER — DOXYCYCLINE HYCLATE 100 MG PO TABS
100.0000 mg | ORAL_TABLET | Freq: Two times a day (BID) | ORAL | 0 refills | Status: DC
  Filled 2022-03-18: qty 14, 7d supply, fill #0

## 2022-03-18 MED ORDER — ONDANSETRON HCL 4 MG PO TABS
4.0000 mg | ORAL_TABLET | Freq: Three times a day (TID) | ORAL | 0 refills | Status: DC | PRN
  Filled 2022-03-18: qty 20, 7d supply, fill #0

## 2022-03-18 MED ORDER — WEGOVY 1.7 MG/0.75ML ~~LOC~~ SOAJ
1.7000 mg | SUBCUTANEOUS | 1 refills | Status: DC
  Filled 2022-03-18: qty 3, 28d supply, fill #0
  Filled 2022-05-04: qty 3, 28d supply, fill #1

## 2022-03-18 MED ORDER — ATOVAQUONE-PROGUANIL HCL 250-100 MG PO TABS
1.0000 | ORAL_TABLET | Freq: Every day | ORAL | 0 refills | Status: DC
  Filled 2022-03-18: qty 20, 20d supply, fill #0

## 2022-03-18 NOTE — Progress Notes (Unsigned)
Complete physical exam  Patient: Rose Long   DOB: 11-09-1960   61 y.o. Female  MRN: [id number]  Subjective:    Chief Complaint  Patient presents with   Annual Exam    Rose Long is a 61 y.o. female who presents today for a complete physical exam. She reports consuming a {diet types:17450} diet. {types:19826} She generally feels {DESC; WELL/FAIRLY WELL/POORLY:18703}. She reports sleeping {DESC; WELL/FAIRLY WELL/POORLY:18703}. She {does/does not:200015} have additional problems to discuss today.    Follow-up weight management-she is currently on Ozempic 0.5 mg.  Status post bariatric surgery.  She received a letter from her insurance company indicating that they would no longer cover the [place] for weight loss purposes only for diabetes.  Most recent fall risk assessment:    03/18/2022    1:23 PM  [place] in the past year? 0  Number falls in past yr: 0  Injury with Fall? 0  Risk for fall due to : No Fall Risks  Follow up Falls prevention discussed     Most recent depression screenings:    03/18/2022    1:23 PM 01/07/2022   11:10 AM  PHQ 2/9 Scores  PHQ - 2 Score 0 0  PHQ- 9 Score 3     {VISON DENTAL STD PSA (Optional):27386}  {History (Optional):23778}  Patient Care Team: Hali Marry, MD as PCP - General (Family Medicine) Orie Rout, MD as Referring Physician (Specialist)   Outpatient Medications Prior to Visit  Medication Sig   acetaminophen (TYLENOL) 650 MG CR tablet Take 1 tablet (650 mg total) by mouth every 8 (eight) hours as needed for pain.   amphetamine-dextroamphetamine (ADDERALL) 10 MG tablet Take 1 tablet (10 mg total) by mouth 2 (two) times daily.   amphetamine-dextroamphetamine (ADDERALL) 10 MG tablet Take 1 tablet (10 mg total) by mouth 2 (two) times daily.   amphetamine-dextroamphetamine (ADDERALL) 10 MG tablet TAKE 1 TABLET (10 MG TOTAL) BY MOUTH 2 (TWO) TIMES DAILY WITH A MEAL.   calcium citrate-vitamin D  200-200 MG-UNIT TABS Take 1 tablet by mouth daily.   Cholecalciferol 1.25 MG (50000 UT) capsule Take 1 capsule (50,000 Units total) by mouth once a week.   ferrous sulfate 325 (65 FE) MG tablet Take 1 tablet (325 mg total) by mouth daily with breakfast.   MAGNESIUM CITRATE PO Take 1 tablet by mouth 2 (two) times daily.    Multiple Vitamin (MULTIVITAMIN) tablet Take 1 tablet by mouth daily.   omeprazole (PRILOSEC) 40 MG capsule Take 1 capsule (40 mg total) by mouth 2 (two) times daily.   omeprazole (PRILOSEC) 40 MG capsule Take 40 mg by mouth 2 (two) times daily.   Rimegepant Sulfate (NURTEC) 75 MG TBDP Take 1 tablet (75 mg) by mouth every other day.   Semaglutide,0.25 or 0.'5MG'$ /DOS, (OZEMPIC, 0.25 OR 0.5 MG/DOSE,) 2 MG/1.5ML SOPN Inject 0.5 mg into the skin once a week.   sertraline (ZOLOFT) 50 MG tablet Take 1 tablet (50 mg total) by mouth daily.   SUMAtriptan (IMITREX) 100 MG tablet TAKE 1 TABLET BY MOUTH EVERY 2 HOURS AS NEEDED FOR MIGRAINE. MAY REPEAT IN 2 HOURS IF HEADACHE PERSISTS OR RECURS. MAX OF 2 TABLETS IN ONE DAY   [DISCONTINUED] atovaquone-proguanil (MALARONE) 250-100 MG TABS tablet Take 1 tablet by mouth daily. Start 1 day before travel and D/C 7 days after return home   [DISCONTINUED] pantoprazole (PROTONIX) 40 MG tablet Take 1 tablet (40 mg total) by mouth daily.   No  facility-administered medications prior to visit.    ROS        Objective:     BP 140/78   Pulse 71   Ht 5' 5.5" (1.664 m)   Wt 210 lb (95.3 kg)   SpO2 97%   BMI 34.41 kg/m  {Vitals History (Optional):23777}  Physical Exam   No results found for any visits on 03/18/22. {Show previous labs (optional):23779}    Assessment & Plan:    Routine Health Maintenance and Physical Exam  Immunization History  Administered Date(s) Administered   Fluad Quad(high Dose 65+) 07/04/2019   Hep A / Hep B 07/03/2018, 07/10/2018, 07/24/2018   Influenza Split 07/18/2012   Influenza,inj,Quad PF,6+ Mos 08/15/2014,  07/31/2016, 08/18/2017, 06/26/2018, 08/01/2020   Influenza-Unspecified 07/19/2015, 08/01/2021   PFIZER Comirnaty(Gray Top)Covid-19 Tri-Sucrose Vaccine 06/12/2021   PFIZER(Purple Top)SARS-COV-2 Vaccination 10/15/2019, 11/16/2019, 07/26/2020   Pneumococcal Polysaccharide-23 10/01/2015   Tdap 11/17/2012   Typhoid Live 07/03/2018   Zoster Recombinat (Shingrix) 05/22/2021, 11/27/2021    Health Maintenance  Topic Date Due   COVID-19 Vaccine (5 - Booster for Pfizer series) 08/07/2021   INFLUENZA VACCINE  05/18/2022   TETANUS/TDAP  11/17/2022   MAMMOGRAM  05/02/2023   COLONOSCOPY (Pts 45-14yr Insurance coverage will need to be confirmed)  11/18/2023   PAP SMEAR-Modifier  03/24/2025   Hepatitis C Screening  Completed   HIV Screening  Completed   Zoster Vaccines- Shingrix  Completed   Pneumococcal Vaccine 171658Years old  Aged Out   HPV VACCINES  Aged Out    Discussed health benefits of physical activity, and encouraged her to engage in regular exercise appropriate for her age and condition.  Problem List Items Addressed This Visit       Other   Encounter for weight management - Primary    Visit #: 4 Starting Weight:225 lbs   Current weight:   lbs  Previous weight: 222 lbs Change in weight: down 3 lbs  Goal weight: 180 lbs Dietary goals:continue to work on [employer]  Exercise goals: Engaged in the BELT program. It will be 48 sessions total.   Medication:  Follow-up and referrals:  6 weeks.         WEllness visit -  No follow-ups on file.     CBeatrice Lecher MD

## 2022-03-18 NOTE — Progress Notes (Deleted)
   Established Patient Office Visit  Subjective   Patient ID: Rose Long, female    DOB: 12/21/1960  Age: 61 y.o. MRN: [id number]  No chief complaint on file.   HPI  Follow-up weight management-she is currently on Ozempic 0.5 mg.  Status post bariatric surgery.  She received a letter from her insurance company indicating that they would no longer cover the [place] for weight loss purposes only for diabetes.  {History (Optional):23778}  ROS    Objective:     There were no vitals taken for this visit. {Vitals History (Optional):23777}  Physical Exam   No results found for any visits on 03/18/22.  {Labs (Optional):23779}  The 10-year ASCVD risk score (Arnett DK, et al., 2019) is: 3.5%    Assessment & Plan:   Problem List Items Addressed This Visit       Other   Encounter for weight management - Primary    Visit #: 4 Starting Weight:225 lbs   Current weight:   lbs  Previous weight: 222 lbs Change in weight: down 3 lbs  Goal weight: 180 lbs Dietary goals:continue to work on [employer].  Exercise goals: Engaged in the BELT program. It will be 48 sessions total.   Medication:  Follow-up and referrals:  6 weeks.         No follow-ups on file.    Beatrice Lecher, MD

## 2022-03-18 NOTE — Assessment & Plan Note (Signed)
Visit #:4 Starting TDHRCB:638 lbs  Current weight:  lbs Previous weight: 222 lbs Change in weight: down 3 lbs  Goal weight:180 lbs Dietary goals:continue to work on [employer].  Exercise goals:Engaged in the BELT program. It will be 48 sessions total. Medication: Follow-up and referrals:6 weeks.

## 2022-03-19 ENCOUNTER — Other Ambulatory Visit (HOSPITAL_BASED_OUTPATIENT_CLINIC_OR_DEPARTMENT_OTHER): Payer: Self-pay

## 2022-03-19 DIAGNOSIS — L732 Hidradenitis suppurativa: Secondary | ICD-10-CM | POA: Insufficient documentation

## 2022-03-19 NOTE — Assessment & Plan Note (Signed)
Updated vaccines.  meds given for malaria prophylaxis.  zofran prn nauseas and zpack for traveler's diarrhea.

## 2022-03-19 NOTE — Assessment & Plan Note (Signed)
Planning on consulting with specialist to see if candidate for Biologics.  Also given a rx for doxy for travel.

## 2022-03-22 ENCOUNTER — Other Ambulatory Visit (HOSPITAL_BASED_OUTPATIENT_CLINIC_OR_DEPARTMENT_OTHER): Payer: Self-pay

## 2022-03-23 ENCOUNTER — Other Ambulatory Visit (HOSPITAL_BASED_OUTPATIENT_CLINIC_OR_DEPARTMENT_OTHER): Payer: Self-pay

## 2022-03-24 ENCOUNTER — Other Ambulatory Visit (HOSPITAL_BASED_OUTPATIENT_CLINIC_OR_DEPARTMENT_OTHER): Payer: Self-pay

## 2022-03-25 ENCOUNTER — Other Ambulatory Visit (HOSPITAL_BASED_OUTPATIENT_CLINIC_OR_DEPARTMENT_OTHER): Payer: Self-pay

## 2022-03-26 ENCOUNTER — Other Ambulatory Visit (HOSPITAL_BASED_OUTPATIENT_CLINIC_OR_DEPARTMENT_OTHER): Payer: Self-pay

## 2022-03-29 ENCOUNTER — Other Ambulatory Visit (HOSPITAL_BASED_OUTPATIENT_CLINIC_OR_DEPARTMENT_OTHER): Payer: Self-pay

## 2022-03-29 DIAGNOSIS — L720 Epidermal cyst: Secondary | ICD-10-CM | POA: Diagnosis not present

## 2022-03-29 DIAGNOSIS — L570 Actinic keratosis: Secondary | ICD-10-CM | POA: Diagnosis not present

## 2022-03-29 DIAGNOSIS — X32XXXS Exposure to sunlight, sequela: Secondary | ICD-10-CM | POA: Diagnosis not present

## 2022-03-29 DIAGNOSIS — L732 Hidradenitis suppurativa: Secondary | ICD-10-CM | POA: Diagnosis not present

## 2022-03-29 DIAGNOSIS — L578 Other skin changes due to chronic exposure to nonionizing radiation: Secondary | ICD-10-CM | POA: Diagnosis not present

## 2022-03-29 DIAGNOSIS — L814 Other melanin hyperpigmentation: Secondary | ICD-10-CM | POA: Diagnosis not present

## 2022-03-30 ENCOUNTER — Telehealth: Payer: Self-pay

## 2022-03-30 ENCOUNTER — Other Ambulatory Visit (HOSPITAL_BASED_OUTPATIENT_CLINIC_OR_DEPARTMENT_OTHER): Payer: Self-pay

## 2022-03-30 DIAGNOSIS — Z0000 Encounter for general adult medical examination without abnormal findings: Secondary | ICD-10-CM | POA: Diagnosis not present

## 2022-03-30 NOTE — Telephone Encounter (Addendum)
Initiated Prior authorization GOT:LXBWIO 1.'7mg'$   Via: Covermymeds Case/Key:[id number] Status: approved  as of 03/30/22 Reason: The authorization is effective for a maximum of 7 fills from 03/31/2022 to 10/20/2022, as long as the member is enrolled in their current health plan. The request was reviewed and approved by a licensed clinical pharmacist. This request was approved for 479m per 28 days. Additional authorizations have been entered for the following: Wegovy 0.'25mg'$ /0.564mallowing 79m31mer 28 days for 7 fills; please reference authorization [id number] Wegovy 0.'5mg'$ /0.5mL779mlowing 79mL 45m 28 days for 7 fills; please reference authorization [id number] '1mg'$ /0.5mL a90mwing 79mL pe73m8 days for 7 fills; please reference authorization [id number] 2.'4mg'$ /0.75mL al17mng 3mL per 51mdays for 7 fills; please reference authorization [id number] 03/31/2022 through 10/20/2022. Notified Pt via: Mychart

## 2022-03-31 ENCOUNTER — Other Ambulatory Visit (HOSPITAL_BASED_OUTPATIENT_CLINIC_OR_DEPARTMENT_OTHER): Payer: Self-pay

## 2022-03-31 LAB — VITAMIN D 25 HYDROXY (VIT D DEFICIENCY, FRACTURES): Vit D, 25-Hydroxy: 29 ng/mL — ABNORMAL LOW (ref 30–100)

## 2022-03-31 LAB — CBC
HCT: 39.4 % (ref 35.0–45.0)
Hemoglobin: 12.2 g/dL (ref 11.7–15.5)
MCH: 25 pg — ABNORMAL LOW (ref 27.0–33.0)
MCHC: 31 g/dL — ABNORMAL LOW (ref 32.0–36.0)
MCV: 80.7 fL (ref 80.0–100.0)
MPV: 12 fL (ref 7.5–12.5)
Platelets: 419 10*3/uL — ABNORMAL HIGH (ref 140–400)
RBC: 4.88 10*6/uL (ref 3.80–5.10)
RDW: 14.4 % (ref 11.0–15.0)
WBC: 6.6 10*3/uL (ref 3.8–10.8)

## 2022-03-31 LAB — COMPLETE METABOLIC PANEL WITH GFR
AG Ratio: 1.1 (calc) (ref 1.0–2.5)
ALT: 9 U/L (ref 6–29)
AST: 13 U/L (ref 10–35)
Albumin: 3.8 g/dL (ref 3.6–5.1)
Alkaline phosphatase (APISO): 73 U/L (ref 37–153)
BUN: 10 mg/dL (ref 7–25)
CO2: 27 mmol/L (ref 20–32)
Calcium: 9.8 mg/dL (ref 8.6–10.4)
Chloride: 107 mmol/L (ref 98–110)
Creat: 0.81 mg/dL (ref 0.50–1.03)
Globulin: 3.6 g/dL (calc) (ref 1.9–3.7)
Glucose, Bld: 82 mg/dL (ref 65–99)
Potassium: 5 mmol/L (ref 3.5–5.3)
Sodium: 142 mmol/L (ref 135–146)
Total Bilirubin: 0.3 mg/dL (ref 0.2–1.2)
Total Protein: 7.4 g/dL (ref 6.1–8.1)
eGFR: 84 mL/min/{1.73_m2} (ref >=60)

## 2022-03-31 LAB — LIPID PANEL W/REFLEX DIRECT LDL
Cholesterol: 224 mg/dL — ABNORMAL HIGH (ref <200)
HDL: 55 mg/dL (ref >=50)
LDL Cholesterol (Calc): 149 mg/dL (calc) — ABNORMAL HIGH
Non-HDL Cholesterol (Calc): 169 mg/dL (calc) — ABNORMAL HIGH (ref <130)
Total CHOL/HDL Ratio: 4.1 (calc) (ref <5.0)
Triglycerides: 96 mg/dL (ref <150)

## 2022-03-31 LAB — MAGNESIUM: Magnesium: 2.1 mg/dL (ref 1.5–2.5)

## 2022-04-01 ENCOUNTER — Other Ambulatory Visit (HOSPITAL_BASED_OUTPATIENT_CLINIC_OR_DEPARTMENT_OTHER): Payer: Self-pay

## 2022-04-01 NOTE — Progress Notes (Signed)
Hi Kathy, iron is definitely back down again.  Please start the over-the-counter iron again.  If you are having difficulty finding something that you tolerate well then please let me know.  Also work on eating iron rich foods.  Cholesterol is still elevated.  Continue to work on [employer] exercise and healthy diet to bring that down.  Vitamin D is still low.  So also recommend either 25 or 50 mcg daily.  If you are already taking that then please let me know and we can make an adjustment.  No anemia.  Magnesium looks good.  B12 looks good.  Folate is normal.  Metabolic panel looks good.  Just make sure you are also taking a One-A-Day women's multivitamin.  Vitamin B1 and B6 are still pending.  The 10-year ASCVD risk score (Arnett DK, et al., 2019) is: 3.7%   Values used to calculate the score:     Age: 9 years     Sex: Female     Is Non-Hispanic African American: No     Diabetic: No     Tobacco smoker: No     Systolic Blood Pressure: 585 mmHg     Is BP treated: No     HDL Cholesterol: 55 mg/dL     Total Cholesterol: 224 mg/dL

## 2022-04-05 LAB — FOLATE: Folate: 9.2 ng/mL

## 2022-04-05 LAB — VITAMIN B6: Vitamin B6: 7.7 ng/mL (ref 2.1–21.7)

## 2022-04-05 LAB — VITAMIN B12: Vitamin B-12: 317 pg/mL (ref 200–1100)

## 2022-04-05 LAB — IRON: Iron: 48 ug/dL (ref 45–160)

## 2022-04-05 LAB — FERRITIN: Ferritin: 5 ng/mL — ABNORMAL LOW (ref 16–232)

## 2022-04-05 LAB — VITAMIN B1: Vitamin B1 (Thiamine): 10 nmol/L (ref 8–30)

## 2022-04-06 NOTE — Progress Notes (Signed)
B1 and B6 in normal range but low normal. You could supplement both at the lowest dose daily.

## 2022-05-04 ENCOUNTER — Other Ambulatory Visit (HOSPITAL_BASED_OUTPATIENT_CLINIC_OR_DEPARTMENT_OTHER): Payer: Self-pay

## 2022-05-04 ENCOUNTER — Other Ambulatory Visit: Payer: Self-pay | Admitting: Family Medicine

## 2022-05-04 MED ORDER — OMEPRAZOLE 40 MG PO CPDR
DELAYED_RELEASE_CAPSULE | ORAL | 2 refills | Status: DC
  Filled 2022-05-04: qty 60, 30d supply, fill #0
  Filled 2022-06-03: qty 60, 30d supply, fill #1
  Filled 2022-07-19: qty 60, 30d supply, fill #2

## 2022-05-04 MED ORDER — FERROUS SULFATE 325 (65 FE) MG PO TABS
325.0000 mg | ORAL_TABLET | Freq: Every day | ORAL | 3 refills | Status: DC
  Filled 2022-05-04: qty 100, 100d supply, fill #0
  Filled 2022-12-13: qty 90, 90d supply, fill #1

## 2022-05-05 ENCOUNTER — Encounter: Payer: Self-pay | Admitting: Family Medicine

## 2022-05-05 ENCOUNTER — Other Ambulatory Visit (HOSPITAL_BASED_OUTPATIENT_CLINIC_OR_DEPARTMENT_OTHER): Payer: Self-pay

## 2022-05-05 ENCOUNTER — Ambulatory Visit (INDEPENDENT_AMBULATORY_CARE_PROVIDER_SITE_OTHER): Payer: 59 | Admitting: Family Medicine

## 2022-05-05 VITALS — BP 136/80 | HR 62 | Ht 65.5 in | Wt 202.0 lb

## 2022-05-05 DIAGNOSIS — R0781 Pleurodynia: Secondary | ICD-10-CM

## 2022-05-05 NOTE — Progress Notes (Signed)
Pt was on vacation and reports she was walking at dusk on a cobblestone paved area at dusk with her tri-focal lenses and stumbled and fell onto her L side

## 2022-05-05 NOTE — Progress Notes (Signed)
   Acute Office Visit  Subjective:     Patient ID: Rose Long, female    DOB: 1961/06/26, 61 y.o.   MRN: [id number]  Chief Complaint  Patient presents with   Fall    HPI Patient is in today for left anterior rib pain.  She says about 2 weeks ago while she was traveling she tripped on the cobblestones and fell forward and hit her left anterior rib cage.  Its been quite painful and sore since then it is painful to take a deep breath cough sneeze laugh etc.  She has not really been doing any specific treatment she did put some type of cream or rub on it at 1 point.  It still feels sore so she came in today as she just got back from traveling this weekend and was planning on going back to the gym but does not feel like she would be able to work out because of it.  ROS      Objective:    BP 136/80   Pulse 62   Ht 5' 5.5" (1.664 m)   Wt 202 lb (91.6 kg)   SpO2 98%   BMI 33.10 kg/m    Physical Exam Vitals reviewed.  Constitutional:      Appearance: She is well-developed.  HENT:     Head: Normocephalic and atraumatic.  Eyes:     Conjunctiva/sclera: Conjunctivae normal.  Cardiovascular:     Rate and Rhythm: Normal rate.  Pulmonary:     Effort: Pulmonary effort is normal.  Chest:       Comments: Area of tenderness  Skin:    General: Skin is dry.     Coloration: Skin is not pale.  Neurological:     Mental Status: She is alert and oriented to person, place, and time.  Psychiatric:        Behavior: Behavior normal.     No results found for any visits on 05/05/22.      Assessment & Plan:   Problem List Items Addressed This Visit   None Visit Diagnoses     Rib pain on left side    -  Primary      Anterior rib pain-suspect fracture.  Nothing significant causing any pulmonary issues lungs are clear on exam today.  We discussed conservative measures with splinting with coughing excusing etc.  Using Tylenol and occasional anti-inflammatory is okay with her  bariatric status and would not use regularly.  Recommending heat at this point in the little bit of gentle pressure.  If not improving or suddenly getting worse then please let us know.  No orders of the defined types were placed in this encounter.   No follow-ups on file.  Beatrice Lecher, MD

## 2022-05-13 ENCOUNTER — Other Ambulatory Visit (HOSPITAL_BASED_OUTPATIENT_CLINIC_OR_DEPARTMENT_OTHER): Payer: Self-pay

## 2022-05-17 ENCOUNTER — Other Ambulatory Visit (HOSPITAL_BASED_OUTPATIENT_CLINIC_OR_DEPARTMENT_OTHER): Payer: Self-pay

## 2022-06-03 ENCOUNTER — Other Ambulatory Visit (HOSPITAL_BASED_OUTPATIENT_CLINIC_OR_DEPARTMENT_OTHER): Payer: Self-pay

## 2022-06-03 ENCOUNTER — Other Ambulatory Visit: Payer: Self-pay | Admitting: Family Medicine

## 2022-06-03 DIAGNOSIS — R41840 Attention and concentration deficit: Secondary | ICD-10-CM

## 2022-06-03 DIAGNOSIS — Z7689 Persons encountering health services in other specified circumstances: Secondary | ICD-10-CM

## 2022-06-04 ENCOUNTER — Other Ambulatory Visit (HOSPITAL_BASED_OUTPATIENT_CLINIC_OR_DEPARTMENT_OTHER): Payer: Self-pay

## 2022-06-04 MED ORDER — WEGOVY 1.7 MG/0.75ML ~~LOC~~ SOAJ
1.7000 mg | SUBCUTANEOUS | 1 refills | Status: DC
  Filled 2022-06-04: qty 3, 28d supply, fill #0

## 2022-06-04 MED ORDER — AMPHETAMINE-DEXTROAMPHETAMINE 10 MG PO TABS
ORAL_TABLET | ORAL | 0 refills | Status: DC
  Filled 2022-06-04: qty 180, 90d supply, fill #0

## 2022-06-04 MED ORDER — WEGOVY 2.4 MG/0.75ML ~~LOC~~ SOAJ
2.4000 mg | SUBCUTANEOUS | 2 refills | Status: DC
  Filled 2022-06-04: qty 3, 28d supply, fill #0
  Filled 2022-07-19: qty 3, 28d supply, fill #1
  Filled 2022-08-15: qty 3, 28d supply, fill #2

## 2022-06-07 ENCOUNTER — Other Ambulatory Visit (HOSPITAL_BASED_OUTPATIENT_CLINIC_OR_DEPARTMENT_OTHER): Payer: Self-pay

## 2022-07-04 ENCOUNTER — Telehealth: Payer: Self-pay | Admitting: Neurology

## 2022-07-04 NOTE — Telephone Encounter (Signed)
Prior Authorization for Nurtec submitted via covermymeds. Awaiting response.

## 2022-07-19 ENCOUNTER — Encounter: Payer: Self-pay | Admitting: Family Medicine

## 2022-07-19 ENCOUNTER — Other Ambulatory Visit (HOSPITAL_BASED_OUTPATIENT_CLINIC_OR_DEPARTMENT_OTHER): Payer: Self-pay

## 2022-07-19 ENCOUNTER — Other Ambulatory Visit: Payer: Self-pay | Admitting: Family Medicine

## 2022-07-19 DIAGNOSIS — R41840 Attention and concentration deficit: Secondary | ICD-10-CM

## 2022-07-19 DIAGNOSIS — F411 Generalized anxiety disorder: Secondary | ICD-10-CM

## 2022-07-19 MED ORDER — SERTRALINE HCL 100 MG PO TABS
100.0000 mg | ORAL_TABLET | Freq: Every day | ORAL | 1 refills | Status: DC
  Filled 2022-07-19: qty 90, 90d supply, fill #0
  Filled 2022-10-16 – 2022-10-22 (×2): qty 90, 90d supply, fill #1

## 2022-07-20 ENCOUNTER — Other Ambulatory Visit (HOSPITAL_BASED_OUTPATIENT_CLINIC_OR_DEPARTMENT_OTHER): Payer: Self-pay

## 2022-07-20 NOTE — Telephone Encounter (Signed)
Can you check on this.  We sent 90 days supply in August.  She should be good till Nov.  Are they sending this early

## 2022-07-20 NOTE — Telephone Encounter (Signed)
It was an early refill request.

## 2022-08-16 ENCOUNTER — Other Ambulatory Visit (HOSPITAL_BASED_OUTPATIENT_CLINIC_OR_DEPARTMENT_OTHER): Payer: Self-pay

## 2022-09-13 ENCOUNTER — Other Ambulatory Visit (HOSPITAL_BASED_OUTPATIENT_CLINIC_OR_DEPARTMENT_OTHER): Payer: Self-pay

## 2022-09-13 ENCOUNTER — Other Ambulatory Visit: Payer: Self-pay | Admitting: Family Medicine

## 2022-09-13 MED ORDER — OMEPRAZOLE 40 MG PO CPDR
40.0000 mg | DELAYED_RELEASE_CAPSULE | Freq: Two times a day (BID) | ORAL | 0 refills | Status: DC
  Filled 2022-09-13: qty 60, 30d supply, fill #0

## 2022-09-14 ENCOUNTER — Other Ambulatory Visit (HOSPITAL_BASED_OUTPATIENT_CLINIC_OR_DEPARTMENT_OTHER): Payer: Self-pay

## 2022-09-14 MED ORDER — WEGOVY 2.4 MG/0.75ML ~~LOC~~ SOAJ
2.4000 mg | SUBCUTANEOUS | 0 refills | Status: DC
  Filled 2022-09-14: qty 3, 28d supply, fill #0

## 2022-09-17 ENCOUNTER — Other Ambulatory Visit (HOSPITAL_BASED_OUTPATIENT_CLINIC_OR_DEPARTMENT_OTHER): Payer: Self-pay

## 2022-09-17 ENCOUNTER — Encounter: Payer: Self-pay | Admitting: Family Medicine

## 2022-09-17 ENCOUNTER — Ambulatory Visit (INDEPENDENT_AMBULATORY_CARE_PROVIDER_SITE_OTHER): Payer: 59 | Admitting: Family Medicine

## 2022-09-17 DIAGNOSIS — M79622 Pain in left upper arm: Secondary | ICD-10-CM | POA: Diagnosis not present

## 2022-09-17 DIAGNOSIS — R079 Chest pain, unspecified: Secondary | ICD-10-CM | POA: Diagnosis not present

## 2022-09-17 NOTE — Progress Notes (Signed)
   Acute Office Visit  Subjective:     Patient ID: Rose Long, female    DOB: January 27, 1961, 61 y.o.   MRN: [id number]  No chief complaint on file.   HPI Patient is in today for discomfort over the her left side going towards the axilla and towards her back and a little bit anteriorly.  It started about 1 to 2 weeks ago its been gradually getting more uncomfortable.  No nausea vomiting.  No fever.  She had a little bit of nasally symptoms recently but no major illness.  Last mammogram was probably about a year ago.  No recent or injury or trauma to the area though she did have a fall with an injury to that area over the summer when she was traveling.  No recent GI symptoms no shortness of breath. No covid vaccine recently.   ROS      Objective:    There were no vitals taken for this visit.   Physical Exam Vitals reviewed.  Constitutional:      Appearance: She is well-developed.  HENT:     Head: Normocephalic and atraumatic.  Eyes:     Conjunctiva/sclera: Conjunctivae normal.  Pulmonary:     Effort: Pulmonary effort is normal.  Chest:       Comments: She is tender over the left lateral rib area going towards the axilla but not all the way into the axilla.  She has an old surgical scar they are with a little bit of more thickened tissue underneath so difficult to palpate discrete lymphadenopathy.  She also has some epigastric tenderness on exam. Skin:    General: Skin is dry.     Coloration: Skin is not pale.  Neurological:     Mental Status: She is alert and oriented to person, place, and time.  Psychiatric:        Behavior: Behavior normal.     No results found for any visits on 09/17/22.      Assessment & Plan:   Problem List Items Addressed This Visit   None Visit Diagnoses     Left axillary pain    -  Primary   Relevant Orders   US BREAST COMPLETE UNI LEFT INC AXILLA   MM Digital Diagnostic Unilat L   Left-sided chest pain       Relevant Orders   US  BREAST COMPLETE UNI LEFT INC AXILLA   MM Digital Diagnostic Unilat L      Left chest pain.  It is really above the upper quadrant of the abdomen and she is nontender directly over the abdomen she is more tender just over the lower lateral rib cage.  I am concerned about the potential of lymphadenopathy.  So we will get axillary ultrasound and possible diagnostic mammogram on that side for further work-up.  No orders of the defined types were placed in this encounter.   No follow-ups on file.  Beatrice Lecher, MD

## 2022-09-20 ENCOUNTER — Other Ambulatory Visit: Payer: Self-pay | Admitting: Family Medicine

## 2022-09-20 ENCOUNTER — Other Ambulatory Visit (HOSPITAL_BASED_OUTPATIENT_CLINIC_OR_DEPARTMENT_OTHER): Payer: Self-pay

## 2022-09-20 DIAGNOSIS — G43919 Migraine, unspecified, intractable, without status migrainosus: Secondary | ICD-10-CM

## 2022-09-20 DIAGNOSIS — M79622 Pain in left upper arm: Secondary | ICD-10-CM

## 2022-09-20 MED ORDER — SUMATRIPTAN SUCCINATE 100 MG PO TABS
ORAL_TABLET | ORAL | 5 refills | Status: DC
  Filled 2022-09-20: qty 9, 30d supply, fill #0
  Filled 2022-10-16 – 2022-10-22 (×2): qty 9, 30d supply, fill #1
  Filled 2022-11-17: qty 9, 30d supply, fill #2
  Filled 2023-01-06: qty 9, 30d supply, fill #3
  Filled 2023-02-09: qty 9, 30d supply, fill #4
  Filled 2023-03-07: qty 9, 30d supply, fill #5
  Filled 2023-04-11: qty 9, 30d supply, fill #6

## 2022-09-20 MED ORDER — NURTEC 75 MG PO TBDP
75.0000 mg | ORAL_TABLET | ORAL | 1 refills | Status: DC
  Filled 2022-09-20: qty 16, 30d supply, fill #0
  Filled 2022-10-16 – 2022-10-22 (×2): qty 16, 30d supply, fill #1
  Filled 2022-11-17: qty 16, 30d supply, fill #2
  Filled 2023-02-09: qty 16, 30d supply, fill #3
  Filled 2023-03-07: qty 16, 30d supply, fill #4
  Filled 2023-04-11: qty 16, 30d supply, fill #5

## 2022-09-28 ENCOUNTER — Ambulatory Visit
Admission: RE | Admit: 2022-09-28 | Discharge: 2022-09-28 | Disposition: A | Discharge status: Home / Self Care | Payer: 59 | Source: Ambulatory Visit | Attending: Family Medicine | Admitting: Family Medicine

## 2022-09-28 ENCOUNTER — Ambulatory Visit: Payer: 59

## 2022-09-28 DIAGNOSIS — R928 Other abnormal and inconclusive findings on diagnostic imaging of breast: Secondary | ICD-10-CM | POA: Diagnosis not present

## 2022-09-28 DIAGNOSIS — M79622 Pain in left upper arm: Secondary | ICD-10-CM

## 2022-10-07 ENCOUNTER — Other Ambulatory Visit: Payer: Self-pay | Admitting: Family Medicine

## 2022-10-07 ENCOUNTER — Other Ambulatory Visit (HOSPITAL_BASED_OUTPATIENT_CLINIC_OR_DEPARTMENT_OTHER): Payer: Self-pay

## 2022-10-07 MED ORDER — OMEPRAZOLE 40 MG PO CPDR
40.0000 mg | DELAYED_RELEASE_CAPSULE | Freq: Two times a day (BID) | ORAL | 0 refills | Status: DC
  Filled 2022-10-07 – 2022-10-22 (×2): qty 60, 30d supply, fill #0

## 2022-10-07 MED ORDER — WEGOVY 2.4 MG/0.75ML ~~LOC~~ SOAJ
2.4000 mg | SUBCUTANEOUS | 0 refills | Status: DC
  Filled 2022-10-07 – 2022-10-22 (×2): qty 3, 28d supply, fill #0

## 2022-10-18 ENCOUNTER — Other Ambulatory Visit (HOSPITAL_BASED_OUTPATIENT_CLINIC_OR_DEPARTMENT_OTHER): Payer: Self-pay

## 2022-10-19 ENCOUNTER — Other Ambulatory Visit (HOSPITAL_BASED_OUTPATIENT_CLINIC_OR_DEPARTMENT_OTHER): Payer: Self-pay

## 2022-10-19 ENCOUNTER — Other Ambulatory Visit: Payer: Self-pay

## 2022-10-21 ENCOUNTER — Other Ambulatory Visit (HOSPITAL_BASED_OUTPATIENT_CLINIC_OR_DEPARTMENT_OTHER): Payer: Self-pay

## 2022-10-22 ENCOUNTER — Other Ambulatory Visit (HOSPITAL_BASED_OUTPATIENT_CLINIC_OR_DEPARTMENT_OTHER): Payer: Self-pay

## 2022-10-25 ENCOUNTER — Other Ambulatory Visit (HOSPITAL_BASED_OUTPATIENT_CLINIC_OR_DEPARTMENT_OTHER): Payer: Self-pay

## 2022-10-26 ENCOUNTER — Other Ambulatory Visit: Payer: Self-pay

## 2022-11-02 ENCOUNTER — Other Ambulatory Visit (HOSPITAL_BASED_OUTPATIENT_CLINIC_OR_DEPARTMENT_OTHER): Payer: Self-pay

## 2022-11-03 ENCOUNTER — Other Ambulatory Visit (HOSPITAL_BASED_OUTPATIENT_CLINIC_OR_DEPARTMENT_OTHER): Payer: Self-pay

## 2022-11-04 ENCOUNTER — Other Ambulatory Visit (HOSPITAL_BASED_OUTPATIENT_CLINIC_OR_DEPARTMENT_OTHER): Payer: Self-pay

## 2022-11-09 ENCOUNTER — Telehealth: Payer: Self-pay

## 2022-11-09 NOTE — Telephone Encounter (Addendum)
Initiated Prior authorization [id number] 2.4MG/0.75ML auto-injectors via: Covermymeds Case/Key:[id number] Status: denied  as of 11/09/22 Reason:pt did not meet the bmi requirements  Notified Pt via: Pt was called  in regards to the pa

## 2022-11-11 ENCOUNTER — Other Ambulatory Visit (HOSPITAL_BASED_OUTPATIENT_CLINIC_OR_DEPARTMENT_OTHER): Payer: Self-pay

## 2022-11-12 ENCOUNTER — Other Ambulatory Visit (HOSPITAL_BASED_OUTPATIENT_CLINIC_OR_DEPARTMENT_OTHER): Payer: Self-pay

## 2022-11-15 ENCOUNTER — Other Ambulatory Visit (HOSPITAL_BASED_OUTPATIENT_CLINIC_OR_DEPARTMENT_OTHER): Payer: Self-pay

## 2022-11-16 ENCOUNTER — Other Ambulatory Visit (HOSPITAL_BASED_OUTPATIENT_CLINIC_OR_DEPARTMENT_OTHER): Payer: Self-pay

## 2022-11-17 ENCOUNTER — Other Ambulatory Visit (HOSPITAL_BASED_OUTPATIENT_CLINIC_OR_DEPARTMENT_OTHER): Payer: Self-pay

## 2022-11-17 ENCOUNTER — Other Ambulatory Visit: Payer: Self-pay

## 2022-11-17 ENCOUNTER — Encounter (HOSPITAL_BASED_OUTPATIENT_CLINIC_OR_DEPARTMENT_OTHER): Payer: Self-pay

## 2022-11-17 ENCOUNTER — Other Ambulatory Visit: Payer: Self-pay | Admitting: Family Medicine

## 2022-11-17 DIAGNOSIS — R41840 Attention and concentration deficit: Secondary | ICD-10-CM

## 2022-11-18 ENCOUNTER — Other Ambulatory Visit (HOSPITAL_BASED_OUTPATIENT_CLINIC_OR_DEPARTMENT_OTHER): Payer: Self-pay

## 2022-11-18 MED ORDER — AMPHETAMINE-DEXTROAMPHETAMINE 10 MG PO TABS
10.0000 mg | ORAL_TABLET | Freq: Two times a day (BID) | ORAL | 0 refills | Status: DC
  Filled 2022-11-18: qty 180, 90d supply, fill #0

## 2022-11-22 ENCOUNTER — Other Ambulatory Visit (HOSPITAL_BASED_OUTPATIENT_CLINIC_OR_DEPARTMENT_OTHER): Payer: Self-pay

## 2022-11-23 ENCOUNTER — Other Ambulatory Visit (HOSPITAL_BASED_OUTPATIENT_CLINIC_OR_DEPARTMENT_OTHER): Payer: Self-pay

## 2022-11-24 ENCOUNTER — Other Ambulatory Visit (HOSPITAL_BASED_OUTPATIENT_CLINIC_OR_DEPARTMENT_OTHER): Payer: Self-pay

## 2022-11-25 ENCOUNTER — Other Ambulatory Visit (HOSPITAL_BASED_OUTPATIENT_CLINIC_OR_DEPARTMENT_OTHER): Payer: Self-pay

## 2022-11-25 ENCOUNTER — Telehealth: Payer: Self-pay | Admitting: Family Medicine

## 2022-11-25 DIAGNOSIS — G479 Sleep disorder, unspecified: Secondary | ICD-10-CM

## 2022-11-25 MED ORDER — VALACYCLOVIR HCL 1 G PO TABS
1000.0000 mg | ORAL_TABLET | Freq: Three times a day (TID) | ORAL | 0 refills | Status: AC
  Filled 2022-11-25: qty 21, 7d supply, fill #0

## 2022-11-25 MED ORDER — HYDROXYZINE PAMOATE 25 MG PO CAPS
25.0000 mg | ORAL_CAPSULE | Freq: Every evening | ORAL | 3 refills | Status: DC | PRN
  Filled 2022-11-25: qty 60, 30d supply, fill #0
  Filled 2022-12-13 – 2023-01-06 (×2): qty 60, 30d supply, fill #1
  Filled 2023-02-09: qty 60, 30d supply, fill #2
  Filled 2023-03-07: qty 60, 30d supply, fill #3

## 2022-11-25 NOTE — Telephone Encounter (Signed)
She dropped by and noted a small amiss nickel sized cluster of vesicles on her right inner upper arm with an approximately 4 cm band of erythema coming out from the edge of the cluster.  It started a couple of days ago woke up with the redness this morning.  She has had shingles before but on her side.  She also has not been sleeping well.  She has been using an over-the-counter sleep aid but it is not been helping she has tried trazodone in the past and that really does not work well for her she is interested in trying hydroxyzine  Meds ordered this encounter  Medications   hydrOXYzine (VISTARIL) 25 MG capsule    Sig: Take 1-2 capsules (25-50 mg total) by mouth at bedtime as needed.    Dispense:  60 capsule    Refill:  3   valACYclovir (VALTREX) 1000 MG tablet    Sig: Take 1 tablet (1,000 mg total) by mouth 3 (three) times daily for 7 days.    Dispense:  21 tablet    Refill:  0

## 2022-11-29 ENCOUNTER — Other Ambulatory Visit (HOSPITAL_BASED_OUTPATIENT_CLINIC_OR_DEPARTMENT_OTHER): Payer: Self-pay

## 2022-11-30 ENCOUNTER — Other Ambulatory Visit (HOSPITAL_BASED_OUTPATIENT_CLINIC_OR_DEPARTMENT_OTHER): Payer: Self-pay

## 2022-12-02 ENCOUNTER — Other Ambulatory Visit (HOSPITAL_BASED_OUTPATIENT_CLINIC_OR_DEPARTMENT_OTHER): Payer: Self-pay

## 2022-12-06 ENCOUNTER — Other Ambulatory Visit (HOSPITAL_BASED_OUTPATIENT_CLINIC_OR_DEPARTMENT_OTHER): Payer: Self-pay

## 2022-12-06 ENCOUNTER — Telehealth: Payer: Self-pay

## 2022-12-06 ENCOUNTER — Encounter: Payer: Self-pay | Admitting: Family Medicine

## 2022-12-06 NOTE — Telephone Encounter (Signed)
Initiated Prior authorization [id number] 2.4MG/0.75ML auto-injectors  Via: Covermymeds/called med impact Case/Key:[id number] Status: Pending as of 12/06/22 Reason: Notified Pt via: Mychart

## 2022-12-07 ENCOUNTER — Other Ambulatory Visit (HOSPITAL_BASED_OUTPATIENT_CLINIC_OR_DEPARTMENT_OTHER): Payer: Self-pay

## 2022-12-13 ENCOUNTER — Other Ambulatory Visit (HOSPITAL_BASED_OUTPATIENT_CLINIC_OR_DEPARTMENT_OTHER): Payer: Self-pay

## 2022-12-13 ENCOUNTER — Other Ambulatory Visit: Payer: Self-pay

## 2022-12-13 MED ORDER — OMEPRAZOLE 40 MG PO CPDR
40.0000 mg | DELAYED_RELEASE_CAPSULE | Freq: Two times a day (BID) | ORAL | 0 refills | Status: DC
  Filled 2022-12-13: qty 60, 30d supply, fill #0

## 2022-12-30 ENCOUNTER — Other Ambulatory Visit (HOSPITAL_BASED_OUTPATIENT_CLINIC_OR_DEPARTMENT_OTHER): Payer: Self-pay

## 2023-01-06 ENCOUNTER — Other Ambulatory Visit (HOSPITAL_BASED_OUTPATIENT_CLINIC_OR_DEPARTMENT_OTHER): Payer: Self-pay

## 2023-01-06 ENCOUNTER — Telehealth: Payer: Self-pay | Admitting: Family Medicine

## 2023-01-06 MED ORDER — VALACYCLOVIR HCL 1 G PO TABS
1000.0000 mg | ORAL_TABLET | Freq: Three times a day (TID) | ORAL | 0 refills | Status: AC
  Filled 2023-01-06: qty 21, 7d supply, fill #0

## 2023-01-06 NOTE — Telephone Encounter (Signed)
Has a recurring rash on the right inner arm that is uncomfortable and feels like the pain is radiating around the arm though the rash is in a smaller spot. It is erythematous macular papular rash.  Husband has shingles right now. Will tx with acyclovir.   Meds ordered this encounter  Medications   valACYclovir (VALTREX) 1000 MG tablet    Sig: Take 1 tablet (1,000 mg total) by mouth 3 (three) times daily for 7 days.    Dispense:  21 tablet    Refill:  0

## 2023-02-02 ENCOUNTER — Other Ambulatory Visit (HOSPITAL_BASED_OUTPATIENT_CLINIC_OR_DEPARTMENT_OTHER): Payer: Self-pay

## 2023-02-02 MED ORDER — AMOXICILLIN 875 MG PO TABS
875.0000 mg | ORAL_TABLET | Freq: Two times a day (BID) | ORAL | 0 refills | Status: DC
  Filled 2023-02-02 (×2): qty 14, 7d supply, fill #0

## 2023-02-03 ENCOUNTER — Other Ambulatory Visit (HOSPITAL_BASED_OUTPATIENT_CLINIC_OR_DEPARTMENT_OTHER): Payer: Self-pay

## 2023-02-03 MED ORDER — NYSTATIN-TRIAMCINOLONE 100000-0.1 UNIT/GM-% EX OINT
TOPICAL_OINTMENT | CUTANEOUS | 0 refills | Status: DC
  Filled 2023-02-03: qty 30, 15d supply, fill #0

## 2023-02-09 ENCOUNTER — Other Ambulatory Visit: Payer: Self-pay

## 2023-02-09 ENCOUNTER — Other Ambulatory Visit (HOSPITAL_BASED_OUTPATIENT_CLINIC_OR_DEPARTMENT_OTHER): Payer: Self-pay

## 2023-02-09 ENCOUNTER — Other Ambulatory Visit: Payer: Self-pay | Admitting: Family Medicine

## 2023-02-09 DIAGNOSIS — F411 Generalized anxiety disorder: Secondary | ICD-10-CM

## 2023-02-09 MED ORDER — NYSTATIN 100000 UNIT/GM EX CREA
TOPICAL_CREAM | CUTANEOUS | 0 refills | Status: DC
  Filled 2023-02-09: qty 15, 15d supply, fill #0

## 2023-02-09 MED ORDER — SERTRALINE HCL 100 MG PO TABS
100.0000 mg | ORAL_TABLET | Freq: Every day | ORAL | 1 refills | Status: DC
  Filled 2023-02-09: qty 90, 90d supply, fill #0
  Filled 2023-03-07 – 2023-05-11 (×3): qty 90, 90d supply, fill #1

## 2023-02-09 MED ORDER — WEGOVY 2.4 MG/0.75ML ~~LOC~~ SOAJ
2.4000 mg | SUBCUTANEOUS | 0 refills | Status: DC
  Filled 2023-02-09: qty 3, 28d supply, fill #0

## 2023-02-10 ENCOUNTER — Other Ambulatory Visit (HOSPITAL_BASED_OUTPATIENT_CLINIC_OR_DEPARTMENT_OTHER): Payer: Self-pay

## 2023-03-07 ENCOUNTER — Other Ambulatory Visit (HOSPITAL_BASED_OUTPATIENT_CLINIC_OR_DEPARTMENT_OTHER): Payer: Self-pay

## 2023-03-07 ENCOUNTER — Other Ambulatory Visit: Payer: Self-pay

## 2023-04-04 DIAGNOSIS — L820 Inflamed seborrheic keratosis: Secondary | ICD-10-CM | POA: Diagnosis not present

## 2023-04-04 DIAGNOSIS — L578 Other skin changes due to chronic exposure to nonionizing radiation: Secondary | ICD-10-CM | POA: Diagnosis not present

## 2023-04-04 DIAGNOSIS — L814 Other melanin hyperpigmentation: Secondary | ICD-10-CM | POA: Diagnosis not present

## 2023-04-04 DIAGNOSIS — L570 Actinic keratosis: Secondary | ICD-10-CM | POA: Diagnosis not present

## 2023-04-04 DIAGNOSIS — W908XXS Exposure to other nonionizing radiation, sequela: Secondary | ICD-10-CM | POA: Diagnosis not present

## 2023-04-11 ENCOUNTER — Other Ambulatory Visit (HOSPITAL_BASED_OUTPATIENT_CLINIC_OR_DEPARTMENT_OTHER): Payer: Self-pay

## 2023-04-11 ENCOUNTER — Other Ambulatory Visit: Payer: Self-pay | Admitting: Family Medicine

## 2023-04-11 DIAGNOSIS — R41840 Attention and concentration deficit: Secondary | ICD-10-CM

## 2023-04-11 DIAGNOSIS — G479 Sleep disorder, unspecified: Secondary | ICD-10-CM

## 2023-04-12 ENCOUNTER — Other Ambulatory Visit: Payer: Self-pay | Admitting: Family Medicine

## 2023-04-12 ENCOUNTER — Other Ambulatory Visit: Payer: Self-pay

## 2023-04-12 ENCOUNTER — Other Ambulatory Visit (HOSPITAL_BASED_OUTPATIENT_CLINIC_OR_DEPARTMENT_OTHER): Payer: Self-pay

## 2023-04-12 DIAGNOSIS — G43919 Migraine, unspecified, intractable, without status migrainosus: Secondary | ICD-10-CM

## 2023-04-12 MED ORDER — SUMATRIPTAN SUCCINATE 100 MG PO TABS
ORAL_TABLET | ORAL | 5 refills | Status: DC
Start: 2023-04-12 — End: 2023-10-24
  Filled 2023-04-12: qty 9, 30d supply, fill #0
  Filled 2023-05-11: qty 9, 30d supply, fill #1
  Filled 2023-06-02 – 2023-06-06 (×2): qty 9, 30d supply, fill #2
  Filled 2023-07-20: qty 9, 30d supply, fill #3
  Filled 2023-08-03 – 2023-08-15 (×2): qty 9, 30d supply, fill #4
  Filled 2023-09-17: qty 9, 30d supply, fill #5
  Filled 2023-10-24: qty 6, 18d supply, fill #6

## 2023-04-12 MED ORDER — WEGOVY 2.4 MG/0.75ML ~~LOC~~ SOAJ
2.4000 mg | SUBCUTANEOUS | 0 refills | Status: DC
  Filled 2023-04-12 – 2023-05-11 (×2): qty 3, 28d supply, fill #0

## 2023-04-12 MED ORDER — AMPHETAMINE-DEXTROAMPHETAMINE 10 MG PO TABS
10.0000 mg | ORAL_TABLET | Freq: Two times a day (BID) | ORAL | 0 refills | Status: DC
  Filled 2023-04-12: qty 180, 90d supply, fill #0

## 2023-04-12 MED ORDER — HYDROXYZINE PAMOATE 25 MG PO CAPS
25.0000 mg | ORAL_CAPSULE | Freq: Every evening | ORAL | 3 refills | Status: DC | PRN
  Filled 2023-04-12: qty 60, 30d supply, fill #0
  Filled 2023-05-11: qty 60, 30d supply, fill #1
  Filled 2023-06-02 – 2023-06-06 (×2): qty 60, 30d supply, fill #2
  Filled 2023-08-03: qty 60, 30d supply, fill #3

## 2023-04-12 MED ORDER — NURTEC 75 MG PO TBDP
75.0000 mg | ORAL_TABLET | ORAL | 1 refills | Status: DC
Start: 2023-04-12 — End: 2024-01-13
  Filled 2023-04-12: qty 15, 30d supply, fill #0
  Filled 2023-05-11: qty 15, 30d supply, fill #1
  Filled 2023-06-10: qty 15, 30d supply, fill #2
  Filled 2023-08-22: qty 15, 30d supply, fill #3
  Filled 2023-09-17: qty 15, 30d supply, fill #4
  Filled 2023-10-21: qty 15, 30d supply, fill #5

## 2023-04-12 NOTE — Telephone Encounter (Signed)
Requesting rx rf of  sumatriptan100mg  - lastwritten 09/20/2022 Nurtec 75mg   last written 09/20/2022  Last OV 09/17/2022 No upcoming appt shcld.

## 2023-04-12 NOTE — Telephone Encounter (Signed)
Reqeusting rx rf of  Adderall 10mg   last written 11/21/2022  Hydroxyzine 25mg  last wrottem 11/25/2022 Wegovy 2.4mg  last written 02/09/2023 Last OV 11/18/2021 No upcoming appt schld.

## 2023-04-13 ENCOUNTER — Other Ambulatory Visit (HOSPITAL_BASED_OUTPATIENT_CLINIC_OR_DEPARTMENT_OTHER): Payer: Self-pay

## 2023-04-15 ENCOUNTER — Other Ambulatory Visit (HOSPITAL_BASED_OUTPATIENT_CLINIC_OR_DEPARTMENT_OTHER): Payer: Self-pay

## 2023-05-11 ENCOUNTER — Other Ambulatory Visit: Payer: Self-pay

## 2023-05-11 ENCOUNTER — Other Ambulatory Visit: Payer: Self-pay | Admitting: Oncology

## 2023-05-11 ENCOUNTER — Other Ambulatory Visit (HOSPITAL_BASED_OUTPATIENT_CLINIC_OR_DEPARTMENT_OTHER): Payer: Self-pay

## 2023-05-11 DIAGNOSIS — Z006 Encounter for examination for normal comparison and control in clinical research program: Secondary | ICD-10-CM

## 2023-05-11 MED ORDER — OMEPRAZOLE 40 MG PO CPDR
40.0000 mg | DELAYED_RELEASE_CAPSULE | ORAL | 0 refills | Status: DC
  Filled 2023-05-11: qty 60, 30d supply, fill #0

## 2023-05-16 ENCOUNTER — Other Ambulatory Visit (HOSPITAL_BASED_OUTPATIENT_CLINIC_OR_DEPARTMENT_OTHER): Payer: Self-pay

## 2023-06-01 ENCOUNTER — Ambulatory Visit (INDEPENDENT_AMBULATORY_CARE_PROVIDER_SITE_OTHER): Payer: Commercial Managed Care - PPO | Admitting: Physician Assistant

## 2023-06-01 ENCOUNTER — Other Ambulatory Visit (HOSPITAL_BASED_OUTPATIENT_CLINIC_OR_DEPARTMENT_OTHER): Payer: Self-pay

## 2023-06-01 ENCOUNTER — Encounter: Payer: Self-pay | Admitting: Physician Assistant

## 2023-06-01 VITALS — BP 132/82 | HR 74 | Wt 202.0 lb

## 2023-06-01 DIAGNOSIS — Z9884 Bariatric surgery status: Secondary | ICD-10-CM

## 2023-06-01 DIAGNOSIS — E6609 Other obesity due to excess calories: Secondary | ICD-10-CM | POA: Diagnosis not present

## 2023-06-01 DIAGNOSIS — M25551 Pain in right hip: Secondary | ICD-10-CM

## 2023-06-01 DIAGNOSIS — Z0000 Encounter for general adult medical examination without abnormal findings: Secondary | ICD-10-CM

## 2023-06-01 DIAGNOSIS — Z6839 Body mass index (BMI) 39.0-39.9, adult: Secondary | ICD-10-CM | POA: Diagnosis not present

## 2023-06-01 DIAGNOSIS — F411 Generalized anxiety disorder: Secondary | ICD-10-CM | POA: Diagnosis not present

## 2023-06-01 MED ORDER — LORAZEPAM 0.5 MG PO TABS
0.5000 mg | ORAL_TABLET | Freq: Three times a day (TID) | ORAL | 0 refills | Status: DC
Start: 2023-06-01 — End: 2023-08-22
  Filled 2023-06-01: qty 30, 10d supply, fill #0

## 2023-06-01 MED ORDER — NALTREXONE-BUPROPION HCL ER 8-90 MG PO TB12
ORAL_TABLET | ORAL | 0 refills | Status: DC
Start: 2023-06-01 — End: 2023-08-03
  Filled 2023-06-01: qty 80, 30d supply, fill #0

## 2023-06-01 NOTE — Progress Notes (Signed)
Complete physical exam  Patient: Rose Long   DOB: 12-24-1960   62 y.o. Female  MRN: [id number]  Subjective:    No chief complaint on file.   Rose Long is a 62 y.o. female who presents today for a complete physical exam. She reports consuming a general diet. The patient does not participate in regular exercise at present. She generally feels fairly well. She reports sleeping fairly well. She does have additional problems to discuss today.   Her anxiety has been worsening for the last year. She has more times than usual that she feels overwhelmed. She had ativan that she was using as needed and just ran out. She would like refill. She is working on other ways to control anxiety as well.   She is frustrated with weight. She was on wegovy and doing GREAT but insurance would not pay and now she is off and gaining weight. She wonders what she can do. Hx of gastric bypass about 13 years ago.   She is having some right hip and anterior thigh pain for the last few months. Seems like getting worse. Notices it more when getting in and out of car and going up steps.  Not doing anything to make better. No injury.   Most recent fall risk assessment:    05/05/2022    4:10 PM  Fall Risk   Falls in the past year? 1  Number falls in past yr: 0  Injury with Fall? 0  Risk for fall due to : Other (Comment)  Risk for fall due to: Comment uneven pavement,low light  Follow up Falls prevention discussed     Most recent depression screenings:    06/03/2023    7:28 AM 03/18/2022    1:23 PM  PHQ 2/9 Scores  PHQ - 2 Score 0 0  PHQ- 9 Score  3    Vision:Within last year and Dental: No current dental problems and Receives regular dental care  Patient Active Problem List   Diagnosis Date Noted   Hidradenitis suppurativa 03/19/2022   Counseling for travel 11/27/2021   Dysphasia 05/22/2021   Encounter for weight management 02/19/2021   Elevated BP without diagnosis of hypertension  10/13/2020   ADHD 08/18/2020   Pernicious anemia 08/18/2020   GAD (generalized anxiety disorder) 08/18/2020   Vitamin D deficiency 10/23/2019   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   Situational anxiety 10/23/2019   BMI 33.0-33.9,adult 10/23/2019   Hand arthritis 10/23/2019   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   Restless legs syndrome 07/19/2016   History of left knee replacement 11/25/2015   Primary osteoarthritis of left wrist 07/28/2015   Hemorrhoids 06/12/2014   Anemia 06/12/2014   Abnormal facial hair 08/03/2013   Trochanteric bursitis of both hips 06/12/2013   Low back pain 05/21/2013   Primary osteoarthritis of right knee 05/04/2013   Migraine 11/23/2011   Insomnia 08/06/2011   Obesity 08/06/2011   Rheumatoid arthritis (HCC) 08/15/2008   Past Medical History:  Diagnosis Date   Anemia    Arthritis    Bronchitis 2019   Complication of anesthesia    PONV   Family history of adverse reaction to anesthesia    pt's mother stopped breathing possibly due to sleep apnea   Hiatal hernia    Migraine    Obesity    Simple ovarian cyst    Right   Past Surgical History:  Procedure Laterality Date   ABDOMINOPLASTY/PANNICULECTOMY     BREAST  SURGERY  1998   reduction   BREAST SURGERY  10/2013   Lift, tummy tuck   NASAL SINUS SURGERY  1999   REDUCTION MAMMAPLASTY  1998   REDUCTION MAMMAPLASTY  2017   Breast lift   ROUX-EN-Y PROCEDURE  02/2010   TOTAL KNEE ARTHROPLASTY Left 09/30/2015   Procedure: TOTAL KNEE ARTHROPLASTY;  Surgeon: Marcene Corning, MD;  Location: [facility] OR;  Service: Orthopedics;  Laterality: Left;   under arm skin removed     uterine ablation  2012   Allergies  Allergen Reactions   Sulfa Antibiotics Anaphylaxis   Sulfamethoxazole-Trimethoprim Anaphylaxis, Swelling and Rash    Redness and swelling   Bee Venom    Trazodone And Nefazodone     headache      Patient Care Team: Agapito Games, MD  as PCP - General (Family Medicine) Santiago Glad, MD as Referring Physician (Specialist)   Outpatient Medications Prior to Visit  Medication Sig   acetaminophen (TYLENOL) 650 MG CR tablet Take 1 tablet (650 mg total) by mouth every 8 (eight) hours as needed for pain.   amphetamine-dextroamphetamine (ADDERALL) 10 MG tablet Take 1 tablet (10 mg total) by mouth 2 (two) times daily.   amphetamine-dextroamphetamine (ADDERALL) 10 MG tablet Take 1 tablet (10 mg total) by mouth 2 (two) times daily.   amphetamine-dextroamphetamine (ADDERALL) 10 MG tablet Take 1 tablet (10 mg total) by mouth 2 (two) times daily with a meal.   calcium citrate-vitamin D 200-200 MG-UNIT TABS Take 1 tablet by mouth daily.   Cholecalciferol 1.25 MG (50000 UT) capsule Take 1 capsule (50,000 Units total) by mouth once a week.   ferrous sulfate (FEROSUL) 325 (65 FE) MG tablet Take 1 tablet (325 mg total) by mouth daily with breakfast.   hydrOXYzine (VISTARIL) 25 MG capsule Take 1-2 capsules (25-50 mg total) by mouth at bedtime as needed.   MAGNESIUM CITRATE PO Take 1 tablet by mouth 2 (two) times daily.    Multiple Vitamin (MULTIVITAMIN) tablet Take 1 tablet by mouth daily.   nystatin-triamcinolone ointment (MYCOLOG) Apply sparingly 2 to 4 times a day.   omeprazole (PRILOSEC) 40 MG capsule Take 1 capsule (40 mg total) by mouth in the morning and at bedtime.   Rimegepant Sulfate (NURTEC) 75 MG TBDP Take 1 tablet (75 mg) by mouth every other day.   sertraline (ZOLOFT) 100 MG tablet Take 1 tablet (100 mg total) by mouth daily.   SUMAtriptan (IMITREX) 100 MG tablet TAKE 1 TABLET BY MOUTH EVERY 2 HOURS AS NEEDED FOR MIGRAINE. MAY REPEAT IN 2 HOURS IF HEADACHE PERSISTS OR RECURS. MAX OF 2 TABLETS IN ONE DAY   [DISCONTINUED] omeprazole (PRILOSEC) 40 MG capsule Take 40 mg by mouth 2 (two) times daily.   [DISCONTINUED] amoxicillin (AMOXIL) 875 MG tablet Take 1 tablet (875 mg total) by mouth 2 (two) times daily until gone    [DISCONTINUED] ondansetron (ZOFRAN) 4 MG tablet Take 1 tablet (4 mg total) by mouth every 8 (eight) hours as needed for nausea or vomiting.   [DISCONTINUED] Semaglutide-Weight Management (WEGOVY) 2.4 MG/0.75ML SOAJ Inject 2.4 mg into the skin every 7 (seven) days. Needs appt   No facility-administered medications prior to visit.    ROS  See HPI.       Objective:     BP 132/82   Pulse 74   Wt 202 lb (91.6 kg)   SpO2 99%   BMI 33.10 kg/m  BP Readings from Last 3 Encounters:  06/01/23 132/82  05/05/22 136/80  03/18/22 136/72   Wt Readings from Last 3 Encounters:  06/01/23 202 lb (91.6 kg)  05/05/22 202 lb (91.6 kg)  03/18/22 210 lb (95.3 kg)       Physical Exam  BP 132/82   Pulse 74   Wt 202 lb (91.6 kg)   SpO2 99%   BMI 33.10 kg/m   General Appearance:    Alert, cooperative, obese no distress, appears stated age  Head:    Normocephalic, without obvious abnormality, atraumatic  Eyes:    PERRL, conjunctiva/corneas clear, EOM's intact, fundi    benign, both eyes  Ears:    Normal TM's and external ear canals, both ears  Nose:   Nares normal, septum midline, mucosa normal, no drainage    or sinus tenderness  Throat:   Lips, mucosa, and tongue normal; teeth and gums normal  Neck:   Supple, symmetrical, trachea midline, no adenopathy;    thyroid:  no enlargement/tenderness/nodules; no carotid   bruit or JVD  Back:     Symmetric, no curvature, ROM normal, no CVA tenderness  Lungs:     Clear to auscultation bilaterally, respirations unlabored  Chest Wall:    No tenderness or deformity   Heart:    Regular rate and rhythm, S1 and S2 normal, no murmur, rub   or gallop     Abdomen:     Soft, non-tender, bowel sounds active all four quadrants,    no masses, no organomegaly        Extremities:   Extremities normal, atraumatic, no cyanosis or edema  Pulses:   2+ and symmetric all extremities  Skin:   Skin color, texture, turgor normal, no rashes or lesions  Lymph nodes:    Cervical, supraclavicular, and axillary nodes normal  Neurologic:   normal strength   Right hip tenderness to palpation over greater trochanter to palpation    Assessment & Plan:    Routine Health Maintenance and Physical Exam  Immunization History  Administered Date(s) Administered   Fluad Quad(high Dose 65+) 07/04/2019   Hep A / Hep B 07/03/2018, 07/10/2018, 07/24/2018   Influenza Split 07/18/2012   Influenza,inj,Quad PF,6+ Mos 08/15/2014, 07/31/2016, 08/18/2017, 06/26/2018, 08/01/2020   Influenza-Unspecified 07/19/2015, 08/01/2021   PFIZER Comirnaty(Gray Top)Covid-19 Tri-Sucrose Vaccine 06/12/2021   PFIZER(Purple Top)SARS-COV-2 Vaccination 10/15/2019, 11/16/2019, 07/26/2020   PNEUMOCOCCAL CONJUGATE-20 03/18/2022   Pneumococcal Polysaccharide-23 10/01/2015   Tdap 11/17/2012, 03/18/2022   Typhoid Live 07/03/2018   Zoster Recombinant(Shingrix) 05/22/2021, 11/27/2021    Health Maintenance  Topic Date Due   COVID-19 Vaccine (5 - 2023-24 season) 06/19/2023 (Originally 06/18/2022)   INFLUENZA VACCINE  01/16/2024 (Originally 05/19/2023)   Colonoscopy  11/18/2023   MAMMOGRAM  09/28/2024   PAP SMEAR-Modifier  03/24/2025   DTaP/Tdap/Td (3 - Td or Tdap) 03/18/2032   Hepatitis C Screening  Completed   HIV Screening  Completed   Zoster Vaccines- Shingrix  Completed   Pneumococcal Vaccine 99-2 Years old  Aged Out   HPV VACCINES  Aged Out    Discussed health benefits of physical activity, and encouraged her to engage in regular exercise appropriate for her age and condition. Marland Kitchen.Diagnoses and all orders for this visit:  Routine general medical examination at a health care facility -     CBC w/Diff/Platelet -     Fe+TIBC+Fer -     CMP14+EGFR -     DG Hip Unilat W OR W/O Pelvis 2-3 Views Right; Future -     Magnesium -     B12 and Folate Panel -  Vitamin B6 -     Vitamin B1 -     VITAMIN D 25 Hydroxy (Vit-D Deficiency, Fractures) -     Lipid panel  History of Roux-en-Y gastric  bypass -     CBC w/Diff/Platelet -     Fe+TIBC+Fer -     CMP14+EGFR -     DG Hip Unilat W OR W/O Pelvis 2-3 Views Right; Future -     Naltrexone-buPROPion HCl ER 8-90 MG TB12; Take 1 tab by mouth daily for week 1, then 1 tab twice a day for week 2, then 2 tabs by mouth every morning and 1 tab by mouth every evening for week 3, then 2 tabs twice daily thereafter -     Magnesium -     B12 and Folate Panel -     Vitamin B6 -     Vitamin B1 -     VITAMIN D 25 Hydroxy (Vit-D Deficiency, Fractures) -     Lipid panel -     Amb Ref to Medical Weight Management  Class 2 obesity due to excess calories without serious comorbidity with body mass index (BMI) of 39.0 to 39.9 in adult -     Naltrexone-buPROPion HCl ER 8-90 MG TB12; Take 1 tab by mouth daily for week 1, then 1 tab twice a day for week 2, then 2 tabs by mouth every morning and 1 tab by mouth every evening for week 3, then 2 tabs twice daily thereafter -     Amb Ref to Medical Weight Management  Right hip pain  GAD (generalized anxiety disorder) -     LORazepam (ATIVAN) 0.5 MG tablet; Take 1 tablet (0.5 mg total) by mouth every 8 (eight) hours.   .. Discussed 150 minutes of exercise a week.  Encouraged vitamin D 1000 units and Calcium 1300mg  or 4 servings of dairy a day.  PHQ no concerns Some increase in anxiety discussed PRN options Sent ativan to use sparingly, follow up with PCP as needed Discussed increasing zoloft to 150mg  daily, did not make change today Mammogram UTD Colonoscopy UTD Pap UTD Just had covid infection will wait on covid booster Pt will get flu shot at work  Hx of gastric bypass-labs ordered for management Discussed weight loss with diet and exercise Did GREAT with wegovy but insurance stopped paying for it [facility] Discussed contrave trial Discussed SE Ok with healthy weight and wellness referral  Right hip pain will get xrays tenderness over lateral hip could be  bursitis Ok to use NSAIDS and ice as needed Exercises given to start and can follow up with PCP or Dr. Shellia Cleverly, PA-C

## 2023-06-01 NOTE — Patient Instructions (Addendum)
Contrave for weight loss Get labs Get xray of left hip Referral to healthy weight loss [facility] Ativan for as needed use for anxiety  Health Maintenance, Female Adopting a healthy lifestyle and getting preventive care are important in promoting health and wellness. Ask your health care provider about: The right schedule for you to have regular tests and exams. Things you can do on your own to prevent diseases and keep yourself healthy. What should I know about diet, weight, and exercise? Eat a healthy diet  Eat a diet that includes plenty of vegetables, fruits, low-fat dairy products, and lean protein. Do not eat a lot of foods that are high in solid fats, added sugars, or sodium. Maintain a healthy weight Body mass index (BMI) is used to identify weight problems. It estimates body fat based on height and weight. Your health care provider can help determine your BMI and help you achieve or maintain a healthy weight. Get regular exercise Get regular exercise. This is one of the most important things you can do for your health. Most adults should: Exercise for at least 150 minutes each week. The exercise should increase your heart rate and make you sweat (moderate-intensity exercise). Do strengthening exercises at least twice a week. This is in addition to the moderate-intensity exercise. Spend less time sitting. Even light physical activity can be beneficial. Watch cholesterol and blood lipids Have your blood tested for lipids and cholesterol at 61 years of age, then have this test every 5 years. Have your cholesterol levels checked more often if: Your lipid or cholesterol levels are high. You are older than 61 years of age. You are at high risk for heart disease. What should I know about cancer screening? Depending on your health history and family history, you may need to have cancer screening at various ages. This may include screening for: Breast  cancer. Cervical cancer. Colorectal cancer. Skin cancer. Lung cancer. What should I know about heart disease, diabetes, and high blood pressure? Blood pressure and heart disease High blood pressure causes heart disease and increases the risk of stroke. This is more likely to develop in people who have high blood pressure readings or are overweight. Have your blood pressure checked: Every 3-5 years if you are 54-33 years of age. Every year if you are 30 years old or older. Diabetes Have regular diabetes screenings. This checks your fasting blood sugar level. Have the screening done: Once every three years after age 25 if you are at a normal weight and have a low risk for diabetes. More often and at a younger age if you are overweight or have a high risk for diabetes. What should I know about preventing infection? Hepatitis B If you have a higher risk for hepatitis B, you should be screened for this virus. Talk with your health care provider to find out if you are at risk for hepatitis B infection. Hepatitis C Testing is recommended for: Everyone born from [birth year] through 1964. Anyone with known risk factors for hepatitis C. Sexually transmitted infections (STIs) Get screened for STIs, including gonorrhea and chlamydia, if: You are sexually active and are younger than 61 years of age. You are older than 60 years of age and your health care provider tells you that you are at risk for this type of infection. Your sexual activity has changed since you were last screened, and you are at increased risk for chlamydia or gonorrhea. Ask your health care provider if you  are at risk. Ask your health care provider about whether you are at high risk for HIV. Your health care provider may recommend a prescription medicine to help prevent HIV infection. If you choose to take medicine to prevent HIV, you should first get tested for HIV. You should then be tested every 3 months for as long as you are taking  the medicine. Pregnancy If you are about to stop having your period (premenopausal) and you may become pregnant, seek counseling before you get pregnant. Take 400 to 800 micrograms (mcg) of folic acid every day if you become pregnant. Ask for birth control (contraception) if you want to prevent pregnancy. Osteoporosis and menopause Osteoporosis is a disease in which the bones lose minerals and strength with aging. This can result in bone fractures. If you are 37 years old or older, or if you are at risk for osteoporosis and fractures, ask your health care provider if you should: Be screened for bone loss. Take a calcium or vitamin D supplement to lower your risk of fractures. Be given hormone replacement therapy (HRT) to treat symptoms of menopause. Follow these instructions at home: Alcohol use Do not drink alcohol if: Your health care provider tells you not to drink. You are pregnant, may be pregnant, or are planning to become pregnant. If you drink alcohol: Limit how much you have to: 0-1 drink a day. Know how much alcohol is in your drink. In the [place] one drink equals one 12 oz bottle of beer (355 mL), one 5 oz glass of wine (148 mL), or one 1 oz glass of hard liquor (44 mL). Lifestyle Do not use any products that contain nicotine or tobacco. These products include cigarettes, chewing tobacco, and vaping devices, such as e-cigarettes. If you need help quitting, ask your health care provider. Do not use street drugs. Do not share needles. Ask your health care provider for help if you need support or information about quitting drugs. General instructions Schedule regular health, dental, and eye exams. Stay current with your vaccines. Tell your health care provider if: You often feel depressed. You have ever been abused or do not feel safe at home. Summary Adopting a healthy lifestyle and getting preventive care are important in promoting health and wellness. Follow your health  care provider's instructions about healthy diet, exercising, and getting tested or screened for diseases. Follow your health care provider's instructions on monitoring your cholesterol and blood pressure. This information is not intended to replace advice given to you by your health care provider. Make sure you discuss any questions you have with your health care provider. Document Revised: 02/23/2021 Document Reviewed: 02/23/2021 Elsevier Patient Education  2024 [employer].

## 2023-06-02 ENCOUNTER — Other Ambulatory Visit: Payer: Self-pay

## 2023-06-02 ENCOUNTER — Other Ambulatory Visit (HOSPITAL_BASED_OUTPATIENT_CLINIC_OR_DEPARTMENT_OTHER): Payer: Self-pay

## 2023-06-02 DIAGNOSIS — Z0000 Encounter for general adult medical examination without abnormal findings: Secondary | ICD-10-CM | POA: Diagnosis not present

## 2023-06-02 DIAGNOSIS — Z9884 Bariatric surgery status: Secondary | ICD-10-CM | POA: Diagnosis not present

## 2023-06-03 ENCOUNTER — Encounter: Payer: Self-pay | Admitting: Physician Assistant

## 2023-06-03 ENCOUNTER — Other Ambulatory Visit (HOSPITAL_BASED_OUTPATIENT_CLINIC_OR_DEPARTMENT_OTHER): Payer: Self-pay

## 2023-06-03 MED ORDER — AMBULATORY NON FORMULARY MEDICATION
1 refills | Status: DC
Start: 2023-06-03 — End: 2024-03-08

## 2023-06-06 ENCOUNTER — Other Ambulatory Visit (HOSPITAL_BASED_OUTPATIENT_CLINIC_OR_DEPARTMENT_OTHER): Payer: Self-pay

## 2023-06-06 NOTE — Progress Notes (Signed)
Route to PCP

## 2023-06-06 NOTE — Progress Notes (Signed)
Hi Rose Long, unfortunately your hemoglobin is down and you are anemic.  It looks like your iron is low in particular.  Are you currently taking any extra iron supplementation?  Vitamin D is still low as well.  I think in the past we did the once weekly capsules we can go back to that or you can start over-the-counter vitamin D with 25 mcg daily.  LDL cholesterol is elevated.  Overall cardiovascular risk is low so continue to work on healthy diet and regular exercise.  Folate is normal.  Vitamin B12 is normal but on the lower end so would recommend a multivitamin that has B12 in it.  Magnesium is normal.  And vitamin B1 is normal.  The 10-year ASCVD risk score (Arnett DK, et al., 2019) is: 3.4%   Values used to calculate the score:     Age: 61 years     Sex: Female     Is Non-Hispanic African American: No     Diabetic: No     Tobacco smoker: No     Systolic Blood Pressure: 132 mmHg     Is BP treated: No     HDL Cholesterol: 70 mg/dL     Total Cholesterol: 235 mg/dL

## 2023-06-07 ENCOUNTER — Other Ambulatory Visit (HOSPITAL_BASED_OUTPATIENT_CLINIC_OR_DEPARTMENT_OTHER): Payer: Self-pay

## 2023-06-08 ENCOUNTER — Other Ambulatory Visit (HOSPITAL_BASED_OUTPATIENT_CLINIC_OR_DEPARTMENT_OTHER): Payer: Self-pay

## 2023-06-09 ENCOUNTER — Other Ambulatory Visit (HOSPITAL_BASED_OUTPATIENT_CLINIC_OR_DEPARTMENT_OTHER): Payer: Self-pay

## 2023-06-09 ENCOUNTER — Telehealth: Payer: Self-pay | Admitting: Family Medicine

## 2023-06-09 DIAGNOSIS — D510 Vitamin B12 deficiency anemia due to intrinsic factor deficiency: Secondary | ICD-10-CM

## 2023-06-09 DIAGNOSIS — E559 Vitamin D deficiency, unspecified: Secondary | ICD-10-CM

## 2023-06-09 MED ORDER — CHOLECALCIFEROL 1.25 MG (50000 UT) PO CAPS
50000.0000 [IU] | ORAL_CAPSULE | ORAL | 3 refills | Status: AC
  Filled 2023-06-09: qty 12, 84d supply, fill #0
  Filled 2023-10-24: qty 12, 84d supply, fill #1
  Filled 2024-02-15: qty 12, 84d supply, fill #2

## 2023-06-09 NOTE — Telephone Encounter (Signed)
Based on recent lab salts will send in prescription for vitamin D would prefer to do once weekly tab.  Had previously done B12 injections and would like to do that instead of oral supplementation.  Will go ahead and do a loading dose.  B12 1000 mcg weekly for 4 weeks and then every 2 weeks for a month and then monthly.

## 2023-06-09 NOTE — Telephone Encounter (Signed)
Please initial PA on Contrave. Thank you!!!!

## 2023-06-10 ENCOUNTER — Other Ambulatory Visit (HOSPITAL_BASED_OUTPATIENT_CLINIC_OR_DEPARTMENT_OTHER): Payer: Self-pay

## 2023-06-10 ENCOUNTER — Other Ambulatory Visit: Payer: Self-pay | Admitting: Family Medicine

## 2023-06-12 LAB — CMP14+EGFR
ALT: 14 IU/L (ref 0–32)
AST: 19 IU/L (ref 0–40)
Albumin: 4 g/dL (ref 3.8–4.9)
Alkaline Phosphatase: 92 IU/L (ref 44–121)
BUN/Creatinine Ratio: 17 (ref 12–28)
BUN: 14 mg/dL (ref 8–27)
Bilirubin Total: 0.3 mg/dL (ref 0.0–1.2)
CO2: 22 mmol/L (ref 20–29)
Calcium: 9.7 mg/dL (ref 8.7–10.3)
Chloride: 104 mmol/L (ref 96–106)
Creatinine, Ser: 0.82 mg/dL (ref 0.57–1.00)
Globulin, Total: 3.1 g/dL (ref 1.5–4.5)
Glucose: 85 mg/dL (ref 70–99)
Potassium: 4.4 mmol/L (ref 3.5–5.2)
Sodium: 141 mmol/L (ref 134–144)
Total Protein: 7.1 g/dL (ref 6.0–8.5)
eGFR: 82 mL/min/{1.73_m2} (ref >59)

## 2023-06-12 LAB — CBC WITH DIFFERENTIAL/PLATELET
Basophils Absolute: 0.1 10*3/uL (ref 0.0–0.2)
Basos: 1 %
EOS (ABSOLUTE): 0.1 10*3/uL (ref 0.0–0.4)
Eos: 2 %
Hematocrit: 35 % (ref 34.0–46.6)
Hemoglobin: 10.9 g/dL — ABNORMAL LOW (ref 11.1–15.9)
Immature Grans (Abs): 0 10*3/uL (ref 0.0–0.1)
Immature Granulocytes: 0 %
Lymphocytes Absolute: 2.1 10*3/uL (ref 0.7–3.1)
Lymphs: 34 %
MCH: 23.1 pg — ABNORMAL LOW (ref 26.6–33.0)
MCHC: 31.1 g/dL — ABNORMAL LOW (ref 31.5–35.7)
MCV: 74 fL — ABNORMAL LOW (ref 79–97)
Monocytes Absolute: 0.4 10*3/uL (ref 0.1–0.9)
Monocytes: 7 %
Neutrophils Absolute: 3.3 10*3/uL (ref 1.4–7.0)
Neutrophils: 56 %
Platelets: 458 10*3/uL — ABNORMAL HIGH (ref 150–450)
RBC: 4.72 x10E6/uL (ref 3.77–5.28)
RDW: 14.6 % (ref 11.7–15.4)
WBC: 6 10*3/uL (ref 3.4–10.8)

## 2023-06-12 LAB — IRON,TIBC AND FERRITIN PANEL
Ferritin: 9 ng/mL — ABNORMAL LOW (ref 15–150)
Iron Saturation: 12 % — ABNORMAL LOW (ref 15–55)
Iron: 53 ug/dL (ref 27–159)
Total Iron Binding Capacity: 459 ug/dL — ABNORMAL HIGH (ref 250–450)
UIBC: 406 ug/dL (ref 131–425)

## 2023-06-12 LAB — VITAMIN B6: Vitamin B6: 9.2 ug/L (ref 3.4–65.2)

## 2023-06-12 LAB — MAGNESIUM: Magnesium: 1.8 mg/dL (ref 1.6–2.3)

## 2023-06-12 LAB — VITAMIN D 25 HYDROXY (VIT D DEFICIENCY, FRACTURES): Vit D, 25-Hydroxy: 23.9 ng/mL — ABNORMAL LOW (ref 30.0–100.0)

## 2023-06-12 LAB — LIPID PANEL
Chol/HDL Ratio: 3.4 ratio (ref 0.0–4.4)
Cholesterol, Total: 235 mg/dL — ABNORMAL HIGH (ref 100–199)
HDL: 70 mg/dL (ref >39)
LDL Chol Calc (NIH): 153 mg/dL — ABNORMAL HIGH (ref 0–99)
Triglycerides: 69 mg/dL (ref 0–149)
VLDL Cholesterol Cal: 12 mg/dL (ref 5–40)

## 2023-06-12 LAB — B12 AND FOLATE PANEL
Folate: 6.1 ng/mL (ref >3.0)
Vitamin B-12: 287 pg/mL (ref 232–1245)

## 2023-06-12 LAB — VITAMIN B1: Thiamine: 94.6 nmol/L (ref 66.5–200.0)

## 2023-06-13 ENCOUNTER — Other Ambulatory Visit (HOSPITAL_BASED_OUTPATIENT_CLINIC_OR_DEPARTMENT_OTHER): Payer: Self-pay

## 2023-06-13 ENCOUNTER — Other Ambulatory Visit: Payer: Self-pay

## 2023-06-14 ENCOUNTER — Other Ambulatory Visit (HOSPITAL_BASED_OUTPATIENT_CLINIC_OR_DEPARTMENT_OTHER): Payer: Self-pay

## 2023-06-15 ENCOUNTER — Other Ambulatory Visit (HOSPITAL_BASED_OUTPATIENT_CLINIC_OR_DEPARTMENT_OTHER): Payer: Self-pay

## 2023-06-15 MED ORDER — FERROUS SULFATE 325 (65 FE) MG PO TABS
325.0000 mg | ORAL_TABLET | Freq: Every day | ORAL | 3 refills | Status: DC
  Filled 2023-06-15: qty 100, 100d supply, fill #0

## 2023-06-17 ENCOUNTER — Ambulatory Visit: Payer: Commercial Managed Care - PPO | Admitting: Family Medicine

## 2023-06-17 VITALS — BP 135/81 | HR 60 | Ht 65.5 in

## 2023-06-17 DIAGNOSIS — D510 Vitamin B12 deficiency anemia due to intrinsic factor deficiency: Secondary | ICD-10-CM | POA: Diagnosis not present

## 2023-06-17 MED ORDER — CYANOCOBALAMIN 1000 MCG/ML IJ SOLN
1000.0000 ug | Freq: Once | INTRAMUSCULAR | Status: AC
Start: 2023-06-17 — End: 2023-06-17
  Administered 2023-06-17: 1000 ug via INTRAMUSCULAR

## 2023-06-17 NOTE — Progress Notes (Signed)
Agree with documentation as above.   Catherine Metheney, MD  

## 2023-06-17 NOTE — Progress Notes (Signed)
Patient is here for a vitamin B12 injection. Denies GI problems or dizziness.   Patient tolerated injection well without complications. Pt is advised to schedule next injection in 30 days.  Location:  [facility] 

## 2023-06-21 ENCOUNTER — Other Ambulatory Visit (HOSPITAL_BASED_OUTPATIENT_CLINIC_OR_DEPARTMENT_OTHER): Payer: Self-pay

## 2023-06-22 ENCOUNTER — Other Ambulatory Visit (HOSPITAL_BASED_OUTPATIENT_CLINIC_OR_DEPARTMENT_OTHER): Payer: Self-pay

## 2023-06-23 ENCOUNTER — Encounter (HOSPITAL_BASED_OUTPATIENT_CLINIC_OR_DEPARTMENT_OTHER): Payer: Self-pay

## 2023-06-23 ENCOUNTER — Other Ambulatory Visit (HOSPITAL_BASED_OUTPATIENT_CLINIC_OR_DEPARTMENT_OTHER): Payer: Self-pay

## 2023-06-23 ENCOUNTER — Ambulatory Visit (INDEPENDENT_AMBULATORY_CARE_PROVIDER_SITE_OTHER): Payer: Commercial Managed Care - PPO

## 2023-06-23 DIAGNOSIS — D510 Vitamin B12 deficiency anemia due to intrinsic factor deficiency: Secondary | ICD-10-CM

## 2023-06-23 MED ORDER — CYANOCOBALAMIN 1000 MCG/ML IJ SOLN
1000.0000 ug | Freq: Once | INTRAMUSCULAR | Status: AC
Start: 2023-06-23 — End: 2023-06-23
  Administered 2023-06-23: 1000 ug via INTRAMUSCULAR

## 2023-06-23 NOTE — Progress Notes (Signed)
   Established Patient Office Visit  Subjective   Patient ID: Rose Long, female    DOB: 1961/03/21  Age: 62 y.o. MRN: [id number]  Chief Complaint  Patient presents with   Pernicious Anemia    HPI  Rose Long is here for a vitamin B 12 injection. Denies muscle cramps, weakness or irregular heart rate.   ROS    Objective:     There were no vitals taken for this visit.   Physical Exam   No results found for any visits on 06/23/23.    The 10-year ASCVD risk score (Arnett DK, et al., 2019) is: 3.9%    Assessment & Plan:  B12 injection - Patient tolerated injection well without complications. Patient advised to schedule next injection 7 days from today.    B12 1000 mcg weekly for 4 weeks and then every 2 weeks for a month and then monthly. This is the 2nd week.   Problem List Items Addressed This Visit       Unprioritized   Pernicious anemia - Primary    Return in about 1 week (around 06/30/2023) for B12 injection. Earna Coder, Janalyn Harder, CMA

## 2023-06-24 NOTE — Telephone Encounter (Signed)
Initiated Prior authorization [id number] 8-90MG  er tablets  Via: Covermymeds Case/Key:[id number] Status: Pending as of 06/24/23 Reason: Notified Pt via: Mychart

## 2023-06-28 ENCOUNTER — Encounter: Payer: Commercial Managed Care - PPO | Admitting: Family Medicine

## 2023-06-28 ENCOUNTER — Other Ambulatory Visit (HOSPITAL_BASED_OUTPATIENT_CLINIC_OR_DEPARTMENT_OTHER): Payer: Self-pay

## 2023-06-30 ENCOUNTER — Ambulatory Visit (INDEPENDENT_AMBULATORY_CARE_PROVIDER_SITE_OTHER): Payer: Commercial Managed Care - PPO

## 2023-06-30 VITALS — BP 141/87 | HR 57 | Ht 65.5 in

## 2023-06-30 DIAGNOSIS — D510 Vitamin B12 deficiency anemia due to intrinsic factor deficiency: Secondary | ICD-10-CM

## 2023-06-30 MED ORDER — CYANOCOBALAMIN 1000 MCG/ML IJ SOLN
1000.0000 ug | Freq: Once | INTRAMUSCULAR | Status: AC
Start: 2023-06-30 — End: 2023-06-30
  Administered 2023-06-30: 1000 ug via INTRAMUSCULAR

## 2023-06-30 NOTE — Progress Notes (Signed)
   Established Patient Office Visit  Subjective   Patient ID: Rose Long, female    DOB: June 04, 1961  Age: 62 y.o. MRN: [id number]  Chief Complaint  Patient presents with   Pernicious Anemia    B12 injection nurse visit.     HPI  Pernicious anemia- B12 injection nurse visit.   ROS    Objective:     BP (!) 141/87   Pulse (!) 57   Ht 5' 5.5" (1.664 m)   SpO2 100%   BMI 33.10 kg/m    Physical Exam   No results found for any visits on 06/30/23.    The 10-year ASCVD risk score (Arnett DK, et al., 2019) is: 4.3%    Assessment & Plan:  B12 injection. Admin 1,000 mcg IM Right deltoid. Patient tolerated injection well without complications.  Return for next B12 injection in 14 days as nurse visit.  Problem List Items Addressed This Visit       Other   Pernicious anemia - Primary    Return in about 2 weeks (around 07/14/2023) for return in 14 days for next B12 injection as nurse visit.Elizabeth Palau, LPN

## 2023-06-30 NOTE — Patient Instructions (Signed)
Return in 14 days for next B12 injection as nurse visit.

## 2023-07-04 ENCOUNTER — Encounter: Payer: Self-pay | Admitting: Family Medicine

## 2023-07-04 ENCOUNTER — Ambulatory Visit (INDEPENDENT_AMBULATORY_CARE_PROVIDER_SITE_OTHER): Payer: Commercial Managed Care - PPO | Admitting: Family Medicine

## 2023-07-04 VITALS — BP 121/78 | HR 65 | Temp 98.3°F | Ht 66.5 in | Wt 212.0 lb

## 2023-07-04 DIAGNOSIS — E559 Vitamin D deficiency, unspecified: Secondary | ICD-10-CM | POA: Diagnosis not present

## 2023-07-04 DIAGNOSIS — Z6833 Body mass index (BMI) 33.0-33.9, adult: Secondary | ICD-10-CM

## 2023-07-04 DIAGNOSIS — E6609 Other obesity due to excess calories: Secondary | ICD-10-CM

## 2023-07-04 DIAGNOSIS — D510 Vitamin B12 deficiency anemia due to intrinsic factor deficiency: Secondary | ICD-10-CM | POA: Diagnosis not present

## 2023-07-04 DIAGNOSIS — Z0289 Encounter for other administrative examinations: Secondary | ICD-10-CM

## 2023-07-04 DIAGNOSIS — Z9884 Bariatric surgery status: Secondary | ICD-10-CM | POA: Diagnosis not present

## 2023-07-04 NOTE — Assessment & Plan Note (Signed)
Last vitamin D Lab Results  Component Value Date   VD25OH 23.9 (L) 06/02/2023   Her last vitamin D level was low at 23.9 with a target vitamin D level 50-70.  She does complain of fatigue.  She is currently on 50,000 IU of vitamin D once weekly through her PCP.  Will plan to recheck vitamin D levels in the next 3 months

## 2023-07-04 NOTE — Progress Notes (Signed)
Office: [phone]  /  Fax: [phone]   Initial Visit  Rose Long was seen in clinic today to evaluate for obesity. She is interested in losing weight to improve overall health and reduce the risk of weight related complications. She presents today to review program treatment options, initial physical assessment, and evaluation.     She was referred by: PCP  When asked what else they would like to accomplish? She states: Improve energy levels and physical activity, Improve existing medical conditions, Improve quality of life, and Improve appearance  Weight history: she would like to get to 180 lb.  Lost weight on Wegovy last year from 215 lb to 193 lb but regained off of it.  S/p RYGB surgery at [facility] 14 years ago; pre op 271 lb.  When asked how has your weight affected you? She states: Contributed to medical problems, Contributed to orthopedic problems or mobility issues, and Having fatigue  Some associated conditions: Arthritis:RA  Contributing factors: Family history  Weight promoting medications identified: None  Current nutrition plan: None  Current level of physical activity: NEAT  Current or previous pharmacotherapy: GLP-1 and Other: Contrave; on compounded semaglutide  - from [place]  Response to medication: Was cost prohibitive or lost coverage for AOM.   Past medical history includes:   Past Medical History:  Diagnosis Date   Anemia    Arthritis    Bronchitis 2019   Complication of anesthesia    PONV   Family history of adverse reaction to anesthesia    pt's mother stopped breathing possibly due to sleep apnea   Hiatal hernia    Migraine    Obesity    Simple ovarian cyst    Right     Objective:   BP 121/78   Pulse 65   Temp 98.3 F (36.8 C)   Ht 5' 6.5" (1.689 m)   Wt 212 lb (96.2 kg)   SpO2 96%   BMI 33.71 kg/m  She was weighed on the bioimpedance scale: Body mass index is 33.71 kg/m.  Peak Weight:212 , Body Fat%:44.6, Visceral Fat  Rating:12, Weight trend over the last 12 months: Increasing  General:  Alert, oriented and cooperative. Patient is in no acute distress.  Respiratory: Normal respiratory effort, no problems with respiration noted   Gait: able to ambulate independently  Mental Status: Normal mood and affect. Normal behavior. Normal judgment and thought content.   DIAGNOSTIC DATA REVIEWED:  BMET    Component Value Date/Time   NA 141 06/02/2023 0947   K 4.4 06/02/2023 0947   CL 104 06/02/2023 0947   CO2 22 06/02/2023 0947   GLUCOSE 85 06/02/2023 0947   GLUCOSE 82 03/30/2022 1023   BUN 14 06/02/2023 0947   CREATININE 0.82 06/02/2023 0947   CREATININE 0.81 03/30/2022 1023   CALCIUM 9.7 06/02/2023 0947   GFRNONAA 96 03/05/2021 1058   GFRAA 111 03/05/2021 1058   No results found for: "HGBA1C" No results found for: "INSULIN" CBC    Component Value Date/Time   WBC 6.0 06/02/2023 0947   WBC 6.6 03/30/2022 1023   RBC 4.72 06/02/2023 0947   RBC 4.88 03/30/2022 1023   HGB 10.9 (L) 06/02/2023 0947   HGB 12.2 05/21/2011 1504   HCT 35.0 06/02/2023 0947   HCT 36.5 05/21/2011 1504   PLT 458 (H) 06/02/2023 0947   MCV 74 (L) 06/02/2023 0947   MCV 79.8 05/21/2011 1504   MCH 23.1 (L) 06/02/2023 0947   MCH 25.0 (L) 03/30/2022  1023   MCHC 31.1 (L) 06/02/2023 0947   MCHC 31.0 (L) 03/30/2022 1023   RDW 14.6 06/02/2023 0947   RDW 28.8 (H) 05/21/2011 1504   Iron/TIBC/Ferritin/ %Sat    Component Value Date/Time   IRON 53 06/02/2023 0947   TIBC 459 (H) 06/02/2023 0947   FERRITIN 9 (L) 06/02/2023 0947   IRONPCTSAT 12 (L) 06/02/2023 0947   IRONPCTSAT 7 (L) 11/28/2014 1211   Lipid Panel     Component Value Date/Time   CHOL 235 (H) 06/02/2023 0947   TRIG 69 06/02/2023 0947   HDL 70 06/02/2023 0947   CHOLHDL 3.4 06/02/2023 0947   CHOLHDL 4.1 03/30/2022 1023   VLDL 15 08/05/2014 0828   LDLCALC 153 (H) 06/02/2023 0947   LDLCALC 149 (H) 03/30/2022 1023   Hepatic Function Panel     Component Value  Date/Time   PROT 7.1 06/02/2023 0947   ALBUMIN 4.0 06/02/2023 0947   AST 19 06/02/2023 0947   ALT 14 06/02/2023 0947   ALKPHOS 92 06/02/2023 0947   BILITOT 0.3 06/02/2023 0947      Component Value Date/Time   TSH 1.62 06/26/2020 1241     Assessment and Plan:   Weight gain following gastric bypass surgery Assessment & Plan: Rose Long 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 Ezzard Standing.  She had a preop weight of 271 pounds.  She would like to get down to 180 pounds as her target weight.  She had the most success using Wegovy getting down to 189 pounds last year but discontinued after lack of insurance coverage.  She no longer feels most restriction from her gastric bypass.  She has not dietary logging and feels like she is getting inadequate intake of protein.  She is currently not doing any resistance training.  She has had deficiencies of iron, vitamin D and B12.  Will update annual bariatric labs next visit as needed. Will move towards getting in 4-5 small meals per day including lean protein with each meal with a target goal of 100 g of dietary protein intake daily   Class 1 obesity due to excess calories with body mass index (BMI) of 33.0 to 33.9 in adult, unspecified whether serious comorbidity present  Pernicious anemia Assessment & Plan: Lab Results  Component Value Date   VITAMINB12 287 06/02/2023   She is currently on injectable vitamin B12 monthly with her PCP.  Her last B12 level in August was still borderline low.  She does complain of fatigue.  She denies paresthesias.  Continue B12 injections per PCP for pernicious anemia.   Vitamin D deficiency Assessment & Plan: Last vitamin D Lab Results  Component Value Date   VD25OH 23.9 (L) 06/02/2023   Her last vitamin D level was low at 23.9 with a target vitamin D level 50-70.  She does complain of fatigue.  She is currently on 50,000 IU of  vitamin D once weekly through her PCP.  Will plan to recheck vitamin D levels in the next 3 months         Obesity Treatment / Action Plan:  Patient will work on garnering support from family and friends to begin weight loss journey. Will work on eliminating or reducing the presence of highly palatable, calorie dense foods in the home. Will complete provided nutritional and psychosocial assessment questionnaire before the next appointment. Will be scheduled for indirect calorimetry to determine resting energy expenditure in a fasting  state.  This will allow [place] to create a reduced calorie, high-protein meal plan to promote loss of fat mass while preserving muscle mass. Will think about ideas on how to incorporate physical activity into their daily routine. Counseled on the health benefits of losing 5%-15% of total body weight. Was counseled on nutritional approaches to weight loss and benefits of reducing processed foods and consuming plant-based foods and high quality protein as part of nutritional weight management. Was counseled on pharmacotherapy and role as an adjunct in weight management.   Obesity Education Performed Today:  She was weighed on the bioimpedance scale and results were discussed and documented in the synopsis.  We discussed obesity as a disease and the importance of a more detailed evaluation of all the factors contributing to the disease.  We discussed the importance of long term lifestyle changes which include nutrition, exercise and behavioral modifications as well as the importance of customizing this to her specific health and social needs.   I have personally spent 30 minutes total time today in preparation, patient care, nutritional counseling and documentation for this visit, including the following: review of clinical lab tests; review of medical tests/procedures/services.    I have reviewed the above documentation for accuracy and completeness, and I agree  with the above. Glennis Brink, DO

## 2023-07-04 NOTE — Assessment & Plan Note (Signed)
Lab Results  Component Value Date   VITAMINB12 287 06/02/2023   She is currently on injectable vitamin B12 monthly with her PCP.  Her last B12 level in August was still borderline low.  She does complain of fatigue.  She denies paresthesias.  Continue B12 injections per PCP for pernicious anemia.

## 2023-07-04 NOTE — Assessment & Plan Note (Signed)
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 Ezzard Standing.  She had a preop weight of 271 pounds.  She would like to get down to 180 pounds as her target weight.  She had the most success using Wegovy getting down to 189 pounds last year but discontinued after lack of insurance coverage.  She no longer feels most restriction from her gastric bypass.  She has not dietary logging and feels like she is getting inadequate intake of protein.  She is currently not doing any resistance training.  She has had deficiencies of iron, vitamin D and B12.  Will update annual bariatric labs next visit as needed. Will move towards getting in 4-5 small meals per day including lean protein with each meal with a target goal of 100 g of dietary protein intake daily

## 2023-07-17 ENCOUNTER — Encounter (INDEPENDENT_AMBULATORY_CARE_PROVIDER_SITE_OTHER): Payer: Self-pay

## 2023-07-18 ENCOUNTER — Ambulatory Visit: Payer: Commercial Managed Care - PPO | Admitting: Bariatrics

## 2023-07-18 ENCOUNTER — Telehealth: Payer: Self-pay

## 2023-07-18 NOTE — Telephone Encounter (Signed)
Initiated Prior authorization UEA:[id number] 75MG  dispersible tablets Via: Covermymeds Case/Key:[id number] Status: approved  as of 07/18/23 Reason:Authorization Expiration Date: 07/16/2024  Notified Pt via: Mychart

## 2023-07-20 ENCOUNTER — Other Ambulatory Visit (HOSPITAL_BASED_OUTPATIENT_CLINIC_OR_DEPARTMENT_OTHER): Payer: Self-pay

## 2023-08-02 ENCOUNTER — Ambulatory Visit: Payer: Commercial Managed Care - PPO | Admitting: Family Medicine

## 2023-08-03 ENCOUNTER — Other Ambulatory Visit: Payer: Self-pay

## 2023-08-03 ENCOUNTER — Other Ambulatory Visit: Payer: Self-pay | Admitting: Family Medicine

## 2023-08-03 ENCOUNTER — Other Ambulatory Visit: Payer: Self-pay | Admitting: Physician Assistant

## 2023-08-03 ENCOUNTER — Other Ambulatory Visit (HOSPITAL_BASED_OUTPATIENT_CLINIC_OR_DEPARTMENT_OTHER): Payer: Self-pay

## 2023-08-03 DIAGNOSIS — E66812 Obesity, class 2: Secondary | ICD-10-CM

## 2023-08-03 DIAGNOSIS — F411 Generalized anxiety disorder: Secondary | ICD-10-CM

## 2023-08-03 DIAGNOSIS — Z9884 Bariatric surgery status: Secondary | ICD-10-CM

## 2023-08-03 MED ORDER — CONTRAVE 8-90 MG PO TB12
ORAL_TABLET | ORAL | 0 refills | Status: DC
Start: 2023-08-03 — End: 2023-08-22
  Filled 2023-08-03: qty 80, 33d supply, fill #0

## 2023-08-03 MED ORDER — SERTRALINE HCL 100 MG PO TABS
100.0000 mg | ORAL_TABLET | Freq: Every day | ORAL | 1 refills | Status: DC
Start: 2023-08-03 — End: 2024-02-15
  Filled 2023-08-03 – 2023-08-22 (×2): qty 90, 90d supply, fill #0
  Filled 2023-09-17 – 2023-11-28 (×2): qty 90, 90d supply, fill #1
  Filled ????-??-??: fill #1

## 2023-08-11 ENCOUNTER — Other Ambulatory Visit (HOSPITAL_BASED_OUTPATIENT_CLINIC_OR_DEPARTMENT_OTHER): Payer: Self-pay

## 2023-08-15 ENCOUNTER — Ambulatory Visit: Payer: Commercial Managed Care - PPO | Admitting: Family Medicine

## 2023-08-22 ENCOUNTER — Other Ambulatory Visit: Payer: Self-pay | Admitting: Family Medicine

## 2023-08-22 ENCOUNTER — Other Ambulatory Visit (HOSPITAL_BASED_OUTPATIENT_CLINIC_OR_DEPARTMENT_OTHER): Payer: Self-pay

## 2023-08-22 ENCOUNTER — Other Ambulatory Visit: Payer: Self-pay | Admitting: Physician Assistant

## 2023-08-22 DIAGNOSIS — E66812 Other obesity due to excess calories: Secondary | ICD-10-CM

## 2023-08-22 DIAGNOSIS — Z9884 Bariatric surgery status: Secondary | ICD-10-CM

## 2023-08-22 DIAGNOSIS — F411 Generalized anxiety disorder: Secondary | ICD-10-CM

## 2023-08-23 ENCOUNTER — Ambulatory Visit (INDEPENDENT_AMBULATORY_CARE_PROVIDER_SITE_OTHER): Payer: Commercial Managed Care - PPO | Admitting: Family Medicine

## 2023-08-23 ENCOUNTER — Encounter: Payer: Self-pay | Admitting: Family Medicine

## 2023-08-23 VITALS — BP 141/81 | HR 56 | Temp 97.9°F | Ht 66.5 in | Wt 216.0 lb

## 2023-08-23 DIAGNOSIS — K9189 Other postprocedural complications and disorders of digestive system: Secondary | ICD-10-CM

## 2023-08-23 DIAGNOSIS — K912 Postsurgical malabsorption, not elsewhere classified: Secondary | ICD-10-CM | POA: Insufficient documentation

## 2023-08-23 DIAGNOSIS — R5383 Other fatigue: Secondary | ICD-10-CM | POA: Diagnosis not present

## 2023-08-23 DIAGNOSIS — E66811 Obesity, class 1: Secondary | ICD-10-CM

## 2023-08-23 DIAGNOSIS — D508 Other iron deficiency anemias: Secondary | ICD-10-CM

## 2023-08-23 DIAGNOSIS — R0602 Shortness of breath: Secondary | ICD-10-CM

## 2023-08-23 DIAGNOSIS — Z9884 Bariatric surgery status: Secondary | ICD-10-CM | POA: Diagnosis not present

## 2023-08-23 DIAGNOSIS — K219 Gastro-esophageal reflux disease without esophagitis: Secondary | ICD-10-CM | POA: Diagnosis not present

## 2023-08-23 DIAGNOSIS — E669 Obesity, unspecified: Secondary | ICD-10-CM | POA: Diagnosis not present

## 2023-08-23 DIAGNOSIS — Z1331 Encounter for screening for depression: Secondary | ICD-10-CM

## 2023-08-23 DIAGNOSIS — Z6834 Body mass index (BMI) 34.0-34.9, adult: Secondary | ICD-10-CM

## 2023-08-23 NOTE — Assessment & Plan Note (Signed)
She has not been consistent with taking a multivitamin daily.  She is taking an oral iron supplement, ferrous sulfate per her PCP which has been constipating.  She has required IV iron infusions in the past.  She is taking a B12 supplement.  She has complaints of fatigue.  Reviewed most recent labs in her chart and updated additional ones today. Will likely swap out her multivitamin for bariatric multivitamin.

## 2023-08-23 NOTE — Assessment & Plan Note (Addendum)
S/p RYGB done 2013 by Dr Ezzard Standing at [facility] Pre op weight Post op nadir weight 180 lb Takes vitamin D, occasional iron (constipating) and MVI on occasion Feeling more tired lately  Weight regain due to poor dietary habits, lack of adequate lean protein and fiber with meals, over consumption of added sugar and refined carbohydrates and lack of regular exercise. Consider use of antiobesity medication and work on improving compliance with post bariatric surgery lifestyle changes

## 2023-08-23 NOTE — Assessment & Plan Note (Addendum)
Has needed IV iron in the past Currently on an oral iron supplement which is causing some constipation Complains of fatigue and has had some mild anemia  Recheck iron levels today

## 2023-08-23 NOTE — Progress Notes (Signed)
I whose first her MS is low in first PRIMARY CARE AT [facility] [place] [address]  [place] [address] Dept: [phone] Dept Fax: [phone]  At a Glance:  Vitals Temp: 97.9 F (36.6 C) BP: (!) 141/81 Pulse Rate: (!) 56 SpO2: 97 %   Anthropometric Measurements Height: 5' 6.5" (1.689 m) Weight: 216 lb (98 kg) BMI (Calculated): 34.35 Weight at Last Visit: 212lb Weight Lost Since Last Visit: 0lb Weight Gained Since Last Visit: 4lb Starting Weight: 216lb   Body Composition  Body Fat %: 46.1 % Fat Mass (lbs): 99.6 lbs Muscle Mass (lbs): 110.6 lbs Total Body Water (lbs): 77.6 lbs Visceral Fat Rating : 13   Other Clinical Data RMR: 1957 Fasting: Yes Labs: Yes Today's Visit #: 1 Starting Date: 08/23/23    EKG: sinus bradycardia,  rate 57 BPM.  Indirect Calorimeter completed today shows a VO2 of 284 and a REE of 1958.  Rose Long calculated basal metabolic rate is 3244 thus Rose Long basal metabolic rate is better than expected.  Chief Complaint:  Obesity   Subjective:  Rose Long (MR# [id number]) is a 62 y.o. female who presents for evaluation and treatment of obesity and related comorbidities.   Rose Long is currently in the action stage of change and ready to dedicate time achieving and maintaining a healthier weight. Rose Long is interested in becoming our patient and working on intensive lifestyle modifications including (but not limited to) diet and exercise for weight loss.  Rose Long has been struggling with Rose Long weight. She has been unsuccessful in either losing weight, maintaining weight loss, or reaching Rose Long healthy weight goal.  She works as a Economist.  Lives with husband.  Walks 20 min 2 x a week.  Would like to lose 40 lb.  Wants to lose weight to help with knee pain.  Has used Wegovy in past.  Craves sugar.  Hx of binge eating.  Snores with 8+ hrs of sleep at night.  Hx of negative  PSG.  Sensitive to dairy but can tolerate [employer] shakes.  No longer gets dumping syndrome from sweets.  Used to enjoy the gym and walking.    Rose Long's habits were reviewed today and are as follows:  Eats family meals together and Has significant food craving issues Picky eater with few fruits and veggies   She started gaining weight during childhood. She has dealt with weight issues for most of Rose Long life.   Depression Screen Shantice's Food and Mood (modified PHQ-9) score was 1.     06/03/2023    7:28 AM  Depression screen PHQ 2/9  Decreased Interest 0  Down, Depressed, Hopeless 0  PHQ - 2 Score 0     Assessment and Plan:   1. SOB (shortness of breath) She has noticed increased shortness of breath with exercise, worse over time with weight gain.  She has been getting out of breath sooner with activity than she used to.  This has worsened recently.  She denies shortness of breath at rest or orthopnea. EKG reviewed from today, normal sinus rhythm  Rose Long is currently in the action stage of change and Rose Long goal is to continue with weight loss efforts. I recommend M[place]ah begin the structured treatment plan as follows:  She has agreed to Category 3 Plan  Exercise goals: All adults should avoid inactivity. Some activity is better than none, and adults who participate in any amount of physical activity, gain some health benefits.  Behavioral modification strategies:increasing lean protein intake, decreasing simple carbohydrates, increasing vegetables, increase H2O intake, decrease liquid calories, increase high fiber foods, decreasing eating out, no skipping meals, meal planning and cooking strategies, keeping healthy foods in the home, better snacking choices, and emotional eating strategies   She was informed of the importance of frequent follow-up visits to maximize Rose Long success with intensive lifestyle modifications for Rose Long multiple health conditions. She was informed we would  discuss Rose Long lab results at Rose Long next visit unless there is a critical issue that needs to be addressed sooner. [name] agreed to keep Rose Long next visit at the agreed upon time to discuss these results.  Objective:  General: Cooperative, alert, well developed, in no acute distress. HEENT: Conjunctivae and lids unremarkable. Cardiovascular: Regular rhythm.  Lungs: Normal work of breathing. Neurologic: No focal deficits.   Lab Results  Component Value Date   CREATININE 0.82 06/02/2023   BUN 14 06/02/2023   NA 141 06/02/2023   K 4.4 06/02/2023   CL 104 06/02/2023   CO2 22 06/02/2023   Lab Results  Component Value Date   ALT 14 06/02/2023   AST 19 06/02/2023   ALKPHOS 92 06/02/2023   BILITOT 0.3 06/02/2023   No results found for: "HGBA1C" No results found for: "INSULIN" Lab Results  Component Value Date   TSH 1.62 06/26/2020   Lab Results  Component Value Date   CHOL 235 (H) 06/02/2023   HDL 70 06/02/2023   LDLCALC 153 (H) 06/02/2023   TRIG 69 06/02/2023   CHOLHDL 3.4 06/02/2023   Lab Results  Component Value Date   WBC 6.0 06/02/2023   HGB 10.9 (L) 06/02/2023   HCT 35.0 06/02/2023   MCV 74 (L) 06/02/2023   PLT 458 (H) 06/02/2023   Lab Results  Component Value Date   IRON 53 06/02/2023   TIBC 459 (H) 06/02/2023   FERRITIN 9 (L) 06/02/2023   Problem List Items Addressed This Visit     Obesity   Weight gain following gastric bypass surgery    S/p RYGB done 2013 by Dr Ezzard Standing at [facility] Pre op weight Post op nadir weight 180 lb Takes vitamin D, occasional iron (constipating) and MVI on occasion Feeling more tired lately  Weight regain due to poor dietary habits, lack of adequate lean protein and fiber with meals, over consumption of added sugar and refined carbohydrates and lack of regular exercise. Consider use of antiobesity medication and work on improving compliance with post bariatric surgery lifestyle changes      GERD (gastroesophageal reflux disease)     Was seen by [facility] ~2 years ago - had endoscopy with esophageal dilation.  Having worsening heartburn with feeling of food 'getting stuck' in the throat.  Taking Omeprazole daily.   She denies regurgitation of undigested food.  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  She is  taking an oral iron supplement, ferrous sulfate per Rose Long PCP which has been constipating.  She has required IV iron infusions in the past.  She is taking a B12 supplement.  She has complaints of fatigue.  Reviewed most recent labs in Rose Long chart and updated additional ones today. Will likely swap out Rose Long multivitamin for bariatric multivitamin.        Relevant Orders   Ferritin   Iron and TIBC   Prealbumin   Other fatigue   Relevant Orders   Insulin, random   Hemoglobin A1c   TSH Rfx on Abnormal to Free T4   Other Visit Diagnoses     SOB (shortness of breath)    -  Primary   Relevant Orders   EKG 12-Lead (Completed)   H/O gastric bypass       Relevant Orders   Ambulatory referral to Gastroenterology        Attestation Statements:  Reviewed by clinician on day of visit: allergies, medications, problem list, medical history, surgical history, family history, social history, and previous encounter notes.  Time spent on visit including pre-visit chart review and post-visit charting and care was 49 minutes.   Glennis Brink, DO

## 2023-08-23 NOTE — Assessment & Plan Note (Addendum)
Was seen by [facility] ~2 years ago - had endoscopy with esophageal dilation.  Having worsening heartburn with feeling of food 'getting stuck' in the throat.  Taking Omeprazole daily.   She denies regurgitation of undigested food.  She denies epigastric discomfort, melena or hematochezia She has not had an upper GI since her gastric bypass and may require 1 given her larger food volumes at mealtime

## 2023-08-24 ENCOUNTER — Other Ambulatory Visit (HOSPITAL_BASED_OUTPATIENT_CLINIC_OR_DEPARTMENT_OTHER): Payer: Self-pay

## 2023-08-24 LAB — INSULIN, RANDOM: INSULIN: 7 u[IU]/mL (ref 2.6–24.9)

## 2023-08-24 LAB — IRON AND TIBC
Iron Saturation: 10 % — ABNORMAL LOW (ref 15–55)
Iron: 44 ug/dL (ref 27–139)
Total Iron Binding Capacity: 451 ug/dL — ABNORMAL HIGH (ref 250–450)
UIBC: 407 ug/dL — ABNORMAL HIGH (ref 118–369)

## 2023-08-24 LAB — HEMOGLOBIN A1C
Est. average glucose Bld gHb Est-mCnc: 123 mg/dL
Hgb A1c MFr Bld: 5.9 % — ABNORMAL HIGH (ref 4.8–5.6)

## 2023-08-24 LAB — PREALBUMIN: PREALBUMIN: 20 mg/dL (ref 10–36)

## 2023-08-24 LAB — TSH RFX ON ABNORMAL TO FREE T4: TSH: 4.16 u[IU]/mL (ref 0.450–4.500)

## 2023-08-24 LAB — FERRITIN: Ferritin: 9 ng/mL — ABNORMAL LOW (ref 15–150)

## 2023-08-24 MED ORDER — CONTRAVE 8-90 MG PO TB12
2.0000 | ORAL_TABLET | Freq: Two times a day (BID) | ORAL | 1 refills | Status: DC
Start: 2023-08-24 — End: 2023-09-06
  Filled 2023-08-24: qty 180, 45d supply, fill #0

## 2023-08-24 MED ORDER — LORAZEPAM 0.5 MG PO TABS
0.5000 mg | ORAL_TABLET | Freq: Two times a day (BID) | ORAL | 0 refills | Status: DC | PRN
  Filled 2023-08-24: qty 30, 15d supply, fill #0

## 2023-08-24 NOTE — Telephone Encounter (Signed)
Meds ordered this encounter  Medications   LORazepam (ATIVAN) 0.5 MG tablet    Sig: Take 1 tablet (0.5 mg total) by mouth 2 (two) times daily as needed for anxiety.    Dispense:  30 tablet    Refill:  0   Naltrexone-buPROPion HCl ER (CONTRAVE) 8-90 MG TB12    Sig: Take 2 tablets by mouth in the morning and at bedtime.    Dispense:  180 tablet    Refill:  1

## 2023-08-29 ENCOUNTER — Ambulatory Visit: Payer: Commercial Managed Care - PPO | Admitting: Nurse Practitioner

## 2023-08-30 NOTE — Addendum Note (Signed)
Addended by: Seymour Bars E on: 08/30/2023 12:12 PM   Modules accepted: Orders

## 2023-09-06 ENCOUNTER — Ambulatory Visit (INDEPENDENT_AMBULATORY_CARE_PROVIDER_SITE_OTHER): Payer: Commercial Managed Care - PPO | Admitting: Family Medicine

## 2023-09-06 ENCOUNTER — Other Ambulatory Visit (HOSPITAL_BASED_OUTPATIENT_CLINIC_OR_DEPARTMENT_OTHER): Payer: Self-pay

## 2023-09-06 ENCOUNTER — Encounter: Payer: Self-pay | Admitting: Family Medicine

## 2023-09-06 VITALS — BP 126/81 | HR 64 | Temp 98.6°F | Ht 66.5 in | Wt 215.0 lb

## 2023-09-06 DIAGNOSIS — Z6834 Body mass index (BMI) 34.0-34.9, adult: Secondary | ICD-10-CM | POA: Diagnosis not present

## 2023-09-06 DIAGNOSIS — D508 Other iron deficiency anemias: Secondary | ICD-10-CM | POA: Diagnosis not present

## 2023-09-06 DIAGNOSIS — R635 Abnormal weight gain: Secondary | ICD-10-CM

## 2023-09-06 DIAGNOSIS — E559 Vitamin D deficiency, unspecified: Secondary | ICD-10-CM

## 2023-09-06 DIAGNOSIS — E66811 Obesity, class 1: Secondary | ICD-10-CM | POA: Diagnosis not present

## 2023-09-06 DIAGNOSIS — E785 Hyperlipidemia, unspecified: Secondary | ICD-10-CM | POA: Diagnosis not present

## 2023-09-06 DIAGNOSIS — E6609 Other obesity due to excess calories: Secondary | ICD-10-CM

## 2023-09-06 DIAGNOSIS — K9189 Other postprocedural complications and disorders of digestive system: Secondary | ICD-10-CM

## 2023-09-06 DIAGNOSIS — Z9884 Bariatric surgery status: Secondary | ICD-10-CM | POA: Diagnosis not present

## 2023-09-06 MED ORDER — QSYMIA 7.5-46 MG PO CP24
1.0000 | ORAL_CAPSULE | Freq: Every day | ORAL | 0 refills | Status: DC
  Filled 2023-09-06 – 2023-09-26 (×2): qty 30, 30d supply, fill #0

## 2023-09-06 MED ORDER — POLYSACCHARIDE IRON COMPLEX 150 MG PO CAPS
150.0000 mg | ORAL_CAPSULE | Freq: Every day | ORAL | 0 refills | Status: DC
  Filled 2023-09-06: qty 30, 30d supply, fill #0

## 2023-09-06 NOTE — Progress Notes (Signed)
Office: [phone]  /  Fax: [phone]  WEIGHT SUMMARY AND BIOMETRICS  Starting Date: 08/23/23  Starting Weight: 216lb   Weight Lost Since Last Visit: 1lb   Vitals Temp: 98.6 F (37 C) BP: 126/81 Pulse Rate: 64 SpO2: 96 %   Body Composition  Body Fat %: 36.5 % Fat Mass (lbs): 78.6 lbs Muscle Mass (lbs): 129.8 lbs Total Body Water (lbs): 81.2 lbs Visceral Fat Rating : 11    HPI  Chief Complaint: OBESITY  Rose Long is here to discuss her progress with her obesity treatment plan. She is on the the Category 3 Plan and states she is following her eating plan approximately 75 % of the time. She states she is exercising 30 minutes 2 times per week.   Interval History:  Since last office visit she is down 1 lb She is trying to get in all her meals She feels full with meals Craving sweets as she has cut back on sweet tea and sugar She is getting in fruits and veggies She does feel better She is unable to get in all of the dinner due to her gastric bypass She is weaning off of sweet tea She has a good support system She is walking more with some R knee pain Time has been a barrier to adding more exercise in  Pharmacotherapy: On Contrave 2 tabs bid per PCP (no reported weight loss)  PHYSICAL EXAM:  Blood pressure 126/81, pulse 64, temperature 98.6 F (37 C), height 5' 6.5" (1.689 m), weight 215 lb (97.5 kg), SpO2 96%. Body mass index is 34.18 kg/m.  General: She is overweight, cooperative, alert, well developed, and in no acute distress. PSYCH: Has normal mood, affect and thought process.   Lungs: Normal breathing effort, no conversational dyspnea.   ASSESSMENT AND PLAN  TREATMENT PLAN FOR OBESITY:  Recommended Dietary Goals  Rose Long is currently in the action stage of change. As such, her goal is to continue weight management plan. She has agreed to the Category 3 Plan.  Behavioral Intervention  We discussed the following Behavioral Modification  Strategies today: increasing lean protein intake to established goals, increasing vegetables, increasing lower glycemic fruits, increasing water intake , work on meal planning and preparation, keeping healthy foods at home, identifying sources and decreasing liquid calories, decreasing eating out or consumption of processed foods, and making healthy choices when eating convenient foods, avoiding temptations and identifying enticing environmental cues, planning for success, and continue to work on maintaining a reduced calorie state, getting the recommended amount of protein, incorporating whole foods, making healthy choices, staying well hydrated and practicing mindfulness when eating..  Additional resources provided today: NA  Recommended Physical Activity Goals  Rose Long has been advised to work up to 150 minutes of moderate intensity aerobic activity a week and strengthening exercises 2-3 times per week for cardiovascular health, weight loss maintenance and preservation of muscle mass.   She has agreed to Think about enjoyable ways to increase daily physical activity and overcoming barriers to exercise and Increase physical activity in their day and reduce sedentary time (increase NEAT). - work on adding in short walks during the day  Pharmacotherapy changes for the treatment of obesity: discontinue Contrave.  Begin Qsymia 7.5/46 mg qAM.  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  She has stopped taking ferrous sulfate as prescribed in her chart due to constipating side effects.  She last required IV iron  infusion 8 years ago and has a known iron deficiency anemia post gastric bypass surgery from 2013.  She does complain of fatigue and feeling cold.  She denies pica symptoms.  Discontinue ferrous sulfate.  Begin Niferex 150 mg capsule once daily.  She has been referred to hematology and is scheduled in January for probable IV iron infusion.  Orders: -     Polysaccharide Iron Complex; Take 1 capsule (150 mg total) by mouth daily.  Dispense: 30 capsule; Refill: 0  Class 1 obesity due to excess calories with serious comorbidity and body mass index (BMI) of 34.0 to 34.9 in adult -     Qsymia; Take 1 capsule by mouth daily.  Dispense: 30 capsule; Refill: 0  Weight gain following gastric bypass surgery Assessment & Plan: She is struggling to get back down to her nadir weight of 180 pounds post gastric bypass surgery done in 2013 at [facility].  She has some volume restriction at mealtime.  She has been able to adhere to her prescribed category 3 meal plan.  She has reduced her intake of sweet tea and excess snacks.  She has been focused more on lean protein and fiber with meals, eating on a schedule and getting water outside of mealtime.  Will continue working on prescribed dietary changes, increasing walking time.  She will be trading out Contrave for Qsymia due to lack of weight loss on Contrave.   Vitamin D deficiency Assessment & Plan: Last vitamin D Lab Results  Component Value Date   VD25OH 23.9 (L) 06/02/2023   Her vitamin D level was updated by primary care 8/15.  She is currently on vitamin D 50,000 IU once weekly.  Continue vitamin D 50,000 IU once weekly.  Plan to recheck her level in the next 3 months   Hyperlipidemia, unspecified hyperlipidemia type Assessment & Plan: Lab Results  Component Value Date   CHOL 235 (H) 06/02/2023   HDL 70 06/02/2023   LDLCALC 153 (H) 06/02/2023   TRIG 69 06/02/2023   CHOLHDL 3.4 06/02/2023  Her cholesterol was updated by her primary care 8/15.  She  has not taken any lipid-lowering medication.  She has been working on her prescribed dietary plan which is low in saturated fat.  The 10-year ASCVD risk score (Arnett DK, et al., 2019) is: 3.4%   Values used to calculate the score:     Age: 4 years     Sex: Female     Is Non-Hispanic African American: No     Diabetic: No     Tobacco smoker: No     Systolic Blood Pressure: 126 mmHg     Is BP treated: No     HDL Cholesterol: 70 mg/dL     Total Cholesterol: 235 mg/dL        She was informed of the importance of frequent follow up visits to maximize her success with intensive lifestyle modifications for her multiple health conditions.   ATTESTASTION STATEMENTS:  Reviewed by clinician on day of visit: allergies, medications, problem list, medical history, surgical history, family history, social history, and previous encounter notes pertinent to obesity diagnosis.   I have personally spent 30 minutes total time today in  preparation, patient care, nutritional counseling and documentation for this visit, including the following: review of clinical lab tests; review of medical tests/procedures/services.      Glennis Brink, DO DABFM, DABOM [facility] [address] [place], [place] [address] [phone]

## 2023-09-06 NOTE — Assessment & Plan Note (Signed)
She is struggling to get back down to her nadir weight of 180 pounds post gastric bypass surgery done in 2013 at [facility].  She has some volume restriction at mealtime.  She has been able to adhere to her prescribed category 3 meal plan.  She has reduced her intake of sweet tea and excess snacks.  She has been focused more on lean protein and fiber with meals, eating on a schedule and getting water outside of mealtime.  Will continue working on prescribed dietary changes, increasing walking time.  She will be trading out Contrave for Qsymia due to lack of weight loss on Contrave.

## 2023-09-06 NOTE — Assessment & Plan Note (Signed)
Last vitamin D Lab Results  Component Value Date   VD25OH 23.9 (L) 06/02/2023   Her vitamin D level was updated by primary care 8/15.  She is currently on vitamin D 50,000 IU once weekly.  Continue vitamin D 50,000 IU once weekly.  Plan to recheck her level in the next 3 months

## 2023-09-06 NOTE — Assessment & Plan Note (Signed)
Lab Results  Component Value Date   CHOL 235 (H) 06/02/2023   HDL 70 06/02/2023   LDLCALC 153 (H) 06/02/2023   TRIG 69 06/02/2023   CHOLHDL 3.4 06/02/2023  Her cholesterol was updated by her primary care 8/15.  She has not taken any lipid-lowering medication.  She has been working on her prescribed dietary plan which is low in saturated fat.  The 10-year ASCVD risk score (Arnett DK, et al., 2019) is: 3.4%   Values used to calculate the score:     Age: 62 years     Sex: Female     Is Non-Hispanic African American: No     Diabetic: No     Tobacco smoker: No     Systolic Blood Pressure: 126 mmHg     Is BP treated: No     HDL Cholesterol: 70 mg/dL     Total Cholesterol: 235 mg/dL

## 2023-09-06 NOTE — Assessment & Plan Note (Signed)
Lab Results  Component Value Date   IRON 44 08/23/2023   TIBC 451 (H) 08/23/2023   FERRITIN 9 (L) 08/23/2023   Reviewed lab results with patient.  She has stopped taking ferrous sulfate as prescribed in her chart due to constipating side effects.  She last required IV iron infusion 8 years ago and has a known iron deficiency anemia post gastric bypass surgery from 2013.  She does complain of fatigue and feeling cold.  She denies pica symptoms.  Discontinue ferrous sulfate.  Begin Niferex 150 mg capsule once daily.  She has been referred to hematology and is scheduled in January for probable IV iron infusion.

## 2023-09-06 NOTE — Patient Instructions (Signed)
Stop Ferrous Sulfate Begin Nifferex once daily for RX iron Await hematology visit  Stay on RX vitamin D weekly and a MVI daily  Ok to stop milk, just prioritize higher protein snacks  Continue to reduce sweet tea intake Hydrate well with water  Continue to work on increasing walking time  Stop Contrave Begin Qsymia 7.5/46 mg each morning  Call if any problems or questions

## 2023-09-07 ENCOUNTER — Telehealth: Payer: Self-pay | Admitting: *Deleted

## 2023-09-07 DIAGNOSIS — E6609 Other obesity due to excess calories: Secondary | ICD-10-CM

## 2023-09-07 NOTE — Telephone Encounter (Signed)
Prior authorization done via cover my meds for patients Qsymia. Waiting on determination.

## 2023-09-09 ENCOUNTER — Other Ambulatory Visit (HOSPITAL_BASED_OUTPATIENT_CLINIC_OR_DEPARTMENT_OTHER): Payer: Self-pay

## 2023-09-12 ENCOUNTER — Other Ambulatory Visit (HOSPITAL_BASED_OUTPATIENT_CLINIC_OR_DEPARTMENT_OTHER): Payer: Self-pay

## 2023-09-12 ENCOUNTER — Ambulatory Visit: Payer: Commercial Managed Care - PPO | Admitting: Family Medicine

## 2023-09-12 NOTE — Telephone Encounter (Signed)
PA for Qsymia has been approved.   Outcome Approved on November 22 by [employer] 2017 The request has been approved. The authorization is effective from 09/24/2023 to 12/23/2023, as long as the member is enrolled in their current health plan. The request was reviewed and approved by a licensed clinical pharmacist. This request is approved for 1 capsule per day.An additional prior authorization [id number] has been entered for Qsymia 3.75mg -23mg , this request is effective from 09/09/2023 and is valid until 09/23/2023 and is limited to 1 capsule per day for 14 days. A written notification letter will follow with additional details. Authorization Expiration Date: 12/22/2023

## 2023-09-13 ENCOUNTER — Other Ambulatory Visit (HOSPITAL_BASED_OUTPATIENT_CLINIC_OR_DEPARTMENT_OTHER): Payer: Self-pay

## 2023-09-16 ENCOUNTER — Other Ambulatory Visit: Payer: Self-pay | Admitting: Family Medicine

## 2023-09-16 ENCOUNTER — Other Ambulatory Visit (HOSPITAL_BASED_OUTPATIENT_CLINIC_OR_DEPARTMENT_OTHER): Payer: Self-pay

## 2023-09-16 DIAGNOSIS — G479 Sleep disorder, unspecified: Secondary | ICD-10-CM

## 2023-09-17 ENCOUNTER — Other Ambulatory Visit: Payer: Self-pay | Admitting: Family Medicine

## 2023-09-17 ENCOUNTER — Other Ambulatory Visit: Payer: Self-pay

## 2023-09-17 DIAGNOSIS — F411 Generalized anxiety disorder: Secondary | ICD-10-CM

## 2023-09-18 ENCOUNTER — Other Ambulatory Visit (HOSPITAL_BASED_OUTPATIENT_CLINIC_OR_DEPARTMENT_OTHER): Payer: Self-pay

## 2023-09-18 MED ORDER — HYDROXYZINE PAMOATE 25 MG PO CAPS
25.0000 mg | ORAL_CAPSULE | Freq: Every evening | ORAL | 3 refills | Status: DC | PRN
  Filled 2023-09-18: qty 60, 30d supply, fill #0
  Filled 2023-10-24: qty 60, 30d supply, fill #1
  Filled 2023-12-05: qty 60, 30d supply, fill #2
  Filled 2024-01-13: qty 60, 30d supply, fill #3
  Filled ????-??-??: fill #2

## 2023-09-19 ENCOUNTER — Other Ambulatory Visit: Payer: Self-pay

## 2023-09-19 ENCOUNTER — Other Ambulatory Visit (HOSPITAL_BASED_OUTPATIENT_CLINIC_OR_DEPARTMENT_OTHER): Payer: Self-pay

## 2023-09-19 MED ORDER — QSYMIA 3.75-23 MG PO CP24
1.0000 | ORAL_CAPSULE | Freq: Every day | ORAL | 0 refills | Status: DC
  Filled 2023-09-19: qty 30, 30d supply, fill #0
  Filled 2023-09-30: qty 14, 14d supply, fill #0
  Filled 2023-10-24: qty 16, 16d supply, fill #1
  Filled 2023-10-24 – 2023-10-31 (×2): qty 14, 14d supply, fill #1

## 2023-09-19 MED ORDER — LORAZEPAM 0.5 MG PO TABS
0.5000 mg | ORAL_TABLET | Freq: Two times a day (BID) | ORAL | 0 refills | Status: DC | PRN
  Filled 2023-09-19: qty 30, 15d supply, fill #0

## 2023-09-19 NOTE — Addendum Note (Signed)
Addended by: Glennis Brink on: 09/19/2023 04:34 PM   Modules accepted: Orders

## 2023-09-20 ENCOUNTER — Other Ambulatory Visit (HOSPITAL_BASED_OUTPATIENT_CLINIC_OR_DEPARTMENT_OTHER): Payer: Self-pay

## 2023-09-21 ENCOUNTER — Other Ambulatory Visit (HOSPITAL_BASED_OUTPATIENT_CLINIC_OR_DEPARTMENT_OTHER): Payer: Self-pay

## 2023-09-22 ENCOUNTER — Other Ambulatory Visit (HOSPITAL_BASED_OUTPATIENT_CLINIC_OR_DEPARTMENT_OTHER): Payer: Self-pay

## 2023-09-23 ENCOUNTER — Other Ambulatory Visit (HOSPITAL_BASED_OUTPATIENT_CLINIC_OR_DEPARTMENT_OTHER): Payer: Self-pay

## 2023-09-26 ENCOUNTER — Other Ambulatory Visit (HOSPITAL_BASED_OUTPATIENT_CLINIC_OR_DEPARTMENT_OTHER): Payer: Self-pay

## 2023-09-26 ENCOUNTER — Ambulatory Visit: Payer: Commercial Managed Care - PPO | Admitting: Family Medicine

## 2023-09-27 ENCOUNTER — Other Ambulatory Visit (HOSPITAL_BASED_OUTPATIENT_CLINIC_OR_DEPARTMENT_OTHER): Payer: Self-pay

## 2023-09-28 ENCOUNTER — Other Ambulatory Visit (HOSPITAL_BASED_OUTPATIENT_CLINIC_OR_DEPARTMENT_OTHER): Payer: Self-pay

## 2023-09-28 ENCOUNTER — Encounter (HOSPITAL_BASED_OUTPATIENT_CLINIC_OR_DEPARTMENT_OTHER): Payer: Self-pay

## 2023-09-28 ENCOUNTER — Ambulatory Visit: Payer: Commercial Managed Care - PPO | Admitting: Bariatrics

## 2023-09-28 ENCOUNTER — Encounter: Payer: Self-pay | Admitting: Bariatrics

## 2023-09-28 VITALS — BP 116/76 | HR 64 | Temp 98.0°F | Ht 66.5 in | Wt 217.0 lb

## 2023-09-28 DIAGNOSIS — R632 Polyphagia: Secondary | ICD-10-CM

## 2023-09-28 DIAGNOSIS — E66811 Obesity, class 1: Secondary | ICD-10-CM

## 2023-09-28 DIAGNOSIS — E669 Obesity, unspecified: Secondary | ICD-10-CM

## 2023-09-28 DIAGNOSIS — K5900 Constipation, unspecified: Secondary | ICD-10-CM

## 2023-09-28 DIAGNOSIS — Z6834 Body mass index (BMI) 34.0-34.9, adult: Secondary | ICD-10-CM

## 2023-09-28 DIAGNOSIS — E785 Hyperlipidemia, unspecified: Secondary | ICD-10-CM

## 2023-09-28 DIAGNOSIS — E6609 Other obesity due to excess calories: Secondary | ICD-10-CM

## 2023-09-28 MED ORDER — LINACLOTIDE 72 MCG PO CAPS
72.0000 ug | ORAL_CAPSULE | Freq: Every day | ORAL | 0 refills | Status: DC
  Filled 2023-09-28: qty 30, 30d supply, fill #0

## 2023-09-28 NOTE — Progress Notes (Signed)
WEIGHT SUMMARY AND BIOMETRICS  Weight Lost Since Last Visit: 0  Weight Gained Since Last Visit: 2lb   Vitals Temp: 98 F (36.7 C) BP: 116/76 Pulse Rate: 64 SpO2: 97 %   Anthropometric Measurements Height: 5' 6.5" (1.689 m) Weight: 217 lb (98.4 kg) BMI (Calculated): 34.5 Weight at Last Visit: 215lb Weight Lost Since Last Visit: 0 Weight Gained Since Last Visit: 2lb Starting Weight: 216lb Total Weight Loss (lbs): 0 lb (0 kg)   Body Composition  Body Fat %: 46 % Fat Mass (lbs): 100 lbs Muscle Mass (lbs): 111.4 lbs Total Body Water (lbs): 79.2 lbs Visceral Fat Rating : 13   Other Clinical Data Fasting: no Labs: no Today's Visit #: 3 Starting Date: 08/23/23    OBESITY Georganna is here to discuss her progress with her obesity treatment plan along with follow-up of her obesity related diagnoses.    Nutrition Plan: the Category 3 plan - 65% adherence.  Current exercise: walking  Interim History:  She is up 2 lbs since her last visit.  Eating all of the food on the plan., Is not skipping meals, and Water intake is adequate.   Pharmacotherapy: Mccoy was prescribed Qsymia, but states that she has not been able to get the prescription based on availability.  Adverse side effects: none, has not started the medication.  Hunger is moderately controlled.  Cravings are moderately controlled.  Assessment/Plan:   Averley Wycoff endorses excessive hunger.  Medication(s): Qsymia approved per patient paperwork, but will need another prior authorization per the pharmacy. The lower dose has expired.   Plan: Medication(s): Will begin Qsymia at the lowest dose (3.75-23). Will resubmit prior authorization.  Will increase water, protein and fiber to help assuage hunger.  Will minimize foods that have a high glucose index/load to minimize reactive  hypoglycemia.   Hyperlipidemia LDL is not at goal. Medication(s): none Cardiovascular risk factors: obesity (BMI >= 30 kg/m2) and sedentary lifestyle  Lab Results  Component Value Date   CHOL 235 (H) 06/02/2023   HDL 70 06/02/2023   LDLCALC 153 (H) 06/02/2023   TRIG 69 06/02/2023   CHOLHDL 3.4 06/02/2023   Lab Results  Component Value Date   ALT 14 06/02/2023   AST 19 06/02/2023   ALKPHOS 92 06/02/2023   BILITOT 0.3 06/02/2023   The 10-year ASCVD risk score (Arnett DK, et al., 2019) is: 2.9%   Values used to calculate the score:     Age: 4 years     Sex: Female     Is Non-Hispanic African American: No     Diabetic: No     Tobacco smoker: No     Systolic Blood Pressure: 116 mmHg     Is BP treated: No     HDL Cholesterol: 70 mg/dL     Total Cholesterol: 235 mg/dL  Plan:  Continue statin.  Information sheet  on healthy vs unhealthy fats.  Will avoid all trans fats.  Will read labels Will minimize saturated fats except the following: low fat meats in moderation, diary, and limited dark chocolate.   Constipation Monse notes constipation.  Her constipation is lifelong and consistent.  She denies any history of a mechanical GI obstruction. She has  taken Miralac and Metamucil in the past.  Constipation is poorly controlled.   Plan: Increase fiber up to 25 to 30 grams of fiber.   Increase water intake to at least 64 ounces daily.  Rx:  Linzess 72 mg 1 daily 30 with no refills.  Will keep her appointment with GI in 01/25.    Generalized Obesity: Current BMI BMI (Calculated): 34.5   Pharmacotherapy Plan Begin Qsymia per Rx.   Josy is currently in the action stage of change. As such, her goal is to continue with weight loss efforts.  She has agreed to the Category 3 plan.  Exercise goals: All adults should avoid inactivity. Some physical activity is better than none, and adults who participate in any amount of physical activity gain some health  benefits.  Behavioral modification strategies: increasing lean protein intake, decreasing simple carbohydrates , meal planning , increase water intake, better snacking choices, increasing vegetables, increasing lower sugar fruits, increasing fiber rich foods, and mindful eating.  Adessa has agreed to follow-up with our clinic in 3 weeks with Dr. Cathey Endow.       Objective:   VITALS: Per patient if applicable, see vitals. GENERAL: Alert and in no acute distress. CARDIOPULMONARY: No increased WOB. Speaking in clear sentences.  PSYCH: Pleasant and cooperative. Speech normal rate and rhythm. Affect is appropriate. Insight and judgement are appropriate. Attention is focused, linear, and appropriate.  NEURO: Oriented as arrived to appointment on time with no prompting.   Attestation Statements:   This was prepared with the assistance of Engineer, civil (consulting).  Occasional wrong-word or sound-a-like substitutions may have occurred due to the inherent limitations of voice recognition   Corinna Capra, DO

## 2023-09-30 ENCOUNTER — Other Ambulatory Visit (HOSPITAL_COMMUNITY): Payer: Self-pay

## 2023-09-30 ENCOUNTER — Other Ambulatory Visit (HOSPITAL_BASED_OUTPATIENT_CLINIC_OR_DEPARTMENT_OTHER): Payer: Self-pay

## 2023-09-30 DIAGNOSIS — D509 Iron deficiency anemia, unspecified: Secondary | ICD-10-CM | POA: Diagnosis not present

## 2023-09-30 DIAGNOSIS — Z860100 Personal history of colon polyps, unspecified: Secondary | ICD-10-CM | POA: Diagnosis not present

## 2023-09-30 DIAGNOSIS — K2210 Ulcer of esophagus without bleeding: Secondary | ICD-10-CM | POA: Diagnosis not present

## 2023-09-30 DIAGNOSIS — R1310 Dysphagia, unspecified: Secondary | ICD-10-CM | POA: Diagnosis not present

## 2023-09-30 DIAGNOSIS — K5900 Constipation, unspecified: Secondary | ICD-10-CM | POA: Diagnosis not present

## 2023-09-30 DIAGNOSIS — Z9884 Bariatric surgery status: Secondary | ICD-10-CM | POA: Diagnosis not present

## 2023-09-30 MED ORDER — NA SULFATE-K SULFATE-MG SULF 17.5-3.13-1.6 GM/177ML PO SOLN
ORAL | 0 refills | Status: DC
  Filled 2023-09-30: qty 354, 1d supply, fill #0

## 2023-09-30 MED ORDER — OMEPRAZOLE 40 MG PO CPDR
40.0000 mg | DELAYED_RELEASE_CAPSULE | Freq: Every day | ORAL | 3 refills | Status: DC
  Filled 2023-09-30: qty 90, 90d supply, fill #0
  Filled ????-??-??: fill #1

## 2023-10-03 ENCOUNTER — Other Ambulatory Visit (HOSPITAL_BASED_OUTPATIENT_CLINIC_OR_DEPARTMENT_OTHER): Payer: Self-pay

## 2023-10-04 ENCOUNTER — Other Ambulatory Visit (HOSPITAL_BASED_OUTPATIENT_CLINIC_OR_DEPARTMENT_OTHER): Payer: Self-pay

## 2023-10-06 ENCOUNTER — Encounter (HOSPITAL_BASED_OUTPATIENT_CLINIC_OR_DEPARTMENT_OTHER): Payer: Self-pay

## 2023-10-06 ENCOUNTER — Other Ambulatory Visit (HOSPITAL_BASED_OUTPATIENT_CLINIC_OR_DEPARTMENT_OTHER): Payer: Self-pay

## 2023-10-10 ENCOUNTER — Ambulatory Visit: Payer: Commercial Managed Care - PPO | Admitting: Family Medicine

## 2023-10-16 ENCOUNTER — Other Ambulatory Visit: Payer: Self-pay | Admitting: Bariatrics

## 2023-10-16 ENCOUNTER — Other Ambulatory Visit: Payer: Self-pay | Admitting: Family Medicine

## 2023-10-16 ENCOUNTER — Other Ambulatory Visit (HOSPITAL_BASED_OUTPATIENT_CLINIC_OR_DEPARTMENT_OTHER): Payer: Self-pay

## 2023-10-16 ENCOUNTER — Encounter (HOSPITAL_BASED_OUTPATIENT_CLINIC_OR_DEPARTMENT_OTHER): Payer: Self-pay | Admitting: Pharmacist

## 2023-10-16 DIAGNOSIS — K9189 Other postprocedural complications and disorders of digestive system: Secondary | ICD-10-CM

## 2023-10-16 DIAGNOSIS — F411 Generalized anxiety disorder: Secondary | ICD-10-CM

## 2023-10-17 ENCOUNTER — Other Ambulatory Visit (HOSPITAL_BASED_OUTPATIENT_CLINIC_OR_DEPARTMENT_OTHER): Payer: Self-pay

## 2023-10-17 MED ORDER — OMEPRAZOLE 40 MG PO CPDR
40.0000 mg | DELAYED_RELEASE_CAPSULE | Freq: Two times a day (BID) | ORAL | 0 refills | Status: AC
  Filled 2023-10-17 – 2023-12-12 (×4): qty 180, 90d supply, fill #0

## 2023-10-21 ENCOUNTER — Other Ambulatory Visit: Payer: Self-pay | Admitting: Family Medicine

## 2023-10-21 ENCOUNTER — Other Ambulatory Visit: Payer: Self-pay | Admitting: Bariatrics

## 2023-10-21 ENCOUNTER — Other Ambulatory Visit (HOSPITAL_BASED_OUTPATIENT_CLINIC_OR_DEPARTMENT_OTHER): Payer: Self-pay

## 2023-10-21 ENCOUNTER — Other Ambulatory Visit: Payer: Self-pay

## 2023-10-21 DIAGNOSIS — K9189 Other postprocedural complications and disorders of digestive system: Secondary | ICD-10-CM

## 2023-10-21 MED ORDER — LORAZEPAM 0.5 MG PO TABS
0.5000 mg | ORAL_TABLET | Freq: Two times a day (BID) | ORAL | 0 refills | Status: DC | PRN
  Filled 2023-10-21: qty 30, 15d supply, fill #0

## 2023-10-24 ENCOUNTER — Other Ambulatory Visit (HOSPITAL_BASED_OUTPATIENT_CLINIC_OR_DEPARTMENT_OTHER): Payer: Self-pay

## 2023-10-24 ENCOUNTER — Other Ambulatory Visit: Payer: Self-pay | Admitting: Family Medicine

## 2023-10-24 ENCOUNTER — Encounter (HOSPITAL_BASED_OUTPATIENT_CLINIC_OR_DEPARTMENT_OTHER): Payer: Self-pay

## 2023-10-24 DIAGNOSIS — E66811 Obesity, class 1: Secondary | ICD-10-CM

## 2023-10-24 DIAGNOSIS — G43919 Migraine, unspecified, intractable, without status migrainosus: Secondary | ICD-10-CM

## 2023-10-24 MED ORDER — SUMATRIPTAN SUCCINATE 100 MG PO TABS
ORAL_TABLET | ORAL | 5 refills | Status: DC
  Filled 2023-10-24: qty 10, 30d supply, fill #0
  Filled 2023-11-11 (×2): qty 10, 30d supply, fill #1
  Filled 2023-11-17: qty 9, 30d supply, fill #1
  Filled 2023-12-30: qty 9, 30d supply, fill #2
  Filled 2024-01-24: qty 9, 30d supply, fill #3
  Filled 2024-03-02: qty 9, 30d supply, fill #4
  Filled 2024-03-28: qty 9, 30d supply, fill #5
  Filled 2024-04-27: qty 9, 30d supply, fill #6

## 2023-10-25 ENCOUNTER — Other Ambulatory Visit (HOSPITAL_BASED_OUTPATIENT_CLINIC_OR_DEPARTMENT_OTHER): Payer: Self-pay

## 2023-10-25 ENCOUNTER — Ambulatory Visit: Payer: Commercial Managed Care - PPO | Admitting: Family Medicine

## 2023-10-25 ENCOUNTER — Other Ambulatory Visit: Payer: Self-pay | Admitting: Family Medicine

## 2023-10-25 DIAGNOSIS — E66811 Obesity, class 1: Secondary | ICD-10-CM

## 2023-10-28 ENCOUNTER — Other Ambulatory Visit: Payer: Self-pay | Admitting: Family

## 2023-10-28 DIAGNOSIS — D649 Anemia, unspecified: Secondary | ICD-10-CM

## 2023-10-31 ENCOUNTER — Encounter: Payer: Self-pay | Admitting: Family

## 2023-10-31 ENCOUNTER — Other Ambulatory Visit (HOSPITAL_BASED_OUTPATIENT_CLINIC_OR_DEPARTMENT_OTHER): Payer: Self-pay

## 2023-10-31 ENCOUNTER — Inpatient Hospital Stay: Payer: Commercial Managed Care - PPO | Attending: Hematology & Oncology

## 2023-10-31 ENCOUNTER — Inpatient Hospital Stay (HOSPITAL_BASED_OUTPATIENT_CLINIC_OR_DEPARTMENT_OTHER): Payer: Commercial Managed Care - PPO | Admitting: Family

## 2023-10-31 VITALS — BP 123/73 | HR 60 | Temp 98.5°F | Resp 18 | Ht 66.0 in | Wt 221.0 lb

## 2023-10-31 DIAGNOSIS — Z79899 Other long term (current) drug therapy: Secondary | ICD-10-CM

## 2023-10-31 DIAGNOSIS — R1011 Right upper quadrant pain: Secondary | ICD-10-CM | POA: Diagnosis not present

## 2023-10-31 DIAGNOSIS — Z9884 Bariatric surgery status: Secondary | ICD-10-CM | POA: Diagnosis not present

## 2023-10-31 DIAGNOSIS — D508 Other iron deficiency anemias: Secondary | ICD-10-CM | POA: Diagnosis not present

## 2023-10-31 DIAGNOSIS — D509 Iron deficiency anemia, unspecified: Secondary | ICD-10-CM | POA: Diagnosis not present

## 2023-10-31 DIAGNOSIS — K912 Postsurgical malabsorption, not elsewhere classified: Secondary | ICD-10-CM | POA: Diagnosis not present

## 2023-10-31 DIAGNOSIS — D649 Anemia, unspecified: Secondary | ICD-10-CM

## 2023-10-31 LAB — CMP (CANCER CENTER ONLY)
ALT: 9 U/L (ref 0–44)
AST: 13 U/L — ABNORMAL LOW (ref 15–41)
Albumin: 4.1 g/dL (ref 3.5–5.0)
Alkaline Phosphatase: 76 U/L (ref 38–126)
Anion gap: 8 (ref 5–15)
BUN: 12 mg/dL (ref 8–23)
CO2: 25 mmol/L (ref 22–32)
Calcium: 9.2 mg/dL (ref 8.9–10.3)
Chloride: 107 mmol/L (ref 98–111)
Creatinine: 0.9 mg/dL (ref 0.44–1.00)
GFR, Estimated: >60 mL/min (ref >60)
Glucose, Bld: 103 mg/dL — ABNORMAL HIGH (ref 70–99)
Potassium: 3.9 mmol/L (ref 3.5–5.1)
Sodium: 140 mmol/L (ref 135–145)
Total Bilirubin: 0.3 mg/dL (ref 0.0–1.2)
Total Protein: 7.6 g/dL (ref 6.5–8.1)

## 2023-10-31 LAB — CBC WITH DIFFERENTIAL (CANCER CENTER ONLY)
Abs Immature Granulocytes: 0.07 10*3/uL (ref 0.00–0.07)
Basophils Absolute: 0.1 10*3/uL (ref 0.0–0.1)
Basophils Relative: 1 %
Eosinophils Absolute: 0.1 10*3/uL (ref 0.0–0.5)
Eosinophils Relative: 2 %
HCT: 36.2 % (ref 36.0–46.0)
Hemoglobin: 11.1 g/dL — ABNORMAL LOW (ref 12.0–15.0)
Immature Granulocytes: 1 %
Lymphocytes Relative: 33 %
Lymphs Abs: 2.2 10*3/uL (ref 0.7–4.0)
MCH: 22.5 pg — ABNORMAL LOW (ref 26.0–34.0)
MCHC: 30.7 g/dL (ref 30.0–36.0)
MCV: 73.4 fL — ABNORMAL LOW (ref 80.0–100.0)
Monocytes Absolute: 0.5 10*3/uL (ref 0.1–1.0)
Monocytes Relative: 7 %
Neutro Abs: 3.8 10*3/uL (ref 1.7–7.7)
Neutrophils Relative %: 56 %
Platelet Count: 388 10*3/uL (ref 150–400)
RBC: 4.93 MIL/uL (ref 3.87–5.11)
RDW: 16.5 % — ABNORMAL HIGH (ref 11.5–15.5)
WBC Count: 6.7 10*3/uL (ref 4.0–10.5)
nRBC: 0 % (ref 0.0–0.2)

## 2023-10-31 LAB — RETICULOCYTES
Immature Retic Fract: 15.1 % (ref 2.3–15.9)
RBC.: 4.95 MIL/uL (ref 3.87–5.11)
Retic Count, Absolute: 64.8 10*3/uL (ref 19.0–186.0)
Retic Ct Pct: 1.3 % (ref 0.4–3.1)

## 2023-10-31 LAB — FERRITIN: Ferritin: 5 ng/mL — ABNORMAL LOW (ref 11–307)

## 2023-10-31 NOTE — Progress Notes (Signed)
 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   Iron  saturation is only 5% and ferritin 5.  She is symptomatic with fatigue, dizziness, SOB with exertion, brain fog, chest discomfort/palpitations, fluid retention, tingling in her hands and bruising easily but not in excess.  She has noted bright red blood as well as dark tarry stools and states that she is scheduled for an EGD and colonoscopy later this month for further eval.  No other blood loss noted. No abnormal bruising. She noted some petechiae 1 weeks into a 3 weeks trip to [place]. This resolved on its own and she has had no other issues.  No known familial history of anemia.  No personal history of cancer. Gather had an unknown primary and brother has had skin cancer.  Appetite is good but less on Qsymia . She eats a diet high in protein. She is staying well hydrated. Her weight is 221 lbs.  She has had multiple surgeries in the past including sinus surgery, breast reduction, abdominoplasty and breast lift after weight loss with gastric bypass She has history of GERD and takes Prilosec.  She has chronic migraines and takes Nurtec daily and Imitrex  as needed.  She has generalized aches and pains with both OA and RA.  No diabetes or thyroid  disease.  No fever, chills, n/v, cough, rash, abdominal pain or changes in bladder habits.  No swelling, numbness or tingling in her extremities at this time.  No falls or syncope.  No smoking, ETOH or recreational drug use.  She works in [employer] for [employer].   ROS: All  other 10 point review of systems is negative.   PAST MEDICAL HISTORY:   Past Medical History:  Diagnosis Date   Anemia    Arthritis    Bronchitis 2019   Complication of anesthesia    PONV   Family history of adverse reaction to anesthesia    pt's mother stopped breathing possibly due to sleep apnea   Hiatal hernia    Migraine    Obesity    Simple ovarian cyst    Right    ALLERGIES: Allergies  Allergen Reactions   Sulfa  Antibiotics Anaphylaxis   Sulfamethoxazole -Trimethoprim  Anaphylaxis, Swelling and Rash    Redness and swelling   Bee Venom    Trazodone  And Nefazodone     headache      MEDICATIONS:  Current Outpatient Medications on File Prior to Visit  Medication Sig Dispense Refill   acetaminophen  (TYLENOL ) 650 MG CR tablet Take 1 tablet (650 mg total) by mouth every 8 (eight) hours as needed for pain. 90 tablet 3   AMBULATORY NON FORMULARY MEDICATION Semaglutide  2.5mg  with pyridoxine 10mg  per mL Inject .3mg  once weekly for 4 weeks then increase to .6mg  once weekly for 4 weeks, then .9mg  weekly. 2 mL 1   calcium citrate-vitamin D  200-200 MG-UNIT TABS Take 1 tablet by mouth daily.     Cholecalciferol  1.25 MG (50000 UT) capsule Take 1 capsule (50,000 Units total) by mouth once a week. 12 capsule 3   hydrOXYzine  (VISTARIL ) 25 MG capsule Take  1-2 capsules (25-50 mg total) by mouth at bedtime as needed. 60 capsule 3   iron  polysaccharides (NIFEREX) 150 MG capsule Take 1 capsule (150 mg total) by mouth daily. 30 capsule 0   linaclotide  (LINZESS ) 72 MCG capsule Take 1 capsule (72 mcg total) by mouth daily before breakfast. 30 capsule 0   LORazepam  (ATIVAN ) 0.5 MG tablet Take 1 tablet (0.5 mg total) by mouth 2 (two) times daily as needed for anxiety. 30 tablet 0   MAGNESIUM CITRATE PO Take 1 tablet by mouth 2 (two) times daily.      Multiple Vitamin (MULTIVITAMIN) tablet Take 1 tablet by mouth daily.     Na Sulfate-K Sulfate-Mg Sulf 17.5-3.13-1.6 GM/177ML SOLN Take 177 mLs by  mouth 2 (two) times 354 mL 0   omeprazole  (PRILOSEC) 40 MG capsule Take 1 capsule (40 mg total) by mouth daily. 90 capsule 3   omeprazole  (PRILOSEC) 40 MG capsule Take 1 capsule (40 mg total) by mouth 2 (two) times daily. Take in the morning and at bedtime. 180 capsule 0   Phentermine -Topiramate  (QSYMIA ) 3.75-23 MG CP24 Take 1 capsule by mouth daily. (Patient not taking: Reported on 09/28/2023) 30 capsule 0   Rimegepant Sulfate  (NURTEC) 75 MG TBDP Take 1 tablet (75 mg) by mouth every other day. 45 tablet 1   sertraline  (ZOLOFT ) 100 MG tablet Take 1 tablet (100 mg total) by mouth daily. 90 tablet 1   SUMAtriptan  (IMITREX ) 100 MG tablet TAKE 1 TABLET BY MOUTH EVERY 2 HOURS AS NEEDED FOR MIGRAINE. MAY REPEAT IN 2 HOURS IF HEADACHE PERSISTS OR RECURS. MAX OF 2 TABLETS IN ONE DAY 10 tablet 5   No current facility-administered medications on file prior to visit.     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   Procedure: TOTAL KNEE ARTHROPLASTY;  Surgeon: Rose Herald, MD;  Location: [facility] OR;  Service: Orthopedics;  Laterality: Left;   under arm skin removed     uterine ablation  2012    FAMILY HISTORY: Family History  Problem Relation Age of Onset   Hypertension Mother    Bladder Cancer Mother    Hyperlipidemia Mother    Migraines Mother    Obesity Mother    Rheum arthritis Mother    Hypertension Father    CVA Father    Heart failure Father    Arthritis Father    Hyperlipidemia Father    Kidney failure Father    Heart disease Maternal Grandfather    Lung cancer Paternal Grandmother        lung   Alcohol abuse Paternal Uncle    Diabetes Cousin    Arthritis  Sister    Hypertension Brother    Obesity Brother    Rheum arthritis Brother     SOCIAL HISTORY:  reports that she has never smoked. She has never used smokeless tobacco. She reports current alcohol use of about 1.0 standard drink of alcohol per week. She reports that she does not use drugs.  PERFORMANCE STATUS: The patient's performance status is 1 - Symptomatic but completely ambulatory  PHYSICAL EXAM: Most Recent Vital Signs: There were no vitals  taken for this visit. BP 123/73 (BP Location: Right Arm, Patient Position: Sitting)   Pulse 60   Temp 98.5 F (36.9 C) (Oral)   Resp 18   Ht 5' 6 (1.676 m)   Wt 221 lb (100.2 kg)   SpO2 100%   BMI 35.67 kg/m   General Appearance:    Alert, cooperative, no distress, appears stated age  Head:    Normocephalic, without obvious abnormality, atraumatic  Eyes:    PERRL, conjunctiva/corneas clear, EOM's intact, fundi    benign, both eyes        Throat:   Lips, mucosa, and tongue normal; teeth and gums normal  Neck:   Supple, symmetrical, trachea midline, no adenopathy;    thyroid :  no enlargement/tenderness/nodules; no carotid   bruit or JVD  Back:     Symmetric, no curvature, ROM normal, no CVA tenderness  Lungs:     Clear to auscultation bilaterally, respirations unlabored  Chest Wall:    No tenderness or deformity   Heart:    Regular rate and rhythm, S1 and S2 normal, no murmur, rub   or gallop     Abdomen:     Soft, non-tender, bowel sounds active all four quadrants,    no masses, no organomegaly        Extremities:   Extremities normal, atraumatic, no cyanosis or edema  Pulses:   2+ and symmetric all extremities  Skin:   Skin color, texture, turgor normal, no rashes or lesions  Lymph nodes:   Cervical, supraclavicular, and axillary nodes normal  Neurologic:   CNII-XII intact, normal strength, sensation and reflexes    throughout    LABORATORY DATA:  No results found for this or any previous visit (from the past 48  hours).    RADIOGRAPHY: No results found.     PATHOLOGY: None  ASSESSMENT/PLAN: Ms. Harlacher is a pleasant 63 yo female with 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   We will get her set up for 3 doses of IV iron .  Follow-up in 6 weeks.   All questions were answered. The patient knows to call the clinic with any problems, questions or concerns. We can certainly see the patient much sooner if necessary.   Lauraine Pepper, NP

## 2023-11-01 ENCOUNTER — Encounter: Payer: Self-pay | Admitting: Family

## 2023-11-01 DIAGNOSIS — D509 Iron deficiency anemia, unspecified: Secondary | ICD-10-CM | POA: Insufficient documentation

## 2023-11-01 LAB — IRON AND IRON BINDING CAPACITY (CC-WL,HP ONLY)
Iron: 29 ug/dL (ref 28–170)
Saturation Ratios: 5 % — ABNORMAL LOW (ref 10.4–31.8)
TIBC: 595 ug/dL — ABNORMAL HIGH (ref 250–450)
UIBC: 566 ug/dL — ABNORMAL HIGH (ref 148–442)

## 2023-11-02 LAB — ERYTHROPOIETIN: Erythropoietin: 20.1 m[IU]/mL — ABNORMAL HIGH (ref 2.6–18.5)

## 2023-11-10 ENCOUNTER — Ambulatory Visit (INDEPENDENT_AMBULATORY_CARE_PROVIDER_SITE_OTHER): Payer: Commercial Managed Care - PPO | Admitting: Family Medicine

## 2023-11-10 ENCOUNTER — Encounter: Payer: Self-pay | Admitting: Family Medicine

## 2023-11-10 DIAGNOSIS — R1011 Right upper quadrant pain: Secondary | ICD-10-CM | POA: Diagnosis not present

## 2023-11-10 NOTE — Progress Notes (Signed)
   Acute Office Visit  Subjective:     Patient ID: Rose Long, female    DOB: [birth year], 62 y.o.   MRN: [id number]  Chief Complaint  Patient presents with   Abdominal Pain    HPI Patient is in today for RUQ pain and Righ mid back pain for a couple of weeks.  It has been coming more frequent to the point where the last 2 days its actually been constant.  Sometimes it feels more like a dull ache at other times it is actually painful.  Is not associated with nausea.  He send iron levels were very low with a ferritin of 5.  In fact she is actually scheduled for an iron infusion tomorrow and also scheduled for an upper and lower GI scope on Monday.  Back in November December she was having black stools and occasionally some bright red blood in her stools.  That seems to have actually improved.  She is just felt exhausted.  Sometimes even having to takes quick naps during the daytime.  She feels lightheaded at times and like it is hard to focus and struggling with her memory a little bit she just does not feel well in general.  ROS      Objective:    There were no vitals taken for this visit.   Physical Exam Vitals reviewed.  Constitutional:      Appearance: Normal appearance.  HENT:     Head: Normocephalic.  Pulmonary:     Effort: Pulmonary effort is normal.  Abdominal:     General: Abdomen is flat.     Palpations: Abdomen is soft.     Tenderness: There is abdominal tenderness in the right upper quadrant, right lower quadrant and suprapubic area.     Comments: Do palpate a firmness just right below the rib area.  Possible hepatomegaly.  Neurological:     Mental Status: She is alert and oriented to person, place, and time.  Psychiatric:        Mood and Affect: Mood normal.        Behavior: Behavior normal.     No results found for any visits on 11/10/23.      Assessment & Plan:   Problem List Items Addressed This Visit   None Visit Diagnoses       RUQ pain     -  Primary   Relevant Orders   US Abdomen Complete      Get additional workup starting with an ultrasound to take a look at the gallbladder and the liver.  Can get additional imaging if needed.  Again she has an iron infusion scheduled tomorrow and upper and lower GI scope on Monday to help determine cause of severe iron deficiency anemia.  Liver enzymes in January were normal.  No orders of the defined types were placed in this encounter.   No follow-ups on file.  Nani Gasser, MD

## 2023-11-11 ENCOUNTER — Other Ambulatory Visit (HOSPITAL_BASED_OUTPATIENT_CLINIC_OR_DEPARTMENT_OTHER): Payer: Self-pay

## 2023-11-11 ENCOUNTER — Inpatient Hospital Stay: Payer: Commercial Managed Care - PPO

## 2023-11-11 ENCOUNTER — Ambulatory Visit (HOSPITAL_BASED_OUTPATIENT_CLINIC_OR_DEPARTMENT_OTHER)
Admission: RE | Admit: 2023-11-11 | Discharge: 2023-11-11 | Disposition: A | Discharge status: Home / Self Care | Payer: Commercial Managed Care - PPO | Source: Ambulatory Visit | Attending: Family Medicine | Admitting: Family Medicine

## 2023-11-11 VITALS — BP 121/62 | HR 60 | Temp 98.2°F | Resp 18

## 2023-11-11 DIAGNOSIS — R1011 Right upper quadrant pain: Secondary | ICD-10-CM | POA: Insufficient documentation

## 2023-11-11 DIAGNOSIS — K912 Postsurgical malabsorption, not elsewhere classified: Secondary | ICD-10-CM | POA: Diagnosis not present

## 2023-11-11 DIAGNOSIS — Z9884 Bariatric surgery status: Secondary | ICD-10-CM | POA: Diagnosis not present

## 2023-11-11 DIAGNOSIS — D508 Other iron deficiency anemias: Secondary | ICD-10-CM | POA: Diagnosis not present

## 2023-11-11 DIAGNOSIS — K7689 Other specified diseases of liver: Secondary | ICD-10-CM | POA: Diagnosis not present

## 2023-11-11 DIAGNOSIS — D509 Iron deficiency anemia, unspecified: Secondary | ICD-10-CM

## 2023-11-11 DIAGNOSIS — Z79899 Other long term (current) drug therapy: Secondary | ICD-10-CM | POA: Diagnosis not present

## 2023-11-11 MED ORDER — SODIUM CHLORIDE 0.9 % IV SOLN
300.0000 mg | Freq: Once | INTRAVENOUS | Status: AC
  Administered 2023-11-11: 300 mg via INTRAVENOUS
  Filled 2023-11-11: qty 300

## 2023-11-11 MED ORDER — SODIUM CHLORIDE 0.9 % IV SOLN: INTRAVENOUS | Status: DC

## 2023-11-11 NOTE — Patient Instructions (Signed)

## 2023-11-14 ENCOUNTER — Ambulatory Visit: Payer: Commercial Managed Care - PPO | Admitting: Family Medicine

## 2023-11-14 ENCOUNTER — Encounter: Payer: Self-pay | Admitting: Family Medicine

## 2023-11-14 VITALS — BP 127/82 | HR 60 | Temp 98.5°F | Ht 66.5 in | Wt 220.0 lb

## 2023-11-14 DIAGNOSIS — Z6834 Body mass index (BMI) 34.0-34.9, adult: Secondary | ICD-10-CM | POA: Diagnosis not present

## 2023-11-14 DIAGNOSIS — D508 Other iron deficiency anemias: Secondary | ICD-10-CM | POA: Diagnosis not present

## 2023-11-14 DIAGNOSIS — E6609 Other obesity due to excess calories: Secondary | ICD-10-CM

## 2023-11-14 DIAGNOSIS — Z9884 Bariatric surgery status: Secondary | ICD-10-CM | POA: Diagnosis not present

## 2023-11-14 DIAGNOSIS — K5900 Constipation, unspecified: Secondary | ICD-10-CM | POA: Diagnosis not present

## 2023-11-14 DIAGNOSIS — K9189 Other postprocedural complications and disorders of digestive system: Secondary | ICD-10-CM | POA: Diagnosis not present

## 2023-11-14 DIAGNOSIS — E66811 Obesity, class 1: Secondary | ICD-10-CM

## 2023-11-14 MED ORDER — TIRZEPATIDE-WEIGHT MANAGEMENT 2.5 MG/0.5ML ~~LOC~~ SOLN: 2.5000 mg | SUBCUTANEOUS | 0 refills | Status: DC

## 2023-11-14 NOTE — Progress Notes (Signed)
Hi Kathy, ultrasound looks reassuring some increased fat content in the liver but nothing that should be causing your pain no sign of gallstones or acute inflammation of the gallbladder.  I think you said you were scheduled for your scope this week so hopefully that will be helpful in ruling things in or out.

## 2023-11-14 NOTE — Assessment & Plan Note (Signed)
Hx of chronic constipation dating back to childhood C/o abdominal pain and had recent imaging done showing on fatty liver Scheduled for EGD/ colonoscopy Minimal relief from Linzess  Work on hydrating better with water outside of meals Aim for 2 servings of fresh or frozen fruit and 2+ servings of veggies daily Plan of care per GI

## 2023-11-14 NOTE — Assessment & Plan Note (Signed)
Seeing [name] PA with hematology for IDA post RYGB surgery Off oral iron supplement Has had one of three IV iron infusions thus far C/o fatigue and constipation  F/u with hematology to repeat labs after 3rd IV iron infusion Look for improved energy levels

## 2023-11-14 NOTE — Assessment & Plan Note (Signed)
Unchanged over 2 mos She had the best success on Wegovy for the treatment of post RYGB weight regain but without insurance coverage, this was stopped and she has regained weight  Pre op weight 271 lb and post op nadir weight 180 lb Has some restriction from her bypass and with some inconsistencies in taking supplements due to feeling poorly Scheduled for EGD/ colonoscopy this week with Dr Yevonne Pax  Continue to work on a reduced kcal high protein / low carb diet, limiting sugar Drink water outside of mealtime Resume a bariatric MVI daily (can take chewables) Begin Zepbound AFTER colonoscopy

## 2023-11-14 NOTE — Progress Notes (Signed)
Office: [phone]  /  Fax: [phone]  WEIGHT SUMMARY AND BIOMETRICS  Starting Date: 08/23/23  Starting Weight: 216lb   Weight Lost Since Last Visit: 0lb   Vitals Temp: 98.5 F (36.9 C) BP: 127/82 Pulse Rate: 60 SpO2: 96 %   Body Composition  Body Fat %: 45.5 % Fat Mass (lbs): 100.2 lbs Muscle Mass (lbs): 114 lbs Total Body Water (lbs): 76.2 lbs Visceral Fat Rating : 13   HPI  Chief Complaint: OBESITY  Rose Long is here to discuss her progress with her obesity treatment plan. She is on the the Category 4 Plan and states she is following her eating plan approximately 50 % of the time. She states she is exercising 20 minutes 7 times per week.  Interval History:  Since last office visit she is up 3 lb This gives her a net weight gain of 4 lb in the past 2 mos She has not been feeling well with abdominal pain and migraines She has had some constipation, used Linzess with some relief She is scheduled for EGD/ colonoscopy this week with Dr Yevonne Pax She took Qsymia 3.75/23 mg qAM x 14 days with no change in appetite She has not been taking all of her vitamins due to feeling poorly She is doing some light exercise with minimal knee pain Energy level is poor, s/p 1 IV iron infusion, awaiting 2 more  Pharmacotherapy: none  PHYSICAL EXAM:  Blood pressure 127/82, pulse 60, temperature 98.5 F (36.9 C), height 5' 6.5" (1.689 m), weight 220 lb (99.8 kg), SpO2 96%. Body mass index is 34.98 kg/m.  General: She is overweight, cooperative, alert, well developed, and in no acute distress. PSYCH: Has normal mood, affect and thought process.   Lungs: Normal breathing effort, no conversational dyspnea.   ASSESSMENT AND PLAN  TREATMENT PLAN FOR OBESITY:  Recommended Dietary Goals  Rose Long is currently in the action stage of change. As such, her goal is to continue weight management plan. She has agreed to the Category 3 Plan.  Behavioral Intervention  We discussed  the following Behavioral Modification Strategies today: increasing lean protein intake to established goals, increasing fiber rich foods, increasing water intake , work on meal planning and preparation, keeping healthy foods at home, identifying sources and decreasing liquid calories, practice mindfulness eating and understand the difference between hunger signals and cravings, work on managing stress, creating time for self-care and relaxation, avoiding temptations and identifying enticing environmental cues, planning for success, and continue to work on maintaining a reduced calorie state, getting the recommended amount of protein, incorporating whole foods, making healthy choices, staying well hydrated and practicing mindfulness when eating..  Additional resources provided today: NA  Recommended Physical Activity Goals  Rose Long has been advised to work up to 150 minutes of moderate intensity aerobic activity a week and strengthening exercises 2-3 times per week for cardiovascular health, weight loss maintenance and preservation of muscle mass.   She has agreed to Think about enjoyable ways to increase daily physical activity and overcoming barriers to exercise and Increase physical activity in their day and reduce sedentary time (increase NEAT).  Pharmacotherapy changes for the treatment of obesity: d/c Qsymia Begin Zepbound 2.5 mg vial via [employer] weekly injection Patient denies a personal or family history of pancreatitis, medullary thyroid carcinoma or multiple endocrine neoplasia type II. Recommend reviewing vial training video online. Use Zepbound in combination with cat 3 meal plan + regular exercise  ASSOCIATED CONDITIONS ADDRESSED TODAY  Weight gain following gastric  bypass surgery Assessment & Plan: Unchanged over 2 mos She had the best success on Wegovy for the treatment of post RYGB weight regain but without insurance coverage, this was stopped and she has regained  weight  Pre op weight 271 lb and post op nadir weight 180 lb Has some restriction from her bypass and with some inconsistencies in taking supplements due to feeling poorly Scheduled for EGD/ colonoscopy this week with Dr Yevonne Pax  Continue to work on a reduced kcal high protein / low carb diet, limiting sugar Drink water outside of mealtime Resume a bariatric MVI daily (can take chewables) Begin Zepbound AFTER colonoscopy   Class 1 obesity due to excess calories with body mass index (BMI) of 34.0 to 34.9 in adult, unspecified whether serious comorbidity present -     Tirzepatide-Weight Management; Inject 2.5 mg into the skin once a week.  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   ATTESTASTION STATEMENTS:  Reviewed by clinician on day of visit: allergies, medications, problem list, medical history, surgical history, family history, social history, and previous encounter notes pertinent to obesity diagnosis.   I have personally spent 30 minutes total time today in preparation, patient care, nutritional counseling  and documentation for this visit, including the following: review of clinical lab tests; review of medical tests/procedures/services.      Glennis Brink, DO DABFM, DABOM [facility] [address] [place], [place] [address] [phone]

## 2023-11-17 ENCOUNTER — Other Ambulatory Visit (HOSPITAL_BASED_OUTPATIENT_CLINIC_OR_DEPARTMENT_OTHER): Payer: Self-pay

## 2023-11-18 ENCOUNTER — Inpatient Hospital Stay: Payer: Commercial Managed Care - PPO

## 2023-11-18 ENCOUNTER — Other Ambulatory Visit (HOSPITAL_BASED_OUTPATIENT_CLINIC_OR_DEPARTMENT_OTHER): Payer: Self-pay

## 2023-11-18 VITALS — BP 144/89 | HR 58 | Temp 98.5°F | Resp 18

## 2023-11-18 DIAGNOSIS — D509 Iron deficiency anemia, unspecified: Secondary | ICD-10-CM

## 2023-11-18 DIAGNOSIS — Z860101 Personal history of adenomatous and serrated colon polyps: Secondary | ICD-10-CM | POA: Diagnosis not present

## 2023-11-18 DIAGNOSIS — K912 Postsurgical malabsorption, not elsewhere classified: Secondary | ICD-10-CM | POA: Diagnosis not present

## 2023-11-18 DIAGNOSIS — K222 Esophageal obstruction: Secondary | ICD-10-CM | POA: Diagnosis not present

## 2023-11-18 DIAGNOSIS — K648 Other hemorrhoids: Secondary | ICD-10-CM | POA: Diagnosis not present

## 2023-11-18 DIAGNOSIS — D508 Other iron deficiency anemias: Secondary | ICD-10-CM | POA: Diagnosis not present

## 2023-11-18 DIAGNOSIS — K208 Other esophagitis without bleeding: Secondary | ICD-10-CM | POA: Diagnosis not present

## 2023-11-18 DIAGNOSIS — R1011 Right upper quadrant pain: Secondary | ICD-10-CM | POA: Diagnosis not present

## 2023-11-18 DIAGNOSIS — Z9884 Bariatric surgery status: Secondary | ICD-10-CM | POA: Diagnosis not present

## 2023-11-18 DIAGNOSIS — Z79899 Other long term (current) drug therapy: Secondary | ICD-10-CM | POA: Diagnosis not present

## 2023-11-18 MED ORDER — SODIUM CHLORIDE 0.9 % IV SOLN
300.0000 mg | Freq: Once | INTRAVENOUS | Status: AC
  Administered 2023-11-18: 300 mg via INTRAVENOUS
  Filled 2023-11-18: qty 300

## 2023-11-18 MED ORDER — SODIUM CHLORIDE 0.9 % IV SOLN
INTRAVENOUS | Status: DC
Start: 2023-11-18 — End: 2023-11-18

## 2023-11-18 MED ORDER — LINZESS 145 MCG PO CAPS
145.0000 ug | ORAL_CAPSULE | Freq: Every day | ORAL | 11 refills | Status: DC
  Filled 2023-11-18: qty 30, 30d supply, fill #0
  Filled 2023-12-30: qty 30, 30d supply, fill #1
  Filled 2024-02-15: qty 30, 30d supply, fill #2

## 2023-11-18 NOTE — Patient Instructions (Signed)

## 2023-11-28 ENCOUNTER — Encounter (HOSPITAL_BASED_OUTPATIENT_CLINIC_OR_DEPARTMENT_OTHER): Payer: Self-pay

## 2023-11-28 ENCOUNTER — Inpatient Hospital Stay: Payer: Commercial Managed Care - PPO | Attending: Family

## 2023-11-28 ENCOUNTER — Other Ambulatory Visit (HOSPITAL_BASED_OUTPATIENT_CLINIC_OR_DEPARTMENT_OTHER): Payer: Self-pay

## 2023-11-28 ENCOUNTER — Other Ambulatory Visit: Payer: Self-pay | Admitting: Bariatrics

## 2023-11-28 VITALS — BP 120/56 | HR 58 | Temp 98.1°F | Resp 18

## 2023-11-28 DIAGNOSIS — Z79899 Other long term (current) drug therapy: Secondary | ICD-10-CM | POA: Diagnosis not present

## 2023-11-28 DIAGNOSIS — Z9884 Bariatric surgery status: Secondary | ICD-10-CM | POA: Diagnosis not present

## 2023-11-28 DIAGNOSIS — K909 Intestinal malabsorption, unspecified: Secondary | ICD-10-CM | POA: Diagnosis not present

## 2023-11-28 DIAGNOSIS — D509 Iron deficiency anemia, unspecified: Secondary | ICD-10-CM | POA: Insufficient documentation

## 2023-11-28 MED ORDER — SODIUM CHLORIDE 0.9 % IV SOLN
300.0000 mg | Freq: Once | INTRAVENOUS | Status: AC
  Administered 2023-11-28: 300 mg via INTRAVENOUS
  Filled 2023-11-28: qty 300

## 2023-11-28 MED ORDER — SODIUM CHLORIDE 0.9 % IV SOLN: INTRAVENOUS | Status: DC

## 2023-11-28 NOTE — Patient Instructions (Signed)

## 2023-12-01 ENCOUNTER — Other Ambulatory Visit (HOSPITAL_BASED_OUTPATIENT_CLINIC_OR_DEPARTMENT_OTHER): Payer: Self-pay

## 2023-12-02 ENCOUNTER — Other Ambulatory Visit: Payer: Self-pay | Admitting: Family Medicine

## 2023-12-02 DIAGNOSIS — E66811 Obesity, class 1: Secondary | ICD-10-CM

## 2023-12-05 ENCOUNTER — Other Ambulatory Visit (HOSPITAL_BASED_OUTPATIENT_CLINIC_OR_DEPARTMENT_OTHER): Payer: Self-pay

## 2023-12-12 ENCOUNTER — Inpatient Hospital Stay (HOSPITAL_BASED_OUTPATIENT_CLINIC_OR_DEPARTMENT_OTHER): Payer: Commercial Managed Care - PPO | Admitting: Family

## 2023-12-12 ENCOUNTER — Encounter: Payer: Self-pay | Admitting: Family

## 2023-12-12 ENCOUNTER — Other Ambulatory Visit: Payer: Self-pay

## 2023-12-12 ENCOUNTER — Other Ambulatory Visit (HOSPITAL_BASED_OUTPATIENT_CLINIC_OR_DEPARTMENT_OTHER): Payer: Self-pay

## 2023-12-12 ENCOUNTER — Inpatient Hospital Stay: Payer: Commercial Managed Care - PPO

## 2023-12-12 VITALS — BP 113/72 | HR 55 | Temp 98.6°F | Resp 17 | Wt 219.0 lb

## 2023-12-12 DIAGNOSIS — D509 Iron deficiency anemia, unspecified: Secondary | ICD-10-CM

## 2023-12-12 DIAGNOSIS — Z9884 Bariatric surgery status: Secondary | ICD-10-CM | POA: Diagnosis not present

## 2023-12-12 DIAGNOSIS — Z79899 Other long term (current) drug therapy: Secondary | ICD-10-CM | POA: Diagnosis not present

## 2023-12-12 DIAGNOSIS — K909 Intestinal malabsorption, unspecified: Secondary | ICD-10-CM | POA: Diagnosis not present

## 2023-12-12 LAB — CBC WITH DIFFERENTIAL (CANCER CENTER ONLY)
Abs Immature Granulocytes: 0.05 10*3/uL (ref 0.00–0.07)
Basophils Absolute: 0.1 10*3/uL (ref 0.0–0.1)
Basophils Relative: 1 %
Eosinophils Absolute: 0.2 10*3/uL (ref 0.0–0.5)
Eosinophils Relative: 3 %
HCT: 41.7 % (ref 36.0–46.0)
Hemoglobin: 13.1 g/dL (ref 12.0–15.0)
Immature Granulocytes: 1 %
Lymphocytes Relative: 40 %
Lymphs Abs: 2.3 10*3/uL (ref 0.7–4.0)
MCH: 24.6 pg — ABNORMAL LOW (ref 26.0–34.0)
MCHC: 31.4 g/dL (ref 30.0–36.0)
MCV: 78.4 fL — ABNORMAL LOW (ref 80.0–100.0)
Monocytes Absolute: 0.4 10*3/uL (ref 0.1–1.0)
Monocytes Relative: 6 %
Neutro Abs: 2.9 10*3/uL (ref 1.7–7.7)
Neutrophils Relative %: 49 %
Platelet Count: 314 10*3/uL (ref 150–400)
RBC: 5.32 MIL/uL — ABNORMAL HIGH (ref 3.87–5.11)
RDW: 22.3 % — ABNORMAL HIGH (ref 11.5–15.5)
WBC Count: 5.9 10*3/uL (ref 4.0–10.5)
nRBC: 0 % (ref 0.0–0.2)

## 2023-12-12 LAB — IRON AND IRON BINDING CAPACITY (CC-WL,HP ONLY)
Iron: 105 ug/dL (ref 28–170)
Saturation Ratios: 25 % (ref 10.4–31.8)
TIBC: 423 ug/dL (ref 250–450)
UIBC: 318 ug/dL (ref 148–442)

## 2023-12-12 LAB — FERRITIN: Ferritin: 81 ng/mL (ref 11–307)

## 2023-12-12 LAB — RETICULOCYTES
Immature Retic Fract: 8.2 % (ref 2.3–15.9)
RBC.: 5.24 MIL/uL — ABNORMAL HIGH (ref 3.87–5.11)
Retic Count, Absolute: 59.7 10*3/uL (ref 19.0–186.0)
Retic Ct Pct: 1.1 % (ref 0.4–3.1)

## 2023-12-12 NOTE — Progress Notes (Signed)
 Hematology and Oncology Follow Up Visit  Rose Long [id number] October 18, 1961 63 y.o. 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 She is feeling better since receiving IV iron.  She has not noted any blood loss. No bruising or petechiae.  No fever, chills, n/v, cough, rash, dizziness, SOB, chest pain, palpitations or changes in bowel or bladder habits.  No swelling, tenderness, numbness or tingling in her extremities.  No falls or syncope.  Appetite and hydration are good. Weight is 219 lbs.   ECOG Performance Status: 1 - Symptomatic but completely ambulatory  Medications:  Allergies as of 12/12/2023       Reactions   Bee Venom Swelling   Swelling of face, lips, and eyes   Sulfa Antibiotics Anaphylaxis   Sulfamethoxazole-trimethoprim Anaphylaxis, Swelling, Rash   Face,lip, eyes swollen. Does not remember if tongue or throat were swollen.   Trazodone And Nefazodone Other (See Comments)   headache        Medication List        Accurate as of December 12, 2023  9:01 AM. If you have any questions, ask your nurse or doctor.          acetaminophen 650 MG CR tablet Commonly known as: TYLENOL Take 1 tablet (650 mg total) by mouth every 8 (eight) hours as needed for pain.   AMBULATORY NON FORMULARY MEDICATION Semaglutide 2.5mg  with pyridoxine 10mg  per mL Inject .3mg  once weekly for 4 weeks then increase to .6mg  once weekly for 4 weeks, then .9mg  weekly.   calcium citrate-vitamin D 200-200 MG-UNIT Tabs Take 1 tablet by mouth daily.   hydrOXYzine 25 MG capsule Commonly known as: VISTARIL Take 1-2 capsules (25-50 mg total) by mouth at bedtime as needed.   Linzess 72 MCG capsule Generic drug: linaclotide Take 1 capsule (72 mcg total) by mouth daily before breakfast.   Linzess 145 MCG Caps  capsule Generic drug: linaclotide Take 1 capsule (145 mcg total) by mouth daily before breakfast.   LORazepam 0.5 MG tablet Commonly known as: Ativan Take 1 tablet (0.5 mg total) by mouth 2 (two) times daily as needed for anxiety.   MAGNESIUM CITRATE PO Take 1 tablet by mouth 2 (two) times daily.   multivitamin tablet Take 1 tablet by mouth daily.   Nurtec 75 MG Tbdp Generic drug: Rimegepant Sulfate Take 1 tablet (75 mg) by mouth every other day.   omeprazole 40 MG capsule Commonly known as: PRILOSEC Take 1 capsule (40 mg total) by mouth 2 (two) times daily. Take in the morning and at bedtime. What changed:  when to take this additional instructions   sertraline 100 MG tablet Commonly known as: ZOLOFT Take 1 tablet (100 mg total) by mouth daily.   SUMAtriptan 100 MG tablet Commonly known as: IMITREX TAKE 1 TABLET BY MOUTH EVERY 2 HOURS AS NEEDED FOR MIGRAINE. MAY REPEAT IN 2 HOURS IF HEADACHE PERSISTS OR RECURS. MAX OF 2 TABLETS IN ONE DAY   tirzepatide 2.5 MG/0.5ML injection vial Commonly known as: ZEPBOUND Inject 2.5 mg into the skin once a week.   Vitamin D3 1.25 MG (50000 UT) Caps Take 1 capsule (50,000 Units total) by mouth once a week.        Allergies:  Allergies  Allergen Reactions   Bee Venom Swelling    Swelling of  face, lips, and eyes   Sulfa Antibiotics Anaphylaxis   Sulfamethoxazole-Trimethoprim Anaphylaxis, Swelling and Rash    Face,lip, eyes swollen. Does not remember if tongue or throat were swollen.   Trazodone And Nefazodone Other (See Comments)    headache    Past Medical History, Surgical history, Social history, and Family History were reviewed and updated.  Review of Systems: All other 10 point review of systems is negative.   Physical Exam:  weight is 219 lb (99.3 kg). Her oral temperature is 98.6 F (37 C). Her blood pressure is 113/72 and her pulse is 55 (abnormal). Her respiration is 17 and oxygen saturation is 99%.   Wt  Readings from Last 3 Encounters:  12/12/23 219 lb (99.3 kg)  11/14/23 220 lb (99.8 kg)  10/31/23 221 lb (100.2 kg)    Ocular: Sclerae unicteric, pupils equal, round and reactive to light Ear-nose-throat: Oropharynx clear, dentition fair Lymphatic: No cervical or supraclavicular adenopathy Lungs no rales or rhonchi, good excursion bilaterally Heart regular rate and rhythm, no murmur appreciated Abd soft, nontender, positive bowel sounds MSK no focal spinal tenderness, no joint edema Neuro: non-focal, well-oriented, appropriate affect Breasts: Deferred   Lab Results  Component Value Date   WBC 5.9 12/12/2023   HGB 13.1 12/12/2023   HCT 41.7 12/12/2023   MCV 78.4 (L) 12/12/2023   PLT 314 12/12/2023   Lab Results  Component Value Date   FERRITIN 5 (L) 10/31/2023   IRON 29 10/31/2023   TIBC 595 (H) 10/31/2023   UIBC 566 (H) 10/31/2023   IRONPCTSAT 5 (L) 10/31/2023   Lab Results  Component Value Date   RETICCTPCT 1.1 12/12/2023   RBC 5.24 (H) 12/12/2023   No results found for: "KPAFRELGTCHN", "LAMBDASER", "KAPLAMBRATIO" No results found for: "IGGSERUM", "IGA", "IGMSERUM" No results found for: "TOTALPROTELP", "ALBUMINELP", "A1GS", "A2GS", "BETS", "BETA2SER", "GAMS", "MSPIKE", "SPEI"   Chemistry      Component Value Date/Time   NA 140 10/31/2023 1512   NA 141 06/02/2023 0947   K 3.9 10/31/2023 1512   CL 107 10/31/2023 1512   CO2 25 10/31/2023 1512   BUN 12 10/31/2023 1512   BUN 14 06/02/2023 0947   CREATININE 0.90 10/31/2023 1512   CREATININE 0.81 03/30/2022 1023      Component Value Date/Time   CALCIUM 9.2 10/31/2023 1512   ALKPHOS 76 10/31/2023 1512   AST 13 (L) 10/31/2023 1512   ALT 9 10/31/2023 1512   BILITOT 0.3 10/31/2023 1512       Impression and Plan:  Rose Long is a pleasant 63 yo female with 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   Iron studies are pending. We will replace if needed.  Follow-up in 3 months.   Eileen Stanford,  NP 2/24/20259:01 AM

## 2023-12-13 ENCOUNTER — Encounter: Payer: Self-pay | Admitting: Family Medicine

## 2023-12-13 ENCOUNTER — Ambulatory Visit: Payer: Commercial Managed Care - PPO | Admitting: Family Medicine

## 2023-12-13 VITALS — BP 132/84 | HR 57 | Temp 98.0°F | Ht 66.5 in | Wt 216.0 lb

## 2023-12-13 DIAGNOSIS — Z9884 Bariatric surgery status: Secondary | ICD-10-CM

## 2023-12-13 DIAGNOSIS — E559 Vitamin D deficiency, unspecified: Secondary | ICD-10-CM | POA: Diagnosis not present

## 2023-12-13 DIAGNOSIS — E66811 Obesity, class 1: Secondary | ICD-10-CM

## 2023-12-13 DIAGNOSIS — D508 Other iron deficiency anemias: Secondary | ICD-10-CM | POA: Diagnosis not present

## 2023-12-13 DIAGNOSIS — K9189 Other postprocedural complications and disorders of digestive system: Secondary | ICD-10-CM | POA: Diagnosis not present

## 2023-12-13 DIAGNOSIS — R635 Abnormal weight gain: Secondary | ICD-10-CM

## 2023-12-13 DIAGNOSIS — Z6834 Body mass index (BMI) 34.0-34.9, adult: Secondary | ICD-10-CM

## 2023-12-13 MED ORDER — TIRZEPATIDE-WEIGHT MANAGEMENT 5 MG/0.5ML ~~LOC~~ SOLN: 5.0000 mg | SUBCUTANEOUS | 0 refills | Status: DC

## 2023-12-13 NOTE — Progress Notes (Signed)
 Office: [phone]  /  Fax: [phone]  WEIGHT SUMMARY AND BIOMETRICS  Starting Date: 08/23/23  Starting Weight: 216lb   Weight Lost Since Last Visit: 4lb   Vitals Temp: 98 F (36.7 C) BP: 132/84 Pulse Rate: (!) 57 SpO2: 100 %   Body Composition  Body Fat %: 44.8 % Fat Mass (lbs): 96.8 lbs Muscle Mass (lbs): 113.4 lbs Total Body Water (lbs): 74.4 lbs Visceral Fat Rating : 13    HPI  Chief Complaint: OBESITY  Cleona is here to discuss her progress with her obesity treatment plan. She is on the the Category 3 Plan and states she is following her eating plan approximately 80 % of the time. She states she is exercising 20 minutes 5 times per week.  Interval History:  Since last office visit she is down 4 lb She is down 0.6 pounds of muscle mass and down 3.4 pounds of body fat since her last visit This gives her a net weight loss of 0 pounds in the past 3 months She started Zepbound 2.5 mg x 4 weeks She has been enjoying the satiety Cravings have greatly reduced Denies N/V She has improved energy since having 3 IV iron infusions  Pharmacotherapy: Zepbound 2.5 mg once weekly injections, cash pay  PHYSICAL EXAM:  Blood pressure 132/84, pulse (!) 57, temperature 98 F (36.7 C), height 5' 6.5" (1.689 m), weight 216 lb (98 kg), SpO2 100%. Body mass index is 34.34 kg/m.  General: She is overweight, cooperative, alert, well developed, and in no acute distress. PSYCH: Has normal mood, affect and thought process.   Lungs: Normal breathing effort, no conversational dyspnea.   ASSESSMENT AND PLAN  TREATMENT PLAN FOR OBESITY:  Recommended Dietary Goals  Jeannine is currently in the action stage of change. As such, her goal is to continue weight management plan. She has agreed to the Category 3 Plan.  Behavioral Intervention  We discussed the following Behavioral Modification Strategies today: increasing lean protein intake to established goals, increasing  fiber rich foods, increasing water intake , work on meal planning and preparation, keeping healthy foods at home, practice mindfulness eating and understand the difference between hunger signals and cravings, work on managing stress, creating time for self-care and relaxation, avoiding temptations and identifying enticing environmental cues, continue to work on implementation of reduced calorie nutritional plan, and continue to work on maintaining a reduced calorie state, getting the recommended amount of protein, incorporating whole foods, making healthy choices, staying well hydrated and practicing mindfulness when eating..  Additional resources provided today: NA  Recommended Physical Activity Goals  Teva has been advised to work up to 150 minutes of moderate intensity aerobic activity a week and strengthening exercises 2-3 times per week for cardiovascular health, weight loss maintenance and preservation of muscle mass.   She has agreed to Think about enjoyable ways to increase daily physical activity and overcoming barriers to exercise and Increase physical activity in their day and reduce sedentary time (increase NEAT).  Pharmacotherapy changes for the treatment of obesity: Increase Zepbound to 5 mg once weekly injection  ASSOCIATED CONDITIONS ADDRESSED TODAY  Weight gain following gastric bypass surgery Improving Patient has been enjoying the added satiety from Zepbound 2.5 mg once weekly injection, happy to see weight reduction after struggling with postop regain since her gastric bypass surgery in 2013.  She is trying to get back down to her previous nadir weight of 180 pounds.  She has been prioritizing lean protein and fiber with meals, meal  planning and drinking water outside of mealtime.  She has minimized her intake of sugar and plans to increase her exercise frequency this spring.  Continue current healthy behavior changes, current prescribed dietary plan and increase Zepbound  to 5 mg once weekly injection  Class 1 obesity due to excess calories with body mass index (BMI) of 34.0 to 34.9 in adult, unspecified whether serious comorbidity present -     Tirzepatide-Weight Management; Inject 5 mg into the skin once a week.  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  Patient recently had labs redone by hematology, managed by Eileen Stanford, NP. She has received 3 IV iron infusions.  Her energy level has improved.  She is now able to add in some walking for exercise.  Continue on a bariatric multivitamin once daily.  Continue routine follow-up with hematology as scheduled.  Vitamin D deficiency Last vitamin D Lab Results  Component Value Date   VD25OH 23.9 (L) 06/02/2023   She is currently on vitamin D 50,000 IU once weekly by her PCP. Her energy level is starting to improve.  Plan to recheck vitamin D level with next set of labs approximately 2 months     She was informed of the importance of frequent follow up visits to maximize her success with intensive lifestyle modifications for her multiple health conditions.   ATTESTASTION STATEMENTS:  Reviewed by clinician on day of visit: allergies, medications, problem list, medical history, surgical history, family history, social history, and previous encounter notes pertinent to obesity diagnosis.   I have personally spent 30 minutes total time today in preparation, patient care, nutritional counseling and documentation for this visit, including the following: review of clinical lab tests; review of medical tests/procedures/services.      Glennis Brink, DO DABFM, DABOM [facility] [address] [place], [place] [address] [phone]

## 2023-12-30 ENCOUNTER — Other Ambulatory Visit: Payer: Self-pay | Admitting: Family Medicine

## 2023-12-30 ENCOUNTER — Other Ambulatory Visit (HOSPITAL_BASED_OUTPATIENT_CLINIC_OR_DEPARTMENT_OTHER): Payer: Self-pay

## 2023-12-30 DIAGNOSIS — E66811 Obesity, class 1: Secondary | ICD-10-CM

## 2024-01-10 ENCOUNTER — Other Ambulatory Visit (HOSPITAL_BASED_OUTPATIENT_CLINIC_OR_DEPARTMENT_OTHER): Payer: Self-pay

## 2024-01-10 ENCOUNTER — Ambulatory Visit (INDEPENDENT_AMBULATORY_CARE_PROVIDER_SITE_OTHER): Payer: Commercial Managed Care - PPO | Admitting: Family Medicine

## 2024-01-10 ENCOUNTER — Encounter: Payer: Self-pay | Admitting: Family Medicine

## 2024-01-10 VITALS — BP 109/73 | HR 68 | Temp 98.1°F | Ht 66.5 in | Wt 213.0 lb

## 2024-01-10 DIAGNOSIS — Z6833 Body mass index (BMI) 33.0-33.9, adult: Secondary | ICD-10-CM

## 2024-01-10 DIAGNOSIS — D508 Other iron deficiency anemias: Secondary | ICD-10-CM

## 2024-01-10 DIAGNOSIS — E66811 Obesity, class 1: Secondary | ICD-10-CM | POA: Diagnosis not present

## 2024-01-10 DIAGNOSIS — R7303 Prediabetes: Secondary | ICD-10-CM

## 2024-01-10 DIAGNOSIS — K9189 Other postprocedural complications and disorders of digestive system: Secondary | ICD-10-CM | POA: Diagnosis not present

## 2024-01-10 DIAGNOSIS — Z9884 Bariatric surgery status: Secondary | ICD-10-CM | POA: Diagnosis not present

## 2024-01-10 MED ORDER — METFORMIN HCL ER 500 MG PO TB24
500.0000 mg | ORAL_TABLET | Freq: Every day | ORAL | 0 refills | Status: DC
  Filled 2024-01-10: qty 30, 30d supply, fill #0

## 2024-01-10 NOTE — Progress Notes (Signed)
 Office: [phone]  /  Fax: [phone]  WEIGHT SUMMARY AND BIOMETRICS  Starting Date: 08/23/23  Starting Weight: 216lb   Weight Lost Since Last Visit: 3lb   Vitals Temp: 98.1 F (36.7 C) BP: 109/73 Pulse Rate: 68 SpO2: 98 %   Body Composition  Body Fat %: 37.5 % Fat Mass (lbs): 80 lbs Muscle Mass (lbs): 126.6 lbs Total Body Water (lbs): 74.2 lbs Visceral Fat Rating : 11    HPI  Chief Complaint: OBESITY  Rose Long is here to discuss her progress with her obesity treatment plan. She is on the the Category 3 Plan and states she is following her eating plan approximately 80 % of the time. She states she is exercising 30 minutes 6 times per week.  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  She is still up from her nadir weight of 180 pounds.  Hunger and cravings have improved on Zepbound Complains of high cost She has received 3 IV iron infusions and has had EGD and colonoscopy done this year.  Pharmacotherapy: Zepbound 5 mg once weekly injection  PHYSICAL EXAM:  Blood pressure 109/73, pulse 68, temperature 98.1 F (36.7 C), height 5' 6.5" (1.689 m), weight 213 lb (96.6 kg), SpO2 98%. Body mass index is 33.86 kg/m.  General: She is overweight, cooperative, alert, well developed, and in no acute distress. PSYCH: Has normal mood, affect and thought process.   Lungs: Normal breathing effort, no conversational dyspnea.   ASSESSMENT AND PLAN  TREATMENT PLAN FOR OBESITY:  Recommended Dietary Goals  Rose Long is currently in the action stage of change. As such, her goal is to continue weight management plan. She has agreed to the Category 3 Plan.  Behavioral  Intervention  We discussed the following Behavioral Modification Strategies today: increasing lean protein intake to established goals, increasing fiber rich foods, increasing water intake , work on meal planning and preparation, work on Counselling psychologist calories using tracking application, keeping healthy foods at home, practice mindfulness eating and understand the difference between hunger signals and cravings, work on managing stress, creating time for self-care and relaxation, avoiding temptations and identifying enticing environmental cues, continue to work on implementation of reduced calorie nutritional plan, and continue to work on maintaining a reduced calorie state, getting the recommended amount of protein, incorporating whole foods, making healthy choices, staying well hydrated and practicing mindfulness when eating..  Additional resources provided today: NA  Recommended Physical Activity Goals  Rose Long has been advised to work up to 150 minutes of moderate intensity aerobic activity a week and strengthening exercises 2-3 times per week for cardiovascular health, weight loss maintenance and preservation of muscle mass.   She has agreed to [employer] strengthening exercises with a goal of 2-3 sessions a week   Pharmacotherapy changes for the treatment of obesity: discontinue Zepbound after 4 weeks are complete due to high cost  ASSOCIATED CONDITIONS ADDRESSED TODAY  Prediabetes Lab Results  Component Value Date   HGBA1C 5.9 (H) 08/23/2023  Patient has a history of prediabetes and has never used metformin.  She will be coming off of Zepbound due to high cost and lacks insurance coverage for alternative GLP-1 receptor agonist.  She is a good candidate for use of metformin along with a reduced calorie high-protein low carbohydrate diet.  Continue to work on regular exercise to include both cardio and resistance training at least 3 days a week  We discussed potential adverse side  effects of metformin.  Plan to repeat labs in 2 months  -     metFORMIN HCl ER; Take 1 tablet (500 mg total) by mouth daily with breakfast.  Dispense: 30 tablet; Refill: 0  Class 1 obesity due to excess calories with serious comorbidity and body mass index (BMI) of 33.0 to 33.9 in adult  Weight gain following gastric bypass surgery She is still working on postop regain, surgery done 2013 at [facility] with a preop weight of 271 pounds and a nadir weight of 180 pounds.  She is compliant with taking a multivitamin daily.  She has required IV iron infusions and additional vitamin D, prescribed by her PCP.  Annual bariatric labs are reviewed and up-to-date.  She has had complications of ulcerative esophagitis, managed by Dr. 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  Her energy level is stable. Keep upcoming visit with Eileen Stanford, NP 03/09/2024.  Continue a bariatric multivitamin once daily   She was informed of the importance of frequent follow up visits to maximize her success with intensive lifestyle modifications for her multiple health conditions.   ATTESTASTION STATEMENTS:  Reviewed by clinician on day of visit: allergies, medications, problem list, medical history, surgical history, family history, social history, and previous encounter notes pertinent to obesity diagnosis.   I have personally spent 30 minutes total time today in preparation, patient care, nutritional counseling and documentation for this visit, including the following: review of clinical lab tests; review of medical tests/procedures/services.      Rose Brink, DO DABFM, DABOM [facility] [address] [place], [place] [address] [phone]

## 2024-01-13 ENCOUNTER — Other Ambulatory Visit: Payer: Self-pay | Admitting: Family Medicine

## 2024-01-13 DIAGNOSIS — G43919 Migraine, unspecified, intractable, without status migrainosus: Secondary | ICD-10-CM

## 2024-01-13 DIAGNOSIS — F411 Generalized anxiety disorder: Secondary | ICD-10-CM

## 2024-01-18 ENCOUNTER — Other Ambulatory Visit (HOSPITAL_BASED_OUTPATIENT_CLINIC_OR_DEPARTMENT_OTHER): Payer: Self-pay

## 2024-01-18 MED ORDER — NURTEC 75 MG PO TBDP
75.0000 mg | ORAL_TABLET | ORAL | 1 refills | Status: AC
  Filled 2024-01-18: qty 45, 90d supply, fill #0
  Filled 2024-01-20: qty 15, 30d supply, fill #0
  Filled 2024-02-15: qty 15, 30d supply, fill #1

## 2024-01-18 NOTE — Telephone Encounter (Unsigned)
 Last OV: 11/10/23 Next OV: no appt scheduled Last RF:  10/21/23

## 2024-01-19 ENCOUNTER — Other Ambulatory Visit (HOSPITAL_BASED_OUTPATIENT_CLINIC_OR_DEPARTMENT_OTHER): Payer: Self-pay

## 2024-01-19 MED ORDER — LORAZEPAM 0.5 MG PO TABS
0.5000 mg | ORAL_TABLET | Freq: Two times a day (BID) | ORAL | 0 refills | Status: DC | PRN
  Filled 2024-01-19: qty 30, 15d supply, fill #0

## 2024-01-20 ENCOUNTER — Other Ambulatory Visit (HOSPITAL_BASED_OUTPATIENT_CLINIC_OR_DEPARTMENT_OTHER): Payer: Self-pay

## 2024-01-24 ENCOUNTER — Other Ambulatory Visit (HOSPITAL_COMMUNITY): Payer: Self-pay

## 2024-02-15 ENCOUNTER — Other Ambulatory Visit (HOSPITAL_BASED_OUTPATIENT_CLINIC_OR_DEPARTMENT_OTHER): Payer: Self-pay

## 2024-02-15 ENCOUNTER — Encounter: Payer: Self-pay | Admitting: Family Medicine

## 2024-02-15 ENCOUNTER — Other Ambulatory Visit: Payer: Self-pay

## 2024-02-15 ENCOUNTER — Encounter: Payer: Self-pay | Admitting: Family

## 2024-02-15 ENCOUNTER — Other Ambulatory Visit: Payer: Self-pay | Admitting: Family Medicine

## 2024-02-15 ENCOUNTER — Ambulatory Visit: Admitting: Family Medicine

## 2024-02-15 VITALS — BP 111/76 | HR 75 | Temp 98.0°F | Ht 66.5 in | Wt 207.0 lb

## 2024-02-15 DIAGNOSIS — K912 Postsurgical malabsorption, not elsewhere classified: Secondary | ICD-10-CM | POA: Diagnosis not present

## 2024-02-15 DIAGNOSIS — R7303 Prediabetes: Secondary | ICD-10-CM

## 2024-02-15 DIAGNOSIS — E559 Vitamin D deficiency, unspecified: Secondary | ICD-10-CM

## 2024-02-15 DIAGNOSIS — E538 Deficiency of other specified B group vitamins: Secondary | ICD-10-CM

## 2024-02-15 DIAGNOSIS — E6609 Other obesity due to excess calories: Secondary | ICD-10-CM

## 2024-02-15 DIAGNOSIS — E66811 Obesity, class 1: Secondary | ICD-10-CM | POA: Diagnosis not present

## 2024-02-15 DIAGNOSIS — G479 Sleep disorder, unspecified: Secondary | ICD-10-CM

## 2024-02-15 DIAGNOSIS — Z6832 Body mass index (BMI) 32.0-32.9, adult: Secondary | ICD-10-CM

## 2024-02-15 DIAGNOSIS — F411 Generalized anxiety disorder: Secondary | ICD-10-CM

## 2024-02-15 MED ORDER — SERTRALINE HCL 100 MG PO TABS
100.0000 mg | ORAL_TABLET | Freq: Every day | ORAL | 1 refills | Status: DC
  Filled 2024-02-15: qty 90, 90d supply, fill #0
  Filled 2024-07-19: qty 90, 90d supply, fill #1

## 2024-02-15 MED ORDER — METFORMIN HCL ER 500 MG PO TB24
500.0000 mg | ORAL_TABLET | Freq: Every day | ORAL | 0 refills | Status: DC
  Filled 2024-02-15: qty 90, 90d supply, fill #0

## 2024-02-15 MED ORDER — HYDROXYZINE PAMOATE 25 MG PO CAPS
25.0000 mg | ORAL_CAPSULE | Freq: Every evening | ORAL | 3 refills | Status: DC | PRN
  Filled 2024-02-15: qty 60, 30d supply, fill #0
  Filled 2024-03-28: qty 60, 30d supply, fill #1
  Filled 2024-04-27: qty 60, 30d supply, fill #2
  Filled 2024-06-11: qty 60, 30d supply, fill #3

## 2024-02-15 MED ORDER — WEGOVY 2.4 MG/0.75ML ~~LOC~~ SOAJ: 2.4000 mg | SUBCUTANEOUS | 0 refills | Status: DC

## 2024-02-15 NOTE — Progress Notes (Signed)
 Office: [phone]  /  Fax: [phone]  WEIGHT SUMMARY AND BIOMETRICS  Starting Date: 08/23/23  Starting Weight: 216lb   Weight Lost Since Last Visit: 6lb   Vitals Temp: 98 F (36.7 C) BP: 111/76 Pulse Rate: 75 SpO2: 97 %   Body Composition  Body Fat %: 36.4 % Fat Mass (lbs): 75.4 lbs Muscle Mass (lbs): 125.2 lbs Total Body Water (lbs): 75.2 lbs Visceral Fat Rating : 10   HPI  Chief Complaint: OBESITY  Rose Long is here to discuss her progress with her obesity treatment plan. She is on the the Category 3 Plan and states she is following her eating plan approximately 80 % of the time. She states she is exercising 30 minutes 5 times per week.  Interval History:  Since last office visit she is down 6 lb This gives her a net weight loss of 9 lb in the past 5 mos of medically supervised weight management She did come off of Zepbound and found some extra Wegovy  2.4 mg pens x 3 She did all 3 injections and did not feel sick She is still making healthy food choices She did feel improved satiety She has been walking outdoors 30 min 5 days/ wk Sugar cravings did improve on Wegovy  (not as much reduction with Zepbound) Energy level remains low after 3 IV iron  infusions She was not seeing adequate satiety or weight loss on Zepbound cash pay She would like to get back down to her nadir weight post gastric bypass of 180 lb.  Pharmacotherapy: metformin  XR 500 mg daily -- tolerating new start  PHYSICAL EXAM:  Blood pressure 111/76, pulse 75, temperature 98 F (36.7 C), height 5' 6.5" (1.689 m), weight 207 lb (93.9 kg), SpO2 97%. Body mass index is 32.91 kg/m.  General: She is overweight, cooperative, alert, well developed, and in no acute distress. PSYCH: Has normal mood, affect and thought process.   Lungs: Normal breathing effort, no conversational dyspnea.  ASSESSMENT AND PLAN  TREATMENT PLAN FOR OBESITY:  Recommended Dietary Goals  Rose Long is currently in  the action stage of change. As such, her goal is to continue weight management plan. She has agreed to the Category 3 Plan.  Behavioral Intervention  We discussed the following Behavioral Modification Strategies today: increasing lean protein intake to established goals, increasing fiber rich foods, avoiding skipping meals, increasing water intake , work on meal planning and preparation, keeping healthy foods at home, identifying sources and decreasing liquid calories, work on managing stress, creating time for self-care and relaxation, avoiding temptations and identifying enticing environmental cues, continue to practice mindfulness when eating, and continue to work on maintaining a reduced calorie state, getting the recommended amount of protein, incorporating whole foods, making healthy choices, staying well hydrated and practicing mindfulness when eating.. Cut out sweet tea!  Additional resources provided today: NA  Recommended Physical Activity Goals  Rose Long has been advised to work up to 150 minutes of moderate intensity aerobic activity a week and strengthening exercises 2-3 times per week for cardiovascular health, weight loss maintenance and preservation of muscle mass.   She has agreed to [employer] strengthening exercises with a goal of 2-3 sessions a week  and Increase the intensity, frequency or duration of aerobic exercises   Great job with more consistent walking  Pharmacotherapy changes for the treatment of obesity: resume Wegovy  2.4 mg weekly injection via [facility] pharmacy  ASSOCIATED CONDITIONS ADDRESSED TODAY  Vitamin D  deficiency Last vitamin D  Lab Results  Component Value Date  VD25OH 23.9 (L) 06/02/2023  She is taking Rx vitamin D  weekly thru her PCP Repeat vitamin D  level in the next 2-3 mos with a goal >50  -     VITAMIN D  25 Hydroxy (Vit-D Deficiency, Fractures)  Prediabetes Lab Results  Component Value Date   HGBA1C 5.9 (H) 08/23/2023  She is tolerating  metformin  start well at 500 mg XR daily with food while working on dietary change, increasing walking time and weight loss Labs ordered  -     Hemoglobin A1c -     metFORMIN  HCl ER; Take 1 tablet (500 mg total) by mouth daily with breakfast.  Dispense: 90 tablet; Refill: 0  Low serum vitamin B12 Lab Results  Component Value Date   VITAMINB12 287 06/02/2023  B12 deficiency post RYGB -- has not been getting B12 injection monthly and she c/o profound fatigue and brain fog.  Denies paresthesias.  Continue a [place] -     Vitamin B12  Postoperative intestinal malabsorption Continue a bariatric MVI daily F/u with hematology to recheck iron  levels Continue RX vitamin D  weekly and resume vitamin B12 injections monthly -     Prealbumin -     Comprehensive metabolic panel with GFR  Class 1 obesity due to excess calories with body mass index (BMI) of 32.0 to 32.9 in adult, unspecified whether serious comorbidity present -     Wegovy ; Inject 2.4 mg into the skin once a week.  Dispense: 3 mL; Refill: 0      She was informed of the importance of frequent follow up visits to maximize her success with intensive lifestyle modifications for her multiple health conditions.   ATTESTASTION STATEMENTS:  Reviewed by clinician on day of visit: allergies, medications, problem list, medical history, surgical history, family history, social history, and previous encounter notes pertinent to obesity diagnosis.   I have personally spent 30 minutes total time today in preparation, patient care, nutritional counseling and education,  and documentation for this visit, including the following: review of most recent clinical lab tests, prescribing medications/ refilling medications, reviewing medical assistant documentation, review and interpretation of bioimpedence results.     Micky Albee, D.O. DABFM, DABOM [facility] [address] [place], [place] [address] [phone]

## 2024-02-15 NOTE — Patient Instructions (Signed)
 Begin Magnesium Glycinate at night  Resume B12 injections monthly Continue a Bariatric Multivitamin daily

## 2024-02-16 DIAGNOSIS — K912 Postsurgical malabsorption, not elsewhere classified: Secondary | ICD-10-CM | POA: Diagnosis not present

## 2024-02-17 ENCOUNTER — Other Ambulatory Visit (HOSPITAL_COMMUNITY): Payer: Self-pay

## 2024-02-17 LAB — COMPREHENSIVE METABOLIC PANEL WITH GFR
ALT: 11 IU/L (ref 0–32)
AST: 15 IU/L (ref 0–40)
Albumin: 4.2 g/dL (ref 3.9–4.9)
Alkaline Phosphatase: 76 IU/L (ref 44–121)
BUN/Creatinine Ratio: 18 (ref 12–28)
BUN: 13 mg/dL (ref 8–27)
Bilirubin Total: 0.2 mg/dL (ref 0.0–1.2)
CO2: 21 mmol/L (ref 20–29)
Calcium: 9.9 mg/dL (ref 8.7–10.3)
Chloride: 106 mmol/L (ref 96–106)
Creatinine, Ser: 0.73 mg/dL (ref 0.57–1.00)
Globulin, Total: 2.7 g/dL (ref 1.5–4.5)
Glucose: 87 mg/dL (ref 70–99)
Potassium: 4.5 mmol/L (ref 3.5–5.2)
Sodium: 141 mmol/L (ref 134–144)
Total Protein: 6.9 g/dL (ref 6.0–8.5)
eGFR: 94 mL/min/{1.73_m2} (ref >59)

## 2024-02-29 ENCOUNTER — Ambulatory Visit: Payer: Self-pay | Admitting: *Deleted

## 2024-02-29 NOTE — Telephone Encounter (Signed)
-----   Message from Luana Rumple sent at 02/27/2024  3:47 PM EDT ----- Can you help me find where the rest of her labs went? Thanks Dr Ambrosio Junker ----- Message ----- From: Interface, [facility] Lab Results In Sent: 02/17/2024   5:38 AM EDT To: Luana Rumple, DO

## 2024-02-29 NOTE — Telephone Encounter (Signed)
 Phone call to patient to let her know that they did not draw all her labs when she had them drawn. She is going to come in tomorrow to get them drawn and she has her follow up appointment next Wednesday.

## 2024-03-02 ENCOUNTER — Other Ambulatory Visit (HOSPITAL_BASED_OUTPATIENT_CLINIC_OR_DEPARTMENT_OTHER): Payer: Self-pay

## 2024-03-05 ENCOUNTER — Other Ambulatory Visit (HOSPITAL_BASED_OUTPATIENT_CLINIC_OR_DEPARTMENT_OTHER): Payer: Self-pay

## 2024-03-05 ENCOUNTER — Encounter: Payer: Self-pay | Admitting: Family

## 2024-03-05 DIAGNOSIS — K2210 Ulcer of esophagus without bleeding: Secondary | ICD-10-CM | POA: Diagnosis not present

## 2024-03-05 DIAGNOSIS — K5900 Constipation, unspecified: Secondary | ICD-10-CM | POA: Diagnosis not present

## 2024-03-05 DIAGNOSIS — R1310 Dysphagia, unspecified: Secondary | ICD-10-CM | POA: Diagnosis not present

## 2024-03-05 DIAGNOSIS — D509 Iron deficiency anemia, unspecified: Secondary | ICD-10-CM | POA: Diagnosis not present

## 2024-03-05 DIAGNOSIS — Z860100 Personal history of colon polyps, unspecified: Secondary | ICD-10-CM | POA: Diagnosis not present

## 2024-03-05 DIAGNOSIS — Z9884 Bariatric surgery status: Secondary | ICD-10-CM | POA: Diagnosis not present

## 2024-03-05 MED ORDER — FAMOTIDINE 20 MG PO TABS
20.0000 mg | ORAL_TABLET | Freq: Every day | ORAL | 3 refills | Status: AC | PRN
  Filled 2024-03-05: qty 90, 90d supply, fill #0
  Filled 2024-04-27 – 2024-06-11 (×2): qty 90, 90d supply, fill #1
  Filled 2024-09-26: qty 90, 90d supply, fill #2

## 2024-03-05 MED ORDER — TRULANCE 3 MG PO TABS
3.0000 mg | ORAL_TABLET | Freq: Every day | ORAL | 5 refills | Status: AC
  Filled 2024-03-05: qty 30, 30d supply, fill #0
  Filled 2024-04-27: qty 30, 30d supply, fill #1
  Filled 2024-06-11: qty 30, 30d supply, fill #2
  Filled 2024-07-19: qty 30, 30d supply, fill #3
  Filled 2024-08-17: qty 30, 30d supply, fill #4

## 2024-03-07 ENCOUNTER — Encounter: Payer: Self-pay | Admitting: Family Medicine

## 2024-03-07 ENCOUNTER — Ambulatory Visit: Admitting: Family Medicine

## 2024-03-07 VITALS — BP 117/80 | HR 61 | Temp 98.6°F | Ht 66.5 in | Wt 208.0 lb

## 2024-03-07 DIAGNOSIS — R5383 Other fatigue: Secondary | ICD-10-CM | POA: Diagnosis not present

## 2024-03-07 DIAGNOSIS — Z9884 Bariatric surgery status: Secondary | ICD-10-CM | POA: Diagnosis not present

## 2024-03-07 DIAGNOSIS — Z6833 Body mass index (BMI) 33.0-33.9, adult: Secondary | ICD-10-CM | POA: Diagnosis not present

## 2024-03-07 DIAGNOSIS — E559 Vitamin D deficiency, unspecified: Secondary | ICD-10-CM | POA: Diagnosis not present

## 2024-03-07 DIAGNOSIS — E66811 Obesity, class 1: Secondary | ICD-10-CM

## 2024-03-07 DIAGNOSIS — E538 Deficiency of other specified B group vitamins: Secondary | ICD-10-CM | POA: Diagnosis not present

## 2024-03-07 DIAGNOSIS — R7303 Prediabetes: Secondary | ICD-10-CM | POA: Diagnosis not present

## 2024-03-07 DIAGNOSIS — K912 Postsurgical malabsorption, not elsewhere classified: Secondary | ICD-10-CM | POA: Diagnosis not present

## 2024-03-07 DIAGNOSIS — E6609 Other obesity due to excess calories: Secondary | ICD-10-CM

## 2024-03-07 NOTE — Progress Notes (Signed)
 Office: [phone]  /  Fax: [phone]  WEIGHT SUMMARY AND BIOMETRICS  Starting Date: 08/23/23  Starting Weight: 216lb   Weight Lost Since Last Visit: 0lb   Vitals Temp: 98.6 F (37 C) BP: 117/80 Pulse Rate: 61 SpO2: 98 %   Body Composition  Body Fat %: 37.8 % Fat Mass (lbs): 78.8 lbs Muscle Mass (lbs): 123.2 lbs Total Body Water (lbs): 74 lbs Visceral Fat Rating : 11   HPI  Chief Complaint: OBESITY  Rose Long is here to discuss her progress with her obesity treatment plan. She is on the the Category 3 Plan and states she is following her eating plan approximately 60 % of the time. She states she is exercising 30 minutes 5 times per week.  Interval History:  Since last office visit she is up 1 lb She is down 2 lb of muscle mass and up 3.4 lb of body fat since last visit This gives her a net weight loss of 8 lb in the past 6 mos of medically supervised weight management She admits to 'not doing well' the past few weeks She lacks a good support system She has been working longer house She plans to do more outdoor walking She has had a 2 week gap without Wegovy  with an increase in hunger R knee pain is improving Tolerating Metformin  XR 500 mg well without problem  Pharmacotherapy: Wegovy  2.4 mg weekly (cash pay, starting this week x 4 weeks)  PHYSICAL EXAM:  Blood pressure 117/80, pulse 61, temperature 98.6 F (37 C), height 5' 6.5" (1.689 m), weight 208 lb (94.3 kg), SpO2 98%. Body mass index is 33.07 kg/m.  General: She is overweight, cooperative, alert, well developed, and in no acute distress. PSYCH: Has normal mood, affect and thought process.   Lungs: Normal breathing effort, no conversational dyspnea.   ASSESSMENT AND PLAN  TREATMENT PLAN FOR OBESITY:  Recommended Dietary Goals  Miela is currently in the action stage of change. As such, her goal is to continue weight management plan. She has agreed to the Category 3 Plan.  Behavioral  Intervention  We discussed the following Behavioral Modification Strategies today: increasing lean protein intake to established goals, increasing fiber rich foods, avoiding skipping meals, increasing water intake , work on meal planning and preparation, keeping healthy foods at home, decreasing eating out or consumption of processed foods, and making healthy choices when eating convenient foods, practice mindfulness eating and understand the difference between hunger signals and cravings, work on managing stress, creating time for self-care and relaxation, avoiding temptations and identifying enticing environmental cues, continue to work on implementation of reduced calorie nutritional plan, continue to practice mindfulness when eating, planning for success, and continue to work on maintaining a reduced calorie state, getting the recommended amount of protein, incorporating whole foods, making healthy choices, staying well hydrated and practicing mindfulness when eating..  Additional resources provided today: NA  Recommended Physical Activity Goals  Retaj has been advised to work up to 150 minutes of moderate intensity aerobic activity a week and strengthening exercises 2-3 times per week for cardiovascular health, weight loss maintenance and preservation of muscle mass.   She has agreed to Think about enjoyable ways to increase daily physical activity and overcoming barriers to exercise and Increase physical activity in their day and reduce sedentary time (increase NEAT). Plan to add in 30 min of outdoor walking 5 days/ wk  Pharmacotherapy changes for the treatment of obesity: none  ASSOCIATED CONDITIONS ADDRESSED TODAY  Weight gain  following gastric bypass surgery Stable She has hx of RYGB surgery at [facility] 2013 with a pre op weiht of 271, nadir weight 180 lb and weight regain She has been working on improving food choices with a~1500 cal per day prescribed diet, prioritizing lean protein and  fiber with fair success She has room for improvement with exercise Barriers to success have included lack of support at home, job stress and feelings of fatigue  Class 1 obesity due to excess calories with body mass index (BMI) of 33.0 to 33.9 in adult, unspecified whether serious comorbidity present  Prediabetes Lab Results  Component Value Date   HGBA1C 5.9 (H) 08/23/2023   Look for improvements in A1c, redrawn today with dietary change, exercise and weight reduction She is tolerating metformin  XR 500 mg daily well and her CMP from 5/1 was reviewed and normal  Other fatigue She is scheduled for hematology follow up 5/23  after 3 IV iron  infusion, she remains fatigued and this has limited her ability to add in more exercise  Continue a bariatric MVI daily and aim for 8 hrs of high quality sleep at night   She was informed of the importance of frequent follow up visits to maximize her success with intensive lifestyle modifications for her multiple health conditions.   ATTESTASTION STATEMENTS:  Reviewed by clinician on day of visit: allergies, medications, problem list, medical history, surgical history, family history, social history, and previous encounter notes pertinent to obesity diagnosis.   I have personally spent 30 minutes total time today in preparation, patient care, nutritional counseling and education,  and documentation for this visit, including the following: review of most recent clinical lab tests,  reviewing medical assistant documentation, review and interpretation of bioimpedence results.     Micky Albee, D.O. DABFM, DABOM [facility] [address] [place], [place] [address] [phone]

## 2024-03-08 ENCOUNTER — Ambulatory Visit (INDEPENDENT_AMBULATORY_CARE_PROVIDER_SITE_OTHER): Admitting: Family Medicine

## 2024-03-08 ENCOUNTER — Encounter: Payer: Self-pay | Admitting: Family Medicine

## 2024-03-08 VITALS — BP 120/65 | HR 55 | Ht 65.5 in | Wt 207.0 lb

## 2024-03-08 DIAGNOSIS — T7840XA Allergy, unspecified, initial encounter: Secondary | ICD-10-CM

## 2024-03-08 LAB — VITAMIN B12: Vitamin B-12: 227 pg/mL — ABNORMAL LOW (ref 232–1245)

## 2024-03-08 LAB — PREALBUMIN: PREALBUMIN: 20 mg/dL (ref 10–36)

## 2024-03-08 LAB — VITAMIN D 25 HYDROXY (VIT D DEFICIENCY, FRACTURES): Vit D, 25-Hydroxy: 50.2 ng/mL (ref 30.0–100.0)

## 2024-03-08 LAB — HEMOGLOBIN A1C
Est. average glucose Bld gHb Est-mCnc: 120 mg/dL
Hgb A1c MFr Bld: 5.8 % — ABNORMAL HIGH (ref 4.8–5.6)

## 2024-03-08 MED ORDER — PREDNISONE 20 MG PO TABS: 40.0000 mg | ORAL_TABLET | Freq: Every day | ORAL | 0 refills | Status: DC

## 2024-03-08 NOTE — Patient Instructions (Signed)
 Let us  know if you start getting any drainage, discharge in the eyes or vision change. Please fill the prednisone  if the Zyrtec does not seem to be helping or if the rash seems to be spreading.

## 2024-03-08 NOTE — Progress Notes (Signed)
   Acute Office Visit  Subjective:     Patient ID: Rose Long, female    DOB: 09-13-1961, 63 y.o.   MRN: [id number]  Chief Complaint  Patient presents with   Belepharitis    Pt reports that she was sitting outside last night with no problems this morning she said she noticed that her eyes were swollen,red,and itchy. She also reports that her neck is itchy. She hasn't taken anything for this.     HPI Patient is in today for itchy red skin around her eyes and now on her neck she was sitting outside last night.  She did not send think she came into contact with anything there was no freshly cut grass around her.  She has not taken any medication yet.  No drainage or crusting from the eyes.  She had noted that she had a lot of itching around her neck for the last 2 days before her eye symptoms.  She denies any new soaps face lotions etc.  ROS      Objective:    BP 120/65   Pulse (!) 55   Ht 5' 5.5" (1.664 m)   Wt 207 lb (93.9 kg)   SpO2 100%   BMI 33.92 kg/m    Physical Exam Vitals reviewed.  Constitutional:      Appearance: Normal appearance.  HENT:     Head: Normocephalic.  Pulmonary:     Effort: Pulmonary effort is normal.  Skin:    Comments: She has significant erythema all the way around her left eye upper lid and lower lid onto the facial cheekbone area and some erythema underneath the right eye and on the lower lid.  The conjunctive on the left is a little pink.  No discharge.  She has a little bit of erythema on the left side of her neck.  No discrete hives.  Lids are very swollen.  Neurological:     Mental Status: She is alert and oriented to person, place, and time.  Psychiatric:        Mood and Affect: Mood normal.        Behavior: Behavior normal.     No results found for any visits on 03/08/24.      Assessment & Plan:   Problem List Items Addressed This Visit   None Visit Diagnoses       Allergic rash present on examination    -  Primary       Rash most consistent with an allergic reaction.  Recommend to treat with oral antihistamines start Zyrtec 10 mg twice a day.  If not improving okay to start prednisone  or start prednisone  if rash starts spreading over arms torso etc.  Recommend cool compresses on the eyes let us  know if any drainage discharge or vision change.  Meds ordered this encounter  Medications   predniSONE  (DELTASONE ) 20 MG tablet    Sig: Take 2 tablets (40 mg total) by mouth daily with breakfast.    Dispense:  10 tablet    Refill:  0    No follow-ups on file.  Duaine German, MD

## 2024-03-09 ENCOUNTER — Inpatient Hospital Stay: Payer: Commercial Managed Care - PPO | Attending: Family

## 2024-03-09 ENCOUNTER — Inpatient Hospital Stay (HOSPITAL_BASED_OUTPATIENT_CLINIC_OR_DEPARTMENT_OTHER): Payer: Commercial Managed Care - PPO | Admitting: Family

## 2024-03-09 VITALS — BP 130/81 | HR 70 | Temp 97.8°F | Resp 17 | Ht 65.5 in | Wt 211.0 lb

## 2024-03-09 DIAGNOSIS — D508 Other iron deficiency anemias: Secondary | ICD-10-CM | POA: Diagnosis not present

## 2024-03-09 DIAGNOSIS — Z9884 Bariatric surgery status: Secondary | ICD-10-CM | POA: Insufficient documentation

## 2024-03-09 DIAGNOSIS — K909 Intestinal malabsorption, unspecified: Secondary | ICD-10-CM | POA: Diagnosis not present

## 2024-03-09 DIAGNOSIS — D509 Iron deficiency anemia, unspecified: Secondary | ICD-10-CM

## 2024-03-09 LAB — RETICULOCYTES
Immature Retic Fract: 4.2 % (ref 2.3–15.9)
RBC.: 4.97 MIL/uL (ref 3.87–5.11)
Retic Count, Absolute: 51.2 10*3/uL (ref 19.0–186.0)
Retic Ct Pct: 1 % (ref 0.4–3.1)

## 2024-03-09 LAB — CBC WITH DIFFERENTIAL (CANCER CENTER ONLY)
Abs Immature Granulocytes: 0.02 10*3/uL (ref 0.00–0.07)
Basophils Absolute: 0 10*3/uL (ref 0.0–0.1)
Basophils Relative: 1 %
Eosinophils Absolute: 0.1 10*3/uL (ref 0.0–0.5)
Eosinophils Relative: 2 %
HCT: 41.4 % (ref 36.0–46.0)
Hemoglobin: 13.5 g/dL (ref 12.0–15.0)
Immature Granulocytes: 0 %
Lymphocytes Relative: 28 %
Lymphs Abs: 1.7 10*3/uL (ref 0.7–4.0)
MCH: 26.8 pg (ref 26.0–34.0)
MCHC: 32.6 g/dL (ref 30.0–36.0)
MCV: 82.1 fL (ref 80.0–100.0)
Monocytes Absolute: 0.3 10*3/uL (ref 0.1–1.0)
Monocytes Relative: 5 %
Neutro Abs: 4.1 10*3/uL (ref 1.7–7.7)
Neutrophils Relative %: 64 %
Platelet Count: 292 10*3/uL (ref 150–400)
RBC: 5.04 MIL/uL (ref 3.87–5.11)
RDW: 15.4 % (ref 11.5–15.5)
WBC Count: 6.3 10*3/uL (ref 4.0–10.5)
nRBC: 0 % (ref 0.0–0.2)

## 2024-03-09 LAB — IRON AND IRON BINDING CAPACITY (CC-WL,HP ONLY)
Iron: 75 ug/dL (ref 28–170)
Saturation Ratios: 18 % (ref 10.4–31.8)
TIBC: 409 ug/dL (ref 250–450)
UIBC: 334 ug/dL (ref 148–442)

## 2024-03-09 LAB — FERRITIN: Ferritin: 70 ng/mL (ref 11–307)

## 2024-03-09 NOTE — Progress Notes (Signed)
 Hematology and Oncology Follow Up Visit  Rose Long [id number] 1961/02/13 63 y.o. 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 Unfortunately she has gotten exposed to something that causes an itchy rash on her face, both arm and chest. Her eyes are also quite swollen. She was able to see her PCP and started zyrtec and a steroid taper today.  She will also give benadryl  PO and calamine lotion a try.  No trouble swallowing. No swelling of the tongue or throat.  No fever, chills, cough, SOB, chest pain, palpitations, abdominal pain or changes in bowel or bladder habits.  She has fatigue which she feels is chronic due to long Covid.  She has chronic nausea and dizziness that come and go.  IBS unchanged from baseline. She is now taking Linzess .  No numbness or tingling in her extremities.  No falls or syncope reported.  Appetite and hydration are good. Weight is stable at 211 lbs.   ECOG Performance Status: 1 - Symptomatic but completely ambulatory  Medications:  Allergies as of 03/09/2024       Reactions   Bee Venom Swelling   Swelling of face, lips, and eyes   Sulfa  Antibiotics Anaphylaxis   Sulfamethoxazole -trimethoprim  Anaphylaxis, Swelling, Rash   Face,lip, eyes swollen. Does not remember if tongue or throat were swollen.   Trazodone  And Nefazodone Other (See Comments)   headache        Medication List        Accurate as of Mar 09, 2024  9:05 AM. If you have any questions, ask your nurse or doctor.          calcium citrate-vitamin D  200-200 MG-UNIT Tabs Take 1 tablet by mouth daily.   famotidine  20 MG tablet Commonly known as: PEPCID  Take 1 tablet (20 mg total) by mouth daily as needed for heartburn.   hydrOXYzine  25 MG capsule Commonly known as: VISTARIL  Take 1-2 capsules  (25-50 mg total) by mouth at bedtime as needed.   Linzess  72 MCG capsule Generic drug: linaclotide  Take 1 capsule (72 mcg total) by mouth daily before breakfast.   Linzess  145 MCG Caps capsule Generic drug: linaclotide  Take 1 capsule (145 mcg total) by mouth daily before breakfast.   LORazepam  0.5 MG tablet Commonly known as: Ativan  Take 1 tablet (0.5 mg total) by mouth 2 (two) times daily as needed for anxiety.   MAGNESIUM CITRATE PO Take 1 tablet by mouth 2 (two) times daily.   metFORMIN  500 MG 24 hr tablet Commonly known as: GLUCOPHAGE -XR Take 1 tablet (500 mg total) by mouth daily with breakfast.   multivitamin tablet Take 1 tablet by mouth daily.   Nurtec 75 MG Tbdp Generic drug: Rimegepant Sulfate  Take 1 tablet (75 mg) by mouth every other day.   omeprazole  40 MG capsule Commonly known as: PRILOSEC Take 1 capsule (40 mg total) by mouth 2 (two) times daily. Take in the morning and at bedtime. What changed:  when to take this additional instructions   predniSONE  20 MG tablet Commonly known as: DELTASONE  Take 2 tablets (40 mg total) by mouth daily with breakfast.   sertraline  100 MG tablet Commonly known as: ZOLOFT  Take 1 tablet (100 mg total) by mouth daily.   SUMAtriptan  100 MG tablet Commonly known as: IMITREX  TAKE  1 TABLET BY MOUTH EVERY 2 HOURS AS NEEDED FOR MIGRAINE. MAY REPEAT IN 2 HOURS IF HEADACHE PERSISTS OR RECURS. MAX OF 2 TABLETS IN ONE DAY   Trulance  3 MG Tabs Generic drug: Plecanatide  Take 1 tablet (3 mg total) by mouth daily.   Vitamin D3 1.25 MG (50000 UT) Caps Take 1 capsule (50,000 Units total) by mouth once a week.   Wegovy  2.4 MG/0.75ML Soaj Generic drug: Semaglutide -Weight Management Inject 2.4 mg into the skin once a week.        Allergies:  Allergies  Allergen Reactions   Bee Venom Swelling    Swelling of face, lips, and eyes   Sulfa  Antibiotics Anaphylaxis   Sulfamethoxazole -Trimethoprim  Anaphylaxis, Swelling and Rash     Face,lip, eyes swollen. Does not remember if tongue or throat were swollen.   Trazodone  And Nefazodone Other (See Comments)    headache    Past Medical History, Surgical history, Social history, and Family History were reviewed and updated.  Review of Systems: All other 10 point review of systems is negative.   Physical Exam:  height is 5' 5.5" (1.664 m) and weight is 211 lb (95.7 kg). Her oral temperature is 97.8 F (36.6 C). Her blood pressure is 130/81 and her pulse is 70. Her respiration is 17 and oxygen saturation is 99%.   Wt Readings from Last 3 Encounters:  03/09/24 211 lb (95.7 kg)  03/08/24 207 lb (93.9 kg)  03/07/24 208 lb (94.3 kg)    Ocular: Sclerae unicteric, pupils equal, round and reactive to light Ear-nose-throat: Oropharynx clear, dentition fair Lymphatic: No cervical or supraclavicular adenopathy Lungs no rales or rhonchi, good excursion bilaterally Heart regular rate and rhythm, no murmur appreciated Abd soft, nontender, positive bowel sounds MSK no focal spinal tenderness, no joint edema Neuro: non-focal, well-oriented, appropriate affect Breasts: Deferred   Lab Results  Component Value Date   WBC 6.3 03/09/2024   HGB 13.5 03/09/2024   HCT 41.4 03/09/2024   MCV 82.1 03/09/2024   PLT 292 03/09/2024   Lab Results  Component Value Date   FERRITIN 81 12/12/2023   IRON  105 12/12/2023   TIBC 423 12/12/2023   UIBC 318 12/12/2023   IRONPCTSAT 25 12/12/2023   Lab Results  Component Value Date   RETICCTPCT 1.0 03/09/2024   RBC 5.04 03/09/2024   No results found for: "KPAFRELGTCHN", "LAMBDASER", "KAPLAMBRATIO" No results found for: "IGGSERUM", "IGA", "IGMSERUM" No results found for: "TOTALPROTELP", "ALBUMINELP", "A1GS", "A2GS", "BETS", "BETA2SER", "GAMS", "MSPIKE", "SPEI"   Chemistry      Component Value Date/Time   NA 141 02/16/2024 1431   K 4.5 02/16/2024 1431   CL 106 02/16/2024 1431   CO2 21 02/16/2024 1431   BUN 13 02/16/2024 1431    CREATININE 0.73 02/16/2024 1431   CREATININE 0.90 10/31/2023 1512   CREATININE 0.81 03/30/2022 1023      Component Value Date/Time   CALCIUM 9.9 02/16/2024 1431   ALKPHOS 76 02/16/2024 1431   AST 15 02/16/2024 1431   AST 13 (L) 10/31/2023 1512   ALT 11 02/16/2024 1431   ALT 9 10/31/2023 1512   BILITOT 0.2 02/16/2024 1431   BILITOT 0.3 10/31/2023 1512       Impression and Plan:  Rose Long is a pleasant 63 yo female with 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   Iron  studies are pending. We will replace if needed.  Follow-up in 4 months.  Kennard Pea, NP 5/23/20259:05 AM

## 2024-03-15 ENCOUNTER — Other Ambulatory Visit (HOSPITAL_BASED_OUTPATIENT_CLINIC_OR_DEPARTMENT_OTHER): Payer: Self-pay

## 2024-03-15 ENCOUNTER — Encounter: Payer: Self-pay | Admitting: Family Medicine

## 2024-03-15 ENCOUNTER — Telehealth: Payer: Self-pay | Admitting: Family Medicine

## 2024-03-15 MED ORDER — PREDNISONE 20 MG PO TABS
40.0000 mg | ORAL_TABLET | Freq: Every day | ORAL | 0 refills | Status: DC
  Filled 2024-03-15: qty 10, 5d supply, fill #0

## 2024-03-15 NOTE — Telephone Encounter (Signed)
 Since since fore red, woollen, itchy eyes broke out with large red itchy patches on her arms she is pretty sure that it is actually poison sumac.  She finished her prednisone  day before yesterday.  Send over another round of prednisone .   Meds ordered this encounter  Medications   predniSONE  (DELTASONE ) 20 MG tablet    Sig: Take 2 tablets (40 mg total) by mouth daily with breakfast.    Dispense:  10 tablet    Refill:  0

## 2024-03-28 ENCOUNTER — Telehealth: Payer: Self-pay

## 2024-03-28 ENCOUNTER — Other Ambulatory Visit: Payer: Self-pay

## 2024-03-28 ENCOUNTER — Other Ambulatory Visit: Payer: Self-pay | Admitting: Family Medicine

## 2024-03-28 DIAGNOSIS — F411 Generalized anxiety disorder: Secondary | ICD-10-CM

## 2024-03-28 MED ORDER — LORAZEPAM 0.5 MG PO TABS
0.5000 mg | ORAL_TABLET | Freq: Two times a day (BID) | ORAL | 0 refills | Status: DC | PRN
Start: 2024-03-28 — End: 2024-06-11
  Filled 2024-03-28: qty 30, 15d supply, fill #0

## 2024-03-28 NOTE — Telephone Encounter (Signed)
 Last OV: 03/08/24 Next OV: no appt scheduled Last RF: 01/19/24

## 2024-03-28 NOTE — Telephone Encounter (Signed)
 Received voicemail from patient requesting help for a rejected claim from [employer].  This RN attempted to call patient back multiple times with it going straight to voicemail.  VM left for patient to call back.

## 2024-03-29 ENCOUNTER — Other Ambulatory Visit (HOSPITAL_BASED_OUTPATIENT_CLINIC_OR_DEPARTMENT_OTHER): Payer: Self-pay

## 2024-03-29 ENCOUNTER — Other Ambulatory Visit: Payer: Self-pay

## 2024-03-30 ENCOUNTER — Ambulatory Visit: Admitting: Sports Medicine

## 2024-03-30 VITALS — BP 137/81 | HR 55 | Temp 97.8°F

## 2024-03-30 DIAGNOSIS — H9201 Otalgia, right ear: Secondary | ICD-10-CM

## 2024-03-30 MED ORDER — PHENYLEPHRINE HCL 10 MG PO TABS: 10.0000 mg | ORAL_TABLET | Freq: Three times a day (TID) | ORAL | 0 refills | Status: DC

## 2024-03-30 MED ORDER — FLUTICASONE PROPIONATE 50 MCG/ACT NA SUSP: NASAL | 3 refills | Status: AC

## 2024-03-30 NOTE — Assessment & Plan Note (Signed)
 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 Hearing is somewhat blunted, no tinnitus, no dizziness or vertiginous symptoms. She has noted some allergy symptoms such as rhinitis prior to noticing these ear symptoms. No trauma. No ear drainage. No constitutional symptoms. Her external canals completely clear, tympanic membrane looks good, the overall head and neck exam is normal. No TMJ tenderness. I think this is likely related to eustachian tube dysfunction. We will do oral decongestant, intranasal Flonase , she will chew gum frequently. If not better in a week or 2 we can try some oral steroids as [place]

## 2024-03-30 NOTE — Progress Notes (Signed)
    Procedures performed today:    None.  Independent interpretation of notes and tests performed by another provider:   None.  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 Hearing is somewhat blunted, no tinnitus, no dizziness or vertiginous symptoms. She has noted some allergy symptoms such as rhinitis prior to noticing these ear symptoms. No trauma. No ear drainage. No constitutional symptoms. Her external canals completely clear, tympanic membrane looks good, the overall head and neck exam is normal. No TMJ tenderness. I think this is likely related to eustachian tube dysfunction. We will do oral decongestant, intranasal Flonase , she will chew gum frequently. If not better in a week or 2 we can try some oral steroids as she does have a [place] the [place].    ____________________________________________ Joselyn Nicely. Sandy Crumb, M.D., ABFM., CAQSM., AME. Primary Care and Sports Medicine [facility]  Adjunct Professor of [facility] Medicine  [employer]  Restaurant manager, fast food

## 2024-04-09 DIAGNOSIS — L821 Other seborrheic keratosis: Secondary | ICD-10-CM | POA: Diagnosis not present

## 2024-04-09 DIAGNOSIS — L814 Other melanin hyperpigmentation: Secondary | ICD-10-CM | POA: Diagnosis not present

## 2024-04-09 DIAGNOSIS — L570 Actinic keratosis: Secondary | ICD-10-CM | POA: Diagnosis not present

## 2024-04-09 DIAGNOSIS — L578 Other skin changes due to chronic exposure to nonionizing radiation: Secondary | ICD-10-CM | POA: Diagnosis not present

## 2024-04-09 DIAGNOSIS — W908XXD Exposure to other nonionizing radiation, subsequent encounter: Secondary | ICD-10-CM | POA: Diagnosis not present

## 2024-04-27 ENCOUNTER — Other Ambulatory Visit: Payer: Self-pay | Admitting: Family Medicine

## 2024-04-27 ENCOUNTER — Other Ambulatory Visit (HOSPITAL_BASED_OUTPATIENT_CLINIC_OR_DEPARTMENT_OTHER): Payer: Self-pay

## 2024-04-27 DIAGNOSIS — G43919 Migraine, unspecified, intractable, without status migrainosus: Secondary | ICD-10-CM

## 2024-04-30 ENCOUNTER — Other Ambulatory Visit (HOSPITAL_BASED_OUTPATIENT_CLINIC_OR_DEPARTMENT_OTHER): Payer: Self-pay

## 2024-04-30 ENCOUNTER — Other Ambulatory Visit: Payer: Self-pay

## 2024-04-30 MED ORDER — OMEPRAZOLE 40 MG PO CPDR
40.0000 mg | DELAYED_RELEASE_CAPSULE | Freq: Two times a day (BID) | ORAL | 1 refills | Status: AC
  Filled 2024-04-30: qty 180, 90d supply, fill #0

## 2024-05-01 ENCOUNTER — Other Ambulatory Visit (HOSPITAL_BASED_OUTPATIENT_CLINIC_OR_DEPARTMENT_OTHER): Payer: Self-pay

## 2024-05-01 MED ORDER — SUMATRIPTAN SUCCINATE 100 MG PO TABS
100.0000 mg | ORAL_TABLET | ORAL | 5 refills | Status: AC | PRN
  Filled 2024-05-01: qty 10, 30d supply, fill #0
  Filled 2024-06-11: qty 9, 30d supply, fill #1
  Filled 2024-06-11: qty 10, 30d supply, fill #1
  Filled 2024-07-19: qty 9, 30d supply, fill #2
  Filled 2024-08-17: qty 9, 30d supply, fill #3
  Filled 2024-09-26: qty 9, 30d supply, fill #4
  Filled 2024-10-29: qty 9, 30d supply, fill #5

## 2024-05-01 NOTE — Telephone Encounter (Signed)
 Pt needs to schedule appt with PCP for either physical or medication follow-up. Not seen (non-acute) since 2024. Thx

## 2024-05-01 NOTE — Telephone Encounter (Signed)
 Called patient, LVM to call office to schedule appt, thanks.

## 2024-05-08 ENCOUNTER — Other Ambulatory Visit (HOSPITAL_BASED_OUTPATIENT_CLINIC_OR_DEPARTMENT_OTHER): Payer: Self-pay

## 2024-05-08 DIAGNOSIS — K2210 Ulcer of esophagus without bleeding: Secondary | ICD-10-CM | POA: Diagnosis not present

## 2024-05-08 DIAGNOSIS — Z9884 Bariatric surgery status: Secondary | ICD-10-CM | POA: Diagnosis not present

## 2024-05-08 DIAGNOSIS — R1310 Dysphagia, unspecified: Secondary | ICD-10-CM | POA: Diagnosis not present

## 2024-05-08 DIAGNOSIS — D509 Iron deficiency anemia, unspecified: Secondary | ICD-10-CM | POA: Diagnosis not present

## 2024-05-08 DIAGNOSIS — K5900 Constipation, unspecified: Secondary | ICD-10-CM | POA: Diagnosis not present

## 2024-05-08 MED ORDER — LINZESS 72 MCG PO CAPS
72.0000 ug | ORAL_CAPSULE | Freq: Every day | ORAL | 5 refills | Status: DC
  Filled 2024-05-08 – 2024-06-11 (×2): qty 30, 30d supply, fill #0

## 2024-05-10 ENCOUNTER — Other Ambulatory Visit (HOSPITAL_BASED_OUTPATIENT_CLINIC_OR_DEPARTMENT_OTHER): Payer: Self-pay

## 2024-05-14 ENCOUNTER — Ambulatory Visit: Admitting: Family Medicine

## 2024-06-11 ENCOUNTER — Other Ambulatory Visit: Payer: Self-pay

## 2024-06-11 ENCOUNTER — Other Ambulatory Visit (HOSPITAL_BASED_OUTPATIENT_CLINIC_OR_DEPARTMENT_OTHER): Payer: Self-pay

## 2024-06-11 ENCOUNTER — Other Ambulatory Visit: Payer: Self-pay | Admitting: Family Medicine

## 2024-06-11 DIAGNOSIS — F411 Generalized anxiety disorder: Secondary | ICD-10-CM

## 2024-06-11 MED ORDER — LORAZEPAM 0.5 MG PO TABS
0.5000 mg | ORAL_TABLET | Freq: Two times a day (BID) | ORAL | 0 refills | Status: DC | PRN
  Filled 2024-06-11: qty 30, 15d supply, fill #0

## 2024-06-12 ENCOUNTER — Other Ambulatory Visit (HOSPITAL_BASED_OUTPATIENT_CLINIC_OR_DEPARTMENT_OTHER): Payer: Self-pay

## 2024-06-19 ENCOUNTER — Encounter: Payer: Self-pay | Admitting: Sports Medicine

## 2024-07-06 ENCOUNTER — Other Ambulatory Visit (HOSPITAL_COMMUNITY): Payer: Self-pay

## 2024-07-06 ENCOUNTER — Telehealth: Payer: Self-pay

## 2024-07-06 NOTE — Telephone Encounter (Signed)
 Pharmacy Patient Advocate Encounter  Received notification from [facility] that Prior Authorization for Nurtec 75mg  tabs has been APPROVED from 07/06/24 to 07/05/25   PA #/Case ID/Reference #: [id number]

## 2024-07-06 NOTE — Telephone Encounter (Signed)
 Pharmacy Patient Advocate Encounter   Received notification from CoverMyMeds that prior authorization for Nurtec 75mg  tabs is due for renewal.   Insurance verification completed.   The patient is insured through [facility].  Action: PA required; PA submitted to above mentioned insurance via Latent Key/confirmation #/EOC [id number] Status is pending

## 2024-07-10 ENCOUNTER — Inpatient Hospital Stay: Attending: Family

## 2024-07-10 ENCOUNTER — Inpatient Hospital Stay: Admitting: Family

## 2024-07-19 ENCOUNTER — Other Ambulatory Visit (HOSPITAL_BASED_OUTPATIENT_CLINIC_OR_DEPARTMENT_OTHER): Payer: Self-pay

## 2024-07-19 ENCOUNTER — Other Ambulatory Visit: Payer: Self-pay | Admitting: Family Medicine

## 2024-07-19 DIAGNOSIS — G479 Sleep disorder, unspecified: Secondary | ICD-10-CM

## 2024-07-19 MED ORDER — HYDROXYZINE PAMOATE 25 MG PO CAPS
25.0000 mg | ORAL_CAPSULE | Freq: Every evening | ORAL | 3 refills | Status: AC | PRN
  Filled 2024-07-19: qty 60, 30d supply, fill #0
  Filled 2024-08-17: qty 60, 30d supply, fill #1
  Filled 2024-09-26: qty 60, 30d supply, fill #2
  Filled 2024-10-29: qty 60, 30d supply, fill #3

## 2024-08-10 ENCOUNTER — Other Ambulatory Visit: Payer: Self-pay | Admitting: Medical Genetics

## 2024-08-10 DIAGNOSIS — Z006 Encounter for examination for normal comparison and control in clinical research program: Secondary | ICD-10-CM

## 2024-08-13 DIAGNOSIS — K5900 Constipation, unspecified: Secondary | ICD-10-CM | POA: Diagnosis not present

## 2024-08-13 DIAGNOSIS — K2210 Ulcer of esophagus without bleeding: Secondary | ICD-10-CM | POA: Diagnosis not present

## 2024-08-13 DIAGNOSIS — Z9884 Bariatric surgery status: Secondary | ICD-10-CM | POA: Diagnosis not present

## 2024-08-17 ENCOUNTER — Ambulatory Visit (INDEPENDENT_AMBULATORY_CARE_PROVIDER_SITE_OTHER): Admitting: Family Medicine

## 2024-08-17 ENCOUNTER — Encounter: Payer: Self-pay | Admitting: Family Medicine

## 2024-08-17 ENCOUNTER — Other Ambulatory Visit: Payer: Self-pay | Admitting: Family Medicine

## 2024-08-17 ENCOUNTER — Ambulatory Visit (INDEPENDENT_AMBULATORY_CARE_PROVIDER_SITE_OTHER)

## 2024-08-17 ENCOUNTER — Other Ambulatory Visit (HOSPITAL_BASED_OUTPATIENT_CLINIC_OR_DEPARTMENT_OTHER): Payer: Self-pay

## 2024-08-17 VITALS — BP 135/69 | HR 68

## 2024-08-17 DIAGNOSIS — Z7689 Persons encountering health services in other specified circumstances: Secondary | ICD-10-CM | POA: Diagnosis not present

## 2024-08-17 DIAGNOSIS — M79641 Pain in right hand: Secondary | ICD-10-CM

## 2024-08-17 DIAGNOSIS — M25532 Pain in left wrist: Secondary | ICD-10-CM

## 2024-08-17 DIAGNOSIS — M25531 Pain in right wrist: Secondary | ICD-10-CM

## 2024-08-17 DIAGNOSIS — M2550 Pain in unspecified joint: Secondary | ICD-10-CM | POA: Diagnosis not present

## 2024-08-17 DIAGNOSIS — M25431 Effusion, right wrist: Secondary | ICD-10-CM

## 2024-08-17 DIAGNOSIS — M25432 Effusion, left wrist: Secondary | ICD-10-CM

## 2024-08-17 DIAGNOSIS — M79642 Pain in left hand: Secondary | ICD-10-CM

## 2024-08-17 DIAGNOSIS — M79643 Pain in unspecified hand: Secondary | ICD-10-CM | POA: Diagnosis not present

## 2024-08-17 DIAGNOSIS — R5383 Other fatigue: Secondary | ICD-10-CM

## 2024-08-17 DIAGNOSIS — F411 Generalized anxiety disorder: Secondary | ICD-10-CM

## 2024-08-17 DIAGNOSIS — G8929 Other chronic pain: Secondary | ICD-10-CM | POA: Diagnosis not present

## 2024-08-17 DIAGNOSIS — M7989 Other specified soft tissue disorders: Secondary | ICD-10-CM | POA: Diagnosis not present

## 2024-08-17 MED ORDER — PHENTERMINE HCL 15 MG PO CAPS
15.0000 mg | ORAL_CAPSULE | ORAL | 0 refills | Status: AC
  Filled 2024-08-17: qty 90, 90d supply, fill #0

## 2024-08-17 NOTE — Addendum Note (Signed)
 Addended by: FREYA BASCOM CROME on: 08/17/2024 12:49 PM   Modules accepted: Orders

## 2024-08-17 NOTE — Assessment & Plan Note (Signed)
 We discussed phentermine  as an option at least for short-term.  Since GLP-1 is not financially accessible even though it has been extremely helpful in maintaining her weight.  And has had a fairly rapid weight gain over the last 5 months.  Will start with low-dose phentermine .  Monitor for increased heart rate and blood pressure call if any concerns or problems.  Make sure to take in the morning.

## 2024-08-17 NOTE — Progress Notes (Signed)
 Acute Office Visit  Patient ID: Rose Long, female    DOB: 09-06-1961, 63 y.o.   MRN: [id number]  PCP: Alvan Dorothyann BIRCH, MD  No chief complaint on file.   Subjective:     HPI  Reports 3 to 4 weeks of not feeling well particularly complaining of joint pain in her ankles, feet wrists hands even her low back and neck.  It is been difficult to even just go up and down stairs in the last couple of weeks.  She has had some joint swelling particularly in her fingers difficulty getting rings on and even in her wrists.  No recent injury or trauma.  No medication changes.  She has gained about 35 pounds since the spring when she had to come off of her GLP-1.  Prior history of bariatric surgery.  Fatigued.  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  Her mother and her brother both were diagnosed with rheumatoid.  No unusual fevers.  She does have night sweats but those are not new.    ROS     Objective:    There were no vitals taken for this visit.   Physical Exam Vitals and nursing note reviewed.  Constitutional:      Appearance: Normal appearance.  HENT:     Head: Normocephalic and atraumatic.  Eyes:     Conjunctiva/sclera: Conjunctivae normal.  Pulmonary:     Effort: Pulmonary effort is normal.  Musculoskeletal:     Comments: Both wrists are tender on exam today.  Positive squeeze test on the right hand.  No significant synovitis today she is tender on the PIP on the left index finger.  Mildly tender over both elbows today.  Skin:    General: Skin is warm and dry.  Neurological:     Mental Status: She is alert.  Psychiatric:        Mood and Affect: Mood normal.       No results found for any visits on 08/17/24.     Assessment & Plan:   Problem List Items Addressed This Visit       Other   Other fatigue   Encounter for weight management   We discussed phentermine  as an option at least for short-term.  Since GLP-1 is not financially  accessible even though it has been extremely helpful in maintaining her weight.  And has had a fairly rapid weight gain over the last 5 months.  Will start with low-dose phentermine .  Monitor for increased heart rate and blood pressure call if any concerns or problems.  Make sure to take in the morning.      Other Visit Diagnoses       Polyarthralgia    -  Primary   Relevant Medications   phentermine  15 MG capsule   Other Relevant Orders   Cyclic citrul peptide antibody, IgG   DG Hand 2 View Right   DG Hand 2 View Left   TSH   Sedimentation rate   Rheumatoid factor   CBC with Differential/Platelet   C-reactive protein   ANA   CYCLIC CITRUL PEPTIDE ANTIBODY, IGG/IGA     Pain in both hands       Relevant Medications   phentermine  15 MG capsule   Other Relevant Orders   Cyclic citrul peptide antibody, IgG   DG Hand 2 View Right   DG Hand 2 View Left   TSH   Sedimentation rate   Rheumatoid factor   CBC  with Differential/Platelet   C-reactive protein   ANA   CYCLIC CITRUL PEPTIDE ANTIBODY, IGG/IGA     Pain in both wrists       Relevant Medications   phentermine  15 MG capsule   Other Relevant Orders   Cyclic citrul peptide antibody, IgG   DG Hand 2 View Right   DG Hand 2 View Left   TSH   Sedimentation rate   Rheumatoid factor   CBC with Differential/Platelet   C-reactive protein   ANA   CYCLIC CITRUL PEPTIDE ANTIBODY, IGG/IGA      With prior history of JRA as a child I am concerned about resurgence or flare of rheumatoid as an adult.  Will get additional labs as well as plain films of both hands today.  We do not not have the ability to do ultrasound of the foot here today.  As far as the fatigue is concerned also recommend doing some additional labs including evaluating for thyroid  disorder anemia etc.  Will call with results once available.      Meds ordered this encounter  Medications   phentermine  15 MG capsule    Sig: Take 1 capsule (15 mg total) by mouth  every morning.    Dispense:  90 capsule    Refill:  0    No follow-ups on file.  Dorothyann Byars, MD [facility] Primary Care & Sports Medicine at [facility]

## 2024-08-20 ENCOUNTER — Other Ambulatory Visit (HOSPITAL_BASED_OUTPATIENT_CLINIC_OR_DEPARTMENT_OTHER): Payer: Self-pay

## 2024-08-20 ENCOUNTER — Ambulatory Visit: Payer: Self-pay | Admitting: Family Medicine

## 2024-08-20 MED ORDER — LORAZEPAM 0.5 MG PO TABS
0.5000 mg | ORAL_TABLET | Freq: Two times a day (BID) | ORAL | 0 refills | Status: DC | PRN
  Filled 2024-08-20: qty 30, 15d supply, fill #0

## 2024-08-20 NOTE — Progress Notes (Signed)
 HI Rose Long,  Some of your labs are back.  The inflammatory marker called a sed rate is a little elevated as above 40 so out of the normal range.  Though typically with more autoimmune processes we usually see it above 80.  Your thyroid  level looks great.  Negative rheumatoid factor.  CRP which is a different anti-inflammatory marker is normal.  Lupus test is negative.  Blood Count looks great no sign of anemia.  Still awaiting the anti-CCP test.

## 2024-08-21 ENCOUNTER — Other Ambulatory Visit: Payer: Self-pay

## 2024-08-22 ENCOUNTER — Ambulatory Visit: Payer: Self-pay | Admitting: Family Medicine

## 2024-08-22 LAB — RHEUMATOID FACTOR: Rheumatoid fact SerPl-aCnc: <10 [IU]/mL (ref <14.0)

## 2024-08-22 LAB — TSH: TSH: 2.41 u[IU]/mL (ref 0.450–4.500)

## 2024-08-22 LAB — CBC WITH DIFFERENTIAL/PLATELET
Basophils Absolute: 0.1 x10E3/uL (ref 0.0–0.2)
Basos: 1 %
EOS (ABSOLUTE): 0.1 x10E3/uL (ref 0.0–0.4)
Eos: 1 %
Hematocrit: 42.8 % (ref 34.0–46.6)
Hemoglobin: 13.9 g/dL (ref 11.1–15.9)
Immature Grans (Abs): 0 x10E3/uL (ref 0.0–0.1)
Immature Granulocytes: 0 %
Lymphocytes Absolute: 2.3 x10E3/uL (ref 0.7–3.1)
Lymphs: 34 %
MCH: 28.4 pg (ref 26.6–33.0)
MCHC: 32.5 g/dL (ref 31.5–35.7)
MCV: 87 fL (ref 79–97)
Monocytes Absolute: 0.4 x10E3/uL (ref 0.1–0.9)
Monocytes: 5 %
Neutrophils Absolute: 3.9 x10E3/uL (ref 1.4–7.0)
Neutrophils: 59 %
Platelets: 335 x10E3/uL (ref 150–450)
RBC: 4.9 x10E6/uL (ref 3.77–5.28)
RDW: 12.9 % (ref 11.7–15.4)
WBC: 6.7 x10E3/uL (ref 3.4–10.8)

## 2024-08-22 LAB — CYCLIC CITRUL PEPTIDE ANTIBODY, IGG/IGA: Cyclic Citrullin Peptide Ab: 8 U (ref 0–19)

## 2024-08-22 LAB — SEDIMENTATION RATE: Sed Rate: 50 mm/h — ABNORMAL HIGH (ref 0–40)

## 2024-08-22 LAB — C-REACTIVE PROTEIN: CRP: 3 mg/L (ref 0–10)

## 2024-08-22 LAB — ANA

## 2024-08-22 NOTE — Progress Notes (Signed)
 Hi Rose Long, the right hand x-rays look okay no distinct rheumatoid findings which is great.  No significant arthritis which is also reassuring and the soft tissues look good 2.

## 2024-08-22 NOTE — Progress Notes (Signed)
 Hi Rose Long, x-rays of the left hand also look good no significant bony abnormalities and no indication of rheumatoid.  And soft tissues look good.

## 2024-08-22 NOTE — Progress Notes (Signed)
 Good news!  The anti-CCP which is that more sensitive test for rheumatoid is negative which is great news.  I am not sure if some of this is may be related to some fluid retention and the recent weight gain or maybe even could be more viral related and it is causing some arthralgias.  Lets keep an eye on it over the next couple of weeks and see how you are feeling.  Okay to use Tylenol  as needed for aches and pains.  We could consider a short course of prednisone  if you feel like that might be helpful for you as well.

## 2024-09-26 ENCOUNTER — Other Ambulatory Visit (HOSPITAL_COMMUNITY): Payer: Self-pay

## 2024-09-26 ENCOUNTER — Other Ambulatory Visit: Payer: Self-pay

## 2024-09-26 ENCOUNTER — Other Ambulatory Visit (HOSPITAL_BASED_OUTPATIENT_CLINIC_OR_DEPARTMENT_OTHER): Payer: Self-pay

## 2024-09-26 MED ORDER — IBSRELA 50 MG PO TABS
50.0000 mg | ORAL_TABLET | Freq: Two times a day (BID) | ORAL | 5 refills | Status: AC
  Filled 2024-09-26: qty 60, 30d supply, fill #0
  Filled 2024-10-29: qty 60, 30d supply, fill #1

## 2024-10-04 ENCOUNTER — Other Ambulatory Visit: Payer: Self-pay | Admitting: *Deleted

## 2024-10-04 DIAGNOSIS — F411 Generalized anxiety disorder: Secondary | ICD-10-CM

## 2024-10-04 MED ORDER — SERTRALINE HCL 100 MG PO TABS: 200.0000 mg | ORAL_TABLET | Freq: Every day | ORAL | 1 refills | Status: AC

## 2024-10-04 NOTE — Telephone Encounter (Signed)
 Pt reports that she increased her Sertraline  to 200 mg for the past 10 days and she is now out of medication. She would like to get a new prescription for 200 mg sent into [facility] on [address] [place].

## 2024-10-04 NOTE — Telephone Encounter (Signed)
 Meds ordered this encounter  Medications   sertraline  (ZOLOFT ) 100 MG tablet    Sig: Take 2 tablets (200 mg total) by mouth daily.    Dispense:  180 tablet    Refill:  1

## 2024-10-29 ENCOUNTER — Other Ambulatory Visit: Payer: Self-pay | Admitting: Family Medicine

## 2024-10-29 ENCOUNTER — Other Ambulatory Visit (HOSPITAL_BASED_OUTPATIENT_CLINIC_OR_DEPARTMENT_OTHER): Payer: Self-pay

## 2024-10-29 DIAGNOSIS — F411 Generalized anxiety disorder: Secondary | ICD-10-CM

## 2024-11-02 ENCOUNTER — Other Ambulatory Visit (HOSPITAL_BASED_OUTPATIENT_CLINIC_OR_DEPARTMENT_OTHER): Payer: Self-pay

## 2024-11-02 MED ORDER — LORAZEPAM 0.5 MG PO TABS
0.5000 mg | ORAL_TABLET | Freq: Two times a day (BID) | ORAL | 0 refills | Status: AC | PRN
  Filled 2024-11-02: qty 30, 15d supply, fill #0

## 2024-11-02 NOTE — Telephone Encounter (Signed)
 LORAZEPAM  Last OV: 08/17/24 Next OV: no appointment scheduled Last RF: 08/20/24
